# Patient Record
Sex: Female | Born: 1945 | Race: White | Hispanic: No | Smoking: Former smoker
Health system: Southern US, Community
[De-identification: ages and names within clinical notes are randomized; demographics above are authoritative.]

## PROBLEM LIST (undated history)

## (undated) DIAGNOSIS — Z9289 Personal history of other medical treatment: Secondary | ICD-10-CM

## (undated) DIAGNOSIS — I779 Disorder of arteries and arterioles, unspecified: Secondary | ICD-10-CM

## (undated) DIAGNOSIS — M1990 Unspecified osteoarthritis, unspecified site: Secondary | ICD-10-CM

## (undated) DIAGNOSIS — E039 Hypothyroidism, unspecified: Secondary | ICD-10-CM

## (undated) DIAGNOSIS — G4700 Insomnia, unspecified: Secondary | ICD-10-CM

## (undated) DIAGNOSIS — IMO0002 Reserved for concepts with insufficient information to code with codable children: Secondary | ICD-10-CM

## (undated) DIAGNOSIS — E782 Mixed hyperlipidemia: Secondary | ICD-10-CM

## (undated) DIAGNOSIS — I472 Ventricular tachycardia, unspecified: Secondary | ICD-10-CM

## (undated) DIAGNOSIS — F419 Anxiety disorder, unspecified: Secondary | ICD-10-CM

## (undated) DIAGNOSIS — G2581 Restless legs syndrome: Secondary | ICD-10-CM

## (undated) DIAGNOSIS — R55 Syncope and collapse: Secondary | ICD-10-CM

## (undated) DIAGNOSIS — I739 Peripheral vascular disease, unspecified: Secondary | ICD-10-CM

## (undated) DIAGNOSIS — N183 Chronic kidney disease, stage 3 unspecified: Secondary | ICD-10-CM

## (undated) DIAGNOSIS — I639 Cerebral infarction, unspecified: Secondary | ICD-10-CM

## (undated) DIAGNOSIS — I214 Non-ST elevation (NSTEMI) myocardial infarction: Secondary | ICD-10-CM

## (undated) DIAGNOSIS — F329 Major depressive disorder, single episode, unspecified: Secondary | ICD-10-CM

## (undated) DIAGNOSIS — K759 Inflammatory liver disease, unspecified: Secondary | ICD-10-CM

## (undated) DIAGNOSIS — R001 Bradycardia, unspecified: Secondary | ICD-10-CM

## (undated) DIAGNOSIS — F32A Depression, unspecified: Secondary | ICD-10-CM

## (undated) DIAGNOSIS — I4729 Other ventricular tachycardia: Secondary | ICD-10-CM

## (undated) DIAGNOSIS — H9190 Unspecified hearing loss, unspecified ear: Secondary | ICD-10-CM

## (undated) DIAGNOSIS — J449 Chronic obstructive pulmonary disease, unspecified: Secondary | ICD-10-CM

## (undated) DIAGNOSIS — M810 Age-related osteoporosis without current pathological fracture: Secondary | ICD-10-CM

## (undated) DIAGNOSIS — H269 Unspecified cataract: Secondary | ICD-10-CM

## (undated) DIAGNOSIS — I2510 Atherosclerotic heart disease of native coronary artery without angina pectoris: Secondary | ICD-10-CM

## (undated) DIAGNOSIS — Z950 Presence of cardiac pacemaker: Secondary | ICD-10-CM

## (undated) DIAGNOSIS — K219 Gastro-esophageal reflux disease without esophagitis: Secondary | ICD-10-CM

## (undated) DIAGNOSIS — J189 Pneumonia, unspecified organism: Secondary | ICD-10-CM

## (undated) DIAGNOSIS — H3530 Unspecified macular degeneration: Secondary | ICD-10-CM

## (undated) DIAGNOSIS — C539 Malignant neoplasm of cervix uteri, unspecified: Secondary | ICD-10-CM

## (undated) HISTORY — PX: ANTERIOR CERVICAL DECOMP/DISCECTOMY FUSION: SHX1161

## (undated) HISTORY — DX: Reserved for concepts with insufficient information to code with codable children: IMO0002

## (undated) HISTORY — DX: Hypothyroidism, unspecified: E03.9

## (undated) HISTORY — DX: Restless legs syndrome: G25.81

## (undated) HISTORY — DX: Insomnia, unspecified: G47.00

## (undated) HISTORY — DX: Chronic kidney disease, stage 3 unspecified: N18.30

## (undated) HISTORY — DX: Cerebral infarction, unspecified: I63.9

## (undated) HISTORY — DX: Major depressive disorder, single episode, unspecified: F32.9

## (undated) HISTORY — DX: Chronic obstructive pulmonary disease, unspecified: J44.9

## (undated) HISTORY — PX: COLONOSCOPY: SHX174

## (undated) HISTORY — PX: LAPAROSCOPIC CHOLECYSTECTOMY: SUR755

## (undated) HISTORY — DX: Age-related osteoporosis without current pathological fracture: M81.0

## (undated) HISTORY — DX: Chronic kidney disease, stage 3 (moderate): N18.3

## (undated) HISTORY — DX: Unspecified cataract: H26.9

## (undated) HISTORY — DX: Gastro-esophageal reflux disease without esophagitis: K21.9

## (undated) HISTORY — DX: Syncope and collapse: R55

## (undated) HISTORY — DX: Atherosclerotic heart disease of native coronary artery without angina pectoris: I25.10

## (undated) HISTORY — DX: Mixed hyperlipidemia: E78.2

## (undated) HISTORY — DX: Unspecified hearing loss, unspecified ear: H91.90

## (undated) HISTORY — PX: KNEE ARTHROSCOPY: SUR90

## (undated) HISTORY — PX: BACK SURGERY: SHX140

## (undated) HISTORY — DX: Other ventricular tachycardia: I47.29

## (undated) HISTORY — DX: Anxiety disorder, unspecified: F41.9

## (undated) HISTORY — PX: TUBAL LIGATION: SHX77

## (undated) HISTORY — DX: Ventricular tachycardia, unspecified: I47.20

## (undated) HISTORY — PX: ABDOMINAL HYSTERECTOMY: SHX81

## (undated) HISTORY — DX: Unspecified macular degeneration: H35.30

## (undated) HISTORY — DX: Ventricular tachycardia: I47.2

## (undated) HISTORY — DX: Depression, unspecified: F32.A

---

## 1966-09-27 DIAGNOSIS — K759 Inflammatory liver disease, unspecified: Secondary | ICD-10-CM

## 1966-09-27 HISTORY — DX: Inflammatory liver disease, unspecified: K75.9

## 1998-02-04 ENCOUNTER — Other Ambulatory Visit: Admission: RE | Admit: 1998-02-04 | Discharge: 1998-02-04 | Payer: Self-pay | Admitting: Obstetrics and Gynecology

## 1998-07-15 ENCOUNTER — Inpatient Hospital Stay (HOSPITAL_COMMUNITY): Admission: RE | Admit: 1998-07-15 | Discharge: 1998-07-16 | Payer: Self-pay | Admitting: Orthopaedic Surgery

## 1999-02-06 ENCOUNTER — Other Ambulatory Visit: Admission: RE | Admit: 1999-02-06 | Discharge: 1999-02-06 | Payer: Self-pay | Admitting: Obstetrics and Gynecology

## 2000-05-31 ENCOUNTER — Other Ambulatory Visit: Admission: RE | Admit: 2000-05-31 | Discharge: 2000-05-31 | Payer: Self-pay | Admitting: Obstetrics and Gynecology

## 2001-04-24 ENCOUNTER — Inpatient Hospital Stay (HOSPITAL_COMMUNITY): Admission: RE | Admit: 2001-04-24 | Discharge: 2001-04-25 | Payer: Self-pay | Admitting: Orthopaedic Surgery

## 2002-09-04 ENCOUNTER — Encounter: Admission: RE | Admit: 2002-09-04 | Discharge: 2002-09-04 | Payer: Self-pay | Admitting: Orthopaedic Surgery

## 2003-03-18 ENCOUNTER — Ambulatory Visit (HOSPITAL_BASED_OUTPATIENT_CLINIC_OR_DEPARTMENT_OTHER): Admission: RE | Admit: 2003-03-18 | Discharge: 2003-03-18 | Payer: Self-pay | Admitting: Orthopedic Surgery

## 2004-03-09 ENCOUNTER — Ambulatory Visit (HOSPITAL_COMMUNITY): Admission: RE | Admit: 2004-03-09 | Discharge: 2004-03-09 | Payer: Self-pay | Admitting: Orthopedic Surgery

## 2004-03-09 ENCOUNTER — Ambulatory Visit (HOSPITAL_BASED_OUTPATIENT_CLINIC_OR_DEPARTMENT_OTHER): Admission: RE | Admit: 2004-03-09 | Discharge: 2004-03-09 | Payer: Self-pay | Admitting: Orthopedic Surgery

## 2004-07-10 ENCOUNTER — Ambulatory Visit (HOSPITAL_COMMUNITY): Admission: RE | Admit: 2004-07-10 | Discharge: 2004-07-10 | Payer: Self-pay | Admitting: Family Medicine

## 2004-11-21 ENCOUNTER — Emergency Department (HOSPITAL_COMMUNITY): Admission: EM | Admit: 2004-11-21 | Discharge: 2004-11-21 | Payer: Self-pay | Admitting: Emergency Medicine

## 2004-11-23 ENCOUNTER — Ambulatory Visit: Payer: Self-pay | Admitting: Orthopedic Surgery

## 2005-05-10 ENCOUNTER — Ambulatory Visit (HOSPITAL_COMMUNITY): Admission: RE | Admit: 2005-05-10 | Discharge: 2005-05-11 | Payer: Self-pay | Admitting: Orthopaedic Surgery

## 2006-08-27 HISTORY — PX: CORONARY ANGIOPLASTY WITH STENT PLACEMENT: SHX49

## 2006-09-18 ENCOUNTER — Inpatient Hospital Stay (HOSPITAL_COMMUNITY): Admission: EM | Admit: 2006-09-18 | Discharge: 2006-09-21 | Payer: Self-pay | Admitting: Internal Medicine

## 2006-09-18 ENCOUNTER — Ambulatory Visit: Payer: Self-pay | Admitting: *Deleted

## 2006-09-23 ENCOUNTER — Ambulatory Visit: Payer: Self-pay | Admitting: Cardiology

## 2006-09-27 HISTORY — PX: CARDIAC CATHETERIZATION: SHX172

## 2006-10-04 ENCOUNTER — Encounter (INDEPENDENT_AMBULATORY_CARE_PROVIDER_SITE_OTHER): Payer: Self-pay | Admitting: Family Medicine

## 2006-10-10 ENCOUNTER — Ambulatory Visit: Payer: Self-pay | Admitting: Family Medicine

## 2006-10-11 ENCOUNTER — Encounter: Payer: Self-pay | Admitting: Family Medicine

## 2006-10-11 ENCOUNTER — Ambulatory Visit: Payer: Self-pay | Admitting: Cardiology

## 2006-10-11 DIAGNOSIS — F32A Depression, unspecified: Secondary | ICD-10-CM | POA: Insufficient documentation

## 2006-10-11 DIAGNOSIS — C539 Malignant neoplasm of cervix uteri, unspecified: Secondary | ICD-10-CM | POA: Insufficient documentation

## 2006-10-11 DIAGNOSIS — M1990 Unspecified osteoarthritis, unspecified site: Secondary | ICD-10-CM | POA: Insufficient documentation

## 2006-10-11 DIAGNOSIS — F329 Major depressive disorder, single episode, unspecified: Secondary | ICD-10-CM

## 2006-10-11 DIAGNOSIS — M545 Low back pain: Secondary | ICD-10-CM | POA: Insufficient documentation

## 2006-10-11 DIAGNOSIS — E785 Hyperlipidemia, unspecified: Secondary | ICD-10-CM | POA: Insufficient documentation

## 2006-10-11 DIAGNOSIS — F419 Anxiety disorder, unspecified: Secondary | ICD-10-CM

## 2006-10-11 DIAGNOSIS — I252 Old myocardial infarction: Secondary | ICD-10-CM

## 2006-10-11 DIAGNOSIS — I10 Essential (primary) hypertension: Secondary | ICD-10-CM

## 2006-10-11 DIAGNOSIS — K219 Gastro-esophageal reflux disease without esophagitis: Secondary | ICD-10-CM | POA: Insufficient documentation

## 2006-10-11 LAB — CONVERTED CEMR LAB
BUN: 8 mg/dL (ref 6–23)
CO2: 32 meq/L (ref 19–32)
Calcium: 9.4 mg/dL (ref 8.4–10.5)
Chloride: 99 meq/L (ref 96–112)
Creatinine, Ser: 0.9 mg/dL (ref 0.4–1.2)
Potassium: 4.5 meq/L (ref 3.5–5.1)

## 2006-10-13 ENCOUNTER — Encounter (INDEPENDENT_AMBULATORY_CARE_PROVIDER_SITE_OTHER): Payer: Self-pay | Admitting: Family Medicine

## 2006-10-28 HISTORY — PX: CORONARY ARTERY BYPASS GRAFT: SHX141

## 2006-10-29 DIAGNOSIS — Z9289 Personal history of other medical treatment: Secondary | ICD-10-CM

## 2006-10-29 HISTORY — DX: Personal history of other medical treatment: Z92.89

## 2006-11-04 ENCOUNTER — Ambulatory Visit: Payer: Self-pay | Admitting: Family Medicine

## 2006-11-04 ENCOUNTER — Telehealth (INDEPENDENT_AMBULATORY_CARE_PROVIDER_SITE_OTHER): Payer: Self-pay | Admitting: Family Medicine

## 2006-11-04 DIAGNOSIS — R5383 Other fatigue: Secondary | ICD-10-CM

## 2006-11-04 DIAGNOSIS — R5381 Other malaise: Secondary | ICD-10-CM | POA: Insufficient documentation

## 2006-11-04 DIAGNOSIS — H1130 Conjunctival hemorrhage, unspecified eye: Secondary | ICD-10-CM | POA: Insufficient documentation

## 2006-11-05 ENCOUNTER — Encounter (INDEPENDENT_AMBULATORY_CARE_PROVIDER_SITE_OTHER): Payer: Self-pay | Admitting: Family Medicine

## 2006-11-09 LAB — CONVERTED CEMR LAB
AST: 19 units/L (ref 0–37)
Albumin: 4.1 g/dL (ref 3.5–5.2)
Alkaline Phosphatase: 155 units/L — ABNORMAL HIGH (ref 39–117)
BUN: 16 mg/dL (ref 6–23)
Basophils Relative: 0 % (ref 0–1)
Creatinine, Ser: 0.96 mg/dL (ref 0.40–1.20)
Eosinophils Absolute: 0.1 10*3/uL (ref 0.0–0.7)
Eosinophils Relative: 2 % (ref 0–5)
Glucose, Bld: 115 mg/dL — ABNORMAL HIGH (ref 70–99)
HCT: 40.8 % (ref 36.0–46.0)
Hemoglobin: 14.1 g/dL (ref 12.0–15.0)
Lymphs Abs: 2.2 10*3/uL (ref 0.7–3.3)
MCHC: 34.6 g/dL (ref 30.0–36.0)
MCV: 87 fL (ref 78.0–100.0)
Monocytes Absolute: 0.6 10*3/uL (ref 0.2–0.7)
Monocytes Relative: 9 % (ref 3–11)
Potassium: 4.1 meq/L (ref 3.5–5.3)
RBC: 4.69 M/uL (ref 3.87–5.11)
WBC: 6.7 10*3/uL (ref 4.0–10.5)

## 2006-11-11 ENCOUNTER — Ambulatory Visit: Payer: Self-pay | Admitting: Family Medicine

## 2006-11-11 DIAGNOSIS — R748 Abnormal levels of other serum enzymes: Secondary | ICD-10-CM | POA: Insufficient documentation

## 2006-11-17 ENCOUNTER — Ambulatory Visit: Payer: Self-pay | Admitting: Cardiology

## 2006-11-17 ENCOUNTER — Inpatient Hospital Stay (HOSPITAL_COMMUNITY): Admission: AD | Admit: 2006-11-17 | Discharge: 2006-12-12 | Payer: Self-pay | Admitting: Cardiology

## 2006-11-17 ENCOUNTER — Ambulatory Visit: Payer: Self-pay | Admitting: Pulmonary Disease

## 2006-11-18 ENCOUNTER — Ambulatory Visit: Payer: Self-pay | Admitting: Cardiothoracic Surgery

## 2006-11-22 ENCOUNTER — Ambulatory Visit: Payer: Self-pay | Admitting: Oncology

## 2006-11-23 ENCOUNTER — Telehealth (INDEPENDENT_AMBULATORY_CARE_PROVIDER_SITE_OTHER): Payer: Self-pay | Admitting: Family Medicine

## 2006-11-30 ENCOUNTER — Encounter: Payer: Self-pay | Admitting: Cardiology

## 2006-12-23 ENCOUNTER — Ambulatory Visit: Payer: Self-pay | Admitting: Cardiology

## 2006-12-30 ENCOUNTER — Encounter (INDEPENDENT_AMBULATORY_CARE_PROVIDER_SITE_OTHER): Payer: Self-pay | Admitting: Family Medicine

## 2006-12-30 ENCOUNTER — Ambulatory Visit: Payer: Self-pay | Admitting: Cardiothoracic Surgery

## 2007-01-02 ENCOUNTER — Encounter (HOSPITAL_COMMUNITY): Admission: RE | Admit: 2007-01-02 | Discharge: 2007-02-01 | Payer: Self-pay | Admitting: Cardiology

## 2007-01-12 ENCOUNTER — Ambulatory Visit: Payer: Self-pay | Admitting: Cardiology

## 2007-01-13 ENCOUNTER — Ambulatory Visit: Payer: Self-pay | Admitting: Family Medicine

## 2007-01-13 DIAGNOSIS — M899 Disorder of bone, unspecified: Secondary | ICD-10-CM | POA: Insufficient documentation

## 2007-01-13 DIAGNOSIS — M949 Disorder of cartilage, unspecified: Secondary | ICD-10-CM

## 2007-01-13 LAB — CONVERTED CEMR LAB
Cholesterol, target level: 200 mg/dL
HDL goal, serum: 40 mg/dL
LDL Goal: 100 mg/dL

## 2007-01-16 ENCOUNTER — Encounter (INDEPENDENT_AMBULATORY_CARE_PROVIDER_SITE_OTHER): Payer: Self-pay | Admitting: Family Medicine

## 2007-01-18 LAB — CONVERTED CEMR LAB
Alkaline Phosphatase: 164 units/L — ABNORMAL HIGH (ref 39–117)
BUN: 13 mg/dL (ref 6–23)
Creatinine, Ser: 0.82 mg/dL (ref 0.40–1.20)
Eosinophils Absolute: 0.3 10*3/uL (ref 0.0–0.7)
Eosinophils Relative: 4 % (ref 0–5)
Glucose, Bld: 77 mg/dL (ref 70–99)
HDL: 65 mg/dL (ref >39)
Hemoglobin: 13.1 g/dL (ref 12.0–15.0)
LDL Cholesterol: 66 mg/dL (ref 0–99)
Lymphs Abs: 2.6 10*3/uL (ref 0.7–3.3)
MCHC: 31.4 g/dL (ref 30.0–36.0)
Monocytes Absolute: 0.8 10*3/uL — ABNORMAL HIGH (ref 0.2–0.7)
Monocytes Relative: 13 % — ABNORMAL HIGH (ref 3–11)
RBC: 4.65 M/uL (ref 3.87–5.11)
RDW: 16.3 % — ABNORMAL HIGH (ref 11.5–14.0)
Sodium: 144 meq/L (ref 135–145)
Total Bilirubin: 0.3 mg/dL (ref 0.3–1.2)
Total CHOL/HDL Ratio: 2.3
Triglycerides: 94 mg/dL (ref <150)
VLDL: 19 mg/dL (ref 0–40)

## 2007-01-20 ENCOUNTER — Encounter (INDEPENDENT_AMBULATORY_CARE_PROVIDER_SITE_OTHER): Payer: Self-pay | Admitting: Family Medicine

## 2007-01-20 ENCOUNTER — Ambulatory Visit (HOSPITAL_COMMUNITY): Admission: RE | Admit: 2007-01-20 | Discharge: 2007-01-20 | Payer: Self-pay | Admitting: Family Medicine

## 2007-01-26 ENCOUNTER — Ambulatory Visit: Payer: Self-pay | Admitting: Cardiothoracic Surgery

## 2007-01-27 ENCOUNTER — Ambulatory Visit: Payer: Self-pay | Admitting: Family Medicine

## 2007-01-27 DIAGNOSIS — G2581 Restless legs syndrome: Secondary | ICD-10-CM

## 2007-02-02 ENCOUNTER — Encounter (HOSPITAL_COMMUNITY): Admission: RE | Admit: 2007-02-02 | Discharge: 2007-03-04 | Payer: Self-pay | Admitting: Cardiology

## 2007-02-02 ENCOUNTER — Telehealth (INDEPENDENT_AMBULATORY_CARE_PROVIDER_SITE_OTHER): Payer: Self-pay | Admitting: Family Medicine

## 2007-02-08 ENCOUNTER — Inpatient Hospital Stay (HOSPITAL_COMMUNITY): Admission: EM | Admit: 2007-02-08 | Discharge: 2007-02-10 | Payer: Self-pay | Admitting: Emergency Medicine

## 2007-02-08 ENCOUNTER — Ambulatory Visit: Payer: Self-pay

## 2007-02-08 ENCOUNTER — Ambulatory Visit: Payer: Self-pay | Admitting: *Deleted

## 2007-02-09 ENCOUNTER — Encounter (INDEPENDENT_AMBULATORY_CARE_PROVIDER_SITE_OTHER): Payer: Self-pay | Admitting: Family Medicine

## 2007-02-09 ENCOUNTER — Encounter (INDEPENDENT_AMBULATORY_CARE_PROVIDER_SITE_OTHER): Payer: Self-pay | Admitting: Emergency Medicine

## 2007-02-09 ENCOUNTER — Encounter: Payer: Self-pay | Admitting: Cardiology

## 2007-02-13 ENCOUNTER — Ambulatory Visit: Payer: Self-pay | Admitting: Family Medicine

## 2007-02-13 DIAGNOSIS — G4700 Insomnia, unspecified: Secondary | ICD-10-CM

## 2007-02-13 DIAGNOSIS — I6529 Occlusion and stenosis of unspecified carotid artery: Secondary | ICD-10-CM

## 2007-02-13 DIAGNOSIS — G459 Transient cerebral ischemic attack, unspecified: Secondary | ICD-10-CM | POA: Insufficient documentation

## 2007-02-14 ENCOUNTER — Ambulatory Visit (HOSPITAL_COMMUNITY): Admission: RE | Admit: 2007-02-14 | Discharge: 2007-02-15 | Payer: Self-pay | Admitting: Interventional Radiology

## 2007-02-14 HISTORY — PX: CEREBRAL ANGIOGRAM: SHX1326

## 2007-02-28 ENCOUNTER — Encounter: Payer: Self-pay | Admitting: Interventional Radiology

## 2007-03-02 ENCOUNTER — Encounter (INDEPENDENT_AMBULATORY_CARE_PROVIDER_SITE_OTHER): Payer: Self-pay | Admitting: Family Medicine

## 2007-03-02 ENCOUNTER — Ambulatory Visit: Payer: Self-pay | Admitting: Cardiology

## 2007-03-03 ENCOUNTER — Ambulatory Visit (HOSPITAL_COMMUNITY): Admission: RE | Admit: 2007-03-03 | Discharge: 2007-03-03 | Payer: Self-pay | Admitting: Cardiology

## 2007-03-09 ENCOUNTER — Ambulatory Visit: Payer: Self-pay | Admitting: Family Medicine

## 2007-03-15 ENCOUNTER — Ambulatory Visit: Payer: Self-pay | Admitting: Family Medicine

## 2007-03-16 ENCOUNTER — Ambulatory Visit: Payer: Self-pay | Admitting: Cardiology

## 2007-03-17 ENCOUNTER — Telehealth (INDEPENDENT_AMBULATORY_CARE_PROVIDER_SITE_OTHER): Payer: Self-pay | Admitting: Family Medicine

## 2007-03-20 ENCOUNTER — Telehealth (INDEPENDENT_AMBULATORY_CARE_PROVIDER_SITE_OTHER): Payer: Self-pay | Admitting: Family Medicine

## 2007-03-20 ENCOUNTER — Ambulatory Visit: Payer: Self-pay | Admitting: Family Medicine

## 2007-03-30 ENCOUNTER — Encounter (INDEPENDENT_AMBULATORY_CARE_PROVIDER_SITE_OTHER): Payer: Self-pay | Admitting: Family Medicine

## 2007-04-03 ENCOUNTER — Encounter (INDEPENDENT_AMBULATORY_CARE_PROVIDER_SITE_OTHER): Payer: Self-pay | Admitting: Family Medicine

## 2007-04-11 ENCOUNTER — Encounter (INDEPENDENT_AMBULATORY_CARE_PROVIDER_SITE_OTHER): Payer: Self-pay | Admitting: Family Medicine

## 2007-04-27 ENCOUNTER — Telehealth (INDEPENDENT_AMBULATORY_CARE_PROVIDER_SITE_OTHER): Payer: Self-pay | Admitting: Family Medicine

## 2007-05-10 ENCOUNTER — Ambulatory Visit: Payer: Self-pay | Admitting: Cardiology

## 2007-05-22 ENCOUNTER — Ambulatory Visit: Payer: Self-pay | Admitting: Family Medicine

## 2007-06-02 ENCOUNTER — Ambulatory Visit (HOSPITAL_COMMUNITY): Admission: RE | Admit: 2007-06-02 | Discharge: 2007-06-02 | Payer: Self-pay | Admitting: Interventional Radiology

## 2007-06-23 ENCOUNTER — Ambulatory Visit: Payer: Self-pay | Admitting: Family Medicine

## 2007-06-26 ENCOUNTER — Ambulatory Visit (HOSPITAL_COMMUNITY): Admission: RE | Admit: 2007-06-26 | Discharge: 2007-06-26 | Payer: Self-pay | Admitting: Family Medicine

## 2007-06-26 ENCOUNTER — Telehealth (INDEPENDENT_AMBULATORY_CARE_PROVIDER_SITE_OTHER): Payer: Self-pay | Admitting: *Deleted

## 2007-06-26 ENCOUNTER — Encounter (INDEPENDENT_AMBULATORY_CARE_PROVIDER_SITE_OTHER): Payer: Self-pay | Admitting: Family Medicine

## 2007-06-27 ENCOUNTER — Ambulatory Visit: Payer: Self-pay | Admitting: Family Medicine

## 2007-06-28 ENCOUNTER — Telehealth (INDEPENDENT_AMBULATORY_CARE_PROVIDER_SITE_OTHER): Payer: Self-pay | Admitting: Family Medicine

## 2007-07-21 ENCOUNTER — Telehealth (INDEPENDENT_AMBULATORY_CARE_PROVIDER_SITE_OTHER): Payer: Self-pay | Admitting: Family Medicine

## 2007-07-27 ENCOUNTER — Telehealth (INDEPENDENT_AMBULATORY_CARE_PROVIDER_SITE_OTHER): Payer: Self-pay | Admitting: Family Medicine

## 2007-08-17 ENCOUNTER — Ambulatory Visit: Payer: Self-pay | Admitting: Family Medicine

## 2007-08-18 ENCOUNTER — Encounter (INDEPENDENT_AMBULATORY_CARE_PROVIDER_SITE_OTHER): Payer: Self-pay | Admitting: Family Medicine

## 2007-08-21 LAB — CONVERTED CEMR LAB
AST: 24 units/L (ref 0–37)
Albumin: 4.2 g/dL (ref 3.5–5.2)
Alkaline Phosphatase: 123 units/L — ABNORMAL HIGH (ref 39–117)
BUN: 14 mg/dL (ref 6–23)
Basophils Relative: 0 % (ref 0–1)
Eosinophils Absolute: 0.1 10*3/uL — ABNORMAL LOW (ref 0.2–0.7)
Eosinophils Relative: 2 % (ref 0–5)
HCT: 41.3 % (ref 36.0–46.0)
HDL: 68 mg/dL (ref >39)
LDL Cholesterol: 67 mg/dL (ref 0–99)
MCHC: 33.2 g/dL (ref 30.0–36.0)
MCV: 89.2 fL (ref 78.0–100.0)
Neutrophils Relative %: 61 % (ref 43–77)
Platelets: 265 10*3/uL (ref 150–400)
Potassium: 4.3 meq/L (ref 3.5–5.3)
Sodium: 141 meq/L (ref 135–145)
Total Bilirubin: 0.4 mg/dL (ref 0.3–1.2)
Total Protein: 6.9 g/dL (ref 6.0–8.3)
VLDL: 20 mg/dL (ref 0–40)

## 2007-08-22 ENCOUNTER — Encounter (INDEPENDENT_AMBULATORY_CARE_PROVIDER_SITE_OTHER): Payer: Self-pay | Admitting: Family Medicine

## 2007-08-23 ENCOUNTER — Ambulatory Visit: Payer: Self-pay | Admitting: Cardiology

## 2007-09-19 ENCOUNTER — Encounter (INDEPENDENT_AMBULATORY_CARE_PROVIDER_SITE_OTHER): Payer: Self-pay | Admitting: Family Medicine

## 2007-10-19 ENCOUNTER — Encounter (INDEPENDENT_AMBULATORY_CARE_PROVIDER_SITE_OTHER): Payer: Self-pay | Admitting: Family Medicine

## 2007-10-19 ENCOUNTER — Telehealth (INDEPENDENT_AMBULATORY_CARE_PROVIDER_SITE_OTHER): Payer: Self-pay | Admitting: *Deleted

## 2007-10-20 ENCOUNTER — Ambulatory Visit: Payer: Self-pay | Admitting: Family Medicine

## 2007-10-20 LAB — CONVERTED CEMR LAB
Basophils Absolute: 0 10*3/uL (ref 0.0–0.1)
CO2: 28 meq/L (ref 19–32)
Chloride: 105 meq/L (ref 96–112)
Eosinophils Relative: 1 % (ref 0–5)
Glucose, Bld: 83 mg/dL (ref 70–99)
HCT: 39.7 % (ref 36.0–46.0)
Lymphocytes Relative: 30 % (ref 12–46)
Lymphs Abs: 2.1 10*3/uL (ref 0.7–4.0)
Neutro Abs: 4 10*3/uL (ref 1.7–7.7)
Platelets: 306 10*3/uL (ref 150–400)
Potassium: 3.8 meq/L (ref 3.5–5.3)
RDW: 15.2 % (ref 11.5–15.5)
Sodium: 141 meq/L (ref 135–145)
WBC: 7 10*3/uL (ref 4.0–10.5)

## 2007-11-03 ENCOUNTER — Encounter (INDEPENDENT_AMBULATORY_CARE_PROVIDER_SITE_OTHER): Payer: Self-pay | Admitting: Family Medicine

## 2007-11-09 ENCOUNTER — Encounter (INDEPENDENT_AMBULATORY_CARE_PROVIDER_SITE_OTHER): Payer: Self-pay | Admitting: Family Medicine

## 2007-11-10 ENCOUNTER — Encounter (INDEPENDENT_AMBULATORY_CARE_PROVIDER_SITE_OTHER): Payer: Self-pay | Admitting: Family Medicine

## 2007-11-28 ENCOUNTER — Encounter (INDEPENDENT_AMBULATORY_CARE_PROVIDER_SITE_OTHER): Payer: Self-pay | Admitting: Family Medicine

## 2007-11-29 ENCOUNTER — Encounter (INDEPENDENT_AMBULATORY_CARE_PROVIDER_SITE_OTHER): Payer: Self-pay | Admitting: Family Medicine

## 2007-11-30 ENCOUNTER — Encounter (INDEPENDENT_AMBULATORY_CARE_PROVIDER_SITE_OTHER): Payer: Self-pay | Admitting: Family Medicine

## 2007-12-01 ENCOUNTER — Encounter (INDEPENDENT_AMBULATORY_CARE_PROVIDER_SITE_OTHER): Payer: Self-pay | Admitting: Family Medicine

## 2007-12-08 ENCOUNTER — Encounter (INDEPENDENT_AMBULATORY_CARE_PROVIDER_SITE_OTHER): Payer: Self-pay | Admitting: Family Medicine

## 2007-12-26 ENCOUNTER — Encounter (INDEPENDENT_AMBULATORY_CARE_PROVIDER_SITE_OTHER): Payer: Self-pay | Admitting: Family Medicine

## 2008-01-09 ENCOUNTER — Encounter (INDEPENDENT_AMBULATORY_CARE_PROVIDER_SITE_OTHER): Payer: Self-pay | Admitting: Family Medicine

## 2008-01-25 ENCOUNTER — Encounter (INDEPENDENT_AMBULATORY_CARE_PROVIDER_SITE_OTHER): Payer: Self-pay | Admitting: Family Medicine

## 2008-02-05 ENCOUNTER — Encounter (INDEPENDENT_AMBULATORY_CARE_PROVIDER_SITE_OTHER): Payer: Self-pay | Admitting: Family Medicine

## 2008-02-06 ENCOUNTER — Encounter (INDEPENDENT_AMBULATORY_CARE_PROVIDER_SITE_OTHER): Payer: Self-pay | Admitting: Family Medicine

## 2008-02-09 ENCOUNTER — Telehealth (INDEPENDENT_AMBULATORY_CARE_PROVIDER_SITE_OTHER): Payer: Self-pay | Admitting: *Deleted

## 2008-02-09 ENCOUNTER — Ambulatory Visit: Payer: Self-pay | Admitting: Family Medicine

## 2008-02-09 DIAGNOSIS — F17200 Nicotine dependence, unspecified, uncomplicated: Secondary | ICD-10-CM | POA: Insufficient documentation

## 2008-02-10 ENCOUNTER — Encounter (INDEPENDENT_AMBULATORY_CARE_PROVIDER_SITE_OTHER): Payer: Self-pay | Admitting: Family Medicine

## 2008-02-12 ENCOUNTER — Telehealth (INDEPENDENT_AMBULATORY_CARE_PROVIDER_SITE_OTHER): Payer: Self-pay | Admitting: *Deleted

## 2008-02-12 LAB — CONVERTED CEMR LAB
ALT: 29 units/L (ref 0–35)
AST: 23 units/L (ref 0–37)
BUN: 19 mg/dL (ref 6–23)
Basophils Absolute: 0 10*3/uL (ref 0.0–0.1)
Basophils Relative: 0 % (ref 0–1)
Calcium: 8.7 mg/dL (ref 8.4–10.5)
Chloride: 105 meq/L (ref 96–112)
Cholesterol: 170 mg/dL (ref 0–200)
Creatinine, Ser: 1.11 mg/dL (ref 0.40–1.20)
Eosinophils Relative: 1 % (ref 0–5)
HCT: 41.2 % (ref 36.0–46.0)
HDL: 86 mg/dL (ref >39)
Hemoglobin: 13 g/dL (ref 12.0–15.0)
MCHC: 31.6 g/dL (ref 30.0–36.0)
MCV: 89 fL (ref 78.0–100.0)
Monocytes Absolute: 0.5 10*3/uL (ref 0.1–1.0)
Monocytes Relative: 9 % (ref 3–12)
Neutro Abs: 2.9 10*3/uL (ref 1.7–7.7)
RBC: 4.63 M/uL (ref 3.87–5.11)
RDW: 15.1 % (ref 11.5–15.5)
Total Bilirubin: 0.4 mg/dL (ref 0.3–1.2)
Total CHOL/HDL Ratio: 2
VLDL: 17 mg/dL (ref 0–40)

## 2008-02-14 ENCOUNTER — Ambulatory Visit (HOSPITAL_COMMUNITY): Admission: RE | Admit: 2008-02-14 | Discharge: 2008-02-14 | Payer: Self-pay | Admitting: Interventional Radiology

## 2008-02-20 ENCOUNTER — Ambulatory Visit: Payer: Self-pay | Admitting: Family Medicine

## 2008-02-20 DIAGNOSIS — H3530 Unspecified macular degeneration: Secondary | ICD-10-CM | POA: Insufficient documentation

## 2008-02-21 ENCOUNTER — Telehealth (INDEPENDENT_AMBULATORY_CARE_PROVIDER_SITE_OTHER): Payer: Self-pay | Admitting: Family Medicine

## 2008-03-04 ENCOUNTER — Encounter (INDEPENDENT_AMBULATORY_CARE_PROVIDER_SITE_OTHER): Payer: Self-pay | Admitting: Family Medicine

## 2008-03-07 ENCOUNTER — Ambulatory Visit: Payer: Self-pay | Admitting: Cardiology

## 2008-03-07 ENCOUNTER — Telehealth (INDEPENDENT_AMBULATORY_CARE_PROVIDER_SITE_OTHER): Payer: Self-pay | Admitting: *Deleted

## 2008-03-19 ENCOUNTER — Ambulatory Visit: Payer: Self-pay | Admitting: Family Medicine

## 2008-03-20 ENCOUNTER — Ambulatory Visit (HOSPITAL_COMMUNITY): Admission: RE | Admit: 2008-03-20 | Discharge: 2008-03-20 | Payer: Self-pay | Admitting: Family Medicine

## 2008-03-21 ENCOUNTER — Encounter (INDEPENDENT_AMBULATORY_CARE_PROVIDER_SITE_OTHER): Payer: Self-pay | Admitting: Family Medicine

## 2008-04-18 ENCOUNTER — Ambulatory Visit: Payer: Self-pay | Admitting: Family Medicine

## 2008-05-07 ENCOUNTER — Ambulatory Visit: Payer: Self-pay | Admitting: Pulmonary Disease

## 2008-05-15 ENCOUNTER — Emergency Department (HOSPITAL_COMMUNITY): Admission: EM | Admit: 2008-05-15 | Discharge: 2008-05-15 | Payer: Self-pay | Admitting: Emergency Medicine

## 2008-05-16 ENCOUNTER — Telehealth (INDEPENDENT_AMBULATORY_CARE_PROVIDER_SITE_OTHER): Payer: Self-pay | Admitting: *Deleted

## 2008-05-20 ENCOUNTER — Ambulatory Visit: Payer: Self-pay | Admitting: Family Medicine

## 2008-05-24 ENCOUNTER — Encounter (INDEPENDENT_AMBULATORY_CARE_PROVIDER_SITE_OTHER): Payer: Self-pay | Admitting: Family Medicine

## 2008-06-18 ENCOUNTER — Ambulatory Visit: Payer: Self-pay | Admitting: Family Medicine

## 2008-08-19 ENCOUNTER — Ambulatory Visit: Payer: Self-pay | Admitting: Family Medicine

## 2008-08-19 DIAGNOSIS — R519 Headache, unspecified: Secondary | ICD-10-CM | POA: Insufficient documentation

## 2008-08-19 DIAGNOSIS — R51 Headache: Secondary | ICD-10-CM

## 2008-08-19 LAB — CONVERTED CEMR LAB

## 2008-08-20 ENCOUNTER — Encounter (INDEPENDENT_AMBULATORY_CARE_PROVIDER_SITE_OTHER): Payer: Self-pay | Admitting: Family Medicine

## 2008-08-20 LAB — CONVERTED CEMR LAB
ALT: 21 units/L (ref 0–35)
AST: 21 units/L (ref 0–37)
Alkaline Phosphatase: 123 units/L — ABNORMAL HIGH (ref 39–117)
Basophils Absolute: 0 10*3/uL (ref 0.0–0.1)
Eosinophils Absolute: 0.1 10*3/uL (ref 0.0–0.7)
Eosinophils Relative: 2 % (ref 0–5)
HCT: 40.4 % (ref 36.0–46.0)
MCV: 86 fL (ref 78.0–100.0)
Neutrophils Relative %: 62 % (ref 43–77)
Platelets: 209 10*3/uL (ref 150–400)
Potassium: 4.7 meq/L (ref 3.5–5.3)
RDW: 14.1 % (ref 11.5–15.5)
Sodium: 141 meq/L (ref 135–145)
Total Bilirubin: 0.3 mg/dL (ref 0.3–1.2)
Total Protein: 6.8 g/dL (ref 6.0–8.3)

## 2008-08-26 ENCOUNTER — Ambulatory Visit (HOSPITAL_COMMUNITY): Admission: RE | Admit: 2008-08-26 | Discharge: 2008-08-26 | Payer: Self-pay | Admitting: Family Medicine

## 2008-11-19 ENCOUNTER — Ambulatory Visit: Payer: Self-pay | Admitting: Family Medicine

## 2008-11-19 DIAGNOSIS — I209 Angina pectoris, unspecified: Secondary | ICD-10-CM

## 2008-11-19 DIAGNOSIS — J9611 Chronic respiratory failure with hypoxia: Secondary | ICD-10-CM | POA: Insufficient documentation

## 2008-11-19 DIAGNOSIS — R609 Edema, unspecified: Secondary | ICD-10-CM

## 2008-11-20 ENCOUNTER — Encounter (INDEPENDENT_AMBULATORY_CARE_PROVIDER_SITE_OTHER): Payer: Self-pay | Admitting: Family Medicine

## 2008-11-21 ENCOUNTER — Ambulatory Visit (HOSPITAL_COMMUNITY): Admission: RE | Admit: 2008-11-21 | Discharge: 2008-11-21 | Payer: Self-pay | Admitting: Family Medicine

## 2008-11-21 LAB — CONVERTED CEMR LAB
Alkaline Phosphatase: 124 units/L — ABNORMAL HIGH (ref 39–117)
Basophils Relative: 0 % (ref 0–1)
Eosinophils Absolute: 0.1 10*3/uL (ref 0.0–0.7)
Eosinophils Relative: 2 % (ref 0–5)
Glucose, Bld: 104 mg/dL — ABNORMAL HIGH (ref 70–99)
HCT: 36.9 % (ref 36.0–46.0)
HDL: 70 mg/dL (ref >39)
LDL Cholesterol: 61 mg/dL (ref 0–99)
Lymphs Abs: 1.4 10*3/uL (ref 0.7–4.0)
MCHC: 31.7 g/dL (ref 30.0–36.0)
MCV: 84.8 fL (ref 78.0–100.0)
Platelets: 185 10*3/uL (ref 150–400)
RDW: 14.3 % (ref 11.5–15.5)
Sodium: 143 meq/L (ref 135–145)
Total Bilirubin: 0.4 mg/dL (ref 0.3–1.2)
Total Protein: 6.7 g/dL (ref 6.0–8.3)
Triglycerides: 54 mg/dL (ref <150)
VLDL: 11 mg/dL (ref 0–40)
WBC: 5 10*3/uL (ref 4.0–10.5)

## 2008-11-28 ENCOUNTER — Ambulatory Visit: Payer: Self-pay | Admitting: Family Medicine

## 2008-11-28 DIAGNOSIS — D509 Iron deficiency anemia, unspecified: Secondary | ICD-10-CM

## 2008-11-29 LAB — CONVERTED CEMR LAB: UIBC: 438 ug/dL

## 2008-12-02 ENCOUNTER — Ambulatory Visit: Payer: Self-pay | Admitting: Family Medicine

## 2008-12-02 LAB — CONVERTED CEMR LAB: OCCULT 1: NEGATIVE

## 2008-12-03 ENCOUNTER — Encounter (INDEPENDENT_AMBULATORY_CARE_PROVIDER_SITE_OTHER): Payer: Self-pay | Admitting: *Deleted

## 2008-12-09 ENCOUNTER — Telehealth (INDEPENDENT_AMBULATORY_CARE_PROVIDER_SITE_OTHER): Payer: Self-pay | Admitting: *Deleted

## 2008-12-09 ENCOUNTER — Encounter (INDEPENDENT_AMBULATORY_CARE_PROVIDER_SITE_OTHER): Payer: Self-pay | Admitting: Family Medicine

## 2008-12-13 ENCOUNTER — Telehealth (INDEPENDENT_AMBULATORY_CARE_PROVIDER_SITE_OTHER): Payer: Self-pay | Admitting: Family Medicine

## 2008-12-16 ENCOUNTER — Telehealth (INDEPENDENT_AMBULATORY_CARE_PROVIDER_SITE_OTHER): Payer: Self-pay | Admitting: Family Medicine

## 2008-12-17 ENCOUNTER — Ambulatory Visit: Payer: Self-pay | Admitting: Family Medicine

## 2008-12-20 ENCOUNTER — Ambulatory Visit (HOSPITAL_COMMUNITY): Admission: RE | Admit: 2008-12-20 | Discharge: 2008-12-20 | Payer: Self-pay | Admitting: Family Medicine

## 2008-12-20 ENCOUNTER — Encounter (INDEPENDENT_AMBULATORY_CARE_PROVIDER_SITE_OTHER): Payer: Self-pay | Admitting: Family Medicine

## 2008-12-20 ENCOUNTER — Ambulatory Visit: Payer: Self-pay | Admitting: Cardiology

## 2008-12-31 ENCOUNTER — Ambulatory Visit: Payer: Self-pay | Admitting: Internal Medicine

## 2008-12-31 DIAGNOSIS — R197 Diarrhea, unspecified: Secondary | ICD-10-CM

## 2009-01-01 ENCOUNTER — Encounter: Payer: Self-pay | Admitting: Physician Assistant

## 2009-01-01 ENCOUNTER — Ambulatory Visit: Payer: Self-pay | Admitting: Cardiology

## 2009-01-01 ENCOUNTER — Encounter: Payer: Self-pay | Admitting: Gastroenterology

## 2009-01-03 ENCOUNTER — Ambulatory Visit (HOSPITAL_COMMUNITY): Admission: RE | Admit: 2009-01-03 | Discharge: 2009-01-03 | Payer: Self-pay | Admitting: Interventional Radiology

## 2009-01-09 ENCOUNTER — Ambulatory Visit (HOSPITAL_COMMUNITY): Admission: RE | Admit: 2009-01-09 | Discharge: 2009-01-09 | Payer: Self-pay | Admitting: Family Medicine

## 2009-01-09 ENCOUNTER — Encounter (INDEPENDENT_AMBULATORY_CARE_PROVIDER_SITE_OTHER): Payer: Self-pay | Admitting: Family Medicine

## 2009-01-16 ENCOUNTER — Ambulatory Visit (HOSPITAL_COMMUNITY): Admission: RE | Admit: 2009-01-16 | Discharge: 2009-01-16 | Payer: Self-pay | Admitting: Internal Medicine

## 2009-01-16 ENCOUNTER — Encounter: Payer: Self-pay | Admitting: Internal Medicine

## 2009-01-16 ENCOUNTER — Ambulatory Visit: Payer: Self-pay | Admitting: Internal Medicine

## 2009-01-21 ENCOUNTER — Ambulatory Visit: Payer: Self-pay | Admitting: Family Medicine

## 2009-01-21 DIAGNOSIS — K648 Other hemorrhoids: Secondary | ICD-10-CM | POA: Insufficient documentation

## 2009-01-21 DIAGNOSIS — K259 Gastric ulcer, unspecified as acute or chronic, without hemorrhage or perforation: Secondary | ICD-10-CM | POA: Insufficient documentation

## 2009-01-22 ENCOUNTER — Encounter: Payer: Self-pay | Admitting: Internal Medicine

## 2009-01-22 ENCOUNTER — Encounter (INDEPENDENT_AMBULATORY_CARE_PROVIDER_SITE_OTHER): Payer: Self-pay | Admitting: Family Medicine

## 2009-01-22 LAB — CONVERTED CEMR LAB
BUN: 13 mg/dL (ref 6–23)
CO2: 24 meq/L (ref 19–32)
Calcium: 8.7 mg/dL (ref 8.4–10.5)
Glucose, Bld: 97 mg/dL (ref 70–99)
Potassium: 4.4 meq/L (ref 3.5–5.3)
Sodium: 141 meq/L (ref 135–145)

## 2009-01-30 ENCOUNTER — Encounter (INDEPENDENT_AMBULATORY_CARE_PROVIDER_SITE_OTHER): Payer: Self-pay | Admitting: Family Medicine

## 2009-02-18 ENCOUNTER — Ambulatory Visit: Payer: Self-pay | Admitting: Family Medicine

## 2009-02-19 ENCOUNTER — Encounter (INDEPENDENT_AMBULATORY_CARE_PROVIDER_SITE_OTHER): Payer: Self-pay | Admitting: Family Medicine

## 2009-03-13 ENCOUNTER — Encounter (INDEPENDENT_AMBULATORY_CARE_PROVIDER_SITE_OTHER): Payer: Self-pay | Admitting: Family Medicine

## 2009-04-17 ENCOUNTER — Ambulatory Visit: Payer: Self-pay | Admitting: Family Medicine

## 2009-04-17 LAB — CONVERTED CEMR LAB

## 2009-04-18 ENCOUNTER — Encounter (INDEPENDENT_AMBULATORY_CARE_PROVIDER_SITE_OTHER): Payer: Self-pay | Admitting: Family Medicine

## 2009-04-18 LAB — CONVERTED CEMR LAB
ALT: 24 units/L (ref 0–35)
CO2: 20 meq/L (ref 19–32)
Calcium: 8.9 mg/dL (ref 8.4–10.5)
Chloride: 108 meq/L (ref 96–112)
HCT: 43.7 % (ref 36.0–46.0)
Hemoglobin: 14.5 g/dL (ref 12.0–15.0)
Lymphocytes Relative: 29 % (ref 12–46)
Lymphs Abs: 1.7 10*3/uL (ref 0.7–4.0)
MCHC: 33.2 g/dL (ref 30.0–36.0)
Monocytes Relative: 12 % (ref 3–12)
Neutro Abs: 3.4 10*3/uL (ref 1.7–7.7)
Neutrophils Relative %: 56 % (ref 43–77)
Platelets: 183 10*3/uL (ref 150–400)
Potassium: 4.5 meq/L (ref 3.5–5.3)
RDW: 15.6 % — ABNORMAL HIGH (ref 11.5–15.5)
Sodium: 143 meq/L (ref 135–145)
Total Protein: 6.8 g/dL (ref 6.0–8.3)

## 2009-05-22 ENCOUNTER — Encounter (INDEPENDENT_AMBULATORY_CARE_PROVIDER_SITE_OTHER): Payer: Self-pay | Admitting: Family Medicine

## 2009-05-29 ENCOUNTER — Telehealth (INDEPENDENT_AMBULATORY_CARE_PROVIDER_SITE_OTHER): Payer: Self-pay | Admitting: Family Medicine

## 2009-06-04 ENCOUNTER — Encounter (INDEPENDENT_AMBULATORY_CARE_PROVIDER_SITE_OTHER): Payer: Self-pay | Admitting: Family Medicine

## 2009-11-21 ENCOUNTER — Ambulatory Visit: Payer: Self-pay | Admitting: Cardiology

## 2009-11-21 DIAGNOSIS — I2510 Atherosclerotic heart disease of native coronary artery without angina pectoris: Secondary | ICD-10-CM

## 2010-01-13 ENCOUNTER — Encounter: Payer: Self-pay | Admitting: Cardiology

## 2010-02-05 ENCOUNTER — Encounter: Payer: Self-pay | Admitting: Internal Medicine

## 2010-10-27 NOTE — Assessment & Plan Note (Signed)
Summary: F1Y/ GD  Medications Added EMPTY CAPSULE SIZE 10 CLEAR  CAPS (GELATIN CAPSULES (EMPTY)) 4 tabs by mouth once daily ZOLOFT 100 MG TABS (SERTRALINE HCL) 2 tabs by mouth once daily        Primary Provider:  Franchot Heidelberg, MD   History of Present Illness: [name] is a  female who has a history of coronary artery disease.  She has had prior coronary artery bypass and graft in February 2008.  She also has had problems with chest pain since that time.  It is felt to be a musculoskeletal pain.  She has also had previous stenting of her intracranial left internal carotid artery by Dr. Corliss Skains.  She had an angiogram 4/10 in followup.  The stent was patent.  She also has a history of pulmonary nodules that have been followed by CT, and Dr. Erby Pian is now following this.  Last echocardiogram in March of 2010 and showed normal LV function, mild left atrial enlargement and mild tricuspid regurgitation. She was last seen in this office in April of 2010. Since then she has been diagnosed with COPD. She does have dyspnea on exertion relieved with rest. There is no associated chest pain. No orthopnea or PND but she does have mild pedal edema. She continues to have occasional chest pain typically under stressful situations. It is not exertional. It has been intermittent since her bypass surgery. It is unchanged in severity or frequency.  Current Medications (verified): 1)  Lipitor 20 Mg Tabs (Atorvastatin Calcium) .... Once Daily 2)  Metoprolol Tartrate 25 Mg Tabs (Metoprolol Tartrate) .... Two Times A Day 3)  Prilosec 20 Mg Cpdr (Omeprazole) .... Once Daily 4)  Aspir-Low 81 Mg Tbec (Aspirin) .... One Daily 5)  Mirapex 1 Mg Tabs (Pramipexole Dihydrochloride) .... One Nightly 6)  Plavix 75 Mg Tabs (Clopidogrel Bisulfate) .... One Daily 7)  Rozerem 8 Mg Tabs (Ramelteon) .... One At Bedtime 8)  Alprazolam 1 Mg  Tbdp (Alprazolam) .... One At Bedtime For Insomnia and One As Needed Daily For  Anxiety 9)  Zoloft 100 Mg  Tabs (Sertraline Hcl) .... Two At Bedtime 10)  Furosemide 20 Mg Tabs (Furosemide) .... Take 1 Tab By Mouth Every Day 11)  Ferrous Sulfate 325 (65 Fe) Mg Tbec (Ferrous Sulfate) .... One Daly 12)  Eql Folic Acid 400 Mcg Tabs (Folic Acid) .... Once Daily 13)  Cvs Stool Softener 100 Mg Caps (Docusate Sodium) .... Two Times A Day 14)  Spiriva Handihaler 18 Mcg Caps (Tiotropium Bromide Monohydrate) .... One Inh Daily 15)  Ventolin Hfa 108 (90 Base) Mcg/act Aers (Albuterol Sulfate) .... 2 Puffs Every 6 Hours. 16)  Symbicort 80-4.5 Mcg/act Aero (Budesonide-Formoterol Fumarate) .... Two Times A Day 17)  Nitrostat 0.4 Mg Subl (Nitroglycerin) .Marland Kitchen.. 1 Tablet Under Tongue At Onset of Chest Pain; You May Repeat Every 5 Minutes For Up To 3 Doses. 18)  Empty Capsule Size 10 Clear  Caps (Gelatin Capsules (Empty)) .... 4 Tabs By Mouth Once Daily 19)  Zoloft 100 Mg Tabs (Sertraline Hcl) .... 2 Tabs By Mouth Once Daily  Allergies: 1)  ! Codeine 2)  ! * Ivp Dye  Past History:  Past Medical History: MYOCARDIAL INFARCTION, HX OF (ICD-412) CAROTID ARTERY DISEASE (ICD-433.10) CHEST PAIN (ICD-786.50) HYPERLIPIDEMIA (ICD-272.4) HYPERTENSION (ICD-401.9) DIARRHEA, CHRONIC (ICD-787.91) IRON DEFICIENCY (ICD-280.9) MACULAR DEGENERATION (ICD-362.50) UNSPECIFIED HEARING LOSS - LEFT EAR (ICD-389.9) PULMONARY NODULE - REPEAT CT CHEST IN JUNE 2010 (ICD-518.89) INSOMNIA (ICD-780.52) TRANSIENT ISCHEMIC ATTACK (ICD-435.9) RESTLESS LEG SYNDROME (ICD-333.94) OSTEOPENIA (  ICD-733.90) ALKALINE PHOSPHATASE, ELEVATED (ICD-790.5) CERVICAL CANCER (ICD-180.9) OSTEOARTHRITIS (ICD-715.90) LOW BACK PAIN (ICD-724.2) GERD (ICD-530.81) DEPRESSION (ICD-311) ANXIETY (ICD-300.00) s/p MI 12/08 s/p stent.  MI 2/09 with occlusion of stent s/p CABG 4vessels.  Complicated by transfusion reaction with temporary renal and liver dysfunction. 5/09 - TIA s/p intracranial left ICA stent by Dr. Corliss Skains  Past  Surgical History: Hysterectomy 7 knee arthroscopies Stenting intracranial left internal carotid artery post TIA, 5/09 CABG 4 vessel - 2/08 LIMA to the LAD, saphenous vein graft sequentially to the obtuse marginal and circumflex and saphenous vein graft to the posterior lateral cholecystectomy cervical disc surgery X 3  Social History: Reviewed history from 12/31/2008 and no changes required. Marital Status: Married Children: 40 and 90 [employer] Occupation: Cardiology EKG tech - now retired. Patient states former smoker. Quit smoking 11/07.  smoked x 40 yrs less than 1ppd. Alcohol Use - no Illicit Drug Use - no  Review of Systems       She complains of chronic bilateral lower extremity pain left greater than right present continuously but no fevers or chills, productive cough, hemoptysis, dysphasia, odynophagia, melena, hematochezia, dysuria, hematuria, rash, seizure activity, orthopnea, PND, claudication. Remaining systems are negative.   Vital Signs:  Patient profile:   65 year old female Height:      64 inches Weight:      205 pounds BMI:     35.32 Pulse rate:   58 / minute Resp:     14 per minute BP sitting:   90 / 62  (left arm)  Vitals Entered By: Kem Parkinson (November 21, 2009 8:46 AM)  Physical Exam  General:  Well-developed obese in no acute distress.  Skin is warm and dry.  HEENT is normal.  Neck is supple. No thyromegaly.  Chest is clear to auscultation with normal expansion.  Cardiovascular exam is regular rate and rhythm.  Abdominal exam nontender or distended. No masses palpated. Extremities show trace edema. neuro grossly intact    EKG  Procedure date:  11/21/2009  Findings:      Normal sinus rhythm at a rate of 58. Axis normal. Lateral T-wave inversion.  Impression & Recommendations:  Problem # 1:  CAROTID ARTERY DISEASE (ICD-433.10) Continue aspirin, statin. This is followed by radiology. Her updated medication list for this problem  includes:    Aspir-low 81 Mg Tbec (Aspirin) ..... One daily    Plavix 75 Mg Tabs (Clopidogrel bisulfate) ..... One daily  Problem # 2:  COPD - MILD (ICD-496) management per primary care. Her updated medication list for this problem includes:    Spiriva Handihaler 18 Mcg Caps (Tiotropium bromide monohydrate) ..... One inh daily    Ventolin Hfa 108 (90 Base) Mcg/act Aers (Albuterol sulfate) .Marland Kitchen... 2 puffs every 6 hours.    Symbicort 80-4.5 Mcg/act Aero (Budesonide-formoterol fumarate) .Marland Kitchen..Marland Kitchen Two times a day  Problem # 3:  CHEST PAIN (ICD-786.50) Symptoms somewhat atypical. Will schedule Myoview for risk stratification. Her updated medication list for this problem includes:    Metoprolol Tartrate 25 Mg Tabs (Metoprolol tartrate) .Marland Kitchen..Marland Kitchen Two times a day    Aspir-low 81 Mg Tbec (Aspirin) ..... One daily    Plavix 75 Mg Tabs (Clopidogrel bisulfate) ..... One daily    Nitrostat 0.4 Mg Subl (Nitroglycerin) .Marland Kitchen... 1 tablet under tongue at onset of chest pain; you may repeat every 5 minutes for up to 3 doses.  Problem # 4:  HYPERLIPIDEMIA (ICD-272.4) Continue statin. Lipids and liver monitored by primary care. Her updated medication list  for this problem includes:    Lipitor 20 Mg Tabs (Atorvastatin calcium) ..... Once daily  Problem # 5:  HYPERTENSION (ICD-401.9) Blood pressure controlled on present medications. Will continue. Renal function monitored by primary care. Her updated medication list for this problem includes:    Metoprolol Tartrate 25 Mg Tabs (Metoprolol tartrate) .Marland Kitchen..Marland Kitchen Two times a day    Aspir-low 81 Mg Tbec (Aspirin) ..... One daily    Furosemide 20 Mg Tabs (Furosemide) .Marland Kitchen... Take 1 tab by mouth every day  Problem # 6:  LEG EDEMA (ICD-782.3) Continue diuretics. Suggested compression hose.  Problem # 7:  PULMONARY NODULE - REPEAT CT CHEST IN JUNE 2010 (ICD-518.89) This is followed by primary care. I asked her to review this with him at next visit.  Problem # 8:  RESTLESS LEG  SYNDROME (ICD-333.94)  Problem # 9:  GERD (ICD-530.81)  Her updated medication list for this problem includes:    Prilosec 20 Mg Cpdr (Omeprazole) ..... Once daily  Problem # 10:  CAD (ICD-414.00) Continue aspirin, beta blocker and statin. Her updated medication list for this problem includes:    Metoprolol Tartrate 25 Mg Tabs (Metoprolol tartrate) .Marland Kitchen..Marland Kitchen Two times a day    Aspir-low 81 Mg Tbec (Aspirin) ..... One daily    Plavix 75 Mg Tabs (Clopidogrel bisulfate) ..... One daily    Nitrostat 0.4 Mg Subl (Nitroglycerin) .Marland Kitchen... 1 tablet under tongue at onset of chest pain; you may repeat every 5 minutes for up to 3 doses.  Other Orders: Nuclear Stress Test (Nuc Stress Test)  Patient Instructions: 1)  Your physician recommends that you schedule a follow-up appointment in: ONE YEAR 2)  Your physician has requested that you have an exercise stress myoview.  For further information please visit [email or web address]  Please follow instruction sheet, as given.

## 2010-10-27 NOTE — Letter (Signed)
Summary: Internal Other  Internal Other   Imported By: Peggyann Shoals 02/05/2010 09:59:25  _____________________________________________________________________  External Attachment:    Type:   Image     Comment:   External Document

## 2010-10-27 NOTE — Progress Notes (Signed)
Summary: Patient's at Home Med List  Patient's at Home Med List   Imported By: Marylou Mccoy 01/13/2010 16:53:29  _____________________________________________________________________  External Attachment:    Type:   Image     Comment:   External Document

## 2011-01-06 LAB — BLOOD GAS, ARTERIAL
FIO2: 0.21 %
pCO2 arterial: 34 mmHg — ABNORMAL LOW (ref 35.0–45.0)
pH, Arterial: 7.458 — ABNORMAL HIGH (ref 7.350–7.400)
pO2, Arterial: 84.5 mmHg (ref 80.0–100.0)

## 2011-01-06 LAB — CBC
HCT: 41.3 % (ref 36.0–46.0)
Hemoglobin: 13.8 g/dL (ref 12.0–15.0)
MCV: 82.3 fL (ref 78.0–100.0)
Platelets: 202 10*3/uL (ref 150–400)
WBC: 4.6 10*3/uL (ref 4.0–10.5)

## 2011-01-06 LAB — BASIC METABOLIC PANEL
Chloride: 104 mEq/L (ref 96–112)
GFR calc Af Amer: >60 mL/min (ref >60)
Potassium: 4.2 mEq/L (ref 3.5–5.1)
Sodium: 138 mEq/L (ref 135–145)

## 2011-01-06 LAB — APTT: aPTT: 27 seconds (ref 24–37)

## 2011-01-06 LAB — TISSUE TRANSGLUTAMINASE, IGA: Tissue Transglutaminase Ab, IgA: 0 U/mL (ref <7)

## 2011-02-09 NOTE — H&P (Signed)
NAMESALAYA, HOLTROP              ACCOUNT NO.:  [id number]   MEDICAL RECORD NO.:  [id number]          PATIENT TYPE:  OUT   LOCATION:  XRAY                         FACILITY:  [facility]   PHYSICIAN:  Sanjeev K. Deveshwar, M.D.DATE OF BIRTH:  1946/01/15   DATE OF ADMISSION:  02/14/2007  DATE OF DISCHARGE:                              HISTORY & PHYSICAL   BRIEF ADDENDUM:  The cerebral angiogram as well as the PTA stenting  procedures were described to the patient and her husband in detail  during her most recent admission to [facility] by Dr.  Corliss Skains.  Please see his consult note in the patient's chart.  He will  also see the patient prior to proceeding today, and again the procedure  will be described in detail along with potential risks and benefits.      Delton See, P.A.    ______________________________  Grandville Silos. Corliss Skains, M.D.    DR/MEDQ  D:  02/14/2007  T:  02/14/2007  Job:  [id number]

## 2011-02-09 NOTE — Assessment & Plan Note (Signed)
[facility]                            CARDIOLOGY OFFICE NOTE   NAME:[name], [name] PETRAK                     MRN:          [id number]  DATE:03/07/2008                            DOB:          1946-09-13    [name] is a 65 year old female who has a history of coronary artery  disease.  She has had prior coronary artery bypass and graft in February  2008.  She also has had problems with chest pain since that time.  It is  felt to be a musculoskeletal pain.  She has also had previous stenting  of her intracranial left internal carotid artery by Dr. Corliss [name].  She  had an angiogram 2 weeks ago in followup.  The stent was patent.  She  also has a history of pulmonary nodules that have been followed by CT,  and Dr. Erby [name] is now following this.  She continues to have chest  pain.  The pain increases with lying flat and also with cough or  sneezing.  This has been present ever since her surgery.  She also has  some dyspnea on exertion.  There is no orthopnea, PND, or pedal edema.  She has not had syncope.   MEDICATIONS:  1. Lipitor 20 mg p.o. daily.  2. Lopressor 25 mg p.o. b.i.d.  3. Prilosec 20 mg p.o. daily.  4. Aspirin 81 mg p.o. daily.  5. Sertraline 200 mg p.o. daily.  6. Plavix 75 mg p.o. daily.  7. MiraLax.  8. __________  9. She takes Vicodin as needed as well as folate.  10.Stool softener.   Physical exam today shows a blood pressure of 125/66 and pulse is 57.  She weighs 186 pounds.  Her HEENT is normal.  Her neck is supple.  Her  chest is clear.  Cardiovascular exam reveals regular rate and rhythm.  Abdominal exam shows no tenderness.  Extremities show no edema.   Her electrocardiogram shows a sinus bradycardia at a rate of 52.  The  axis is normal.  A prior anterior infarct cannot be excluded.  Laboratories from Feb 10, 2008, showed hemoglobin and hematocrit of 13  and 41.  Her renal function was normal with BUN and creatinine of 19  and  1.11.  Her liver functions were normal.  Her total cholesterol was 170  with an HDL of 86 and an LDL 67.   DIAGNOSES:  1. Coronary artery disease, status post coronary artery bypass and      graft - she will continue on her aspirin, Plavix, Lipitor, and      Lopressor.  Note, the Plavix is being used for her previous      cerebrovascular stent.  2. Chronic chest pain - this is musculoskeletal in etiology (it hurts      worse when she coughs or sneezes and also lying flat).  We will not      pursue further ischemia evaluation.  3. History of stenting of her intracranial left internal carotid      artery - management per Dr. Corliss [name].  She will continue on her  aspirin and Plavix.  4. Hyperlipidemia - she will continue on her statin and her lipids and      liver were outstanding.  5. Hypertension - her blood pressure is adequately controlled on      present medications.  6. History of IVP dye allergy.  7. Abnormal chest CT - Dr. Erby [name] is now following this.  She is due      for a repeat CT.  8. History of anxiety.  9. Tobacco abuse, now resolved.   We will see her back in 12 months.     [name] [name] Som, MD, [name]  Electronically Signed    BSC/MedQ  DD: 03/07/2008  DT: 03/08/2008  Job #: [id number]   cc:   [name], M.D.

## 2011-02-09 NOTE — Assessment & Plan Note (Signed)
[facility]                            CARDIOLOGY OFFICE NOTE   NAME:[name], [name]                     MRN:          [id number]  DATE:03/02/2007                            DOB:          Mar 03, 1946    HISTORY OF PRESENT ILLNESS:  [name] is a 65 year old lady who  underwent stenting of the proximal and LAD in late 2007, and then  represented with progression of her disease proximal to the most  proximal LAD stent shortly thereafter.  She underwent 4-vessel CABG on  November 21, 2006.  Postprocedural course was complicated by acute liver  dysfunction, transfusion reaction, and renal dysfunction.  After a long  hospital course, everything recovered nicely.   Unfortunately, 2 weeks ago she suffered a TIA marked by dysphasia.  She  was found to have severe stenosis of the intracranial left ICA that was  stented by Dr. Corliss Skains.  She has had no recurrent TIA or symptoms  since then.   Over the past 12 days, [name] has had a very sharp left chest pain  lasting a few seconds, then going away.  She thinks it may be worse with  taking a deep breath.  It is not positional.  It comes and goes  independent of her activity.  It bothers her may times per hour.  She  knows of no precipitating factors other than taking a breath.  She has  not had any associated diaphoresis, nausea, or vomiting.  Pain is quite  distinct from her prior anginal pain.   CURRENT MEDICATIONS:  1. Lipitor 20 mg nightly.  2. Metoprolol 25 mg twice daily.  3. Prilosec 20 mg daily.  4. Aspirin 81 mg daily.  5. Plavix 75 mg daily.  6. Sertraline 200 mg daily.   PHYSICAL EXAMINATION:  She is generally well appearing, in no distress.  Heart rate is 61.  Blood pressure 108/64.  Weight of 157 pounds.  She has no jugular venous distention, thyromegaly, or lymphadenopathy.  LUNGS:  Clear to auscultation.  There is the distinct absence of a  pleural friction rub.  She has a  nondisplaced point of maximal cardiac impulse.  There is a  regular rate and rhythm without murmur, rub, or gallop.  ABDOMEN:  Soft, non-distended, non-tender.  There is no  hepatosplenomegaly.  Bowel sounds are normal.  EXTREMITIES:  Warm without clubbing, cyanosis, edema, or ulceration.   Electrocardiogram demonstrates normal sinus rhythm with a slight  rightward axis at 93 degrees, up only from 86 degrees.  It is without  significant change compared with Feb 08, 2007.   IMPRESSION/RECOMMENDATIONS:  Chest pain:  Symptoms extremely unlikely to  be cardiac.  Nonetheless, give her complex recent history, we will check  troponin.  Fortunately, the electrocardiogram is unchanged from  previous.  With the possible pleuritic nature to it, and recent  hospitalization, we might be concerned with pulmonary embolus.  She has  no peripheral edema, and no cords, or calf tenderness.  We will check a  D-dimer.  If negative, would not pursue further.  We will also check a  chest x-ray today.  We will contact her next week.  She has an  appointment already scheduled for March 16, 2007.     Salvadore Farber, MD  Electronically Signed    WED/MedQ  DD: 03/02/2007  DT: 03/02/2007  Job #: [id number]

## 2011-02-09 NOTE — Assessment & Plan Note (Signed)
[facility]                            CARDIOLOGY OFFICE NOTE   NAME:[name], [name]                     MRN:          [id number]  DATE:05/10/2007                            DOB:          19-May-1946    [name] is a 65 year old female who has previously been followed by  Dr. Samule Ohm.  She had a myocardial infarction in December and has a drug-  eluting stent to her LAD.  However, she had recurrent symptoms and a  catheterization in February showed restenosis.  She ultimately underwent  coronary artery bypass grafting with a LIMA to the LAD, sequential  saphenous vein graft to the obtuse marginal and distal circumflex, and a  saphenous vein graft to the posterior lateral.  Her course was  complicated by an acute hemolytic transfusion with coagulopathy and  liver dysfunction, as well as acute renal failure.  She has slowly  improved.  However, she suffered a TIA in late February and underwent  stenting of intracranial left internal carotid artery by Dr. Corliss Skains.  Since then, she has continued to have chest discomfort.  The pain has  been present ever since her coronary artery bypass grafting.  It  increases with coughing and sneezing.  She also states that it is worse  after she awakens in the morning and awakens from a nap.  She also has  some dyspnea on exertion, but has no orthopnea, PND, pedal edema, or  syncope.  Note, she discontinued her tobacco use in November of 2007.   MEDICATIONS:  1. Lipitor 20 mg p.o. daily.  2. Lopressor 25 mg p.o. b.i.d.  3. Prilosec 20 mg p.o. daily.  4. Aspirin 81 mg p.o. daily.  5. Sertraline.  6. Plavix 75 mg p.o. daily.  7. Mirapex.  8. Rozerem nightly.  9. She also takes coenzyme Q.   PHYSICAL EXAM:  Blood pressure 130/77 and her pulse is 55.  She weighs  161 pounds.  HEENT:  Normal.  NECK:  Supple.  There are no bruits noted.  CHEST:  Clear.  CARDIOVASCULAR:  Regular rate and rhythm.  Her sternotomy  is without  evidence of infection.  ABDOMEN:  Shows no pulsatile masses and no bruits.  EXTREMITIES:  Show no edema.   ELECTROCARDIOGRAM:  Shows a sinus rhythm at a rate of 51.  There are no  significant ST changes.   DIAGNOSES:  1. Coronary artery disease status post coronary artery bypass grafting      - she will continue on her aspirin, Statin, and Lopressor.  Note,      her left ventricular function was preserved preoperatively.  2. Chest pain - this appears to be musculoskeletal pain as it has been      present continuously since her bypass surgery.  I do not think we      need to pursue further ischemia evaluation.  Note, an      electrocardiogram shows no ST changes.  3. Recent transient ischemic attack - she did undergo stenting of her      intracranial left internal carotid artery by Dr. Corliss Skains, and  she      is scheduled to have a repeat angiogram.  4. Hyperlipidemia - we will continue with her Statin.  5. Hypertension - her blood pressure is well controlled on her present      medications.  6. History of IVP dye allergy.  7. Abnormal chest CT - her previous chest CT showed no pulmonary      embolus, but there were nodules noted.  She will have a followup      chest CT in December as scheduled.  8. History of anxiety.  9. Remote tobacco abuse - now resolved.   [name] will continue with risk factor modification.  I discussed  the importance of diet and exercise.  We will see her back in 4 months.     Madolyn Frieze Jens Som, MD, [facility]  Electronically Signed    BSC/MedQ  DD: 05/10/2007  DT: 05/11/2007  Job #: [id number]   cc:   Franchot Heidelberg, M.D.

## 2011-02-09 NOTE — Discharge Summary (Signed)
[name], [name]              ACCOUNT NO.:  [id number]   MEDICAL RECORD NO.:  [id number]          PATIENT TYPE:  INP   LOCATION:  6742                         FACILITY:  [facility]   PHYSICIAN:  Pramod P. Pearlean Brownie, MD    DATE OF BIRTH:  1946-06-16   DATE OF ADMISSION:  02/08/2007  DATE OF DISCHARGE:  02/10/2007                               DISCHARGE SUMMARY   DISCHARGE DIAGNOSES:  1. 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  2. No acute stroke.  3. Left macular degeneration.  4. Heart disease, status post myocardial infarction, stent and      coronary artery bypass graft.  5. Cervical disk disease.  6. Right carotid disease, 60-80%.   STUDIES PERFORMED:  1. MRI of the brain showed no acute abnormality.  2. MRA of the brain showed severe stenosis of the terminal left ICA.  3. MRA of the neck shows carotid bifurcation widely patent.  There is      severe stenosis in the terminal left ICA.  4. CT angiogram confirms terminal left ICA stenosis.  5. Carotid Doppler shows right 40-60% ICA stenosis.  No left ICA      stenosis.  6. 2-D echocardiogram showed EF of 55-60% with no sign of embolus.  7. TCD was completed, results pending.  8. EKG:  Normal sinus rhythm, left atrial enlargement, cannot rule out      anterior infarct.   LABORATORY STUDIES:  CBC normal.  Chemistry with glucose 122, otherwise  normal.  Coagulation studies normal.  Liver function tests normal.  Cardiac enzymes negative.  Cholesterol 167, triglycerides 68, HDL 74 and  LDL 179.  Urinalysis showed 21-50 white blood cells, 11-20 red blood  cells, large leukocyte esterase, and many bacteria.  Urine culture is  pending at time of discharge.  Homocystine is 11.5.  Alcohol level less  than 5.  Hemoglobin A1c 5.7.  Urine drug screen positive for  benzodiazepines.   HISTORY OF PRESENT ILLNESS:  [name] is a 65 year old right-  handed Caucasian female with multiple medical problems  including  coronary artery disease and anxiety, who presents after a 3-hour episode  the day prior to admission of difficulty expressing words and slurred  speech.  The patient states that her symptoms occurred acutely and  resolved within 3 hours.  She has had no recurrence of symptoms.  She  also had a slight bitemporal headache at time.  She states that she  occasionally gets lightheaded when she stands up, especially after her  recent heart surgery.  She was admitted to the hospital February to  March 2008 with a heart attack and CABG x4 with postop complications of  multi-organ failure.  [name] currently receiving cardiac rehab.  She denies any other focal neurologic abnormalities.  She was admitted  for further stroke workup.   MRI was negative for acute stroke.  It was felt the patient had left TIA  secondary to terminal left ICA stenosis.  This stenosis was confirmed by  MRA and CTA.  [name] interested in having a stent to open the  stenosis.  Dr. Kerby Nora was consulted and arrangements have been  made for that to occur on Tuesday, May 20.  Until that point, the  patient will be discharged home.  She is neurologically back to  baseline.  She was placed on Xanax by Dr. Corliss Skains for her anxiety.  She will take aspirin and Plavix for stroke prevention.  Interventional  radiology also put her on a prednisone dose prior to angiogram secondary  to possible IV DYE allergy.   CONDITION ON DISCHARGE:  The patient alert and oriented x3.  Speech  clear.  No focal neurologic deficit.   DISCHARGE PLAN:  1. Discharge home with family.  2. Aspirin and Plavix for stroke prevention.  3. Prednisone dosing for possible IV Dye allergy,  4. Cerebral angiogram and cerebral stent for the left ICA stenosis is      scheduled for Tuesday, May 20,  5. Follow up with primary care physician within 1 month.  6. Follow up with Dr. Pearlean Brownie in 3 months.  7. Hold on cardiac rehab until  after stent procedure and then      reevaluate.      Annie Main, N.P.    ______________________________  Sunny Schlein. Pearlean Brownie, MD    SB/MEDQ  D:  02/10/2007  T:  02/10/2007  Job:  [id number]   cc:   Sanjeev K. Corliss Skains, M.D.  Dr. Corinda Gubler  Dr. Hartley Barefoot

## 2011-02-09 NOTE — Assessment & Plan Note (Signed)
[name]                            CARDIOLOGY OFFICE NOTE   NAME:[name], [name] COGLE                     MRN:          [id number]  DATE:03/16/2007                            DOB:          [birth year]    HISTORY OF PRESENT ILLNESS:  [name] is a 65 year old lady who  underwent stenting of the proximal LAD in late 2007.  She then re-  presented with progression of the disease more proximal to that site in  February.  We treated this with a 4-vessel CABG on February 25.  Her  postoperative course was complicated by a transfusion reaction, which  resulted in acute liver dysfunction and renal dysfunction.  After a  lengthy hospital course, everything recovered nicely.   In late February, she suffered a TIA, marked by dysphasia.  She  underwent stenting of the intracranial left internal carotid artery by  Dr. Corliss Skains.  She has not had any recurrent symptoms of TIA or stroke  since.  Over the past month, [name] has been troubled by a very  sharp left-sided chest pain.  When I saw her a few weeks ago, she said  it generally lasted just a few seconds and then went away.  She felt  that it might be worse with taking a deep breath and was not positional.  This has persisted.  She did have 1 episode where the pain lasted  approximately 15 minutes at [employer].  The pain is isolated to a band  across her chest and wrapping around to her back.  Unlike her prior  angina, it does not radiate to her jaw.  She feels it is quite distinct  from her prior angina.  She saw Dr. Erby Pian regarding this yesterday.  She said that he gave her an injection of an antiinflammatory.   CURRENT MEDICATIONS:  1. Lipitor 20 mg nightly  2. Metoprolol 25 mg twice daily.  3. Prilosec 20 mg daily.  4. Aspirin 81 mg daily.  5. Sertraline 200 mg daily.  6. Plavix 75 mg daily.  7. Mirapex.  8. Xanax 0.25 mg nightly.   PHYSICAL EXAM:  She is generally well-appearing in no  distress.  Heart rate is 68, blood pressure 90/52, and weight of 157 pounds.  She has no jugular venous distension, thyromegaly, or lymphadenopathy.  Respiratory effort is normal.  Lungs are clear to auscultation.  She has a nondisplaced point of maximal impulse.  There is a regular  rate and rhythm without murmur, rub, or gallop.  ABDOMEN:  Soft, nondistended, nontender.  There is no  hepatosplenomegaly.  Bowel sounds are normal.  EXTREMITIES:  Warm without clubbing, cyanosis, edema, or ulceration.   IMPRESSION/RECOMMENDATIONS:  Chest pain:  Maybe some costochondritis,  which is what it sounds like Dr. Erby Pian is treating her for.  It  sounds very distinct from her prior myocardial ischemic pain.  Will  therefore not evaluate further at this time.  Leave management to Dr.  Erby Pian.  I will have her follow up in our office in 6 to 8 weeks.  If  not resolved, would  consider  ETT Cardiolite.  I am fairly confident, however, that this is  not cardiac due to the normal troponin after multiple episodes of pain  when I saw her just 3 weeks ago.     Salvadore Farber, MD  Electronically Signed    WED/MedQ  DD: 03/16/2007  DT: 03/16/2007  Job #: [id number]   cc:   Franchot Heidelberg, M.D.

## 2011-02-09 NOTE — H&P (Signed)
[name], [name]              ACCOUNT NO.:  [id number]   MEDICAL RECORD NO.:  [id number]          PATIENT TYPE:  OUT   LOCATION:  XRAY                         FACILITY:  [facility]   PHYSICIAN:  Sanjeev K. Deveshwar, M.D.DATE OF BIRTH:  26-Feb-1946   DATE OF ADMISSION:  02/14/2007  DATE OF DISCHARGE:                              HISTORY & PHYSICAL   CHIEF COMPLAINT:  Symptomatic left internal carotid artery stenosis.   HISTORY OF PRESENT ILLNESS:  This is a very pleasant 65 year old female  who was admitted to [facility] from Feb 08, 2007 to Feb 10, 2007 after she developed aphasia and dysarthria.  A CVA was ruled out.  The patient had a CT angiogram performed on Feb 10, 2007 that showed a  severe stenosis of the internal carotid artery.  She was seen in  consultation by Dr. Corliss Skains and arrangements were made to have the  patient return to [facility] today to undergo a cerebral  angiogram with possible PTA stenting of the left internal carotid  artery.  The patient has been on aspirin.  She was placed on Plavix  during her recent admission.  She was also given Xanax for severe  anxiety and she was placed on a 13-hour prednisone protocol due to a  history of contrast dye allergy.   PAST MEDICAL HISTORY:  The patient has a history of coronary artery  disease.  She has had stents placed in her LAD in December 2007.  She  had an MI I believe in early 2008.  She had coronary artery bypass graft  surgery in February 2008.  She had a 2-D echo during this most recent  admission revealing an ejection fraction of 55% to 60%.  She has a  history of hyperlipidemia, hypertension, gastroesophageal reflux  disease, anxiety as well as panic attacks.  She had palpitations and a  history of SVT following her coronary artery bypass graft surgery.  She  has arthritis, macular degeneration and following her bypass surgery,  she had an acute hemolytic transfusion reaction and  developed a  coagulopathy with liver dysfunction and acute renal failure, all of  which have since resolved.  She has a history of cervical disk disease.   SOCIAL HISTORY:  1. [name] status post cholecystectomy.  2. She had coronary artery bypass graft surgery in February 2008.  3. She had previous knee surgery as well as a cervical diskectomy.   ALLERGIES:  [name] allergic to CONTRAST DYE.  She did receive a  13-hour prednisone protocol.  She will also receive Benadryl and Pepcid  intravenously prior to the intervention.  She is also reportedly  allergic to MORPHINE, VICODIN and CODEINE.   CURRENT MEDICATIONS:  Aspirin, Plavix, Lipitor, Zoloft, Lopressor,  Mirapex and multivitamins.   SOCIAL HISTORY:  [name] married.  She has 2 sons.  She lives in  [place] with her husband.  She quit smoking in November 2007; she did  smoke 1 pack of cigarettes per day for 35 years.  She uses alcohol  rarely.  She is retired.  FAMILY HISTORY:  Her mother died at age 40 from gastric cancer; she had  diabetes.  Her father died at age 46 from lung cancer.   LABORATORY DATA:  An INR is 1.0; a PTT was 32.  A CBC revealed  hemoglobin of 13.8, hematocrit 41.2, WBC 6800, platelets 303,000.  BUN  14, creatinine 0.79, potassium 4.7, GFR of greater than 60.  Glucose was  150.   REVIEW OF SYSTEMS:  Completely negative, except for the following:  She  has had a nonproductive cough.  She has had occasional headaches.  She  has a headache this morning.  She has had recent diarrhea which she  attributes to anxiety.  She does have arthritis.  She reports bruising  easily.  On Saturday and Sunday following her hospital discharge, she  had multiple episodes of dysarthria and aphasia.   PHYSICAL EXAMINATION:  VITAL SIGNS:  Blood pressure 141/83, pulse 69,  respirations 20, temperature 97, oxygen saturation 97% on room air.  GENERAL:  This is a 65 year old white female who is very  anxious, but in  no acute distress.  HEENT:  Unremarkable.  NECK:  Reveals no bruits, no jugulovenous distention.  There is an old  surgical scar at the base of the left side of her neck.  HEART:  Reveals regular rate and rhythm with somewhat distant heart  sounds.  No murmur was appreciated.  LUNGS:  Clear.  ABDOMEN:  Soft and nontender.  EXTREMITIES:  Reveal pulses to be intact without edema.  NEUROLOGIC:  Mental status:  [name] alert and oriented and  follows commands.  Cranial nerves II-XII are grossly intact.  Sensation  is intact to light touch.  Motor strength is 5/5, except for the left  upper extremity, which was 4/5; the patient attributes this to her  previous cervical disk surgery.  Cerebellar testing was intact  bilaterally.   ACCESSORY CLINICAL DATA:  Her airway was rated at a 2.  Her ASA scale  was rated at a 4.   IMPRESSION:  1. Symptomatic left internal carotid artery stenosis with ongoing      symptoms despite aspirin and Plavix therapy.  2. History of right carotid stenosis.  3. History of coronary artery disease with coronary artery bypass      graft surgery in February 2008.  4. Previous myocardial infarction.  5. Previous stents to the left anterior descending in December 2007.  6. Ejection fraction of 55% to 60% by recent echocardiogram.  7. History of supraventricular tachycardia and palpitations following      her bypass surgery.  8. History of anxiety and panic attacks.  9. Arthritis.  10.Macular degeneration.  11.Gastroesophageal reflux disease.  12.Hypertension.  13.Hyperlipidemia.  14.History of an acute hemolytic transfusion reaction with subsequent      coagulopathy, liver dysfunction and acute renal failure following      her coronary artery bypass graft surgery.  15.History of cervical diskectomy.  16.History of knee surgery.  17.History of contrast dye allergy. 18.History of allergies to morphine, Vicodin and codeine.  19.Remote  tobacco history.   PLAN:  As noted, the patient has been maintained on aspirin and Plavix  therapy.  She has received a 13-hour prednisone protocol.  She will also  receive IV Benadryl and Pepcid prior to the intervention due to a  contrast dye  allergy.  The patient has continued to have symptoms despite maximum  medical therapy.  She will undergo a cerebral angiogram to be performed  by Dr. Corliss Skains under conscious sedation for further evaluation of her  vascular disease with possible PTA stenting if felt to be safe and  indicated.      Delton See, P.A.    ______________________________  Grandville Silos. Corliss Skains, M.D.    DR/MEDQ  D:  02/14/2007  T:  02/14/2007  Job:  [id number]   cc:   Pramod P. Pearlean Brownie, MD  Salvadore Farber, MD  Franchot Heidelberg, M.D.

## 2011-02-09 NOTE — H&P (Signed)
[name], [name]              ACCOUNT NO.:  [id number]   MEDICAL RECORD NO.:  [id number]          PATIENT TYPE:  INP   LOCATION:  6742                         FACILITY:  [facility]   PHYSICIAN:  Dr. Corinda Gubler            DATE OF BIRTH:  [birth year]   DATE OF ADMISSION:  02/08/2007  DATE OF DISCHARGE:                              HISTORY & PHYSICAL   REASON FOR ADMISSION:  TIA.   HISTORY OF PRESENT ILLNESS:  [name] is a 65 year old Caucasian  female with multiple medical problems who presents after a 3-year  episode yesterday of difficulty expressing words and slurred speech.  The patient states that her symptoms occurred acutely and resolved  within three hours.  She has had no recurrence of symptoms.  She also  had a slight bitemporal headache at times at times.  And she states that  she occasionally gets lightheaded when she stands up, especially after a  recent heart surgery.  She was admitted recently from February to March  with MI, status post CABG x4 and had complications of multiorgan  failure.  [name] not currently getting cardiac rehab.  She  denies any other symptoms of focal weakness, numbness, vision changes,  swallowing problems chewing problems, vertigo, falls or loss of  consciousness.   PAST MEDICAL HISTORY:  Positive for left macular degeneration, hear  disease status post MI, CABG and stent placement, cervical disk disease  and right carotid disease 60-80%.   CURRENT MEDICATIONS:  1. Lipitor.  2. Prilosec.  3. Zoloft.  4. Lopressor.  5. Multivitamin.  6. Aspirin.  7. Mirapex.   ALLERGIES:  CODEINE.   FAMILY HISTORY:  Positive for diabetes.   SOCIAL HISTORY:  The patient lives with husband.  Quit smoking  November2007.  Used to smoke less than one pack per day.  Denies any  alcohol, drug use.   REVIEW OF SYSTEMS:  Positive as per HPI, also for fatigue and occasional  shortness of breath.  Review of systems negative as per history of  present  illness and greater than seven other organ systems.   PHYSICAL EXAMINATION:  VITALS:  Temperature is 98.1, pulse 75,  respirations 16, blood pressure 141/73, O2 sat is 96%.  HEENT:  Normocephalic, atraumatic.  Extraocular muscles intact.  Pupils  equal and reactive to light.  NECK:  Supple.  No carotid bruits.  HEART:  Regular.  LUNGS:  Clear.  ABDOMEN:  Soft, nontender.  EXTREMITIES:  Show no edema.  Good pulses.  NEUROLOGICAL:  [name] awake, alert and oriented.  Language is  fluent.  Fund of knowledge within normal limits.  Cranial nerves II-XII  grossly intact, except for the patient has decreased sensation to light  touch on the left side of her face.  However, there is discrepancy with  positive tuning fork on the frontal place.  Motor examination:  The  patient has 4+-5/5 strength and no drift noted.  Normal tone in all four  extremities.  Sensation:  The patient has decreased sensation to light  touch on the left when  compared to the right.  Reflexes are 1-2+  throughout.  Toes are neutral bilaterally.  Cerebellar function is  within normal limits finger-to-nose, heel-to-shin.  Gait is within  normal limits.  NIH stroke scale is 1 for sensory discrepancy.   LABORATORY DATA:  Labs and imaging are currently pending.   IMPRESSION:  This is a 65 year old Caucasian female with transient  speech difficulty and left-sided numbness.  Will admit to the stroke MD  service to evaluate for TIA versus stroke.  Will get MRI/MRA of the  brain, 2-D echocardiogram, carotid Dopplers, lipids and homocystine  level.  Will continue home medications and will add Plavix to her  treatment regimen.  Will place her on DVT prophylaxis.  We will follow  the patient while she is hospital.      [name]. [name], M.D.  Electronically Signed     ______________________________  Dr. Corinda Gubler    DRC/MEDQ  D:  02/08/2007  T:  02/09/2007  Job:  [id number]

## 2011-02-09 NOTE — Assessment & Plan Note (Signed)
[facility]                            CARDIOLOGY OFFICE NOTE   NAME:[name], [name]                     MRN:          [id number]  DATE:02/08/2007                            DOB:          [birth year]    PHYSICIANS:  Salvadore Farber, MD   HISTORY OF PRESENT ILLNESS:  This is a very pleasant 64 year old white  female patient of Dr. Franchot Heidelberg with history of coronary artery  disease status post non-ST elevation MI in December 2007, treated with  two drug eluting stents to the proximal and mid LAD.  She then returned  with recurrent chest pain in February and had new stenosis in the  proximal LAD and progression in the circumflex and CABG was recommended.  She underwent CABG x4 on November 21, 2006, with a LIMA to the LAD,  reverse SVG to the OM and distal circumflex and reverse SVG to the PLA  of the RCA.  She, unfortunately, bled postop requiring a transfusion  then developed an acute hemolytic transfusion reaction with coagulopathy  and liver dysfunction as well as acute renal failure secondary to acute  tubular necrosis which all resolved.  She was doing well until a few  weeks ago where she describes a pounding of her heartbeat in her ear.  She says she can hear it and does not necessarily complain of  palpitations.  She says it is a pounding that is actually faster than  her baseline heartbeat.  She does not think it skips or races, but it is  a bit faster.  She said it started out slowly happening once or twice a  day, began happening as many as 20-30 times a day and is now tapering  off and not happening quite as frequently.  She mentioned it to cardiac  rehab nurses who suggested she come here for further evaluation.  She  drinks two cups of coffee in the morning.  Otherwise, no caffeine  intake, and she has quit smoking.  She says this occurs while watching  TV or at any time during the day.  She is not awakened from sleep with  this and generally does not have any dizziness or presyncope or chest  pain or shortness of breath with it.  She can bring it on if she bends  over to pick something up, and she can hear her heartbeat and then she  becomes dizzy with it, only when she bends over.  She recently had blood  work at  [facility] on May 2 in which her CBC, C-met, lipid  panel and TSH were all completely normal.   She also had an episode yesterday where she felt like her tongue was  enlarged and she had slurred speech for a few seconds.  She does have a  known 60-80% right internal carotid artery stenosis by OP note, but I do  not have the actual Dopplers here in front of me.  Her sister had a  stroke back in November, so she is worried about this as well.  The  symptoms lasted only a few seconds before  they resolved.   [name] actually exercising regularly, walking two miles a day,  and seems to be doing well overall.   CURRENT MEDICATIONS:  1. Lipitor 20 mg q.h.s.  2. Metoprolol 25 mg b.i.d.  3. Prilosec 20 mg daily.  4. Aspirin 81 mg daily.  5. Glucosamine 500 mg daily.  6. Sertraline 100 mg two daily.   PHYSICAL EXAMINATION:  GENERAL:  A very pleasant, 65 year old white  female in no acute distress.  VITAL SIGNS:  Blood pressure 123/70, pulse 63, weight 159.  NECK:  Without JVD, HJR.  I actually hear a left carotid bruit.  No  bruit on the right, although, OP report states that the prior blockage  is on the right .  LUNGS:  Decreased breath sounds but clear anterior, posterior and  lateral.  HEART:  Regular rate and rhythm at 60 beats per minute.  Normal S1, S2.  Positive S4.  No significant murmur, rub, bruit, thrill or heave noted.  ABDOMEN:  Soft without organomegaly, masses, lesions or abnormal  tenderness.  EXTREMITIES:  Without cyanosis, clubbing or edema.  She has good distal  pulses.   STUDIES:  EKG:  Normal sinus rhythm.  No acute change.  No ectopy.   IMPRESSION:  1.  Awareness of her heartbeat, question palpitations or arrhythmia.      Did have SVT in the hospital postop.  2. Coronary artery disease status post CABG x4 on November 21, 2006      with a LIMA to the LAD, SVG to the OM and distal circumflex, SVG to      the PLA of the RCA.  3. Prior non-ST elevation MI treated with two drug eluting stents to      the LAD in December 2007.  4. Acute hemolytic transfusion reaction with coagulopathy and liver      dysfunction as well as acute renal failure and secondary to acute      tubular necrosis postop, all resolved.  5. Postop respiratory insufficiency, resolved.  6. Hypertension.  7. Hyperlipidemia.  8. Arthritis.  9. GERD.  10.Macular degeneration.  11.A 60-80% right internal artery stenosis with recent symptoms of      brief slurred speech and feeling of enlarged tongue.   PLAN:  At this time, we have asked the patient to be seen by Neurology  as soon as possible.  I will try to obtain her carotid Doppler records.  We have arranged for her to have an event recorded to see if she is  having any arrhythmias or SVT to cause her symptoms.  I will not do  blood work given she just had normal blood work done in [place] [facility].  She  already has an  appointment to see Dr. Samule Ohm back in June, and she is to call for any  further problems in the interim.      [name] Reedy, PA-C       Cecil Cranker, MD, [facility]    ML/MedQ  DD: 02/08/2007  DT: 02/08/2007  Job #: [id number]   cc:   Franchot Heidelberg, M.D.

## 2011-02-09 NOTE — Discharge Summary (Signed)
[name], [name]              ACCOUNT NO.:  [id number]   MEDICAL RECORD NO.:  [id number]          PATIENT TYPE:  OIB   LOCATION:  3105                         FACILITY:  [facility]   PHYSICIAN:  Sanjeev K. Deveshwar, M.D.DATE OF BIRTH:  December 28, 1945   DATE OF ADMISSION:  02/14/2007  DATE OF DISCHARGE:  02/15/2007                               DISCHARGE SUMMARY   CHIEF COMPLAINT:  Status post left internal carotid artery PTA stent.   HISTORY OF PRESENT ILLNESS:  This is a very pleasant 65 year old female  who was admitted to [facility] Feb 08, 2007 to Feb 10, 2007  after she developed aphasia and dysarthria.  A CVA was ruled out.  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  The patient was seen in consultation  by Dr. Corliss Skains at the request of Dr. Pearlean Brownie and arrangements were made  to have the patient return to [facility] on Feb 14, 2007 for a  cerebral angiogram and possible PTA stenting of the left internal  carotid artery.  The patient had been on aspirin and Plavix since  discharge from the hospital.  She reported having multiple episodes on  Saturday and Sunday of aphasia as well as dysarthria prior to her Monday  morning admission.  The patient was also treated with Xanax for severe  anxiety.  She had received a 13-hour prednisone protocol prior to  admission due to a contrast dye allergy.   PAST MEDICAL HISTORY:  Significant for coronary artery disease.  She had  LAD stents placed in December 2007.  She had an MI I believe in early  2008.  She had coronary artery bypass graft surgery in February 2008.  She had a 2-D echo during her most recent admission.  This showed an  ejection fraction of 55-60%.  She has a history of hyperlipidemia,  hypertension, gastroesophageal reflux disease, anxiety and panic  attacks, palpitations and history of SVT following her coronary artery  bypass graft  surgery.  She has arthritis, macular degeneration and  history of cervical disk disease.  Following her coronary artery bypass  graft surgery, she had an acute hemolytic transfusion reaction and  developed a coagulopathy and liver dysfunction as well as acute renal  failure all of which have resolved.  She has a history of tobacco use.   SURGICAL HISTORY:  [name] status post cholecystectomy, status  post coronary artery bypass graft surgery February 2008.  She has had  previous knee surgery and previous cervical diskectomy.   ALLERGIES:  [name] allergic to CONTRAST DYE.  She received a 13-  hour prednisone protocol prior to admission as well as Benadryl and  Pepcid prior to her intervention.  She also was reportedly allergic to  MORPHINE, VICODIN AND CODEINE.   MEDICATIONS ON ADMISSION:  Aspirin, Plavix, Lipitor, Zoloft, Lopressor,  Mirapex and multivitamins.   SOCIAL HISTORY:  [name] married.  She has two sons.  She lives  in [place] with her husband.  She quit smoking in  November 2007.  She  did smoke one pack of cigarettes per day for 35 years.  She uses alcohol  rarely.  She is retired.   FAMILY HISTORY:  Her mother died at age 2 from gastric cancer.  She  also had diabetes.  Her father died at age 65 from lung CA.   HOSPITAL COURSE:  As noted, this patient was admitted to [facility] on Feb 14, 2007 to undergo a cerebral angiogram with possible  PTA stenting of the left internal carotid artery for ongoing TIAs  despite aspirin and Plavix therapy.  The patient had been seen in  consultation by Dr. Corliss Skains during her recent hospital stay Feb 08, 2007 through Feb 10, 2007.  At that time Dr. Corliss Skains met with the  patient and her husband to discuss the cerebral angiogram and possible  PTA stenting along with potential risks and benefits as well as other  treatment options such as continued conservative therapy with aspirin  and Plavix.  The  patient and her husband agreed to proceed.   On the day of admission, the patient had a cerebral angiogram performed  by Dr. Corliss Skains under conscious sedation.  She was found to have severe  left internal carotid artery stenosis.  She underwent PTA stenting  without immediate or known complications.  Please see his dictated  report for full details.   The patient was admitted to the neuro intensive care unit where she was  monitored closely overnight.  She remained stable from a neurological  standpoint.  The plan at this time is to remove her right femoral groin  sheath.  Afterwards she will remain on bedrest for at least 6 hours.  The patient will then be ambulated with assistance.  If she remains  stable, the plan will be for discharge early this evening.   LABORATORY DATA:  On the day of discharge a CBC revealed hemoglobin  11.1, hematocrit 33.3, WBCs were 14.2, platelets were 255,000.  It was  felt that her white blood cell count was elevated due to a recent  prednisone protocol.  Basic metabolic panel revealed a BUN of 14,  creatinine 0.79, GFR was greater than 60, potassium was 4.7, glucose was  150 again felt elevated secondary to prednisone.  On admission her  hemoglobin had been 13.8, hematocrit was 41.2.  A chest x-ray on Feb 14, 2007 showed a previous median sternotomy.  There was interval clearing  of the bilateral pleural effusions and bibasilar atelectasis.  There was  no evidence for active chest disease.   DISCHARGE MEDICATIONS:  The patient was told to continue on the  medication she was on prior to admission.  She would also continue  aspirin and Plavix until seen in follow-up by Dr. Corliss Skains.   The patient was given instructions regarding wound care.  She was told  to stay on a low salt, low cholesterol diet.  The patient was not to  drive or do anything strenuous for at least 2 weeks.  The patient was given a follow-up appointment with Dr. Corliss Skains in  approximately 2  weeks.   PROBLEM LIST AT TIME OF DISCHARGE:  1. Status post percutaneous transluminal angioplasty stent of the left      internal carotid artery performed Feb 14, 2007 for symptomatic      stenosis despite aspirin and Plavix therapy.  2. History of transient ischemic attacks.  3. Coronary artery disease with previous myocardial infarction, left  anterior descending coronary artery stents and coronary artery      bypass graft surgery.  4. Ejection fraction 55-60% by recent echocardiogram.  5. History of supraventricular tachycardia and palpitations following      coronary artery bypass graft surgery.  6. History of anxiety and panic attacks.  7. Arthritis.  8. Macular degeneration.  9. Gastroesophageal reflux disease.  10.Hypertension.  11.Hyperlipidemia.  12.History of an acute hemolytic transfusion reaction with subsequent      coagulopathy, liver dysfunction and acute renal failure following      her coronary artery bypass graft surgery.  13.History of cervical diskectomy.  14.History of knee surgery.  15.History of contrast dye allergy.  16.Possible allergies to morphine, Vicodin and codeine.  17.Remote tobacco history.      Delton See, P.A.    ______________________________  Grandville Silos. Corliss Skains, M.D.    DR/MEDQ  D:  02/15/2007  T:  02/15/2007  Job:  [id number]   cc:   Salvadore Farber, MD  Franchot Heidelberg, M.D.  Pramod P. Pearlean Brownie, MD

## 2011-02-09 NOTE — Op Note (Signed)
NAMEEDRIS, SCHNECK              ACCOUNT NO.:  [id number]   MEDICAL RECORD NO.:  [id number]          PATIENT TYPE:  AMB   LOCATION:  DAY                           FACILITY:  [facility]   PHYSICIAN:  R. Roetta Sessions, M.D. DATE OF BIRTH:  1946/09/06   DATE OF PROCEDURE:  01/16/2009  DATE OF DISCHARGE:                               OPERATIVE REPORT   Ileal colonoscopy with biopsy followed by esophagogastroduodenoscopy  with biopsy.   INDICATIONS FOR PROCEDURE:  A 65 year old lady with multiple medical  problems including iron-deficiency anemia.  She has intermittent  nonbloody diarrhea and has intermittent episodes of painless  hematochezia.  She has longstanding gastroesophageal reflux disease  symptoms well-controlled on acid suppression therapy.   She is now undergoing her first ever colonoscopy with possible EGD to  follow depending on findings of colonoscopy.  The risks, benefits,  alternatives and limitations have been reviewed previously and again at  the bedside.  Please see documentation in the medical record.   PROCEDURE NOTE:  O2 saturation, blood pressure, pulse and respirations  monitored throughout the entirety of both procedures.  Conscious  sedation:  Versed 6 mg IV, Demerol 100 mg IV in divided doses.  Instrument:  Pentax video chip system.  Cetacaine spray for topical  pharyngeal anesthesia.   COLONOSCOPY FINDINGS:  Digital rectal exam revealed no abnormalities.  The prep was adequate.  Colon:  Colonic mucosa was surveyed from the  rectosigmoid junction through the left transverse, right colon to the  area of the appendiceal orifice, ileocecal valve and cecum.  These  structures were well seen and photographed for the record.  Terminal  ileum was intubated to 10 cm.  From this level the scope was slowly and  cautiously withdrawn.  All previously mentioned mucosal surfaces were  again seen.  The patient was noted to have left-sided diverticula and a  single diminutive  polyp at the splenic flexure which was cold  biopsied/removed.  The remainder of the colonic mucosa appeared entirely  normal as did terminal ileal mucosa.  The scope was pulled out of the  rectum where thorough exam of the rectal mucosa including en face view  of the anal canal demonstrated only anal canal hemorrhoids.  The patient  tolerated the procedure well and was prepared for EGD.   EGD FINDINGS:  Examination of the tubular esophagus revealed normal-  appearing esophageal mucosa.  The EG junction was easily traversed.  Stomach:  Gastric cavity was emptied insufflated well with air.  Thorough examination of the gastric mucosa including retroflexion of the  proximal stomach, esophagogastric junction demonstrated a small hiatal  hernia and multiple 1-3 mm benign-appearing polyps in the fundus and  body.  Please see photos.  There were also superficial linear erosions  in the antrum.  There was no infiltrating process or ulcer.  Pylorus was  patent and easily traversed.  Examination of the bulb, second and third  portion revealed normal appearing mucosa.  Therapeutic/diagnostic  maneuvers were performed.  One of the gastric polyps was biopsied for  histologic study.  Subsequent biopsies of the antrum were taken  for  histologic study.  There was minimal bleeding with all biopsies today.  The patient tolerated the procedures well and was reactive after  endoscopy.  Cecal withdrawal time 8 minutes.   COLONOSCOPY IMPRESSION:  1. Anal canal hemorrhoids, otherwise normal rectum.  2. Left-sided diverticula.  3. Diminutive polyp at the splenic flexure status post cold biopsy      removal.  4. Remainder of the colonic mucosa and terminal ileum mucosa appeared      normal.   EGD FINDINGS:  1. Normal esophagus.  2. Hiatal hernia.  3. Multiple small gastric polyp status post biopsy.  4. Linear antral erosions status post biopsy.  5. Otherwise unremarkable stomach, patent pylorus, normal  duodenal 1-      3.   RECOMMENDATIONS:  1. I suspect the patient has bled from hemorrhoids and lower GI tract      symptoms, otherwise most consistent with irritable bowel syndrome.  2. Hemorrhoid and diverticulosis literature provided for Ms. [name].      Daily fiber supplement such as Benefiber or Citrucel.  3. A 10-day course of Anusol HC suppositories one per rectum at      bedtime.  4. Follow-up on path.  5. We will go ahead and draw a celiac antibody panel and serum IgA      level even though her duodenal mucosa appeared entirely normal      today.  6. Further recommendations to follow.      Jonathon Bellows, M.D.  Electronically Signed     RMR/MEDQ  D:  01/16/2009  T:  01/16/2009  Job:  [id number]   cc:   Franchot Heidelberg, M.D.

## 2011-02-09 NOTE — Procedures (Signed)
NAMEETRULIA, ZARR              ACCOUNT NO.:  [id number]   MEDICAL RECORD NO.:  [id number]          PATIENT TYPE:  OUT   LOCATION:  RESP                          FACILITY:  [facility]   PHYSICIAN:  Edward L. Juanetta Gosling, M.D.DATE OF BIRTH:  [birth year]   DATE OF PROCEDURE:  DATE OF DISCHARGE:                            PULMONARY FUNCTION TEST   1. Spirometry shows a mild ventilatory defect with evidence of airflow      obstruction.  2. Lung volumes are normal.  3. DLCO is mildly reduced.  4. Arterial blood gases are normal.  5. There is no significant bronchodilator improvement.  6. This is consistent with COPD.      Edward L. Juanetta Gosling, M.D.  Electronically Signed     ELH/MEDQ  D:  01/10/2009  T:  01/10/2009  Job:  [id number]   cc:   Franchot Heidelberg, M.D.

## 2011-02-09 NOTE — Consult Note (Signed)
[name], [name]              ACCOUNT NO.:  [id number]   MEDICAL RECORD NO.:  [id number]          PATIENT TYPE:  OUT   LOCATION:  XRAY                         FACILITY:  [facility]   PHYSICIAN:  Sanjeev K. Deveshwar, M.D.DATE OF BIRTH:  Nov 27, 1945   DATE OF CONSULTATION:  02/28/2007  DATE OF DISCHARGE:                                 CONSULTATION   DATE OF CONSULT:  February 28, 2007.   CHIEF COMPLAINT:  Status post PTA stent of the left internal carotid  artery performed Feb 14, 2007.   HISTORY OF PRESENT ILLNESS:  This is a very pleasant 65 year old female  who was admitted to [facility] Feb 08, 2007, to Feb 10, 2007,  with TIA-like symptoms.  She was admitted by the stroke team. She was  having symptoms of aphasia and dysarthria. A CVA was ruled out, although  a CT angiogram performed Feb 10, 2007, showed a severe stenosis of the  left internal carotid artery.  Dr. Corliss Skains was consulted at the  request of Dr. Pearlean Brownie, and arrangements were made to have the patient  return to [facility] on Feb 22, 2007, for cerebral angiogram  and possible PTA stenting of the left internal carotid artery.  The  patient left the hospital on aspirin and Plavix following her TIAs.  She  continued to have TIAs over the next several days until she returned to  [facility] for her PTA stenting.  The patient was given Xanax  at time of discharge for anxiety.  She was also given a 13-hour  prednisone protocol for CONTRAST DYE allergy.  She did undergo the left  internal carotid artery PTA stent on Feb 14, 2007, performed by Dr.  Corliss Skains under general anesthesia.  The result was excellent.  There  were no immediate or known complications.  She did have some bleeding  from her groin several hours after her right femoral artery sheath was  removed.  However, hemostasis was obtained, and she was discharged the  evening following the intervention.  She returns today accompanied by  her  husband to be seen in followup.   PAST MEDICAL HISTORY:  Significant for:  1. Coronary artery disease.  She had stents placed in her LAD in      December 2007. She had an MI I believe in early 2008.  She had      coronary artery bypass graft surgery in February 2008.  She had a 2-      D echocardiogram during her admission for TIAs that showed an      ejection fraction of 55-60%. There was no source of emboli noted.  2. History of hyperlipidemia.  3. Hypertension.  4. Gastroesophageal Reflux disease.  5. Anxiety, panic attacks.  6. Palpitations.  7. History of supraventricular tachycardia following her coronary      artery bypass graft surgery.  8 . arthritis.  1. Macular degeneration.  2. Cervical disk disease.  3. Following her coronary artery bypass graft surgery, she had an      acute hemolytic transfusion reaction and developed a coagulopathy  as well as liver dysfunction and acute renal failure, all of which      resolved.  4. She has a remote history of tobacco use, but she quit in November      2007.   SURGICAL HISTORY:  Significant for:  1. Cholecystectomy.  2. Coronary artery bypass graft surgery February 2008.  3. Previous knee surgery.  4. Cervical diskectomy   ALLERGIES:  [name] allergic to CONTRAST DYE, MORPHINE, VICODIN  and CODEINE.   CURRENT MEDICATIONS:  Include aspirin, Plavix, Lipitor, Zoloft,  Lopressor, Mirapex, and multivitamins.   SOCIAL HISTORY:  [name] married.  She has two son.  She lives in  [place] with her husband.  She quit smoking in November 2007.  She  did smoke one pack of cigarettes per day for at least 35 years.  She  uses alcohol rarely.  She is retired.   FAMILY HISTORY:  Mother died at age 11 from gastric cancer.  She also  had diabetes.  Her father died at age 8 from lung cancer.   IMPRESSION AND PLAN:  As noted, the patient returns today to be seen in  followup after the percutaneous transluminal  angioplasty and stenting of  the left internal carotid artery performed on Feb 02, 2007, due to  ongoing transient ischemic attacks despite aspirin and Plavix therapy.  The patient reports no further transient ischemic attack symptoms,  although she does note some left arm numbness, and she has had headaches  intermittently for the past 11 days.  The patient feels that this may be  related to her cervical disk disease.  She also reports some mild chest  tightness. She was encouraged to follow up with her cardiologist, Dr.  Randa Evens, to have this further evaluated.   Dr. Corliss Skains reviewed the before and after pictures from the angiogram  and stent placement. As noted, the results were felt to be excellent. It  was also noted that her other vessels look fine.   Dr. Corliss Skains recommended that she stay on aspirin and Plavix  indefinitely.  He recommended that she avoid secondhand smoke.  She was  told she could resume her normal activities, including cardiac rehab. A  repeat cerebral angiogram was recommended in 3 months to further  evaluate the patency of the stent.  The patient was given a prescription  for the 13-hour prednisone protocol due to her CONTRAST DYE allergy.  She was also given a prescription for Vicodin to be taken 1 q.4-6 h  p.r.n. headache pain, #30, no refills   As noted, the patient was told to follow up with her cardiologist, Dr.  Randa Evens, for further evaluation of chest tightness. She was told  to follow-up with her primary care physician, Dr. Erby Pian, and Dr.  Pearlean Brownie, her  neurologist, as needed or as scheduled.  The patient was also told to  contact us immediately or present to the emergency department if she has  any further TIAs or neurological type symptoms.   Greater than 30 minutes was spent on this consult.      Delton See, P.A.    ______________________________ Grandville Silos. Corliss Skains, M.D.    DR/MEDQ  D:  02/28/2007  T:  02/28/2007   Job:  [id number]   cc:   Pramod P. Pearlean Brownie, MD  Salvadore Farber, MD  Franchot Heidelberg, M.D.

## 2011-02-12 NOTE — Consult Note (Signed)
[name], [name]              ACCOUNT NO.:  [id number]   MEDICAL RECORD NO.:  [id number]          PATIENT TYPE:  INP   LOCATION:  2807                         FACILITY:  [facility]   PHYSICIAN:  Sheliah Plane, MD    DATE OF BIRTH:  01-17-1946   DATE OF CONSULTATION:  11/18/2006  DATE OF DISCHARGE:                                 CONSULTATION   FOLLOW-UP CARDIOLOGIST:  Salvadore Farber, M.D.   PRIMARY CARE PHYSICIAN:  Dr. Erby Pian, [facility].   REASON FOR CONSULTATION:  Recurrent coronary occlusive disease.   HISTORY OF PRESENT ILLNESS:  [name] a 65 year old female who has  a long history of smoking.  Originally presented on Christmas Eve 2007  with acute onset of chest pain with myocardial infarction and underwent  angioplasty and drug-coated stents x2 in the LAD.  She initially did  well at home, but over the past three days had rapidly increasing  episodes of chest pain starting in the evening, at least two episodes of  nocturnal dyspnea, with chest pain relieved with nitroglycerin.  Because  of the recurrent symptoms, the patient was admitted yesterday and  underwent cardiac catheterization today.   PAST MEDICAL HISTORY:  1. Non-ST elevation myocardial infarction, September 19, 2006, as noted      above.  2. History of hyperlipidemia.  3. History of hypertension.  4. History of arthritis.  5. History of GERD.  6. 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   PREVIOUS SURGERY:  1. Several operations on the neck.  2. Cholecystectomy.  3. Arthroscopy  left knee.   SOCIAL HISTORY:  The patient lives with her husband in [place].  She  has been a smoker for 35 years and quit approximately 3 months ago.   FAMILY HISTORY:  Significant for mother who died at age 6 of gastric  carcinoma.  Father died at age 77 of lung cancer.  One brother died at  age 7, motor vehicle accident.  Three sisters, one at age 72 had   stroke.   REVIEW OF SYSTEMS:  Denies any constitutional symptoms such as fever or  chills.  She did have an episode of flu symptoms 2-3 weeks ago.  Has  decreased vision, as noted above, decreased hearing or loss of hearing  in the left ear.  Has some nocturia.  Postmenopausal.  Has a history of  arthralgias in hands, feet, and knees.  GI:  Has some nausea and GERD.   ALLERGIES:  The patient notes nausea with VICODIN and CODEINE.   CURRENT MEDICATIONS:  1. Plavix 75 mg a day.  2. Lipitor 80 mg a day.  3. Lopressor 50 mg b.i.d.  4. Zoloft 100 mg at bedtime.  5. Lisinopril 10 mg a day.  6. Prilosec over the counter 20 mg a day.  7. Aspirin 325 mg.  8. Nitroglycerin p.r.n.   PHYSICAL EXAMINATION:  GENERAL:  [name] resting comfortably  after cardiac catheterization with Integrilin and nitroglycerin running.  NEUROLOGIC:  [name] awake and alert, neurologically intact,  and  able to relate her history without difficulty.  VITAL SIGNS:  Blood pressure is 130/60, respiratory rate is 20, sinus  rhythm at 66, afebrile.  The patient's weight is 177 pounds.  HEENT:  Pupils equal, round, and reactive to light.  NECK:  The patient has a slight left carotid bruit.  LUNGS:  Clear bilaterally.  CARDIAC:  Regular rate and rhythm, without murmur or gallop.  ABDOMEN:  Benign, without palpable masses.   LABORATORY FINDINGS:  CK-MB 1.6, total CK 71.  Hematocrit 40, hemoglobin  13.7, platelet count 353.  Creatinine is 0.8, glucose 89, potassium is  3.9.  Total cholesterol is elevated at 222, LDL is 162.  Cardiac  catheterization films are reviewed.  The patient has some decrease in  left ventricular function, with mild anterior apical hypokinesis.  She  has a proximal 99% LAD, with diffuse disease throughout the LAD, and  narrowed lumen including the stents.  The first obtuse marginal has a  70% lesion.  The patient has diffuse disease in the proximal right, 50%,  and approximately a  60-70% lesion in the posterolateral branch of the  right coronary artery.   IMPRESSION:  [name] now 2 months status post drug-coated stents  with significant restenosis of the LAD with 99% lesion and progression  of disease in the right coronary artery and circumflex. With the early  failure of stent placement and progression of new disease, both in the  LAD and three-vessel disease, coronary artery bypass grafting is  recommended.  The patient has been on Plavix but is now on Integrilin  and Lovenox, and Plavix is being held, ideally, holding the Plavix for 7  days.  However, I think with the patient's critical anatomy that the  risk may not warrant waiting that long.  [name] aware that there  is some increased risk of bleeding if we proceed early after stopping  the Plavix.  We will plan to stop her Plavix today, continue on heparin,  Lovenox, and Integrilin, and proceed with surgery on November 21, 2006,  Monday.  With the patient's presentation, she will need to stay in the  hospital until that time.  The risks of surgery, including death,  infection, stroke, myocardial infarction, bleeding, and blood  transfusion have all been discussed with the patient in detail, and she  is willing to proceed.      Sheliah Plane, MD  Electronically Signed     EG/MEDQ  D:  11/18/2006  T:  11/18/2006  Job:  [id number]

## 2011-02-12 NOTE — Discharge Summary (Signed)
[name], [name]              ACCOUNT NO.:  [id number]   MEDICAL RECORD NO.:  [id number]          PATIENT TYPE:  INP   LOCATION:  2022                         FACILITY:  [facility]   PHYSICIAN:  Sheliah Plane, MD    DATE OF BIRTH:  1946-04-26   DATE OF ADMISSION:  11/17/2006  DATE OF DISCHARGE:  12/12/2006                               DISCHARGE SUMMARY   PRIMARY ADMITTING DIAGNOSIS:  Chest pain.   ADDITIONAL/DISCHARGE DIAGNOSES:  1. Recurrent coronary artery disease with critical LAD disease and      stenosis of a drug coated stent.  2. Hyperlipidemia.  3. Hypertension.  4. Arthritis.  5. GERD.  6. Macular degeneration.  7. Acute hemolytic transfusion reaction with coagulopathy and liver      dysfunction, resolved.  8. Acute renal failure secondary to acute tubular necrosis, resolved.  9. Right pleural effusion.  10.Postoperative respiratory insufficiency.  11.Acute blood loss anemia.  12.Deconditioning.   PROCEDURES PERFORMED:  1. Cardiac catheterization.  2. Coronary artery bypass grafting x4 (left internal mammary artery to      the LAD, sequential saphenous vein graft to the obtuse marginal and      distal circumflex, saphenous vein graft to posterolateral).  3. Endoscopic vein harvest right leg.  4. Placement of right femoral HD catheter.  5. Ultrasound-guided right thoracentesis.   HISTORY OF PRESENT ILLNESS:  [name] a 65 year old female who  originally presented in December 2007 with acute onset of chest pain for  which she was subsequently ruled in for myocardial infarction.  At that  time, she underwent an angioplasty as well as drug coated stent x2 to  the LAD.  She initially did well at home.  However, 2-3 days prior to  this admission she developed recurrent chest pain at home which was  similar to the pain she had had at the time of her MI.  She contacted  the [facility] cardiology office and was asked to return to [facility] for  admission and  further cardiac workup.   HOSPITAL COURSE:  [name] was admitted on November 17, 2006, and was  taken to the cath lab where she underwent cardiac catheterization.  She  was found to have complex greater than 95% stenosis of the proximal and  mid LAD with progression of disease in the first obtuse marginal to 70-  80%, 70% circumflex, 70% posterolateral branch of right coronary artery  and stable left ventricular function.  Because of progression of her  disease, in spite of her drug coated stent and treatment of Plavix, it  was felt she should be seen by cardiothoracic surgery for consideration  of surgical revascularization.  Dr. Tyrone Sage saw the patient and  reviewed her films and agreed that she should proceed with CABG at this  time.  He explained the risks, benefits and alternatives of procedure to  the patient, and she agreed to proceed with surgery.  Because she had  been on Plavix up to this point, it was recommended that her surgery be  delayed for 4-5 days until the Plavix had an opportunity to wash  out.  She was started on nitroglycerin, Integrilin and Lovenox and maintained  on beta blocker and aspirin while in the hospital during preoperative  workup.  She remained stable and pain free.  A preoperative workup  included carotid Doppler studies which showed a right 60-80% ICA  stenosis with no stenosis on the left and normal ABI's bilaterally.  She  was taken to the operating room on November 21, 2006, and underwent CABG  x4 as described in detail above, performed by Dr. Tyrone Sage.  She  tolerated the procedure well and was transferred to the SICU in stable  condition.  She was initially very coagulopathic and required  transfusions of packed red blood cells and platelets.  However, during  administration of the second unit of platelets, she developed what was  felt to be transfusion reaction with sudden onset of hematuria,  hypotension and chills.  A transfusion was stopped  and a transfusion  reaction protocol was initiated.  It was noted that the second unit of  platelets was type O positive, dispensed on an emergency basis.  The  patient was type A+.  She continued to have increased chest tube output,  and her chest x-ray showed a right-sided increasing pleural effusion  which was felt to be secondary to bleeding, and she required placement  of a right sided chest tube at the bedside by Dr. Tyrone Sage.  Hematology  consult was obtained and the patient was seen by Dr. Cyndie Chime.  She  was felt to have sustained an acute hemolytic transfusion reaction of  DIC and advanced acute liver dysfunction which was multifactorial.  She  was also severely coagulopathic and required aggressive replacement of  FFP platelets and packed red blood cells.  She was also treated with  volume resuscitation and diuresis.  During this time, she remained  sedated on the vent requiring low dose dopamine and Neo-Synephrine drips  as well as atrial pacing.  Her coagulopathy slowly resolved.  She was  able to be weaned from the ventilator and extubated on postop day #3.  Her LFTs also began to show improvement and her Lipitor and Tylenol are  both discontinued to allow her liver function studies to return to  baseline.  During this time, she also began to show evidence of renal  failure.  Her creatinine peaked at 5.8 and a nephrology consult was  obtained.  It was felt that her acute renal failure secondary to acute  tubular necrosis as well as her recent hemolytic transfusion reaction.  She was treated with gentle hydration and volume removal with diuresis.  Despite conservative management, her creatinine continued to trend  upward to 6.3, and it was felt that she would require hemodialysis.  A  right femoral catheter was placed at the bedside and attempt was made to  dialyze the patient which was unsuccessful.  Her catheter was thus removed.  Her renal function actually did begin to  stabilize and slowly  trend back downward.  Her drips were all discontinued.  On postop day  #8, she developed SVT with rates in the 140's to 150's.  She was started  on Cardizem, but did not convert to normal sinus rhythm.  She was rapid  atrially paced at the bedside and did convert to normal sinus rhythm.  She also developed a recurrent right pleural effusion with some  respiratory insufficiency.  A Pulmonary and Critical Care consult was  obtained, and the patient was seen by Dr. Jayme Cloud.  She recommended  continued conservative treatment with volume removal aggressive  pulmonary toilet measures and nebulizer treatments.  She ultimately  required ultrasound-guided thoracentesis on December 05, 2006, in  Interventional Radiology at which time 480 mL of serosanguineous fluid  was removed.  She overall began to show generalized improvement with  good diuresis and urine output.  Her respiratory status stabilized.  Her  heart rate remained stable and her Cardizem was discontinued.  She was  ultimately able to be transferred to the floor on December 07, 2006.  Since  that time, she has continued to make slow but steady progress.  She is  now ambulating with cardiac rehab phase I and is making good progress.  Her pulmonary status has continued to improve, and although she still  has small right pleural effusion on chest x-ray, she is off oxygen at  the present with O2 sat's of greater than 90% on room air.  She has  remained afebrile, and all her vital signs have been stable.  She is  maintaining normal sinus rhythm with no recurrence of the SVT.  Her  renal function has stabilized with her most recent BUN and creatinine at  7 and 1.08, respectively.  She is diuresing very well.  The remainder of  her labs showed hemoglobin of 8.4, hematocrit 24.3, platelets 271, white  count 7.1, sodium 139, potassium 3.9, BUN 6, creatinine 1.2.  Her  incisions are all healing well.  She does not appear  clinically volume  overloaded on physical exam.  She is ambulating the halls without  difficulty.  She is tolerating regular diet and is having normal bowel  and bladder function.  She has been seen and evaluated on December 12, 2006  by Dr. Tyrone Sage and it is felt that she is progressing well, and she may  be discharged home today.   DISCHARGE MEDICATIONS:  1. Aspirin 81 mg daily.  2. Lopressor 25 mg b.i.d.  3. Zoloft 100 mg q. h.s.  4. Prilosec OTC 20 mg daily.  5  Oxycodone 5 mg 1-2 q.4-6 h. p.r.n. pain.  1. She has been restarted on a low dose of Lipitor 420 mg q.h.s. and      her LFTs will need to be followed closely post discharge.  Also of      note, the patient has not been started back on an ACE inhibitor      secondary to her renal insufficiency postoperatively.   DISCHARGE INSTRUCTIONS:  She is asked to refrain from driving, heavy  lifting or strenuous activity.  She may continue ambulating daily and using her incentive spirometer.  She may shower daily and clean her  incisions with soap and water.  She will continue a low-fat, low-sodium  diet.   DISCHARGE FOLLOWUP:  She will need to follow up Dr. Samule Ohm in 2 weeks  and have a chest x-ray at that visit.  She will then see Dr. Tyrone Sage in  3 weeks and will be contacted by our office with this appointment.  In  the interim, if she experiences problems or has questions.  She is asked  to contact our office immediately.      Coral Ceo, P.A.      Sheliah Plane, MD  Electronically Signed    GC/MEDQ  D:  12/12/2006  T:  12/12/2006  Job:  [id number]   cc:   Genene Churn. Cyndie Chime, M.D.  Maree Krabbe, M.D.  Salvadore Farber, MD  Franchot Heidelberg, M.D.  Sheliah Plane, MD

## 2011-02-12 NOTE — Assessment & Plan Note (Signed)
[name]                            CARDIOLOGY OFFICE NOTE   NAME:[name], [name] MYERS                     MRN:          [id number]  DATE:01/12/2007                            DOB:          1945/12/05    HISTORY OF PRESENT ILLNESS:  [name] is a 65 year old lady who had a  non-ST elevation myocardial infarction in December and then represented  with unstable angina and underwent coronary artery bypass grafting  surgery in late February.  That procedure was complicated by a  transfusion reaction with resulting liver dysfunction and renal  dysfunction.  After a long hospital course she has recovered nicely.  She is participating in cardiac rehabilitation appearing well.  She does  have some persistent incisional discomfort without fever, erythema or  purulence.   Chart lists of Vicodin and Codeine.  However, the patient says that she  was recently taking Vicodin with good relief of her pain and no allergic  reaction.   MEDICATIONS:  1. Lipitor 20 mg nightly.  2. Metoprolol 25 mg twice daily.  3. Prilosec 20 mg daily.  4. Aspirin 81 mg daily.  5. Glucosamine.  6. Sertraline 200 mg daily.   PHYSICAL EXAMINATION:  She is generally well appearing in no distress.  Heart rate 80, blood pressure 118/80, weight of 161 pounds.  She has no jugular venous distension, thyromegaly or lymphadenopathy.  LUNGS:  Clear to auscultation.  She a nondisplaced point of maximal cardiac impulse.  There is a regular  rate and rhythm without murmur, rub or gallop.  Sternal incision is  healing nicely.  No surrounding erythremia, sternum is stable.  ABDOMEN:  Soft, nondistended, nontender.  There is no  hepatosplenomegaly.  Bowel sounds normal.  EXTREMITIES:  Warm without clubbing, cyanosis, edema, ulceration.  Carotid pulses 2+ bilaterally without bruit.   IMPRESSION/RECOMMENDATIONS:  1. Coronary disease, recovery nicely after myocardial infarction and  coronary artery bypass graft surgery (CABG).  Continue aspirin and      beta blocker.  2. Transient renal dysfunction, creatinine 1.2 recently.  3. Transient liver dysfunction, transaminase is essentially back to      normal recently.  Continue Lipitor.  Recheck lipids and liver      function tests in 2 months time.   I will have her follow up with me in 2 months.     Salvadore Farber, MD  Electronically Signed    WED/MedQ  DD: 01/12/2007  DT: 01/13/2007  Job #: [id number]   cc:   Franchot Heidelberg, M.D.  Sheliah Plane, MD

## 2011-02-12 NOTE — Assessment & Plan Note (Signed)
[facility]                            CARDIOLOGY OFFICE NOTE   NAME:[name], [name] GAULIN                     MRN:          [id number]  DATE:10/11/2006                            DOB:          [name]    HISTORY OF PRESENT ILLNESS:  [name] is a 65 year old woman who  presented on September 19, 2006 with non-ST elevation myocardial  infarction.  I placed drug-eluting stents in the proximal and mid LAD in  2 separate lesions.  She has done well from a cardiac view point since  then.  She has had no recurrent angina and no heart failure.  Ejection  fraction was normal at that time.   Last night she did have an episode of severe abdominal discomfort,  nausea, emesis and diarrhea.  She has had approximately 4 such episodes  over the past month.  She says she feels completely better immediately  after the episodes.  Before last night, they had all been associated  with narcotic use, which she had previously thought was the sole  trigger.  She was started on Zoloft yesterday, and wondered if this  might be related.  She says she feels completely well today.  She has  not had any fever.  She has had a prior cholecystectomy.   CURRENT MEDICATIONS:  1. Aspirin 325 mg per day.  2. Lopressor 50 mg twice per day.  3. Lipitor 80 mg per day.  4. Plavix 75 mg per day.  5. Zoloft 50 mg per day.  6. Prevacid 30 mg per day.   PHYSICAL EXAMINATION:  She is generally well appearing, in no distress.  Heart rate 64.  Blood pressure 120/80.  Weight of 179 pounds.  She has no jugular venous distention, thyromegaly, or lymphadenopathy.  LUNGS:  Clear to auscultation.  She has a nondisplaced point of maximal cardiac impulse.  There is a  regular rate and rhythm without murmur, rub, or gallop.  ABDOMEN:  Soft, non-distended, non-tender.  There is no  hepatosplenomegaly.  Bowel sounds are normal.  EXTREMITIES:  Warm without clubbing, cyanosis, edema, or ulceration.  Carotid pulses 2+ bilaterally without bruit.  Femoral pulses 2+  bilaterally.  DP pulses 2+ bilaterally.  Electrocardiogram demonstrates sinus bradycardia with T-wave inversions  in V1 and V2 alone.   IMPRESSION/RECOMMENDATION:  1. Coronary artery disease:  Status post anterior non-ST elevation      myocardial infarction 3 weeks ago.  Ejection fraction normal.      Doing well.  Continue aspirin, Plavix and beta blocker.  Aspirin      and Plavix to be continued indefinitely due to drug-eluting stents      in left anterior descending.  We will try reinitiating the      lisinopril at 10 mg per day.  Check BMET today and again in 1 week      since she had some hyperkalemia while in the hospital.  2. Abdominal discomfort last evening:  Patient feels fine today and      has a benign abdominal exam.  Since she has had several other  episodes and been well in the interim, I will not further evaluate      today.  I have asked her to see Dr. Erby Pian should it recur.  3. Hypercholesterolemia:  Continue Lipitor.  Check lipids and liver      function tests in 3 months' time.  Abdominal symptoms predate the      Lipitor initiation.  4. I will see her back in 3 months.     Salvadore Farber, MD  Electronically Signed    WED/MedQ  DD: 10/11/2006  DT: 10/12/2006  Job #: [id number]   cc:   Dr. Erby Pian

## 2011-02-12 NOTE — Op Note (Signed)
[place]  Patient:    JAMEL, DUNTON                      MRN: [id number]. Date: 04/24/01 Attending:  Loraine Leriche C. Ophelia Charter, M.D.                           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  OPERATION PERFORMED:  C4-5 anterior cervical diskectomy and fusion, right iliac crest bone graft.  SURGEON:  Mark C. Ophelia Charter, M.D.  ASSISTANT:  Humberto Leep. Wyonia Hough, M.D.  ANESTHESIA:  GOT.  ESTIMATED BLOOD LOSS:  200 ml.  DRAINS:  One Hemovac in the neck.  DESCRIPTION OF PROCEDURE:  After induction of general anesthesia with tracheal intubation, preoperative Ancef prophylaxis, sandbag behind the neck, gelbag behind the right iliac crest, arms were tucked to the sides.  DuraPrep was used.  The area was squared with towels.  Betadine  Vi-drape applied after sterile skin marker was used on the old neck incision.  Sterile Mayo stand at the head.  Thyroid sheets applied.  Incision was made using the old incision elevating the platysma and developing the plane underneath the platysma after it was divided in line with the skin incision.  Dissection continued upward taking one of the thyroid veins and artery.  Continued dissection was performed between down between the longus colli muscle.  The old plug was visualized, 4-5 needle was localized with a cross-table lateral x-ray.  The Cloward drill was used after Cloward self-retaining retractors had been placed drilling a 12 mm hole down to the posterior cortex harvesting a 14 mm plug.  The operating microscope was prepped and brought in.  The posterior longitudinal ligament had partial calcification and there was bulging out of the dura due to the stenosis the patient had which was primarily acquired stenosis.  Ligament was hypertrophic with some calcification in the midline  and the ligament was completely taken down.  The gutters were stripped, uncovertebral joint, both right and left, particularly on the left all the way out laterally taking some disk material out of the gutter which was causing compression of the nerve root.  With good decompression after irrigation, a 14 mm plug was harvested, trimmed.  Initial plug separated between the inner and outer table with the plug inside the pelvis.  The second plug was harvested which was slightly oblique but had both tables in good cancellous bone, was trimmed down to appropriate size, impacted.  The superior right portion of the plug was 1 mm proud and it was countersunk inferior and on the left side.  The neck was rotated with good stability.  A Hemovac was placed in the neck in line with the skin incision. Platysma was closed with 3-0 Vicryl, 4-0 Vicryl subcuticular closure.  Fascia was closed with 0 Vicryl in the right iliac crest, 2-0 on the subcutaneous tissues, 4-0 Vicryl, tincture of benzoin and Steri-Strips.  Postoperative dressing and soft collar.  Instrument and needle counts were correct.  The patient was transferred to the recovery room in stable condition. DD:  04/24/01 TD:  04/24/01 Job: [id number]

## 2011-02-12 NOTE — Op Note (Signed)
[name], [name]             ACCOUNT NO.:  [id number]   MEDICAL RECORD NO.:  [id number]          PATIENT TYPE:  OIB   LOCATION:  5013                         FACILITY:  [facility]   PHYSICIAN:  Mark C. Ophelia Charter, M.D.    DATE OF BIRTH:  [birth year]   DATE OF PROCEDURE:  05/10/2005  DATE OF DISCHARGE:  05/11/2005                                 OPERATIVE REPORT   PREOPERATIVE DIAGNOSES:  C6-7 spondylosis, previous C4 to C6 anterior  fusion.   POSTOPERATIVE DIAGNOSES:  C6-7 spondylosis, previous C4 to C6 anterior  fusion.   PROCEDURE:  C6 to C7 anterior cervical diskectomy and fusion, allograft  plating.   SURGEON:  Mark C. Ophelia Charter, MD.   ANESTHESIA:  GOT plus Marcaine skin local.   ASSISTANT:  Lynford Citizen, CRNA.   DRAINS:  One Hemovac, neck.   PROCEDURE:  After induction of general anesthesia and orotracheal intubation  using a horseshoe head holder, the neck was prepped and preoperative Ancef  was given. The area was squared with towels, a ___________ at the head, a  Betadine Vi-Drape and thyroid sheet applied were applied.  An incision was  made starting at the midline and extending to the left using the old scar,  opening the old scar, dissecting through the platysma, and then going  slightly distal at the level of the longus coli muscle for exposure of the  C6-7 level.  A spinal needle was placed.  A cross-table lateral C-arm had to  be obliqued in order to confirm that this was below the old fusion.  Diskectomy was performed after the microscope was brought in and a TPS 4-mm  bur was used to drill progressively back to the posterior cortex.  The  posterior longitudinal ligament was taken down with microdissection  techniques.  There were large spurs present on both the right and left with  some disk fragments on the left and primarily spurring on the right side.  After complete decompression of the dura, the uncovertebral joints were  stripped.  A 6L allograft spacer was  checked and was placed with head halter  traction applied by the CRNA.  Allograft was inserted, the 6L graft, and  then a Synthes gold plate was selected, held with a spike plate holder, and  checked AP and lateral under fluoro.  Gold 14-mm screws were used in locking  screws.  A postop x-ray was taken, which confirmed that it appeared to be in  the appropriate level, but the C7 level was not visualized well enough to  see exactly where the C7-T1 disk was present even after 3 different plain  radiograph attempts and attempts at using the C-arm.  AP in the C-arm showed  satisfactory position.  After irrigation with saline solution, the platysma  was closed with 3-0 Vicryl, 4-0 Vicryl,  subcuticular skin closure, Tincture of Benzoin, Steri-Strips, 4x4s, tape,  and a soft cervical collar with a collar extender.  Instrument count and  needle count was correct.  The patient was extubated and transferred to the  recovery room in stable condition.      Mark C.  Ophelia Charter, M.D.  Electronically Signed     MCY/MEDQ  D:  05/10/2005  T:  05/10/2005  Job:  [id number]

## 2011-02-12 NOTE — H&P (Signed)
[name], [name]              ACCOUNT NO.:  [id number]   MEDICAL RECORD NO.:  [id number]          PATIENT TYPE:  INP   LOCATION:  4743                         FACILITY:  [facility]   PHYSICIAN:  Gerrit Friends. Dietrich Pates, MD, FACCDATE OF BIRTH:  August 01, 1946   DATE OF ADMISSION:  11/17/2006  DATE OF DISCHARGE:                              HISTORY & PHYSICAL   SUMMARY OF HISTORY:  [name] is a 65 year old white female who was  referred to the hospital from home after she had contact the office  because of chest discomfort.   Since discharge on September 01, 2006, she has been doing fairly well from  a cardiac standpoint until this past Tuesday. At approximately 3 a.m.,  she was awakened with anterior chest squeezing sensation radiating into  the neck, not associated shortness of breath, nausea, vomiting, or  diaphoresis.  She took a sublingual nitroglycerin.  All in all, her  discomfort lasted less than 10 minutes.  Similar occurrence occurred on  Wednesday at 5 a.m., awakening her from sleep, as well as Wednesday  afternoon while folding clothes.  Each episode she gave approximately 3  on a scale of 0-10, and all were relieved with one sublingual  nitroglycerin within 10-15 minutes.  However, today at approximately  2:00 while putting groceries away she developed the same symptoms.  However, these were worse.  She gave it a 5 on a scale of 0-10.  This  radiating into her left arm, and she did have associated shortness of  breath.  She denied nausea, vomiting, and diaphoresis.  She took two  sublingual nitroglycerin. Her discomfort lasted approximately 30  minutes.  She feels her discomforts that she has been experiencing are  similar to a myocardial infarction that she experienced in December  2007. She called the office, and she was told that she would need to be  admitted to the hospital for cardiac catheterization.  This upset her  somewhat, and at approximately 3:00 she had another  episode of chest  discomfort which was relieved with one sublingual nitroglycerin.   PAST MEDICAL HISTORY:   ALLERGIES:  1. MORPHINE.  2. VICODIN.  3. CODEINE.   MEDICATIONS:  1. Plavix 75 mg daily.  2. Lipitor 80 mg at bedtime.  3. Lopressor 50 mg b.i.d.  4. Zoloft 100 mg at bedtime.  5. Lisinopril 10 mg daily.  6. Prilosec over the counter 20 mg daily.  7. Aspirin 325 daily.  8. Nitroglycerin 0.4 p.r.n.   PAST MEDICAL HISTORY:  1. Hyperlipidemia.  2. Hypertension.  3. Arthritis.  4. GERD.  5. Left eye macular degeneration.  6. She had a non-ST segment elevated myocardial infarction on September 19, 2006. Cardiac catheterization at that time showed a 99 and 88%      proximal and mid-LAD, 40% AV circumflex, 50% OM, 30% proximal RCA.      EF was 65%, without wall motion abnormalities.  She underwent drug-      eluting stenting to the LAD x2.  This was complicated by a septal      perforator  that was jailed.   SURGICAL HISTORY:  1. Cholecystectomy.  2. Knee surgery.  3. Diskectomy.   SOCIAL HISTORY:  She resides in [place] with her second husband.  She  is retired from [employer] for at least 25 years. Prior to this, she was an  [employer] consultant.  She has two sons, six grandchildren, no great-  grandchildren. She has not smoked since November 2007. Prior to this,  she smoked at least a pack a day for 35 years.  She drinks a rare glass  of wine. Denies drugs, herbal medications.  Maintain low-salt and fat  diet. She has not exercised since she had the flu.   FAMILY HISTORY:  Mother is deceased at age 38 with gastric cancer,  diabetes, father at age 69 with lung cancer.  She has one brother  deceased at age 32 secondary to MVA.  She has three sisters living.  One  is 15, has recently had a stroke.  Her other sisters, ages 63 and 36,  are alive and well.   REVIEW OF SYSTEMS:  Notable for flu symptoms approximately 2-3 weeks  ago, which were accompanied by general  arthralgias, nausea, vomiting,  and diarrhea.  These symptoms have resolved. She has a right cataract,  hearing loss in the left ear, upper and lower partial dentures, bruises  easily, nocturia, postmenopausal, depression and anxiety; arthralgias in  the hips, hands, and knees; GERD, and abdominal discomfort.  All other  systems are unremarkable.   PHYSICAL EXAMINATION:  GENERAL:  Well-nourished, well-developed,  pleasant white female in no apparent distress.  She is somewhat anxious  about pending catheterization.  VITAL SIGNS:  Temperature is 98.2, blood pressure 147/80, pulse 63 and  regular, respirations 22 and regular, 99% saturation on room air.  NECK:  Supple, without thyromegaly, adenopathy, or JVD.  She does have a  left carotid bruit.  No subclavian bruits.  CHEST:  Symmetrical excursion.  Breath sounds were slightly diminished,  but clear to auscultation.  HEART:  PMI is not displaced.  Regular rate and rhythm.  I do not  appreciate any murmurs, rubs, clicks, or gallops,  The remainder of her  pulses are symmetrical and intact, without abdominal or femoral bruits.  SKIN/INTEGUMENT:  Intact, with some upper extremity bruising.  ABDOMEN:  Rounded.  Bowel sounds present, without organomegaly, masses  or tenderness.  EXTREMITIES:  Negative cyanosis, clubbing, or edema.  Peripheral pulses  are symmetrical, intact.  MUSCULOSKELETAL/NEUROLOGIC:  Unremarkable.   Chest x-ray, EKG, and labs are pending.   IMPRESSION:  1. Acute coronary syndrome.  2. Hypertension.  3. Anxiety history, per past medical history.   DISPOSITION:  We will admit the patient to 4700 and continue her home  medications, and order a chest x-ray, EKG, and our usual labs to rule  out myocardial infarction.  I will start her on IV heparin and a nitrate  preparation.  If she has further chest discomfort, IV nitroglycerin should be started, and if she has any EKG changes 2b3a should be  started.  I have placed  her on the catheterization schedule as an add-on  for November 18, 2006.      Joellyn Rued, PA-C      Gerrit Friends. Dietrich Pates, MD, [facility]  Electronically Signed    EW/MEDQ  D:  11/17/2006  T:  11/18/2006  Job:  [id number]   cc:   Franchot Heidelberg, M.D.  Salvadore Farber, MD

## 2011-02-12 NOTE — H&P (Signed)
NAMEGLORIANA, [name]      ACCOUNT NO.:  [id number]   MEDICAL RECORD NO.:  [id number]          PATIENT TYPE:  INP   LOCATION:  3707                         FACILITY:  [facility]   PHYSICIAN:  Rod [place], MD     DATE OF BIRTH:  [birth year]   DATE OF ADMISSION:  09/18/2006  DATE OF DISCHARGE:                              HISTORY & PHYSICAL   CHIEF COMPLAINT:  Chest pain.   HISTORY OF PRESENT ILLNESS:  [name] is a pleasant 65-year-  old female with a history of tobacco use who presents to the emergency  department at an outside hospital with complaints of chest pain. On  Friday, the patient had an episode of left upper extremity pain, chest  pressure that lasted for 1 hour. The patient had similar complaints on  Saturday night that occurred with exertion. The patient had another  episode of chest pressure that was worse tonight, left upper extremity  pain, neck pain, associated shortness of breath, nausea and diaphoresis.  These symptoms lasted for approximately 90 minutes, and was relieved by  sublingual nitroglycerin at the outside hospital. At the outside  hospital the patient received aspirin, Plavix, Lopressor, nitroglycerin,  Lovenox 1 mg/kg subcutaneously at 2232, and potassium chloride.  Currently, the patient is chest pain free.   PAST MEDICAL HISTORY:  1. Diskectomy.  2. Knee surgery.   MEDICATIONS:  None.   ALLERGIES:  CODEINE.   SOCIAL HISTORY:  The patient smoked cigarettes until 1 month ago and  quit, is married, lives in [place].   FAMILY HISTORY:  Father died with lung cancer, mother died with gastric  cancer.   REVIEW OF SYSTEMS:  All systems reviewed in detail and are negative  except as noted in the history of present illness.   PHYSICAL EXAMINATION:  VITAL SIGNS AT OUTSIDE HOSPITAL:  Initial blood  pressure 193/107, last blood pressure 132/73, heart rate in the 80's,  respiratory rate 18, oxygen saturation 100%. Temperature 98.4.  GENERAL: A well-developed, well-nourished female, alert and oriented x3,  in no apparent distress.  HEENT: Atraumatic, normocephalic. Pupils are equal, round and react to  light. Extraocular movements are intact. Oropharynx clear.  NECK: Supple, no adenopathy, no JVD, no carotid bruits.  CHEST: Lungs are clear to auscultation bilaterally with equal bilateral  breath sounds.  CORONARY: Regular rhythm, normal rate, normal S1 and S2, no murmurs,  rubs, or gallops, 2+ dorsalis pedis pulses bilaterally, 2+ femoral  pulse, no femoral bruits.  ABDOMEN:  Soft, nontender, nondistended, active bowel sounds, no  hepatosplenomegaly.  EXTREMITIES:  No clubbing, cyanosis or edema.  NEUROLOGIC: No focal deficits.  SKIN: No rashes.   EKG at the outside hospital showed normal sinus rhythm, nonspecific ST  changes.   LABS AT OUTSIDE HOSPITAL:  White blood cell count 11.2 thousand,  hematocrit 44, platelet count 426,000. Sodium 137, potassium 2.9,  chloride 101, bicarb of 26, BUN 11, creatinine 0.8, glucose 111.  Myoglobin 87, 108. Troponin 0.09, 0.11. CK-MB 2.5, 2.4.   IMPRESSION:  A 65 year old female who presents to the outside hospital  with symptoms concerning for unstable angina along with uncontrolled  hypertension.   PLAN:  1. Admit the patient to telemetry, serial cardiac enzymes, EKG daily,      aspirin, Lipitor, Lisinopril, Lopressor. The patient received      Lovenox at 2242 Hr. At the outside hospital, will hold. Lipid      panel.  2. Pulmonary follow-up, chest x-ray results from the outside hospital.  3. Fluids, electrolytes, nutrition, n.p.o., comprehensive metabolic      panel, magnesium level, normal saline at 75 cc/Hr.  4. Hematologic. Check CBC and coagulation panel.  5. Endocrine. Check thyroid function test.  6. Anticipate heart catheterization in the morning given her symptoms      that are concerning for unstable angina.      Rod [place], MD  Electronically  Signed     TRK/MEDQ  D:  09/19/2006  T:  09/19/2006  Job:  [id number]

## 2011-02-12 NOTE — Consult Note (Signed)
[name], [name]              ACCOUNT NO.:  [id number]   MEDICAL RECORD NO.:  [id number]          PATIENT TYPE:  INP   LOCATION:  2312                         FACILITY:  [facility]   PHYSICIAN:  [name], M.D.DATE OF BIRTH:  Jan 11, 1946   DATE OF CONSULTATION:  DATE OF DISCHARGE:                                 CONSULTATION   REASON FOR CONSULTATION:  Elevated creatinine.   HISTORY:  [name] a 65 year old white female with history of  hypertension, arthritis and coronary disease.  She was admitted on  February 21 to Dr. Marvel Plan service with acute coronary syndrome and  chest pain.  She had a heart catheterization on February 22 showing new  lesions proximal to the recently treated stenosis of the LAD.  She was  referred to surgeons who felt that she needed surgery quickly due to  critical anatomy with a 99% LAD obstruction.  On February 25, she had  normal renal function at baseline with a creatinine of 0.8.  On February  25, the patient underwent four-vessel bypass.  She had previously been  stented to the LAD within the past few months.  She required a platelet  transfusion, she had been on Plavix and was bleeding.  Postoperatively,  she had development of rigors with cherry red urine and it was suspected  that she had had a hemolytic transfusion reaction.  Hematocrit was down  to 14%, protime up to 20 and patient was bleeding heavily from her chest  tubes.  She was seen by the hematology service who suspected that she  had reaction to the platelet transfusion which may have been  contaminated with some red blood cells.  They recommended aggressive  FFP, platelet and blood transfusion as well as vitamin K.  The patient's  urine cleared up promptly, but she developed a rise in creatinine over  the next several days up to 1.9 on February 26 up to 2.2 on February 27  up to 4.9 on February 29 and up to 5.8 on today, March 1.  She had  markedly elevated liver enzymes  with SGOT of 12,000, SGPT greater than  10,000, bilirubin of 3.3 at peak and an INR that improved with FFP given  over 24 to 48 hours.  Urine was clear by the 27th.  Patient continued to  improve and was transferred to stepdown unit, however the creatinine has  not gotten any better.  Urine output for the last several days was 870  mL on the 27th, 1200 mL on the 28th and 2230 on the 29th which was  yesterday, she has had 1500 out so far today on March 1 and she is not  receiving any Lasix.   MEDICATIONS:  1. Lopressor 25 b.i.d.  2. Catapres patch two every weeks.  3. Zoloft.  4. Dulcolax.  5. Colace.  6. Protonix.  7. Mephyton which is vitamin K.  8. Several p.r.n. drips.   PAST MEDICAL HISTORY:  1. Hypertension.  2. GERD.  3. Hyperlipidemia.  4. Arthritis.  5. CAD with two LAD stents placed on September 19, 2006.  PAST SURGICAL HISTORY:  1. Cholecystectomy.  2. Knee surgery.  3. Discectomy.   SOCIAL HISTORY:  Patient lives in [place] with a second husband,  retired, two sons, quit smoking November of 2007.   FAMILY HISTORY:  Noncontributory.   REVIEW OF SYSTEMS:  GENERAL:  Denies fever, chills, sweats.  ENT:  Denies hearing loss, visual change.  CARDIORESPIRATORY:  Denies  shortness of breath or cough currently.  ABDOMINAL:  Denies abdominal  pain, nausea, vomiting.  EXTREMITIES:  Denies myalgia or arthralgia.  NEUROLOGIC:  Denies focal numbness or weakness.   PHYSICAL EXAMINATION:  VITAL SIGNS:  Blood pressure is normal, heart  rate is 72.  GENERAL:  Patient is awake, alert and follows commands.  There is  decreased output from the left chest tube drain.  CHEST:  Clear throughout.  CARDIAC:  Regular rate and rhythm without murmur, rub of gallop.  ABDOMEN:  Soft, nontender, no masses.  EXTREMITIES:  She has 1 to 2+ pitting edema, the right arm is 2+, left  arm 1+, legs are 1+ in the thighs.  SKIN:  Without rash.  NEUROLOGIC:  Grossly nonfocal motor exam.    LABS FROM TODAY:  Sodium 135, potassium 3.3, BUN 81, creatinine 5.8, it  was 4.9 yesterday morning and 3.6 the day before, albumin 2.6, AST down  to 406 from 5000 yesterday, ALT is down to 2200 from 4800 yesterday, LDH  is down to 507 from 4496 yesterday, total bilirubin down to 2.4, alk  phos 238.  The white blood count is slightly elevated at 14.0,  hemoglobin 10.5, INR is 17 today, maximum was 31.  Blood gas yesterday  was 7.45/36/60.   IMPRESSION:  1. Acute renal failure due to acute tubular necrosis and      hemoglobinuria after severe hemolytic reaction.  Hemolysis resolved      over 24 to 48 hours, but the patient most likely sustained a      significant tubular injury, in other words, i.e. acute tubular      necrosis, from the hemoglobinuria.  She is now oliguric and      treatment is supportive care.  There is no indication for dialysis      at this point, we will follow her closely, she should recover fully      over time from a tubal injury.  2. Volume excess with mild-to-moderate peripheral edema.  3. Status post hemolytic transfusion reaction.  4. Markedly elevated liver enzymes, improving.  5. Hypertension, chronic on Lopressor and Catapres patch in the      hospital, well controlled.  6. Acute coronary syndrome status post coronary bypass grafting on      February 25 and heart cath on February 22.   RECOMMENDATIONS:  1. Continue to avoid nephrotoxins.  2. Check daily creatinine.  3. Check urine sodium and urine creatinine.      [name], M.D.  Electronically Signed     RDS/MEDQ  D:  11/26/2006  T:  11/27/2006  Job:  [id number]

## 2011-02-12 NOTE — Cardiovascular Report (Signed)
NAMESHIRLY, [name]              ACCOUNT NO.:  [id number]   MEDICAL RECORD NO.:  [id number]          PATIENT TYPE:  INP   LOCATION:  2022                         FACILITY:  [facility]   PHYSICIAN:  Salvadore Farber, MD  DATE OF BIRTH:  01-31-1946   DATE OF PROCEDURE:  11/18/2006  DATE OF DISCHARGE:                            CARDIAC CATHETERIZATION   PROCEDURE:  Left heart catheterization, left ventriculography, coronary  angiography.   INDICATIONS:  [name] is a 65 year old lady who suffered a non-ST  elevation myocardial infarction on September 19, 2006.  I  placed drug-  eluting stents in both the proximal and mid-LAD.  She did well after the  procedure until earlier this week  when she began having chest  discomfort at rest.  She was admitted to hospital where she ruled out  for myocardial infarction.  She was referred for diagnostic angiography.   PROCEDURAL TECHNIQUE:  Informed consent was obtained.  Under 1%  lidocaine local anesthesia, a 5-French sheath was placed in the right  common femoral artery using the modified Seldinger technique.  Diagnostic angiography and ventriculography were performed using JL4,  JR4, and pigtail catheter.  This demonstrated a 90% stenosis in the  ostium of the LAD, proximal to the previously placed stents.  The stents  remain widely patent.  The patient was then transferred to the holding  room in stable condition having tolerated this procedure well.   COMPLICATIONS:  None.   FINDINGS:  1. The LV 150/15/19.  EF 65% without regional wall motion abnormality.  2. No aortic stenosis or mitral regurgitation.  3. Left main angiographically normal.  4. LAD:  A moderate-sized vessel giving rise to a small diagonal.      There is a 90% stenosis proximal to the previously placed stent,      approaching the ostium of the vessel.  The stents in the proximal      and mid vessel are widely patent.  There is TIMI-III flow.  5. Circumflex:  A  moderate-sized vessel giving rise to two marginals.      The AV groove circumflex has a 40% stenosis after the origin of the      first marginal.  The first marginal has a 70-80% stenosis which has      progressed since over the past 2 months.  6. RCA:  A large, dominant vessel.  There is a 30% stenosis      proximally.   IMPRESSION/PLAN:  The patient has developed new stenosis in the proximal  left anterior descending, proximal to the previously placed stents.  Due  to the ostial location of this as well as the progression of the disease  in the circumflex, I have recommended consideration of bypass surgery.      Salvadore Farber, MD  Electronically Signed     WED/MEDQ  D:  12/09/2006  T:  12/11/2006  Job:  [id number]

## 2011-02-12 NOTE — Assessment & Plan Note (Signed)
[facility]                            CARDIOLOGY OFFICE NOTE   NAME:[name], [name] CASTIGLIA                     MRN:          [id number]  DATE:12/23/2006                            DOB:          11/19/1945    PRIMARY CARE PHYSICIAN:  Dr. Franchot Heidelberg.   REASON FOR PRESENTATION:  Evaluate patient with chest pain, status post  CABG.   HISTORY OF PRESENT ILLNESS:  [name] a 65 year old white female  who was discharged on March 17 after bypass.  She had a complicated  course with acute liver dysfunction, transfusion reaction, renal  insufficiency.  She required a thoracentesis for right effusion.  She  said she was feeling relatively well when she was discharged.  However,  she ran out of oxycodone.  She has not been taking her hydrocodone,  trying to save it for nighttime.  It seems to be coincident with this  reduction in pain medications she has had an increase over the past 4-5  days in chest discomfort.  It is substernal and sharp.  It is not like  her previous angina.  She has some radiating around under her left  breast and axilla.  It is with taking a deep breath or certain positions  such as lying flat.  It is 6 out of 10 in intensity.  It was gradual  onset.  She has had a very mild cough.  Has not had any fevers or  chills.  She is not bringing anything with the cough.  She is not short  of breath.  She does not have any PND or orthopnea.  She has been  ambulating and active.  She has had no leg swelling or pain.   PAST MEDICAL HISTORY:  1. Coronary artery disease, status post CABG (95% complex proximal LAD      stenosis, obtuse marginal 70% - 80% stenosis, 70% circumflex      stenosis, 70% posterolateral branch of the right coronary artery      stenosis.  CABG with a LIMA to the LAD, sequential vein graft in      obtuse marginal and distal circumflex, saphenous vein graft to      posterolateral).  2. Transient renal failure.  3.  Right pleural effusion.  4. Acute liver dysfunction.  5. Hyperlipidemia.  6. Hypertension.  7. Arthritis.  8. Gastroesophageal reflux disease.  9. Macular generation.   ALLERGIES:  VICODIN, CODEINE.   MEDICATIONS:  1. Lipitor 20 mg nightly.  2. Metoprolol 25 b.i.d.  3. Prilosec.  4. Aspirin.   REVIEW OF SYSTEMS:  As stated in the HPI and otherwise negative for  other systems.   PHYSICAL EXAMINATION:  The patient in mild distress when in certain  positions and taking deep breaths.  Blood pressure 124/80, heart rate 56  and regular, weight 164 pounds, body mass index 28.  HEENT:  Eyelids unremarkable, pupils equally round and reactive to  light, fundi not visualized, oral mucosa unremarkable.  NECK:  No jugular venous distention at 45 degrees, carotid upstroke  brisk and symmetrical, no bruits, no thyromegaly.  LYMPHATICS:  No adenopathy.  LUNGS:  Mildly decreased breath sounds on the right base, no wheezing or  crackles.Marland Kitchen  BACK:  No costovertebral angle tenderness.  CHEST:  Tenderness to palpation under the left axilla and left breast,  well-healed sternotomy scar without mobility, drainage, or erythema.Marland Kitchen  HEART:  PMI not displaced or sustained, S1 and S2 within normal limits,  no S3, no S4, no clicks, no rubs, no murmurs.  ABDOMEN:  Flat, positive bowel sounds normal in frequency and pitch, no  bruits, no rebound, no guarding, no midline pulsatile mass, no  hepatomegaly, splenomegaly.  SKIN:  No rashes, no nodules.  EXTREMITIES:  With 2+ pulses, no edema, no cyanosis, no clubbing, no  calf tenderness or pain.  NEURO:  Oriented to person, place and time, cranial nerves II-XII  grossly intact, motor grossly intact.   Chest x-ray status post sternotomy with a mild right effusion but no air  space disease.   EKG:  Sinus bradycardia, rate 56, right axis deviation, anterior T wave  inversions unchanged, is nonspecific, she does have some lateral T wave  inversion.   1.  Chest discomfort.  The patient's chest discomfort is exacerbated by      deep breathing or movement.  It is coincidental with her taking      less pain medication and in particular running out of the      oxycodone.  She has not been taking any of the pain medicine now      during the day.  She also slipped out of a chair and fell about 4      days prior to the onset of this.  At this point, there is no      evidence of for pneumonia.  There is no high risk features for      pulmonary embolism.  This is not myocardial infarction.  The      patient will be managed with pain medication.  I did give her a      limited prescription for oxycodone to take 3 times a day p.r.n.  I      told her to come to the emergency room this weekend if her pain is      not improved with this.  She does have followup with [place] on Monday      should she choose to keep this.  She also is to see Dr. Tyrone Sage on      Friday.  2. Followup as above.     Rollene Rotunda, MD, [facility]  Electronically Signed    JH/MedQ  DD: 12/23/2006  DT: 12/23/2006  Job #: [id number]

## 2011-02-12 NOTE — Op Note (Signed)
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   PROCEDURE PERFORMED:  Coronary artery bypass grafting x4 with left  internal mammary artery to the left anterior descending coronary artery,  sequential reverse saphenous vein graft to the obtuse marginal and  distal circumflex, reverse saphenous vein graft to the posterolateral  branch of the right coronary artery with right thigh endovein  harvesting.   SURGEON:  Sheliah Plane, MD   FIRST ASSISTANT:  Salvatore Decent. Dorris Fetch, M.D.   SECOND ASSISTANT:  Coral Ceo, P.A.   BRIEF HISTORY:  Patient is a 65 year old female who in late 2007  underwent placement of two drug-coated stents in the proximal and mid  LAD.  She had been maintained on Plavix and presents now with  approximately 4-5 days of rest angina and underwent cardiac  catheterization, which demonstrated complex greater than 95% stenosis of  the proximal and mid LAD.  In addition, progression of disease in the  first obtuse marginal to 70-80%, 70% in the circumflex, and 70% in the  posterolateral branch of the right coronary artery.  The patient had  been on Plavix, however, and was now being maintained on Integrilin  because of her unstable angina symptoms.  Ideally, waiting 5-7 days for  washout of Plavix was discussed with the patient; however, because of  the critical nature of her anatomy and her symptoms, it was recommended  that we proceed  earlier.  Risks and options were discussed with the  patient in detail, including the risk of bleeding, blood transfusion,  myocardial infarction, stroke, and renal insufficiency.  The patient  agreed and signed informed consent.   DESCRIPTION OF PROCEDURE:  With Swan-Ganz and arterial line monitors in  place, patient underwent general endotracheal anesthesia without  incidence.  The skin of the chest and legs was prepped with Betadine and  draped in the usual sterile manner.  Using a Guidant endovein harvesting  system, a vein was harvested from the right thigh.  It was of good  quality and caliber.  A median sternotomy was performed.  The left  internal mammary artery was dissected down as a pedicle graft.  The  distal artery was divided and had good free flow.  The pericardium was  opened.  Overall ventricular function appeared preserved.  Patient was  systemically heparinized.  The ascending aorta and the right atrium were  cannulated.  The aortic root bent cardioplegia needle was introduced  into the ascending aorta.  The patient was placed on cardiopulmonary  bypass at 2.4 L/min/m2.  Sites of anastomosis were selected and  dissected  out of the epicardium.  The patient's body temperature was  cooled to 30 degrees.  Aortic cross clamp was applied, and 500 cc of  cold blood potassium cardioplegia  was administered with rapid diastolic  arrest of the heart.  Myocardial septal temperature was monitored  throughout the cross clamp.  Attention was turned first to the  posterolateral branch of the right coronary artery, which was opened.  It was a slightly small vessel but admitted a 1.5 mm probe.  Using a  running 7-0 Prolene, distal anastomosis was performed.  The heart was  then elevated, and the first OM and distal circ were identified.  Both  were larger vessels, 1.8 mm in size.  Using a diamond-type, side-to-side  anastomosis was carried out with the first obtuse marginal.  The  distal  extent of the same vein was then carried to the distal circumflex, which  was opened, and using a running 7-0 Prolene, distal anastomosis was  performed.  Attention was then turned to the LAD, which was opened, and  distal third of the vessel past the second stent.  Using a vessel,  admitted a 1.5 mm probe proximally.  Using a running 8-0 Prolene, the  left internal mammary artery was anastomosed to the left anterior  descending coronary artery.  With release of the bulldog on the mammary  artery, there was rise in myocardial septal temperature.  The bulldog  was placed back on the mammary artery.  With the cross clamp still in  place, two punch aortotomies were performed, and each of the two vein  grafts were anastomosed to the ascending aorta.  Air was evacuated from  the grafts, and the aortic cross clamp was removed.  Total cross clamp  time of 75 minutes.  The patient spontaneously converted to a sinus  rhythm.  Sites of anastomosis were inspected and were free of bleeding.  The patient was then ventilated and weaned from cardiopulmonary bypass  without difficulty.  She remained hemodynamically stable.  Protamine  sulfate was administered because of diffuse oozing, even after the  administration of protamine and the known Plavix exposure, platelets  were administered.  With the operative field to hemostatic, a left  pleural tube and a single Blake mediastinal drain were left in place.  The pericardium was reapproximated, and the sternum was closed with a #6  stainless steel wire.  The fascia was closed with interrupted 0 Vicryl,  running 3-0 Vicryl in the subcutaneous tissue and 4-0 subcuticular  stitch in the skin edges.  Dry dressings were applied.  Sponge and  needle count was reported as correct at the completion of the procedure.  The patient tolerated the procedure without obvious complication and was transferred to the surgical intensive care unit for further   postoperative care.      Sheliah Plane, MD  Electronically Signed     EG/MEDQ  D:  11/21/2006  T:  11/21/2006  Job:  [id number]   cc:   Salvadore Farber, MD

## 2011-02-12 NOTE — Consult Note (Signed)
[name], [name]              ACCOUNT NO.:  [id number]   MEDICAL RECORD NO.:  [id number]          PATIENT TYPE:  INP   LOCATION:  2312                         FACILITY:  [facility]   PHYSICIAN:  Genene Churn. Granfortuna, M.D.DATE OF BIRTH:  08-21-1946   DATE OF CONSULTATION:  11/22/2006  DATE OF DISCHARGE:                                 CONSULTATION   HEMATOLOGY CONSULTATION:  This is an urgent hematology consultation requested to evaluate this  lady for significant hemorrhage and a probable acute hemolytic  transfusion reaction occurring a few hours following four-vessel  coronary bypass surgery.   [name] is a 65 year old woman with hypertension, hyperlipidemia and  coronary artery disease.  She sustained a non-ST wave myocardial  infarction on September 19, 2006.  Cardiac catheterization showed  significant stenosis of the proximal LAD with 88% occlusion, 40%  occlusion of the circumflex, 50% of the obtuse marginal artery, 30% of  the right coronary. Two drug-eluting stents were placed in the LAD.   On November 17, 2006, she developed accelerated anginal chest pain and  was admitted for further evaluation.  She underwent repeat  catheterization on November 18, 2006.  Ejection fraction was 60%.  There  was now 90% stenosis of the LAD, 70% of the obtuse marginal.  She was  felt to need urgent coronary bypass surgery.   Medications on admission included  Plavix 75 mg daily and aspirin 325 mg  daily.  At time of current admission on February 21 and she was put on  full-dose heparin and Integrilin.  Plavix was discontinued on February  22.  Integrilin stopped on November 21, 2006.  Heparin changed to  Lovenox at that time.  Aspirin was continued.   She was taken to emergency bypass surgery on November 21, 2006.  The  surgery proceeded uneventfully until the termination of the procedure.  After protamine reversal of heparin, there was still diffuse bleeding.  Estimated blood loss  up to that point was 6100 mL.  In view of the known  antiplatelet effects of the recent aspirin, Plavix and Integrilin, a  platelet transfusion was given.  She received 1200 mL of Cell Saver of  blood but did not require an initial red blood cell transfusion.  A 5 g  IV dose of Amicar was given.   The the patient return to the recovery area.  A second unit of platelets  was ordered.  Platelets were given at approximately 1700 hours on  February 25.  Near the completion of the transfusion she was  noted to  have sudden onset of cherry red urine, rigors, and hypotension.  Transfusion was stopped and a transfusion reaction protocol was  initiated.  It was found that the second unit of platelets contained  type O, Rh positive plasma in a volume of almost 600 mL of plasma.  The  patient's blood type is A+.  This second unit was dispensed on an  emergency basis.  There was no A positive platelet product available at  that time per my discussion with the blood bank supervisor.   Additional laboratory data obtained  as part of the transfusion  evaluation included coagulation studies.  Of note, baseline coagulation  studies were normal with a pro time of 13 seconds and a PTT of 31  seconds recorded on February 21.  Immediate postop values done at 1352  hours on February 25 showed pro time up to 21 seconds and PTT of 39  seconds.  Fresh frozen plasma was administered.  However, by the morning  of November 22, 2006, the pro time was up to 50.7 seconds with a PTT of  63 seconds.  Liver chemistries showed a significant change with a rise  in the SGOT to 1864, SGPT 2005, bilirubin total 3.7, direct 1.1.  Over  the course of the day on November 22, 2006, there was further evidence  of liver dysfunction with a rise in the SGOT to 11,980, SGPT of 6672,  and serum LDH of greater than 10,000.   Fibrinogen level initially 174 (normal 204-475) done at 9:55 a.m.  Subsequent value done at 1752 hours was 222.   A haptoglobin was  significantly decreased at 19.  Of note, a reticulocyte count was only  1.4%.   At the time I was called to see the patient she had two chest tubes, one  draining the mediastinum and one draining the pleura.  There was copious  bleeding from both tubes, approximately 200 mL/hr.   PHYSICAL EXAMINATION:  A critically ill patient who is intubated and  heavily sedated.  Median sternotomy wound is dry.  Two chest tubes  exiting from the subxiphoid region.  Large area of ecchymosis about 25 x  25 cm on the anterior abdominal wall from recent Lovenox injections.  Small hematoma in the right groin area where recent cardiac  catheterization was done.  No active bleeding at that site.  Extremities  are warm and well-perfused.  Distal pulses are 2+ and symmetric.  The  pupils equal, reactive to light.  The conjunctivae are jaundiced and  edematous.  She is moving all extremities and has good hand grip  bilaterally.  There is a Foley catheter which is draining clear yellow  urine.  No oozing of blood from wound sites.   Additional pertinent lab data include a positive direct antiglobulin  test both for IgG and C3 complement.   IMPRESSION:  1. Acute hemolytic transfusion reaction precipitated by a second      platelet transfusion given postoperatively.  Reaction appears to be      due to contamination of the plasma in which the platelets are      suspended with O+ red cells in an A positive patient.  2. DIC initiated by the acute hemolytic transfusion reaction.  3. Advanced acute liver dysfunction, likely multifactorial, with      components from DIC and transient hypotension with blood pressures      as low as 50.  4. Severe coagulopathy secondary to #1, 2 and 3.  Of note, INR is an      irrelevant piece of data in somebody who is not on Coumadin      anticoagulation.  This is a mathematical adjustment factor to     equilibrate different reagents in different labs.  The  critical      value here is the absolute pro time, which is significantly      elevated above normal reflecting severe acute hepatic damage.  5. Additional contribution to active bleeding from qualitative      platelet dysfunction due to recent aspirin,  Plavix and Integrilin.   At this point the initiating factor, that is, the acute hemolytic  anemia, seems to be resolving with a normal reticulocyte count and  clearing of her urine.  We are now dealing with secondary effects of DIC  and qualitative platelet dysfunction.   RECOMMENDATIONS:  1. Aggressive replacement of fresh frozen plasma, platelets and blood      transfusions in attempt to reverse the coagulopathy.  I have also      ordered vitamin K administration.  Of note, given this lady's      weight of 80 kg with estimated plasma volume of 3200 mL, with      approximately 200 mL of plasma in 1 unit of fresh frozen plasma,      she would need 16 units of FFP for at least a 50% correction of her      coagulopathy.  Therefore, I am giving an additional 4 units in      addition to the 2 units she already got this morning and then will      give another 2 units every 6 hours until she is stable.  2. If she continues to have significant uncontrolled bleeding despite      platelets, plasma and red blood cell transfusions, I would give her      a dose of recombinant factor VIIa 90 mcg/kg, which at her weight      would be 7200 mcg IV over 20 minutes.  This dose may be repeated in      2 hours if the first dose does not cause hemostasis.  3. I would maintain good hydration in view of the hemolytic reaction      to protect her kidneys.  I would use forced saline/Lasix diuresis      if necessary to maintain urine output.   Thank you for this consultation.      Genene Churn. Cyndie Chime, M.D.  Electronically Signed     JMG/MEDQ  D:  11/23/2006  T:  11/23/2006  Job:  [id number]   cc:   Salvadore Farber, MD  Sheliah Plane, MD  Franchot Heidelberg, M.D.

## 2011-02-12 NOTE — Op Note (Signed)
NAME:  [name], [name]                       ACCOUNT NO.:  [id number]   MEDICAL RECORD NO.:  [id number]                   PATIENT TYPE:  AMB   LOCATION:  DSC                                  FACILITY:  [facility]   PHYSICIAN:  Feliberto Gottron. [name] Daniels, M.D.                DATE OF BIRTH:  May 15, 1946   DATE OF PROCEDURE:  03/09/2004  DATE OF DISCHARGE:                                 OPERATIVE REPORT   PREOPERATIVE DIAGNOSES:  Right knee lateral meniscal tear.   POSTOPERATIVE DIAGNOSES:  Right knee lateral meniscal tear.  Chondromalacia  of medial femoral condyle and a single loose body in the lateral gutter.   OPERATION PERFORMED:  Right knee arthroscopic partial lateral meniscectomy  and debridement of chondromalacia from medial femoral condyle with focal  grade 3 and removal of loose body.   SURGEON:  Feliberto Gottron. [name] Daniels, M.D.   ASSISTANTLaural Benes. Jannet Mantis.   ANESTHESIA:  General LMA.   ESTIMATED BLOOD LOSS:  Minimal.   FLUIDS REPLACED:  500 mL crystalloids.   DRAINS:  None.   TOURNIQUET TIME:  None.   INDICATIONS FOR PROCEDURE:  [name] a 65 year old woman who has had  prior arthroscopies of her left knee times three.  Each one was separated by  a couple of years and she had good interval pain relief.  In any event, she  now has pain in her right knee in a similar fashion.  Plain radiographs show  minimal arthritic changes.  She is felt to have a lateral meniscal tear  based on her clinical exam and had failed conservative treatment  with anti-  inflammatory medicine and exercise.  She is well aware of what she is  getting herself into.   DESCRIPTION OF PROCEDURE:  The patient was identified by arm band and taken  to the operating room at [facility] where appropriate  anesthetic monitors were attached and general LMA anesthesia was induced  with the patient in the supine position.  Lateral post applied to the table.  Right lower extremity prepped and  draped in the usual sterile fashion from  the ankle to the midthigh and then using a #11 blade, standard inferomedial  and inferolateral peripatellar portals were then made allowing introduction  of the arthroscope through the inferolateral portal and the outflow through  the inferomedial portal.  Diagnostic arthroscopy revealed little if any  chondromalacia of the patella of the trochlea.  She did have an active plica  that was resected incidentally.  Moving into the medial compartment, we  identified chondromalacia of the medial femoral condyle  focal grade 3 with  flap tears and this was debrided back to a stable margin with a 3.5 Gator  sucker shaver.  The ACL, PCL and the medial meniscus were intact .  On the  lateral side, the patient had a radial tear of the lateral meniscus,  posterolateral  and this was debrided back to a stable margin using a 3.5  Gator sucker shaver as well.  The articular cartilage on the lateral side  had minimal chondromalacia requiring little if any debridement.  The gutters  were cleared.  The scope was  taken medial and lateral to the PCL clearing the posterior compartments.  The knee was irrigated out with normal saline solution.  The arthroscopic  instruments were removed.  A dressing of Xeroform, 4 x 4 dressing sponges,  Webril and Ace wrap was applied.  The patient was awakened and taken to the  recovery room without difficulty.                                               Feliberto Gottron. [name] Daniels, M.D.    Frithjof.Angelica  D:  03/09/2004  T:  03/09/2004  Job:  [id number]

## 2011-02-12 NOTE — Op Note (Signed)
NAME:  [name], [name]                       ACCOUNT NO.:  [id number]   MEDICAL RECORD NO.:  [id number]                   PATIENT TYPE:  AMB   LOCATION:  DSC                                  FACILITY:  [facility]   PHYSICIAN:  Feliberto Gottron. [name] Daniels, M.D.                DATE OF BIRTH:  October 24, 1945   DATE OF PROCEDURE:  03/18/2003  DATE OF DISCHARGE:                                 OPERATIVE REPORT   PREOPERATIVE DIAGNOSES:  Left knee possible medial meniscal tear or  chondromalacia.   POSTOPERATIVE DIAGNOSES:  Left knee chondromalacia of patella, trochlea,  focal grade 3, and small lateral meniscal tear.   PROCEDURE:  Left knee arthroscopic debridement of chondromalacia and partial  arthroscopy lateral meniscectomy.   SURGEON:  Feliberto Gottron. [name] Daniels, M.D.   FIRST ASSISTANT:  Skip Mayer, P.A.-C.   ANESTHETIC:  General endotracheal.   ESTIMATED BLOOD LOSS:  Minimal.   FLUIDS REPLACEMENT:  800 cc of crystalloid.   DRAINS PLACED:  None.   TOURNIQUET TIME:  None.   INDICATIONS FOR PROCEDURE:  [name] a 65 year old woman with  catching, popping, and clicking in her left knee similar to pain she had a  few years ago when I last arthroscoped her knee.  She has failed  conservative treatment, anti-inflammatory medicines, exercise, and  observation, and now desires elective arthroscopic evaluation and treatment  of her left knee.   DESCRIPTION OF PROCEDURE:  The patient was identified by arm band and taken  to the operating room at [facility].  Appropriate anesthetic  monitors were attached, and general endotracheal anesthesia induced with the  patient in the supine position.  Lateral post applied to the table, and the  left lower extremity prepped and draped in the usual sterile fashion from  the ankle to the mid-thigh.  The inferomedial and inferolateral peripatellar  portals were then infiltrated with 2-3 cc of 0.5% Marcaine and epinephrine  solution, and using a #11  blade, standard inferomedial and inferolateral  peripatellar portals were then made.  The arthroscope was introduced through  the inferolateral portal, the outflow through the inferomedial portal.  Diagnostic arthroscopy was then undertaken with the following findings.  The  apex of the patella had grade 3 chondromalacia with some small flap tears,  and this was debrided back to stable margins using a 3.5 gator sucker  shaver.  The trochlea had a large area of grade 3 chondromalacia with  fissuring and cracking that was also debrided back to stable margins with  the 3.5 gator sucker shaver.  The medial compartment was in good condition  with the exception of some small loose bodies that were taken through the  outflow about 2-3 mm in size and primarily cartilage.  The ACL and the PCL  were intact in the notch.  On the lateral side, there was a degenerative  radial tearing of the lateral meniscus.  This  was debrided back to a stable  margin with a 3.5 gator sucker shaver.  The gutters were cleared.  The scope  was taken to the lateral side of the PCL clearing the posterior compartment.  The knee was washed out with normal saline solution.  The arthroscopic  instruments were removed, and a dressing of Xeroform, 4 x 4 dressing  sponges, Webril, and an Ace wrap applied.  The patient was then awakened and  taken to the recovery room without difficulty.                                               Feliberto Gottron. [name] Daniels, M.D.    Ovid Curd  D:  03/18/2003  T:  03/19/2003  Job:  [id number]

## 2011-02-12 NOTE — Discharge Summary (Signed)
[name], [name]      ACCOUNT NO.:  [id number]   MEDICAL RECORD NO.:  [id number]          PATIENT TYPE:  INP   LOCATION:  3742                         FACILITY:  [facility]   PHYSICIAN:  [name] Morton. Riley Kill, MD, FACCDATE OF BIRTH:  [birth year]   DATE OF ADMISSION:  09/18/2006  DATE OF DISCHARGE:  09/21/2006                               DISCHARGE SUMMARY   PRIMARY CARDIOLOGIST:  Dr. Randa Evens.   PRIMARY CARE PHYSICIAN:  Unfortunately, is not listed.   PROCEDURES PERFORMED DURING HOSPITALIZATION:  1. Cardiac catheterization on September 19, 2006.      a.     Successful PCI at 2 lesions in the LAD.  A moderate sized       diagonal with jailed and had TI, MI II flow with competition.  She       may have a small additional enzyme leak.   SECONDARY DIAGNOSES:  1. Non-ST elevation myocardial infarction.  2. Hypertension.  3. Hypercholesterolemia.  4. History of diskectomy.  5. History of knee surgery.  6. Continued tobacco abuse.   HISTORY OF PRESENT ILLNESS:  [name] is a 65 year old  Caucasian female with a history of tobacco abuse and arthritis who  presented to the emergency room at an outside hospital with complaints  of chest discomfort.  The patient had an episode of left upper extremity  pain, chest pressure lasted for 1 hour and reoccurred the following day.  The patient had worsening chest pain at that time.  The patient was seen  at that emergency room and brought to [facility] ER secondary to  continued chest discomfort.   HOSPITAL COURSE:  The patient was seen and examined by Dr. Joaquim Lai,  cardiology fellow and admitted to telemetry.  Serial cardiac enzymes,  daily EKGs were started.  She was started on Lovenox and scheduled for  cardiac catheterization as described above.   The patient did have cardiac catheterization on September 19, 2006.  The  patient had this completed by Dr. Randa Evens, please see above for  brief synopsis and Dr.  Melinda Crutch thorough note for more details.  The  patient did well post cardiac catheterization concerning any bleeding,  hematoma or infection at the cath site.  However, that evening after  receiving some morphine for some back pain, the patient had severe  abdominal pain, cramping and was given a GI cocktail, along with  nitroglycerin which helped relieved esophageal spasm which was believed  to be the cause, secondary to use of the morphine.  The patient states  any time she takes any medications that have codeine in it or morphine,  she has this reaction, but morphine was not listed on her allergy list.  After GI cocktail and nitroglycerin, the patient was able to rest  throughout the rest of the night with no further episodes of chest  discomfort or abdominal pain.   The patient was started on ACE inhibitor, lisinopril, during  hospitalization and was found to have an elevated potassium on second  day post catheterization with a potassium of 5.0.  Unfortunately, the  ACE inhibitor was continued and her potassium continued to rise with  a  potassium of 5.5 on day of discharge.  The patient had followup BMP  completed and results are pending prior to discharge.  Lisinopril was  discontinued.   The patient was also given instructions on smoking cessation with  encouragement to stop.   Vital sings on discharge, blood pressure 135/74, pulse 76, respirations  20, temperature 96.0.   LABS:  On discharge, hemoglobin 15.1, hematocrit 44.7, white blood cells  is 10.7, platelets 416.  PT 13.4, INR 1.0, PTT 42.  Sodium 133,  potassium 5.2 (patient is having a repeat of her BMET), chloride 101,  CO2 24, glucose 157, BUN 10, creatinine 0.9.  LFTs within normal limits.  Calcium 9.1.  Troponin, on day of admission, 0.86 and with followup at  0.53.  BNP 110.  Cholesterol 269, triglycerides 101, LDL 182, HDL 67.  T4 1.08.  TSH 5.507.   EKG, on discharge, normal sinus rhythm with a ventricular rate  of 70  beats per minute.   1. [name] scheduled for a followup appointment with Randa Evens on January 15 at 3:30 p.m.  2. The patient had been seen by cardiac rehab and is planned for      cardiac rehab post hospitalization, phase II.  3. Tobacco cessation has been recommended and patient has received      counseling concerning this.  4. The patient has been given post cardiac catheterization      instructions to notice groin site for hematoma, bleeding or signs      of infection.  5. The patient will have a followup BMET scheduled for Friday,      December 29 to evaluate potassium status post discharge.   ALLERGIES:  1. MORPHINE.  2. VICODIN.  3. CODEINE, CAUSING SEVERE ABDOMINAL CRAMPING.   DISCHARGE MEDICATIONS:  1. Enteric-coated aspirin 325 one p.o. daily (new prescription).  2. Lopressor 50 mg p.o. b.i.d. (new prescription).  3. Lipitor 80 mg at bedtime (new prescription).  4. Plavix 75 mg once a day (new prescription).  5. Nitroglycerin 0.4 mg sublingual q.5 minutes p.r.n. chest pain (new      prescription).   TIME:  Spent with patient, including physician time 35 minutes.      [name] Mare. Lyman Bishop, NP      [name] Morton. Riley Kill, MD, [facility]  Electronically Signed    KML/MEDQ  D:  09/21/2006  T:  09/22/2006  Job:  [id number]

## 2011-02-12 NOTE — Cardiovascular Report (Signed)
[name], [name]      ACCOUNT NO.:  [id number]   MEDICAL RECORD NO.:  [id number]          PATIENT TYPE:  INP   LOCATION:  3742                         FACILITY:  [facility]   PHYSICIAN:  Salvadore Farber, MD  DATE OF BIRTH:  1946/07/14   DATE OF PROCEDURE:  09/19/2006  DATE OF DISCHARGE:  09/21/2006                            CARDIAC CATHETERIZATION   PROCEDURE:  Left heart catheterization, left ventriculography, coronary  angiography, drug eluting stent placement of proximal left anterior  descending , drug eluting stent placement in the mid left anterior  descending, intravascular ultrasound of the left anterior descending.   INDICATIONS:  Ms. Rosch is a 65 year old woman who presents  with non-ST elevation myocardial infarction.  She is referred for  diagnostic angiography and possible percutaneous coronary intervention.   PROCEDURE TECHNIQUE:  Informed consent was obtained.  Under 1% lidocaine  local anesthesia, a 6-French sheath was placed in the right common  femoral artery using modified Seldinger technique.  Diagnostic  angiography and ventriculography were performed using JL-4, JR-4,  pigtail catheters.  This demonstrated a 99% stenosis of the proximal LAD  crossing both a septal and a diagonal. The diagonal was small.  There is  also an 80% stenosis of the mid LAD.  Left ventricular systolic function  was normal.  We decided proceed to percutaneous revascularization of  both LAD lesions.   Anticoagulation was initiated with bivalirudin.  ACT was confirmed to be  greater than 225 seconds.  A 6-French CLS 3.5 guide was advanced over  the wire and engaged in the ostium of the left main.  A Prowater wire  was advanced in the distal LAD without difficulty.  The more proximal of  the two lesions was then predilated using a 2.5 x 12 mm Maverick at 6  atmospheres.  I then advanced a 2.5 x 16 mm Taxus stent across the  distal lesion and deployed it at 16  atmospheres.  I then advanced a 2.5  x 20 mm Taxus stent across the more proximal lesion and deployed it at  14 atmospheres.  There was then TIMI I flow in the jailed diagonal.  I  gave intracoronary nitroglycerin.  I attempted to advance a BMW wire  into the septal perforator but was unsuccessful.  However, during these  attempts, flow improved to TIMI III in the septal.  The patient's pain  resolved.  I then proceeded to dilation of the distal lesion using a 2.5  x 15 mm Quantum at 18 atmospheres. I then post dilated the more proximal  lesion using a 2.75 x 20 mm Quantum at 16 atmospheres.  I proceeded to  intravascular ultrasound after the administration of intracoronary  nitroglycerin.  This demonstrated both stents were fully opposed but  were not fully expanded in segments of calcification.  I then proceeded  to further post dilation of the distal stent using a 2.25 x 12 mm  Quantum at 26 atmospheres.  Repeat intravascular ultrasound suggested  that the stent could be somewhat larger.  I then proceeded to further  post dilation using the 2.5 x 12 mm Quantum in the distal lesion at 26  atmospheres.  I then repeated the intravascular ultrasound.  This  demonstrated much improved expansion of the distal stent.  I then  proceeded to further post dilate the distal stent. As there had been  sluggish flow in the septal perforator, I gave 200 mcg of intracoronary  verapamil before further post dilation.  I then post dilated the  proximal stent using a 2.75 x 15 mm Quantum at 24 atmospheres.  There,  again, was sluggish flow in the septal accompanied by chest discomfort.  I gave 200 mcg of intracoronary coronary nitroglycerin.  I then advanced  a Whisper wire into the septal perforator.  With simply passing the  wire, TIMI III flow was achieved and the patient's pain resolved.  With  good flow in the small vessel and excellent result in the LAD, I elected  not to proceed with any further  angioplasty of the septal perforator.  The catheters were then removed over a wire.  Angiography demonstrated  no residual stenosis in either lesion, TIMI III flow in the diagonal and  TIMI II-III flow in the septal perforator.  The patient was then  transferred to the holding room in stable condition having tolerated the  procedure well.  She had no chest pain at the completion of procedure.   COMPLICATIONS:  None.   FINDINGS:  1. LV:  174/13/17.  EF 65% without regional wall motion abnormality.  2. No aortic stenosis or mitral regurgitation.  3. Left main:  Angiographically normal.  4. LAD:  Moderate sized vessel giving rise to a large septal      perforator and a small diagonal.  There was a 99% stenosis crossing      both the septal and the diagonal and then an 80% stenosis of the      mid vessel further downstream.  Both of these LAD lesion were      stented using drug eluting stents reducing the stenosis to      approximately 10%.  5. Circumflex:  Moderate sized vessel giving rise to two marginals.      The AV groove circumflex has a 40% stenosis after the origin of the      first marginal.  The first marginal has a 50% ostial stenosis.  6. RCA:  Quite large dominant vessel.  There is a 30% stenosis      proximally.   IMPRESSION/PLAN:  Successful percutaneous revascularization of two  lesions within the LAD using drug-eluting stents.  The moderate size  septal perforator was jailed and had TIMI II-III flow at completion.  She will be maintained on both aspirin and Plavix indefinitely.  The  dose of ACE inhibitor will be increased.      Salvadore Farber, MD  Electronically Signed     WED/MEDQ  D:  09/29/2006  T:  09/29/2006  Job:  [id number]

## 2011-02-12 NOTE — Discharge Summary (Signed)
[name], [name]      ACCOUNT NO.:  [id number]   MEDICAL RECORD NO.:  [id number]          PATIENT TYPE:  INP   LOCATION:  3742                         FACILITY:  [facility]   PHYSICIAN:  Bettey Mare. Lawrence, NPDATE OF BIRTH:  1945-12-12   DATE OF ADMISSION:  09/18/2006  DATE OF DISCHARGE:  09/21/2006                               DISCHARGE SUMMARY   ADDENDUM TO DISCHARGE SUMMARY:  Patient's lab work results were  returned.  Prior to discharge, the patient's potassium was found to be  4.4, patient's sodium 139, chloride 100, CO2 33 with a glucose of 71.  The patient's BUN was 16 with a creatinine of 0.9.  The patient was  advised that she could be discharged with followup labs on Friday,  December 28, for review of potassium level and consideration for  reinstitution of ACE inhibitor at Dr. Melinda Crutch discretion on his  followup appointment with her already scheduled for January 15.      Bettey Mare. Lyman Bishop, NP     KML/MEDQ  D:  09/21/2006  T:  09/22/2006  Job:  [id number]

## 2011-06-23 LAB — PROTIME-INR
INR: 0.9
Prothrombin Time: 12.3

## 2011-06-23 LAB — BASIC METABOLIC PANEL
Calcium: 9.7
Creatinine, Ser: 1.1
GFR calc Af Amer: >60
GFR calc non Af Amer: 50 — ABNORMAL LOW
Sodium: 138

## 2011-06-23 LAB — CBC
Hemoglobin: 14.2
MCHC: 34.5
RBC: 4.68

## 2011-07-09 LAB — BASIC METABOLIC PANEL
CO2: 22
Calcium: 9.5
Glucose, Bld: 166 — ABNORMAL HIGH
Sodium: 138

## 2011-07-09 LAB — CBC
Hemoglobin: 12.8
MCHC: 34
RDW: 18.5 — ABNORMAL HIGH

## 2011-07-09 LAB — PROTIME-INR: INR: 1

## 2012-03-27 HISTORY — PX: INSERT / REPLACE / REMOVE PACEMAKER: SUR710

## 2012-05-28 DIAGNOSIS — H269 Unspecified cataract: Secondary | ICD-10-CM

## 2012-05-28 HISTORY — DX: Unspecified cataract: H26.9

## 2013-05-28 HISTORY — PX: EYE SURGERY: SHX253

## 2013-05-28 HISTORY — PX: CATARACT EXTRACTION W/ INTRAOCULAR LENS IMPLANT: SHX1309

## 2013-08-07 ENCOUNTER — Encounter (INDEPENDENT_AMBULATORY_CARE_PROVIDER_SITE_OTHER): Payer: Self-pay

## 2013-08-07 ENCOUNTER — Encounter: Payer: Self-pay | Admitting: *Deleted

## 2013-08-07 ENCOUNTER — Ambulatory Visit (INDEPENDENT_AMBULATORY_CARE_PROVIDER_SITE_OTHER): Payer: Commercial Managed Care - HMO | Admitting: Cardiovascular Disease

## 2013-08-07 ENCOUNTER — Ambulatory Visit: Payer: Self-pay | Admitting: Cardiovascular Disease

## 2013-08-07 ENCOUNTER — Encounter: Payer: Self-pay | Admitting: Cardiovascular Disease

## 2013-08-07 VITALS — BP 133/83 | HR 71 | Ht 62.0 in | Wt 163.5 lb

## 2013-08-07 DIAGNOSIS — R0789 Other chest pain: Secondary | ICD-10-CM

## 2013-08-07 DIAGNOSIS — R001 Bradycardia, unspecified: Secondary | ICD-10-CM | POA: Insufficient documentation

## 2013-08-07 DIAGNOSIS — I498 Other specified cardiac arrhythmias: Secondary | ICD-10-CM

## 2013-08-07 DIAGNOSIS — I2510 Atherosclerotic heart disease of native coronary artery without angina pectoris: Secondary | ICD-10-CM

## 2013-08-07 MED ORDER — ISOSORBIDE DINITRATE 30 MG PO TABS: 60.0000 mg | ORAL_TABLET | Freq: Every day | ORAL | Status: DC

## 2013-08-07 NOTE — Patient Instructions (Addendum)
  YOU WILL NEED TO BE SCHEDULED FOR PACE MAKER CHECK WITH DR. Graciela Husbands  INCREASE IMDUR TO 60 MG DAILY  FOLLOW UP WITH DR. Elease Hashimoto IN 3 MONTHS    [facility] MYOVIEW  Your caregiver has ordered a Stress Test with nuclear imaging. The purpose of this test is to evaluate the blood supply to your heart muscle. This procedure is referred to as a "Non-Invasive Stress Test." This is because other than having an IV started in your vein, nothing is inserted or "invades" your body. Cardiac stress tests are done to find areas of poor blood flow to the heart by determining the extent of coronary artery disease (CAD). Some patients exercise on a treadmill, which naturally increases the blood flow to your heart, while others who are  unable to walk on a treadmill due to physical limitations have a pharmacologic/chemical stress agent called Lexiscan . This medicine will mimic walking on a treadmill by temporarily increasing your coronary blood flow.   Please note: these test may take anywhere between 2-4 hours to complete  PLEASE REPORT TO [facility] MEDICAL MALL ENTRANCE  THE VOLUNTEERS AT THE FIRST DESK WILL DIRECT YOU WHERE TO GO  Date of Procedure:___November 14, 2014______  Arrival Time for Procedure:___arrive at  7:15 am _____  Instructions regarding medication:     _x__:  Hold betablocker(s) night before procedure and morning of procedure  _x__:  Hold other medications as follows:_Please do not take Lasix the am of procedure. Do not take the metoprolol the night before and the am of procedure. ____________________________________________________________________________________________________________________________________________________________________________________________________________________________  PLEASE NOTIFY THE OFFICE AT LEAST 24 HOURS IN ADVANCE IF YOU ARE UNABLE TO KEEP YOUR APPOINTMENT.  [phone] AND  PLEASE NOTIFY NUCLEAR MEDICINE AT [facility] AT LEAST 24 HOURS IN ADVANCE IF YOU ARE UNABLE  TO KEEP YOUR APPOINTMENT. [phone]  How to prepare for your Myoview test:  1. Do not eat or drink after midnight 2. No caffeine for 24 hours prior to test 3. No smoking 24 hours prior to test. 4. Your medication may be taken with water.  If your doctor stopped a medication because of this test, do not take that medication. 5. Ladies, please do not wear dresses.  Skirts or pants are appropriate. Please wear a short sleeve shirt. 6. No perfume, cologne or lotion. 7. Wear comfortable walking shoes. No heels!

## 2013-08-07 NOTE — Assessment & Plan Note (Signed)
She had a Environmental manager pacer placed while she was in [place].

## 2013-08-07 NOTE — Assessment & Plan Note (Signed)
[name] presents with some exertional CP .  Worrisome for angina.  Will increase Imdur to 60 mg a day.    Will schedule her for a Scientist, physiological at [facility].    I will see her in 3 months.

## 2013-08-07 NOTE — Progress Notes (Signed)
[place] Blas Date of Birth  [birth year]       [facility]    [place] [address]  [address] [place], [place]  [address]   [place], [place]  [address] [phone]     [phone]   Fax  [phone]    Fax [phone]  Problem List: 1. CAD, CABG ( 2007) 2. Hypothyroidism 3. CVA 4. Pacer 5. COPD  History of Present Illness:  [name] is a 67 yo who used to see Dr. Geralynn Rile.  She was living in [place]  ( [place] / [place]) and has recently returned to [facility].    She quit smoking 7 years ago.    She has had some episodes of CP recently.   The pains are a chest pressure / weight / squeezing sensation.   + associated with dyspnea, no diaphoresis.    These episodes last about 1 seconds to 1-2 minutes.  These occur with exertion or with stress.  She has just started getting some exercise.    The pains are eased with SL NTG - usually after 1 NTG, occasionally takes 2 NTG She has occasional hear palpitations.   Current Outpatient Prescriptions  Medication Sig Dispense Refill  . albuterol (PROVENTIL HFA;VENTOLIN HFA) 108 (90 BASE) MCG/ACT inhaler Inhale 2 puffs into the lungs every 6 (six) hours as needed for wheezing or shortness of breath.      . ALPRAZolam (XANAX) 1 MG tablet Take 2 mg by mouth daily.      Marland Kitchen aspirin 81 MG tablet Take 81 mg by mouth daily.      Marland Kitchen atorvastatin (LIPITOR) 20 MG tablet Take 20 mg by mouth daily.      . clopidogrel (PLAVIX) 75 MG tablet Take 75 mg by mouth daily with breakfast.      . docusate sodium (COLACE) 100 MG capsule Take 100 mg by mouth 3 (three) times daily.      . furosemide (LASIX) 20 MG tablet Take 20 mg by mouth daily.      . isosorbide dinitrate (ISORDIL) 30 MG tablet Take 30 mg by mouth daily.      Marland Kitchen levothyroxine (SYNTHROID, LEVOTHROID) 25 MCG tablet Take 25 mcg by mouth daily before breakfast.      . metoprolol tartrate (LOPRESSOR) 25 MG tablet Take 25 mg by mouth 2 (two) times daily.       . nitroGLYCERIN (NITROSTAT) 0.4 MG SL tablet Place 0.4 mg under the tongue every 5 (five) minutes as needed for chest pain.      . pramipexole (MIRAPEX) 1 MG tablet Take 1.5 mg by mouth 3 (three) times daily.      . ranitidine (ZANTAC) 300 MG tablet Take 300 mg by mouth at bedtime.      . sertraline (ZOLOFT) 100 MG tablet Take 200 mg by mouth daily.      . theophylline (THEODUR) 200 MG 12 hr tablet Take 200 mg by mouth 2 (two) times daily.      . traZODone (DESYREL) 100 MG tablet Take 200 mg by mouth at bedtime.       No current facility-administered medications for this visit.     Allergies  Allergen Reactions  . Codeine     REACTION: Not sure of reaction  . Iohexol      Desc: PT HAD CONTRAST REACTION YRS AGO NEEDS 13 HR PREP FOR ALL CONTRAST STUDIES     Past Medical History  Diagnosis Date  . Hypothyroid   . Stroke   . Syncope and collapse   . MI (myocardial infarction)     x2  . COPD (chronic obstructive pulmonary disease)   . Depression   . Insomnia   . Restless leg syndrome   . Coronary artery disease   . Bulging disc   . Chronic kidney disease, stage III (moderate)   . Macular degeneration     Past Surgical History  Procedure Laterality Date  . Coronary artery bypass graft      x 4  . Cardiac catheterization  2007    x2;[place]  . Cardiac catheterization  2008  . Cerebral angiogram    . Insert / replace / [place] pacemaker  2013    [place]  . Lumbar disc surgery    . Gallbladder surgery    . Partial hysterectomy    . Knee arthroscopy    . Cataract extraction Right     History  Smoking status  . Never Smoker   Smokeless tobacco  . Not on file    History  Alcohol Use No    Family History  Problem Relation Age of Onset  . Stomach cancer Mother   . Hypertension Father   . Lung cancer Father     Reviw of Systems:  Reviewed in the HPI.  All other systems are negative.  Physical Exam: Blood pressure 133/83, pulse 71, height 5\' 2"  (1.575 m),  weight 163 lb 8 oz (74.163 kg). General: Well developed, well nourished, in no acute distress.  Head: Normocephalic, atraumatic, sclera non-icteric, mucus membranes are moist,   Neck: Supple. Carotids are 2 + without bruits. No JVD   Lungs: Clear   Heart: RR, normal S1, S2  Abdomen: Soft, non-tender, non-distended with normal bowel sounds.  Msk:  Strength and tone are normal   Extremities: No clubbing or cyanosis. No edema.  Distal pedal pulses are 2+ and equal    Neuro: CN II - XII intact.  Alert and oriented X 3.   Psych:  Normal   ECG: Nov. 11, 2014:  Atrial pacing at 71, low voltage.   Assessment / Plan:

## 2013-08-08 ENCOUNTER — Telehealth: Payer: Self-pay

## 2013-08-08 NOTE — Telephone Encounter (Signed)
Spoke w/ pt.  Answered pt's questions regarding lexiscan sched for 08/10/13 @ 7:30, such as where test is taking place, what is involved in it, what medications to hold, etc. Pt verbalizes understanding and will call back if she any further questions.

## 2013-08-10 ENCOUNTER — Ambulatory Visit: Payer: Self-pay

## 2013-08-10 ENCOUNTER — Other Ambulatory Visit: Payer: Self-pay

## 2013-08-10 DIAGNOSIS — R079 Chest pain, unspecified: Secondary | ICD-10-CM

## 2013-08-10 DIAGNOSIS — R0789 Other chest pain: Secondary | ICD-10-CM

## 2013-08-14 ENCOUNTER — Telehealth: Payer: Self-pay

## 2013-08-14 NOTE — Telephone Encounter (Signed)
Message copied by Marilynne Halsted on Tue Aug 14, 2013 10:05 AM ------      Message from: Antony Odea      Created: Mon Aug 13, 2013 10:54 AM                   ----- Message -----         From: Vesta Mixer, MD         Sent: 08/13/2013   8:33 AM           To: Antony Odea, RN            Normal myoview       ------

## 2013-08-14 NOTE — Telephone Encounter (Signed)
Spoke w/ pt.  She is aware of results.  

## 2013-08-15 ENCOUNTER — Telehealth: Payer: Self-pay

## 2013-08-15 NOTE — Telephone Encounter (Signed)
New Rx for Isosorbide Dinitrate 30 mg but patient has been taking Isosorbide mono ER 30 mg. Please clarify which is the correct therapy Isosorbide Dinitrate 30 mg or Isosorbide mon ER 30 mg. Also looks like the patient was to increase the dose to 60 mg at last office vist. Please advise as soon as possible pharmacy is questioning the doses.

## 2013-08-15 NOTE — Telephone Encounter (Signed)
New Rx for Isosorbide Dinitrate 30 mg but patient has been taking Isosorbide mono ER 30 mg. Please clarify which is the correct therapy Isosorbide Dinitrate 30 mg or Isosorbide mon ER 30 mg. Also looks like the patient was to increase the dose to 60 mg at last office visit. Please advise as soon as possible pharmacy is questioning the doses.

## 2013-08-17 ENCOUNTER — Other Ambulatory Visit: Payer: Self-pay

## 2013-08-17 MED ORDER — ISOSORBIDE MONONITRATE ER 60 MG PO TB24: 60.0000 mg | ORAL_TABLET | Freq: Every day | ORAL | Status: DC

## 2013-08-17 NOTE — Telephone Encounter (Signed)
Refill sent for isosorbide mono 60 mg take one tablet daily. 

## 2013-08-17 NOTE — Telephone Encounter (Signed)
I always give Isosorbide moninitrate.  Please change script

## 2013-08-17 NOTE — Telephone Encounter (Signed)
Rx sent for Isosorbide mono 60 mg take one tablet daily.

## 2013-08-21 ENCOUNTER — Ambulatory Visit (INDEPENDENT_AMBULATORY_CARE_PROVIDER_SITE_OTHER): Payer: Commercial Managed Care - HMO | Admitting: Internal Medicine

## 2013-08-21 ENCOUNTER — Encounter: Payer: Self-pay | Admitting: Internal Medicine

## 2013-08-21 VITALS — BP 102/69 | HR 72 | Ht 63.0 in | Wt 168.0 lb

## 2013-08-21 DIAGNOSIS — I471 Supraventricular tachycardia: Secondary | ICD-10-CM

## 2013-08-21 DIAGNOSIS — I498 Other specified cardiac arrhythmias: Secondary | ICD-10-CM

## 2013-08-21 DIAGNOSIS — Z45018 Encounter for adjustment and management of other part of cardiac pacemaker: Secondary | ICD-10-CM

## 2013-08-21 DIAGNOSIS — I10 Essential (primary) hypertension: Secondary | ICD-10-CM

## 2013-08-21 DIAGNOSIS — I2510 Atherosclerotic heart disease of native coronary artery without angina pectoris: Secondary | ICD-10-CM

## 2013-08-21 DIAGNOSIS — I495 Sick sinus syndrome: Secondary | ICD-10-CM

## 2013-08-21 LAB — MDC_IDC_ENUM_SESS_TYPE_INCLINIC
Battery Remaining Longevity: 89 mo
Battery Remaining Percentage: 100 %
Brady Statistic RA Percent Paced: 97 %
Brady Statistic RV Percent Paced: 1 %
Implantable Pulse Generator Serial Number: 131914
Lead Channel Impedance Value: 599 Ohm
Lead Channel Pacing Threshold Amplitude: 0.8 V
Lead Channel Pacing Threshold Pulse Width: 0.4 ms
Lead Channel Pacing Threshold Pulse Width: 0.4 ms
Lead Channel Sensing Intrinsic Amplitude: 2.1 mV
Lead Channel Setting Pacing Amplitude: 2 V
Lead Channel Setting Pacing Amplitude: 2 V
Lead Channel Setting Pacing Pulse Width: 0.4 ms

## 2013-08-21 NOTE — Patient Instructions (Signed)
Your physician wants you to follow-up in: 9 months with Dr. Graciela Husbands. You will receive a reminder letter in the mail two months in advance. If you don't receive a letter, please call our office to schedule the follow-up appointment.  Your physician recommends that you continue on your current medications as directed. Please refer to the Current Medication list given to you today.

## 2013-08-21 NOTE — Progress Notes (Signed)
Patient Care Team: Wonda Cheng as PCP - General (Family Medicine) Wonda Cheng (Family Medicine)   HPI  [name] is a 67 y.o. female Seen to establish pacemaker followup.  This was implanted for syncope and sinus node dysfunction about 18 months ago; she has had no recurrent syncope.  Exercise tolerance is reasonable.  She has a history of chest pains and was recently submitted for Myoview scanning; this was negative.  hAS history of ischemic heart disease with prior bypass surgery and prior stroke.  Past Medical History  Diagnosis Date  . Hypothyroid   . Stroke   . Syncope and collapse   . MI (myocardial infarction)     x2  . COPD (chronic obstructive pulmonary disease)   . Depression   . Insomnia   . Restless leg syndrome   . Coronary artery disease   . Bulging disc   . Chronic kidney disease, stage III (moderate)   . Macular degeneration   . Arteriosclerosis of coronary artery bypass graft   . Tachycardia   . Pacemaker     [employer]  . Esophageal reflux   . Unspecified hearing loss   . Mixed hyperlipidemia   . PVT (paroxysmal ventricular tachycardia)     Past Surgical History  Procedure Laterality Date  . Coronary artery bypass graft      x 4  . Cardiac catheterization  2007    x2;cone  . Cardiac catheterization  2008  . Cerebral angiogram    . Lumbar disc surgery    . Gallbladder surgery    . Partial hysterectomy    . Knee arthroscopy    . Cataract extraction Right   . Insert / replace / remove pacemaker  2013    [place], [place]; [employer]  . Colonoscopy      Current Outpatient Prescriptions  Medication Sig Dispense Refill  . albuterol (PROVENTIL HFA;VENTOLIN HFA) 108 (90 BASE) MCG/ACT inhaler Inhale 2 puffs into the lungs every 6 (six) hours as needed for wheezing or shortness of breath.      . ALPRAZolam (XANAX) 1 MG tablet Take 2 mg by mouth daily.      Marland Kitchen aspirin 81 MG tablet Take 81 mg by mouth daily.        Marland Kitchen atorvastatin (LIPITOR) 20 MG tablet Take 20 mg by mouth daily.      . clopidogrel (PLAVIX) 75 MG tablet Take 75 mg by mouth daily with breakfast.      . docusate sodium (COLACE) 100 MG capsule Take 100 mg by mouth 3 (three) times daily.      . furosemide (LASIX) 20 MG tablet Take 20 mg by mouth daily.      . isosorbide mononitrate (IMDUR) 60 MG 24 hr tablet Take 1 tablet (60 mg total) by mouth daily.  90 tablet  3  . levothyroxine (SYNTHROID, LEVOTHROID) 25 MCG tablet Take 25 mcg by mouth daily before breakfast.      . metoprolol tartrate (LOPRESSOR) 25 MG tablet Take 25 mg by mouth 2 (two) times daily.      . nitroGLYCERIN (NITROSTAT) 0.4 MG SL tablet Place 0.4 mg under the tongue every 5 (five) minutes as needed for chest pain.      . pramipexole (MIRAPEX) 1 MG tablet Take 1.5 mg by mouth 3 (three) times daily.      . ranitidine (ZANTAC) 300 MG tablet Take 300 mg by mouth at bedtime.      Marland Kitchen  sertraline (ZOLOFT) 100 MG tablet Take 200 mg by mouth daily.      . theophylline (THEODUR) 200 MG 12 hr tablet Take 200 mg by mouth 2 (two) times daily.      . traZODone (DESYREL) 100 MG tablet Take 200 mg by mouth at bedtime.       No current facility-administered medications for this visit.    Allergies  Allergen Reactions  . Codeine     REACTION: Not sure of reaction  . Iohexol      Desc: PT HAD CONTRAST REACTION YRS AGO NEEDS 13 HR PREP FOR ALL CONTRAST STUDIES     Review of Systems negative except from HPI and PMH  Physical Exam BP 102/69  Pulse 72  Ht 5\' 3"  (1.6 m)  Wt 168 lb (76.204 kg)  BMI 29.77 kg/m2 Well developed and well nourished in no acute distress HENT normal E scleral and icterus clear Neck Supple JVP flat; carotids brisk and full Clear to ausculation Device pocket well healed; without hematoma or erythema.  There is no tethering Regular rate and rhythm, no murmurs gallops or rub Soft with active bowel sounds No clubbing cyanosis none Edema Alert and oriented,  grossly normal motor and sensory function Skin Warm and Dry  ECG demonstrates atrial pacing at 72 Intervals 18/09/40 and axis XC Incomplete right bundle branch block and possible inferior wall MI  Assessment and  Plan

## 2013-08-21 NOTE — Assessment & Plan Note (Signed)
The patient's device was interrogated.  The information was reviewed. No changes were made in the programming.    

## 2013-08-21 NOTE — Assessment & Plan Note (Signed)
The patient has nonsustained atrial arrhythmias documented in her device. The durations are exceedingly brief lasting only seconds. Given her history of TIA we will need to keep a close eye on this so as to use anticoagulation if atrial fibrillation is identified.

## 2013-08-21 NOTE — Assessment & Plan Note (Signed)
The patient has 100% atrial pacing.

## 2013-08-27 ENCOUNTER — Telehealth: Payer: Self-pay | Admitting: *Deleted

## 2013-08-27 NOTE — Telephone Encounter (Signed)
Instructions given to patient for remote follow up.

## 2013-08-27 NOTE — Telephone Encounter (Signed)
Please contact the patient regarding her at home pacer monitor. She is trying to set up the monitor so she can have pacemaker checked at home.

## 2013-08-27 NOTE — Telephone Encounter (Signed)
Patient is having problems with  her heart monitor that Dr. Graciela Husbands ordered. Please advise

## 2013-09-06 ENCOUNTER — Other Ambulatory Visit: Payer: Self-pay | Admitting: *Deleted

## 2013-09-06 ENCOUNTER — Telehealth: Payer: Self-pay | Admitting: Cardiovascular Disease

## 2013-09-06 MED ORDER — CLOPIDOGREL BISULFATE 75 MG PO TABS: 75.0000 mg | ORAL_TABLET | Freq: Every day | ORAL | Status: DC

## 2013-09-06 NOTE — Telephone Encounter (Signed)
Requested Prescriptions   Signed Prescriptions Disp Refills  . clopidogrel (PLAVIX) 75 MG tablet 90 tablet 3    Sig: Take 1 tablet (75 mg total) by mouth daily with breakfast.    Authorizing Provider: NAHSER, PHILIP J    Ordering User: Ervin Rothbauer C    

## 2013-09-06 NOTE — Telephone Encounter (Signed)
New Message  Pt called states that the monitor is still not working// Pt states she has spoken with Gunnar Fusi in device who (per patient) said she would handle it. Please call back to discuss.  [employer]itude for pacer/ The Monitor on shows two yellow lines//SR

## 2013-09-06 NOTE — Telephone Encounter (Signed)
Requested Prescriptions   Signed Prescriptions Disp Refills  . clopidogrel (PLAVIX) 75 MG tablet 90 tablet 3    Sig: Take 1 tablet (75 mg total) by mouth daily with breakfast.    Authorizing Provider: Vesta Mixer    Ordering User: Kendrick Fries

## 2013-09-06 NOTE — Telephone Encounter (Signed)
Patient to call [employer] [phone] for help trouble shooting.

## 2013-09-07 ENCOUNTER — Telehealth: Payer: Self-pay | Admitting: Cardiovascular Disease

## 2013-09-07 NOTE — Telephone Encounter (Signed)
Called stating when she called company about her pacemaker she was told that she needed software update.  Spoke w/ Weston Anna (device) and he will call pt this afternoon.  Will route to [employer].

## 2013-09-07 NOTE — Telephone Encounter (Signed)
Pt updated me w/ device model & serial #s for home transmitter. I updated [employer] website. Pt has [employer] cable/phone but does not have a DSL filter. Pt's phone is plugged into router. I will mail her a DSL filter.

## 2013-09-07 NOTE — Telephone Encounter (Signed)
Follow Up   Pt called the the support number that was given/// found out that the monitor needs new software// please call to assist further

## 2013-10-19 ENCOUNTER — Encounter: Payer: Self-pay | Admitting: Internal Medicine

## 2013-10-23 ENCOUNTER — Telehealth: Payer: Self-pay | Admitting: Internal Medicine

## 2013-10-23 NOTE — Telephone Encounter (Signed)
Transmission received, left message for patient.

## 2013-10-23 NOTE — Telephone Encounter (Signed)
New Prob    Pt states she needs some clarification with transmitting. Please call.

## 2013-10-31 ENCOUNTER — Encounter: Payer: Self-pay | Admitting: Cardiovascular Disease

## 2013-10-31 ENCOUNTER — Ambulatory Visit (INDEPENDENT_AMBULATORY_CARE_PROVIDER_SITE_OTHER): Payer: Commercial Managed Care - HMO | Admitting: Cardiovascular Disease

## 2013-10-31 VITALS — BP 90/56 | HR 70 | Ht 62.0 in | Wt 181.0 lb

## 2013-10-31 DIAGNOSIS — I498 Other specified cardiac arrhythmias: Secondary | ICD-10-CM

## 2013-10-31 DIAGNOSIS — I471 Supraventricular tachycardia: Secondary | ICD-10-CM

## 2013-10-31 DIAGNOSIS — E785 Hyperlipidemia, unspecified: Secondary | ICD-10-CM

## 2013-10-31 MED ORDER — ISOSORBIDE MONONITRATE ER 30 MG PO TB24: 30.0000 mg | ORAL_TABLET | Freq: Every day | ORAL | Status: DC

## 2013-10-31 NOTE — Patient Instructions (Signed)
Your physician wants you to follow-up in: 6 months. You will receive a reminder letter in the mail two months in advance. If you don't receive a letter, please call our office to schedule the follow-up appointment.  Your physician recommends that you return for lab work in:  6 months   Your physician has recommended you make the following change in your medication:  Decrease Imdur 30 mg daily

## 2013-10-31 NOTE — Assessment & Plan Note (Signed)
[name] is doing well. Her Myoview revealed no evidence of ischemia. Her left ventricular systolic function is normal.  She's not having any further episodes of chest pain. Because her blood pressure is low, we will decrease her isosorbide to 30 mg a day.  I've advised her to exercise on a daily basis.  I'll see her in 6 months. We'll check fasting lipids, liver enzymes, and basic metabolic profile at that time.

## 2013-10-31 NOTE — Progress Notes (Signed)
[name] Date of Birth  Sep 21, 1946       [facility]    [employer] [address], [address], [place], [place]  [address]   [place], [place]  [address] [phone]     [phone]   Fax  [phone]    Fax [phone]  Problem List: 1. CAD, CABG ( 2007) 2. Hypothyroidism 3. CVA 4. Pacer 5. COPD  History of Present Illness:  08/07/13: [name] is a 68 yo who used to see Dr. Rushie Chestnut.  She was living in [place]  ( [place] / [place]) and has recently returned to [facility].    She quit smoking 7 years ago.    She has had some episodes of CP recently.   The pains are a chest pressure / weight / squeezing sensation.   + associated with dyspnea, no diaphoresis.    These episodes last about 1 seconds to 1-2 minutes.  These occur with exertion or with stress.  She has just started getting some exercise.    The pains are eased with SL NTG - usually after 1 NTG, occasionally takes 2 NTG She has occasional hear palpitations.  Feb. 4, 2015:  [name] had a Myoview study following her office visit in November. The Myoview revealed no evidence of ischemia. Her left ventricular systolic function is normal with an EF of 72%.  She has been doing well.   She is not exercising.    She has had some weakness.  She noticed that her BP was low yesterday.    Current Outpatient Prescriptions  Medication Sig Dispense Refill  . albuterol (PROVENTIL HFA;VENTOLIN HFA) 108 (90 BASE) MCG/ACT inhaler Inhale 2 puffs into the lungs every 6 (six) hours as needed for wheezing or shortness of breath.      . ALPRAZolam (XANAX) 1 MG tablet Take 1 mg by mouth daily.       Marland Kitchen aspirin 81 MG tablet Take 81 mg by mouth daily.      Marland Kitchen atorvastatin (LIPITOR) 20 MG tablet Take 20 mg by mouth daily.      . clopidogrel (PLAVIX) 75 MG tablet Take 1 tablet (75 mg total) by mouth daily with breakfast.  90 tablet  3  . diphenhydrAMINE (SOMINEX) 25 MG tablet Take 25 mg by  mouth at bedtime as needed for sleep.      Marland Kitchen docusate sodium (COLACE) 100 MG capsule Take 100 mg by mouth daily.       . famotidine (PEPCID) 20 MG tablet Take 20 mg by mouth as needed for heartburn or indigestion.      . furosemide (LASIX) 20 MG tablet Take 20 mg by mouth daily.      . isosorbide mononitrate (IMDUR) 60 MG 24 hr tablet Take 1 tablet (60 mg total) by mouth daily.  90 tablet  3  . levothyroxine (SYNTHROID, LEVOTHROID) 25 MCG tablet Take 25 mcg by mouth daily before breakfast.      . metoprolol tartrate (LOPRESSOR) 25 MG tablet Take 25 mg by mouth 2 (two) times daily.      . nitroGLYCERIN (NITROSTAT) 0.4 MG SL tablet Place 0.4 mg under the tongue every 5 (five) minutes as needed for chest pain.      . pramipexole (MIRAPEX) 1 MG tablet Take 1.5 mg by mouth daily.       . ranitidine (ZANTAC) 300 MG tablet Take 300 mg by mouth at bedtime.      Marland Kitchen  sertraline (ZOLOFT) 100 MG tablet Take 200 mg by mouth daily.      . theophylline (THEODUR) 200 MG 12 hr tablet Take 200 mg by mouth 2 (two) times daily.      . traZODone (DESYREL) 100 MG tablet Take 200 mg by mouth at bedtime.       No current facility-administered medications for this visit.     Allergies  Allergen Reactions  . Codeine     REACTION: Not sure of reaction  . Iohexol      Desc: PT HAD CONTRAST REACTION YRS AGO NEEDS 13 HR PREP FOR ALL CONTRAST STUDIES     Past Medical History  Diagnosis Date  . Hypothyroid   . Stroke   . Syncope and collapse   . MI (myocardial infarction)     x2  . COPD (chronic obstructive pulmonary disease)   . Depression   . Insomnia   . Restless leg syndrome   . Coronary artery disease   . Bulging disc   . Chronic kidney disease, stage III (moderate)   . Macular degeneration   . Arteriosclerosis of coronary artery bypass graft   . Tachycardia   . Pacemaker     [employer]  . Esophageal reflux   . Unspecified hearing loss   . Mixed hyperlipidemia   . PVT (paroxysmal  ventricular tachycardia)     Past Surgical History  Procedure Laterality Date  . Coronary artery bypass graft      x 4  . Cardiac catheterization  2007    x2;cone  . Cardiac catheterization  2008  . Cerebral angiogram    . Lumbar disc surgery    . Gallbladder surgery    . Partial hysterectomy    . Knee arthroscopy    . Cataract extraction Right   . Insert / replace / remove pacemaker  2013    [place], [place]; [employer]  . Colonoscopy      History  Smoking status  . Never Smoker   Smokeless tobacco  . Not on file    History  Alcohol Use No    Family History  Problem Relation Age of Onset  . Stomach cancer Mother   . Hypertension Father   . Lung cancer Father     Reviw of Systems:  Reviewed in the HPI.  All other systems are negative.  Physical Exam: Blood pressure 90/56, pulse 70, height 5\' 2"  (1.575 m), weight 181 lb (82.101 kg). General: Well developed, well nourished, in no acute distress. Head: Normocephalic, atraumatic, sclera non-icteric, mucus membranes are moist,  Neck: Supple. Carotids are 2 + without bruits. No JVD  Lungs: Clear  Heart: RR, normal S1, S2, pacer in left subclavian area, nontender Abdomen: Soft, non-tender, non-distended with normal bowel sounds. Msk:  Strength and tone are normal  Extremities: No clubbing or cyanosis. No edema.  Distal pedal pulses are 2+ and equal Neuro: CN II - XII intact.  Alert and oriented X 3.  Psych:  Normal   ECG: Feb. 4, 2015: Normal sinus rhythm at 70. She is possible right ventricular hypertrophy. There are no changes.  Assessment / Plan:

## 2013-11-22 ENCOUNTER — Ambulatory Visit (INDEPENDENT_AMBULATORY_CARE_PROVIDER_SITE_OTHER): Payer: Medicare HMO | Admitting: *Deleted

## 2013-11-22 DIAGNOSIS — I498 Other specified cardiac arrhythmias: Secondary | ICD-10-CM

## 2013-11-22 DIAGNOSIS — I471 Supraventricular tachycardia: Secondary | ICD-10-CM

## 2013-11-22 DIAGNOSIS — I495 Sick sinus syndrome: Secondary | ICD-10-CM

## 2013-11-22 LAB — MDC_IDC_ENUM_SESS_TYPE_REMOTE
Brady Statistic RA Percent Paced: 98 %
Brady Statistic RV Percent Paced: 4 %
Implantable Pulse Generator Serial Number: 131914
Lead Channel Impedance Value: 532 Ohm
Lead Channel Sensing Intrinsic Amplitude: 7.9 mV
Lead Channel Setting Pacing Amplitude: 2 V
Lead Channel Setting Pacing Amplitude: 2 V
Lead Channel Setting Pacing Pulse Width: 0.4 ms
Lead Channel Setting Sensing Sensitivity: 2.5 mV
MDC IDC MSMT LEADCHNL RA IMPEDANCE VALUE: 560 Ohm
MDC IDC SESS DTM: 20150226142400

## 2013-11-26 ENCOUNTER — Encounter: Payer: Self-pay | Admitting: Internal Medicine

## 2013-12-17 ENCOUNTER — Encounter: Payer: Self-pay | Admitting: *Deleted

## 2013-12-25 ENCOUNTER — Encounter: Payer: Self-pay | Admitting: Internal Medicine

## 2014-01-24 ENCOUNTER — Emergency Department: Payer: Self-pay | Admitting: Internal Medicine

## 2014-01-24 LAB — COMPREHENSIVE METABOLIC PANEL
ALK PHOS: 115 U/L
ALT: 25 U/L (ref 12–78)
ANION GAP: 7 (ref 7–16)
Albumin: 3.8 g/dL (ref 3.4–5.0)
BUN: 22 mg/dL — AB (ref 7–18)
Bilirubin,Total: 0.3 mg/dL (ref 0.2–1.0)
CALCIUM: 9.5 mg/dL (ref 8.5–10.1)
CO2: 29 mmol/L (ref 21–32)
Chloride: 105 mmol/L (ref 98–107)
Creatinine: 1.51 mg/dL — ABNORMAL HIGH (ref 0.60–1.30)
GFR CALC AF AMER: 41 — AB
GFR CALC NON AF AMER: 35 — AB
GLUCOSE: 86 mg/dL (ref 65–99)
OSMOLALITY: 284 (ref 275–301)
POTASSIUM: 4 mmol/L (ref 3.5–5.1)
SGOT(AST): 19 U/L (ref 15–37)
SODIUM: 141 mmol/L (ref 136–145)
Total Protein: 8 g/dL (ref 6.4–8.2)

## 2014-01-24 LAB — CBC WITH DIFFERENTIAL/PLATELET
BASOS ABS: 0.1 10*3/uL (ref 0.0–0.1)
Basophil %: 0.6 %
EOS ABS: 0.2 10*3/uL (ref 0.0–0.7)
EOS PCT: 2.2 %
HCT: 42 % (ref 35.0–47.0)
HGB: 13.9 g/dL (ref 12.0–16.0)
LYMPHS ABS: 2.4 10*3/uL (ref 1.0–3.6)
LYMPHS PCT: 27.9 %
MCH: 29.6 pg (ref 26.0–34.0)
MCHC: 33.1 g/dL (ref 32.0–36.0)
MCV: 90 fL (ref 80–100)
MONO ABS: 1.3 x10 3/mm — AB (ref 0.2–0.9)
Monocyte %: 14.9 %
NEUTROS ABS: 4.6 10*3/uL (ref 1.4–6.5)
NEUTROS PCT: 54.4 %
Platelet: 188 10*3/uL (ref 150–440)
RBC: 4.68 10*6/uL (ref 3.80–5.20)
RDW: 13.3 % (ref 11.5–14.5)
WBC: 8.5 10*3/uL (ref 3.6–11.0)

## 2014-01-24 LAB — URINALYSIS, COMPLETE
Bacteria: NONE SEEN
Bilirubin,UR: NEGATIVE
GLUCOSE, UR: NEGATIVE mg/dL (ref 0–75)
Ketone: NEGATIVE
Nitrite: NEGATIVE
Ph: 6 (ref 4.5–8.0)
Protein: NEGATIVE
Specific Gravity: 1.016 (ref 1.003–1.030)
Squamous Epithelial: 1
WBC UR: 26 /HPF (ref 0–5)

## 2014-01-24 LAB — LIPASE, BLOOD: Lipase: 339 U/L (ref 73–393)

## 2014-01-24 LAB — TROPONIN I: Troponin-I: <0.02 ng/mL

## 2014-02-20 ENCOUNTER — Telehealth: Payer: Self-pay | Admitting: *Deleted

## 2014-02-20 NOTE — Telephone Encounter (Signed)
She may hold her Plavix for 5 days prior to dental procedure. She does not need SBE prophylaxis.

## 2014-02-20 NOTE — Telephone Encounter (Signed)
[facility] needs to know if they can stop Plavix for 5 days prior to to egd and colonoscopy. Not sched yet.

## 2014-02-21 NOTE — Telephone Encounter (Signed)
She may hold her Plavix for 5 days for her GI procedures.

## 2014-02-21 NOTE — Telephone Encounter (Signed)
Dr. Acie Fredrickson  She is not having a dental procedure.  She is having a EGD and colonoscopy  Still hold for 5 days?

## 2014-02-22 NOTE — Telephone Encounter (Signed)
[employer]al architect at [place] Sx that patient is to hold Plavix for 5 days  Amber verbalized understanding   LVM to inform patient of Dr. Lanny Hurst instructions

## 2014-02-22 NOTE — Telephone Encounter (Signed)
Pt returned call  I informed her that per Dr. Acie Fredrickson hold Plavix 5 days before she has a colonoscopy  Patient verbalized understanding

## 2014-04-23 ENCOUNTER — Ambulatory Visit: Payer: Commercial Managed Care - HMO | Admitting: Cardiovascular Disease

## 2014-05-21 ENCOUNTER — Encounter: Payer: Commercial Managed Care - HMO | Admitting: Internal Medicine

## 2014-05-22 ENCOUNTER — Telehealth: Payer: Self-pay | Admitting: *Deleted

## 2014-05-22 NOTE — Telephone Encounter (Signed)
PT STATES SHE WAS TOLD BY [facility] NURSE TO COME IN AND GET HER BP CHECKED TODAY. THEY WERE TALKING ON THE PHONE AND HER ARMS WENT WEAK AND HER HEART WENT OUT OF CONTROL

## 2014-05-22 NOTE — Telephone Encounter (Signed)
  137/57 HR 72. Pt wanted to come in for bp check said she was feeling better. stold pt she could come in for bp check if elevated with SOB we would take her to the ED. Pt said she would wait until Monday for appt and go to the ED if she got any worse

## 2014-05-27 ENCOUNTER — Ambulatory Visit (INDEPENDENT_AMBULATORY_CARE_PROVIDER_SITE_OTHER): Payer: Medicare HMO | Admitting: Cardiovascular Disease

## 2014-05-27 ENCOUNTER — Encounter: Payer: Self-pay | Admitting: Cardiovascular Disease

## 2014-05-27 VITALS — BP 122/60 | HR 83 | Ht 62.0 in | Wt 185.0 lb

## 2014-05-27 DIAGNOSIS — E785 Hyperlipidemia, unspecified: Secondary | ICD-10-CM

## 2014-05-27 DIAGNOSIS — Z87891 Personal history of nicotine dependence: Secondary | ICD-10-CM

## 2014-05-27 DIAGNOSIS — J438 Other emphysema: Secondary | ICD-10-CM

## 2014-05-27 DIAGNOSIS — I2510 Atherosclerotic heart disease of native coronary artery without angina pectoris: Secondary | ICD-10-CM

## 2014-05-27 DIAGNOSIS — I472 Ventricular tachycardia: Secondary | ICD-10-CM

## 2014-05-27 DIAGNOSIS — R002 Palpitations: Secondary | ICD-10-CM

## 2014-05-27 DIAGNOSIS — I4729 Other ventricular tachycardia: Secondary | ICD-10-CM

## 2014-05-27 DIAGNOSIS — N189 Chronic kidney disease, unspecified: Secondary | ICD-10-CM

## 2014-05-27 DIAGNOSIS — I10 Essential (primary) hypertension: Secondary | ICD-10-CM

## 2014-05-27 DIAGNOSIS — R0789 Other chest pain: Secondary | ICD-10-CM

## 2014-05-27 MED ORDER — METOPROLOL TARTRATE 50 MG PO TABS: 50.0000 mg | ORAL_TABLET | Freq: Two times a day (BID) | ORAL | Status: DC

## 2014-05-27 MED ORDER — ISOSORBIDE MONONITRATE ER 30 MG PO TB24: 30.0000 mg | ORAL_TABLET | Freq: Two times a day (BID) | ORAL | Status: DC

## 2014-05-27 NOTE — Progress Notes (Signed)
Patient ID: [name], female   DOB: 1946/07/15, 68 y.o.   MRN: [id number]      SUBJECTIVE: [name] a 68 year old woman who I am meeting for the first time today. She saw Dr. Acie Fredrickson in February. She has a history of coronary artery bypass graft surgery, pacemaker, COPD, essential hypertension, hyperlipidemia, CKD and CVA. She had a normal nuclear myocardial perfusion study on 08/10/2013. Device interrogation on further 20 03/16/2014 demonstrated 2 episodes of nonsustained ventricular tachycardia lasting 7 beats. There were no mode switches.  She has been experiencing chest discomfort in the upper left region of her chest. During my exam she constantly pressed on this area. She denies leg swelling, orthopnea and PND. She quit smoking approximately 8 or 9 years ago. She has a pacemaker and is scheduled to see Dr. Lovena Le in the near future. She also has macular degeneration. This past Friday at approximately 3 PM, she experienced palpitations and chest discomfort which radiated into her neck. She checked her heart rate which was 89 beats per minute and blood pressure was 102/53. She also has difficulty sleeping. She said her chest wall skin is tender and has been that way ever since bypass surgery.  Soc: Husband passed away 14 yrs ago. 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 Quit smoking 9 yrs ago.  Review of Systems: As per "subjective", otherwise negative.  Allergies  Allergen Reactions  . Codeine     swelling  . Iohexol      Desc: PT HAD CONTRAST REACTION YRS AGO NEEDS 13 HR PREP FOR ALL CONTRAST STUDIES     Current Outpatient Prescriptions  Medication Sig Dispense Refill  . albuterol (PROVENTIL HFA;VENTOLIN HFA) 108 (90 BASE) MCG/ACT inhaler Inhale 2 puffs into the lungs every 6 (six) hours as needed for wheezing or shortness of breath.      . ALPRAZolam (XANAX) 1 MG tablet Take 1 mg by mouth daily.       Marland Kitchen aspirin 81 MG tablet Take  81 mg by mouth daily.      Marland Kitchen atorvastatin (LIPITOR) 20 MG tablet Take 20 mg by mouth daily.      . clopidogrel (PLAVIX) 75 MG tablet Take 1 tablet (75 mg total) by mouth daily with breakfast.  90 tablet  3  . diphenhydrAMINE (SOMINEX) 25 MG tablet Take 25 mg by mouth at bedtime as needed for sleep.      Marland Kitchen docusate sodium (COLACE) 100 MG capsule Take 100 mg by mouth daily.       . famotidine (PEPCID) 20 MG tablet Take 20 mg by mouth as needed for heartburn or indigestion.      . furosemide (LASIX) 20 MG tablet Take 20 mg by mouth daily.      . isosorbide mononitrate (IMDUR) 30 MG 24 hr tablet Take 1 tablet (30 mg total) by mouth daily.  90 tablet  3  . levothyroxine (SYNTHROID, LEVOTHROID) 25 MCG tablet Take 25 mcg by mouth daily before breakfast.      . metoprolol tartrate (LOPRESSOR) 25 MG tablet Take 25 mg by mouth 2 (two) times daily.      . nitroGLYCERIN (NITROSTAT) 0.4 MG SL tablet Place 0.4 mg under the tongue every 5 (five) minutes as needed for chest pain.      . pantoprazole (PROTONIX) 40 MG tablet Take 40 mg by mouth daily.      . pramipexole (MIRAPEX) 1 MG tablet Take 1.5 mg  by mouth daily.       . sertraline (ZOLOFT) 100 MG tablet Take 200 mg by mouth daily. 2 tabs at bedtime      . theophylline (THEODUR) 200 MG 12 hr tablet Take 200 mg by mouth 2 (two) times daily.      . traZODone (DESYREL) 100 MG tablet Take 200 mg by mouth at bedtime.       No current facility-administered medications for this visit.    Past Medical History  Diagnosis Date  . Hypothyroid   . Stroke   . Syncope and collapse   . MI (myocardial infarction)     x2  . COPD (chronic obstructive pulmonary disease)   . Depression   . Insomnia   . Restless leg syndrome   . Coronary artery disease   . Bulging disc   . Chronic kidney disease, stage III (moderate)   . Macular degeneration   . Arteriosclerosis of coronary artery bypass graft   . Tachycardia   . Pacemaker     [employer]  . Esophageal  reflux   . Unspecified hearing loss   . Mixed hyperlipidemia   . PVT (paroxysmal ventricular tachycardia)     Past Surgical History  Procedure Laterality Date  . Coronary artery bypass graft      x 4  . Cardiac catheterization  2007    x2;cone  . Cardiac catheterization  2008  . Cerebral angiogram    . Lumbar disc surgery    . Gallbladder surgery    . Partial hysterectomy    . Knee arthroscopy    . Cataract extraction Right   . Insert / replace / remove pacemaker  2013    [place], [place]; [employer]  . Colonoscopy      History   Social History  . Marital Status: Married    Spouse Name: N/A    Number of Children: N/A  . Years of Education: N/A   Occupational History  . Not on file.   Social History Main Topics  . Smoking status: Former Smoker    Types: Cigarettes    Quit date: 05/27/2006  . Smokeless tobacco: Never Used  . Alcohol Use: No  . Drug Use: No  . Sexual Activity: Not on file   Other Topics Concern  . Not on file   Social History Narrative  . No narrative on file     Filed Vitals:   05/27/14 1441  BP: 122/60  Pulse: 83  Height: 5\' 2"  (1.575 m)  Weight: 185 lb (83.915 kg)  SpO2: 97%    PHYSICAL EXAM General: NAD HEENT: Normal. Neck: No JVD, no thyromegaly. Lungs: Clear to auscultation bilaterally with normal respiratory effort. CV: Nondisplaced PMI.  Regular rate and rhythm, normal S1/S2, no S3/S4, no murmur. No pretibial or periankle edema.  No carotid bruit.  Normal pedal pulses.  Abdomen: Soft, nontender, no hepatosplenomegaly, no distention.  Neurologic: Alert and oriented x 3.  Psych: Normal affect. Skin: Normal. Musculoskeletal: Normal range of motion, no gross deformities. Extremities: No clubbing or cyanosis.   ECG: Most recent ECG reviewed.      ASSESSMENT AND PLAN: 1. CAD s/p CABG: She continues to experience chest discomfort. Nuclear myocardial perfusion study normal in November 2014. I will increase Imdur  to 60 mg daily. I am also increasing metoprolol to 50 g twice daily. Continue aspirin and statin therapy. 2. Essential HTN: Well controlled on current therapy. 3. Hyperlipidemia: I will check lipids and LFTs. Continue  low intensity statin therapy for the time being with Lipitor 20 mg daily. 4. CVA: She is on both aspirin and Plavix although Plavix can be discontinued. 5. COPD: Continue therapy with albuterol inhaler as needed. She quit smoking 8 or 9 years ago. 6. Palpitations/NSVT/pacemaker: Device interrogation in February 2015 as documented above with two 7-beat runs of nonsustained ventricular tachycardia. I will have her device interrogated on 9/11 given her recent episode of palpitations associated with chest discomfort. I will increase metoprolol to 50 mg twice daily. She is due to see Dr. Lovena Le in the near future.  Dispo: f/u 2 months.  Time spent: 40 minutes, of which >50% counseling on effects of medications for CV disease.  Kate Sable, M.D., F.A.C.C.

## 2014-05-27 NOTE — Patient Instructions (Signed)
Your physician recommends that you schedule a follow-up appointment in: 2 months   We will make you an apt to see Melton Alar in device clinic   Your physician has recommended you make the following change in your medication:     INCREASE Imdur to 30 mg twice a day  INCREASE Metoprolol to 50 mg twice a day   Please have fasting blood work (LFT'S,Lipid) done     Thank you for choosing [facility] !

## 2014-05-28 ENCOUNTER — Other Ambulatory Visit: Payer: Self-pay | Admitting: *Deleted

## 2014-05-28 DIAGNOSIS — R079 Chest pain, unspecified: Secondary | ICD-10-CM

## 2014-05-28 NOTE — Progress Notes (Signed)
FYI:  T4 free, TSH added. Pt notified and will have labs drawn tomorrow

## 2014-05-30 ENCOUNTER — Telehealth: Payer: Self-pay | Admitting: *Deleted

## 2014-05-30 LAB — T4, FREE: FREE T4: 0.92 ng/dL (ref 0.80–1.80)

## 2014-05-30 LAB — TSH: TSH: 3.855 u[IU]/mL (ref 0.350–4.500)

## 2014-05-30 LAB — HEPATIC FUNCTION PANEL
ALBUMIN: 4.5 g/dL (ref 3.5–5.2)
ALK PHOS: 103 U/L (ref 39–117)
ALT: 10 U/L (ref 0–35)
AST: 16 U/L (ref 0–37)
BILIRUBIN TOTAL: 0.5 mg/dL (ref 0.2–1.2)
Bilirubin, Direct: 0.1 mg/dL (ref 0.0–0.3)
Indirect Bilirubin: 0.4 mg/dL (ref 0.2–1.2)
Total Protein: 7 g/dL (ref 6.0–8.3)

## 2014-05-30 LAB — LIPID PANEL
Cholesterol: 165 mg/dL (ref 0–200)
HDL: 83 mg/dL (ref >39)
LDL CALC: 68 mg/dL (ref 0–99)
Total CHOL/HDL Ratio: 2 Ratio
Triglycerides: 68 mg/dL (ref <150)
VLDL: 14 mg/dL (ref 0–40)

## 2014-05-30 NOTE — Telephone Encounter (Signed)
Pt doesn't have a PCP and needs Rx called in VIT D, ALPRAZOLAM, LEVOTHYROXINE. Pt stats that Dr Everette Rank said she had to many problems and could not keep her as a patient.

## 2014-05-30 NOTE — Telephone Encounter (Signed)
I  mailed pt a list of pcp's in the area including Dr.[place] and [facility]

## 2014-05-31 NOTE — Telephone Encounter (Signed)
I spoke with pt again and answered her questions

## 2014-05-31 NOTE — Telephone Encounter (Signed)
Pt is calling to speak with cathy again/tmj

## 2014-06-07 ENCOUNTER — Ambulatory Visit (INDEPENDENT_AMBULATORY_CARE_PROVIDER_SITE_OTHER): Payer: Medicare HMO | Admitting: *Deleted

## 2014-06-07 DIAGNOSIS — I495 Sick sinus syndrome: Secondary | ICD-10-CM

## 2014-06-07 LAB — MDC_IDC_ENUM_SESS_TYPE_INCLINIC
Implantable Pulse Generator Serial Number: 131914
Lead Channel Impedance Value: 517 Ohm
Lead Channel Pacing Threshold Amplitude: 0.4 V
Lead Channel Sensing Intrinsic Amplitude: 1.9 mV
Lead Channel Sensing Intrinsic Amplitude: 8.3 mV
Lead Channel Setting Pacing Amplitude: 2 V
Lead Channel Setting Pacing Amplitude: 2 V
Lead Channel Setting Sensing Sensitivity: 2.5 mV
MDC IDC MSMT BATTERY REMAINING LONGEVITY: 84 mo
MDC IDC MSMT LEADCHNL RA IMPEDANCE VALUE: 571 Ohm
MDC IDC MSMT LEADCHNL RA PACING THRESHOLD PULSEWIDTH: 0.4 ms
MDC IDC MSMT LEADCHNL RV PACING THRESHOLD AMPLITUDE: 0.7 V
MDC IDC MSMT LEADCHNL RV PACING THRESHOLD PULSEWIDTH: 0.4 ms
MDC IDC SESS DTM: 20150911040000
MDC IDC SET LEADCHNL RV PACING PULSEWIDTH: 0.4 ms
MDC IDC SET ZONE DETECTION INTERVAL: 400 ms
MDC IDC STAT BRADY RA PERCENT PACED: 9415 %

## 2014-06-07 NOTE — Progress Notes (Signed)
PPM check in office. 

## 2014-06-18 ENCOUNTER — Encounter: Payer: Self-pay | Admitting: Internal Medicine

## 2014-06-19 ENCOUNTER — Encounter: Payer: Self-pay | Admitting: *Deleted

## 2014-06-28 ENCOUNTER — Encounter: Payer: Commercial Managed Care - HMO | Admitting: Internal Medicine

## 2014-07-05 ENCOUNTER — Encounter: Payer: Self-pay | Admitting: Family Medicine

## 2014-07-05 ENCOUNTER — Ambulatory Visit (INDEPENDENT_AMBULATORY_CARE_PROVIDER_SITE_OTHER): Payer: Commercial Managed Care - HMO | Admitting: Family Medicine

## 2014-07-05 VITALS — BP 100/62 | HR 68 | Temp 97.6°F | Resp 18 | Ht 62.0 in | Wt 194.0 lb

## 2014-07-05 DIAGNOSIS — N184 Chronic kidney disease, stage 4 (severe): Secondary | ICD-10-CM

## 2014-07-05 DIAGNOSIS — J438 Other emphysema: Secondary | ICD-10-CM

## 2014-07-05 DIAGNOSIS — I2510 Atherosclerotic heart disease of native coronary artery without angina pectoris: Secondary | ICD-10-CM

## 2014-07-05 DIAGNOSIS — Z23 Encounter for immunization: Secondary | ICD-10-CM

## 2014-07-05 DIAGNOSIS — I10 Essential (primary) hypertension: Secondary | ICD-10-CM

## 2014-07-05 LAB — BASIC METABOLIC PANEL WITH GFR
BUN: 22 mg/dL (ref 6–23)
CO2: 30 meq/L (ref 19–32)
Calcium: 9.3 mg/dL (ref 8.4–10.5)
Chloride: 102 mEq/L (ref 96–112)
Creat: 1.42 mg/dL — ABNORMAL HIGH (ref 0.50–1.10)
GFR, Est African American: 44 mL/min — ABNORMAL LOW
GFR, Est Non African American: 38 mL/min — ABNORMAL LOW
Glucose, Bld: 91 mg/dL (ref 70–99)
POTASSIUM: 4.9 meq/L (ref 3.5–5.3)
SODIUM: 142 meq/L (ref 135–145)

## 2014-07-05 NOTE — Progress Notes (Signed)
Subjective:    Patient ID: [name], female    DOB: 08-10-1946, 68 y.o.   MRN: [id number]  HPI  Patient is a very pleasant 68 year old white female who is here today to establish care. She has a complicated past medical history including coronary artery disease. In 2008, patient underwent cardiac catheterization and had 2 stents placed in her heart. 2 months later the patient underwent a quadruple bypass. She also had episode of syncope. This was due to the SA node dysfunction. Therefore the patient has a pacemaker. Chest history of severe COPD. She is currently on theophylline. I reviewed her last cardiology office visit. At that time, her cardiologist reports 2 episodes of nonsustained ventricular tachycardia. The patient also complains of occasional chest pain and palpitations. I am concerned theophylline may be contributing to this. The patient has tried and failed Symbicort in the past due to the fact she felt like it did not help. Chest has a history of hypertension. Her blood pressures well controlled at 100/62. She has a history of hyperlipidemia. I reviewed her most recent lipid panel from September showed an LDL cholesterol 68 which is below the 70. She also has a history of insomnia. She takes trazodone 200 mg by mouth daily and Xanax 1 mg by mouth each bedtime for insomnia. I believe theophylline may be contributing to this as well. I am fine continuing with Xanax lifestyle get this patient off theophylline due to the risk of cardiac complications.  Patient also has a history of chronic kidney disease. 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 I do not have a baseline creatinine to compare. TSH, lipid panel, CBC were all checked in September and were normal.  Patient is due for a booster of Pneumovax 23. She is also due for Prevnar 13. She is due for a flu shot. She refuses a mammogram or colonoscopy. She requested be made a DO NOT  RESUSCITATE. Past Medical History  Diagnosis Date  . Hypothyroid   . Stroke   . Syncope and collapse   . MI (myocardial infarction)     x2  . COPD (chronic obstructive pulmonary disease)   . Depression   . Insomnia   . Restless leg syndrome   . Coronary artery disease   . Bulging disc   . Chronic kidney disease, stage III (moderate)   . Macular degeneration   . Arteriosclerosis of coronary artery bypass graft   . Tachycardia   . Pacemaker     [employer]  . Esophageal reflux   . Unspecified hearing loss   . Mixed hyperlipidemia   . PVT (paroxysmal ventricular tachycardia)   . Anxiety   . Blood transfusion without reported diagnosis     10/29/2006  . Cataract 05/2012  . Osteoporosis    Past Surgical History  Procedure Laterality Date  . Coronary artery bypass graft      x 4  . Cardiac catheterization  2007    x2;cone  . Cardiac catheterization  2008  . Cerebral angiogram    . Lumbar disc surgery    . Gallbladder surgery    . Partial hysterectomy    . Knee arthroscopy    . Cataract extraction Right   . Insert / replace / remove pacemaker  2013    [place], [place]; [employer]  . Colonoscopy    . Eye surgery  05/2013    cataract removed  . Spine  surgery  1999/2001/2006    3 disc replaced   Current Outpatient Prescriptions on File Prior to Visit  Medication Sig Dispense Refill  . albuterol (PROVENTIL HFA;VENTOLIN HFA) 108 (90 BASE) MCG/ACT inhaler Inhale 2 puffs into the lungs every 6 (six) hours as needed for wheezing or shortness of breath.      Marland Kitchen aspirin 81 MG tablet Take 81 mg by mouth daily.      Marland Kitchen atorvastatin (LIPITOR) 20 MG tablet Take 20 mg by mouth daily.      . clopidogrel (PLAVIX) 75 MG tablet Take 1 tablet (75 mg total) by mouth daily with breakfast.  90 tablet  3  . furosemide (LASIX) 20 MG tablet Take 20 mg by mouth daily.      . isosorbide mononitrate (IMDUR) 30 MG 24 hr tablet Take 1 tablet (30 mg total) by mouth 2 (two) times  daily.  180 tablet  3  . levothyroxine (SYNTHROID, LEVOTHROID) 25 MCG tablet Take 25 mcg by mouth daily before breakfast.      . metoprolol (LOPRESSOR) 50 MG tablet Take 1 tablet (50 mg total) by mouth 2 (two) times daily.  180 tablet  3  . nitroGLYCERIN (NITROSTAT) 0.4 MG SL tablet Place 0.4 mg under the tongue every 5 (five) minutes as needed for chest pain.      . pantoprazole (PROTONIX) 40 MG tablet Take 40 mg by mouth daily.      . pramipexole (MIRAPEX) 1 MG tablet Take 1.5 mg by mouth daily.       . sertraline (ZOLOFT) 100 MG tablet Take 200 mg by mouth daily. 2 tabs at bedtime      . theophylline (THEODUR) 200 MG 12 hr tablet Take 200 mg by mouth 2 (two) times daily.      . traZODone (DESYREL) 100 MG tablet Take 200 mg by mouth at bedtime.       No current facility-administered medications on file prior to visit.   Allergies  Allergen Reactions  . Codeine     swelling  . Iohexol      Desc: PT HAD CONTRAST REACTION YRS AGO NEEDS 13 HR PREP FOR ALL CONTRAST STUDIES    History   Social History  . Marital Status: Married    Spouse Name: N/A    Number of Children: N/A  . Years of Education: N/A   Occupational History  . Not on file.   Social History Main Topics  . Smoking status: Former Smoker    Types: Cigarettes    Quit date: 05/27/2006  . Smokeless tobacco: Never Used  . Alcohol Use: No  . Drug Use: No  . Sexual Activity: Not Currently   Other Topics Concern  . Not on file   Social History Narrative  . No narrative on file   Family History  Problem Relation Age of Onset  . Stomach cancer Mother   . Diabetes Mother   . Hyperlipidemia Mother   . Hypertension Father   . Lung cancer Father   . Alcohol abuse Father   . Early death Brother      Review of Systems  All other systems reviewed and are negative.      Objective:   Physical Exam  Vitals reviewed. Constitutional: She is oriented to person, place, and time. She appears well-developed and  well-nourished. No distress.  HENT:  Head: Normocephalic and atraumatic.  Right Ear: External ear normal.  Left Ear: External ear normal.  Nose: Nose normal.  Mouth/Throat: Oropharynx  is clear and moist. No oropharyngeal exudate.  Eyes: Conjunctivae and EOM are normal. Pupils are equal, round, and reactive to light. No scleral icterus.  Neck: Neck supple. No JVD present. No thyromegaly present.  Cardiovascular: Normal rate, regular rhythm and normal heart sounds.  Exam reveals no gallop and no friction rub.   No murmur heard. Pulmonary/Chest: Effort normal and breath sounds normal. No respiratory distress. She has no wheezes. She has no rales. She exhibits no tenderness.  Abdominal: Soft. Bowel sounds are normal. She exhibits no distension and no mass. There is no tenderness. There is no rebound and no guarding.  Musculoskeletal: Normal range of motion. She exhibits no edema and no tenderness.  Lymphadenopathy:    She has no cervical adenopathy.  Neurological: She is alert and oriented to person, place, and time. She has normal reflexes. She displays normal reflexes. No cranial nerve deficit. She exhibits normal muscle tone. Coordination normal.  Skin: No rash noted. She is not diaphoretic.          Assessment & Plan:  Chronic kidney disease (CKD), stage IV (severe) - Plan: BASIC METABOLIC PANEL WITH GFR  Need for prophylactic vaccination and inoculation against influenza - Plan: Flu Vaccine QUAD 36+ mos IM  Need for prophylactic vaccination against Streptococcus pneumoniae (pneumococcus) - Plan: Pneumococcal polysaccharide vaccine 23-valent greater than or equal to 2yo subcutaneous/IM  Other emphysema  Essential hypertension  Atherosclerosis of native coronary artery of native heart without angina pectoris   Due to the patient's history of coronary artery disease and her history of tachyarrhythmias, I like the patient discontinue theophylline. I replaced theophylline with Anoro  1 inh Qday.  I gave the patient samples for 2 we'll ask her to call me when the samples run out so that I can refill the medication if it is beneficial. I also give the patient Pneumovax 23 today. She also received a flu shot today. I recommended Prevnar 13 in one year. I recommended a mammogram as well as a colonoscopy but the patient refused. She states that she would not do anything if an abnormality was found and therefore does not want to undergo screening. She also requests be made a DO NOT RESUSCITATE. She wants no extraordinary measures taken if her heart was stop or she would stop breathing. Therefore I gave the patient DO NOT RESUSCITATE form to have in her possession and her home.  I will check a baseline creatinine.

## 2014-07-08 ENCOUNTER — Encounter: Payer: Self-pay | Admitting: Family Medicine

## 2014-07-11 ENCOUNTER — Other Ambulatory Visit: Payer: Self-pay | Admitting: Family Medicine

## 2014-07-11 MED ORDER — ALBUTEROL SULFATE (2.5 MG/3ML) 0.083% IN NEBU: 2.5000 mg | INHALATION_SOLUTION | RESPIRATORY_TRACT | Status: DC | PRN

## 2014-07-11 MED ORDER — ALPRAZOLAM 1 MG PO TABS: 1.0000 mg | ORAL_TABLET | Freq: Every day | ORAL | Status: DC

## 2014-07-11 NOTE — Telephone Encounter (Signed)
[employer] nurse called requesting a nebulizer machine for pt and a refill on her xanax. Per [facility] ok to refill Xanax and ok on ned machine and albuterol 2.5mg  via neb q 4 hours prn wheezing and cough. Meds sent to pharm and order for neb machine sent to [facility] (per nurse this is who her insurance uses).

## 2014-07-12 ENCOUNTER — Other Ambulatory Visit: Payer: Self-pay

## 2014-07-29 ENCOUNTER — Encounter: Payer: Self-pay | Admitting: Cardiovascular Disease

## 2014-07-29 ENCOUNTER — Ambulatory Visit (INDEPENDENT_AMBULATORY_CARE_PROVIDER_SITE_OTHER): Payer: Commercial Managed Care - HMO | Admitting: Cardiovascular Disease

## 2014-07-29 VITALS — BP 90/62 | HR 81 | Ht 62.0 in | Wt 197.0 lb

## 2014-07-29 DIAGNOSIS — J438 Other emphysema: Secondary | ICD-10-CM

## 2014-07-29 DIAGNOSIS — I4729 Other ventricular tachycardia: Secondary | ICD-10-CM

## 2014-07-29 DIAGNOSIS — E785 Hyperlipidemia, unspecified: Secondary | ICD-10-CM

## 2014-07-29 DIAGNOSIS — I10 Essential (primary) hypertension: Secondary | ICD-10-CM

## 2014-07-29 DIAGNOSIS — I472 Ventricular tachycardia: Secondary | ICD-10-CM

## 2014-07-29 DIAGNOSIS — Z8673 Personal history of transient ischemic attack (TIA), and cerebral infarction without residual deficits: Secondary | ICD-10-CM

## 2014-07-29 DIAGNOSIS — N189 Chronic kidney disease, unspecified: Secondary | ICD-10-CM

## 2014-07-29 DIAGNOSIS — I25768 Atherosclerosis of bypass graft of coronary artery of transplanted heart with other forms of angina pectoris: Secondary | ICD-10-CM

## 2014-07-29 DIAGNOSIS — Z45018 Encounter for adjustment and management of other part of cardiac pacemaker: Secondary | ICD-10-CM

## 2014-07-29 MED ORDER — METOPROLOL TARTRATE 25 MG PO TABS: 25.0000 mg | ORAL_TABLET | Freq: Two times a day (BID) | ORAL | Status: DC

## 2014-07-29 MED ORDER — ISOSORBIDE MONONITRATE ER 30 MG PO TB24: 30.0000 mg | ORAL_TABLET | Freq: Every day | ORAL | Status: DC

## 2014-07-29 NOTE — Patient Instructions (Signed)
Your physician recommends that you schedule a follow-up appointment in: 3 months with Dr. Bronson Ing  Your physician has recommended you make the following change in your medication:   DECREASE IMDUR 30 MG DAILY  DECREASE METOPROLOL 25 MG TWICE DAILY  Thank you for choosing [facility]!!

## 2014-07-29 NOTE — Progress Notes (Signed)
Patient ID: [name], female   DOB: 19-Jul-1946, 68 y.o.   MRN: [id number]      SUBJECTIVE: The patient returns for routine follow up. Device interrogation on 9/11/5 demonstrated normal device function with no high ventricular rates noted. She has since established with a PCP and refused mammogram and colonoscopy, and requested her code status be DNR. She has a history of coronary artery bypass graft surgery, pacemaker, COPD, essential hypertension, hyperlipidemia, CKD and CVA. She had a normal nuclear myocardial perfusion study on 08/10/2013. She has chronic lightheadedness but denies frank syncope and falls. 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 The increased doses of metoprolol and Imdur did not provide any relief.    Review of Systems: As per "subjective", otherwise negative.  Allergies  Allergen Reactions  . Codeine     swelling  . Iohexol      Desc: PT HAD CONTRAST REACTION YRS AGO NEEDS 13 HR PREP FOR ALL CONTRAST STUDIES     Current Outpatient Prescriptions  Medication Sig Dispense Refill  . albuterol (PROVENTIL HFA;VENTOLIN HFA) 108 (90 BASE) MCG/ACT inhaler Inhale 2 puffs into the lungs every 6 (six) hours as needed for wheezing or shortness of breath.    Marland Kitchen albuterol (PROVENTIL) (2.5 MG/3ML) 0.083% nebulizer solution Take 3 mLs (2.5 mg total) by nebulization every 4 (four) hours as needed for wheezing or shortness of breath. 150 mL 1  . ALPRAZolam (XANAX) 1 MG tablet Take 1 tablet (1 mg total) by mouth at bedtime. 30 tablet 2  . aspirin 81 MG tablet Take 81 mg by mouth daily.    Marland Kitchen atorvastatin (LIPITOR) 20 MG tablet Take 20 mg by mouth daily.    . clopidogrel (PLAVIX) 75 MG tablet Take 1 tablet (75 mg total) by mouth daily with breakfast. 90 tablet 3  . furosemide (LASIX) 20 MG tablet Take 20 mg by mouth daily.    . isosorbide mononitrate (IMDUR) 30 MG 24 hr tablet Take 1 tablet (30 mg total) by mouth 2 (two) times daily. 180 tablet 3  . levothyroxine  (SYNTHROID, LEVOTHROID) 25 MCG tablet Take 25 mcg by mouth daily before breakfast.    . metoprolol (LOPRESSOR) 50 MG tablet Take 1 tablet (50 mg total) by mouth 2 (two) times daily. 180 tablet 3  . nitroGLYCERIN (NITROSTAT) 0.4 MG SL tablet Place 0.4 mg under the tongue every 5 (five) minutes as needed for chest pain.    . pantoprazole (PROTONIX) 40 MG tablet Take 40 mg by mouth daily.    . pramipexole (MIRAPEX) 1 MG tablet Take 1.5 mg by mouth daily.     . sertraline (ZOLOFT) 100 MG tablet Take 200 mg by mouth daily. 2 tabs at bedtime    . theophylline (THEODUR) 200 MG 12 hr tablet Take 200 mg by mouth 2 (two) times daily.    . traZODone (DESYREL) 100 MG tablet Take 200 mg by mouth at bedtime.     No current facility-administered medications for this visit.    Past Medical History  Diagnosis Date  . Hypothyroid   . Stroke   . Syncope and collapse   . MI (myocardial infarction)     x2  . COPD (chronic obstructive pulmonary disease)   . Depression   . Insomnia   . Restless leg syndrome   . Coronary artery disease   . Bulging disc   . Chronic kidney disease, stage III (moderate)   . Macular degeneration   . Arteriosclerosis of coronary artery  bypass graft   . Tachycardia   . Pacemaker     [employer]  . Esophageal reflux   . Unspecified hearing loss   . Mixed hyperlipidemia   . PVT (paroxysmal ventricular tachycardia)   . Anxiety   . Blood transfusion without reported diagnosis     10/29/2006  . Cataract 05/2012  . Osteoporosis     Past Surgical History  Procedure Laterality Date  . Coronary artery bypass graft      x 4  . Cardiac catheterization  2007    x2;cone  . Cardiac catheterization  2008  . Cerebral angiogram    . Lumbar disc surgery    . Gallbladder surgery    . Partial hysterectomy    . Knee arthroscopy    . Cataract extraction Right   . Insert / replace / remove pacemaker  2013    [place], [place]; [employer]  . Colonoscopy    . Eye  surgery  05/2013    cataract removed  . Spine surgery  1999/2001/2006    3 disc replaced    History   Social History  . Marital Status: Married    Spouse Name: N/A    Number of Children: N/A  . Years of Education: N/A   Occupational History  . Not on file.   Social History Main Topics  . Smoking status: Former Smoker    Types: Cigarettes    Quit date: 05/27/2006  . Smokeless tobacco: Never Used  . Alcohol Use: No  . Drug Use: No  . Sexual Activity: Not Currently   Other Topics Concern  . Not on file   Social History Narrative  . No narrative on file    BP 90/62  Pulse 81  Weight 197 lb (89.359 kg) Height 5\' 2"  (1.575 m)    PHYSICAL EXAM General: NAD HEENT: Normal. Neck: No JVD, no thyromegaly. Lungs: Clear to auscultation bilaterally with normal respiratory effort. CV: Nondisplaced PMI.  Regular rate and rhythm, normal S1/S2, no S3/S4, no murmur. No pretibial or periankle edema.  No carotid bruit.  Normal pedal pulses.  Abdomen: Soft, nontender, no hepatosplenomegaly, no distention.  Neurologic: Alert and oriented x 3.  Psych: Normal affect. Skin: Normal. Musculoskeletal: Normal range of motion, no gross deformities. Extremities: No clubbing or cyanosis.   ECG: Most recent ECG reviewed.      ASSESSMENT AND PLAN: 1. CAD s/p CABG: Stable ischemic heart disease. Nuclear myocardial perfusion study normal in November 2014. As the increase of Imdur to 60 mg daily did not provide any relief (and b/c of low normal BP), I will reduce to 30 mg daily. I will also reduce metoprolol to 25 mg twice daily. Continue aspirin and statin therapy. 2. Essential HTN: Low normal. Reduce Imdur and beta blocker dose as noted above. 3. Hyperlipidemia: HDL 83, LDL 68 on 05/30/14 . Continue low intensity statin therapy with Lipitor 20 mg daily. 4. CVA: She is on both aspirin and Plavix. Stable, no changes. 5. COPD: Continue therapy with albuterol inhaler as needed. She quit smoking 8  or 9 years ago. 6. Palpitations/NSVT/pacemaker: Device interrogation as noted above with no sustained ventricular arrhythmias. I will continue metoprolol 50 mg twice daily. She is due to see Dr. Lovena Le in the near future. 7. CKD stage 4: BUN 22, creat 1.42 on 07/05/14.   Dispo: f/u 3-4 months.   Kate Sable, M.D., F.A.C.C.

## 2014-08-09 ENCOUNTER — Telehealth: Payer: Self-pay | Admitting: *Deleted

## 2014-08-09 MED ORDER — BUDESONIDE-FORMOTEROL FUMARATE 160-4.5 MCG/ACT IN AERO: 2.0000 | INHALATION_SPRAY | Freq: Two times a day (BID) | RESPIRATORY_TRACT | Status: DC

## 2014-08-09 NOTE — Telephone Encounter (Signed)
Pt aware to stop using ANORO and to begin symbicort, sent to pt pharmacy [facility]

## 2014-08-09 NOTE — Telephone Encounter (Signed)
Discontinue anoro and switch to symbicort 160/4.5 2 puff inh bid.

## 2014-08-09 NOTE — Telephone Encounter (Signed)
Received a call today from pt stating that she was here a couple weeks ago with COPD and stated that provider had given her samples of ANORA inhalation to try and told to call back if was beneficial. Pt states that she has developed a cough and cause her chest to hurt. Pt wants to know if there is anything else you can prescribe to her? MD please advise?  Call back number is [phone]  [place]

## 2014-08-20 ENCOUNTER — Telehealth: Payer: Self-pay | Admitting: Family Medicine

## 2014-08-20 MED ORDER — TRAZODONE HCL 100 MG PO TABS: 200.0000 mg | ORAL_TABLET | Freq: Every day | ORAL | Status: DC

## 2014-08-20 NOTE — Telephone Encounter (Signed)
Med sent to pharmacy.

## 2014-08-20 NOTE — Telephone Encounter (Signed)
ok 

## 2014-08-20 NOTE — Telephone Encounter (Signed)
Patient is calling to get refill on trazadone if possible  [phone]

## 2014-08-20 NOTE — Telephone Encounter (Signed)
?   OK to Refill  

## 2014-08-29 ENCOUNTER — Encounter: Payer: Self-pay | Admitting: Internal Medicine

## 2014-08-29 ENCOUNTER — Ambulatory Visit (INDEPENDENT_AMBULATORY_CARE_PROVIDER_SITE_OTHER): Payer: Commercial Managed Care - HMO | Admitting: Internal Medicine

## 2014-08-29 VITALS — BP 112/78 | HR 78 | Ht 62.0 in | Wt 198.0 lb

## 2014-08-29 DIAGNOSIS — I495 Sick sinus syndrome: Secondary | ICD-10-CM

## 2014-08-29 DIAGNOSIS — I252 Old myocardial infarction: Secondary | ICD-10-CM

## 2014-08-29 DIAGNOSIS — I10 Essential (primary) hypertension: Secondary | ICD-10-CM

## 2014-08-29 DIAGNOSIS — Z45018 Encounter for adjustment and management of other part of cardiac pacemaker: Secondary | ICD-10-CM

## 2014-08-29 DIAGNOSIS — I471 Supraventricular tachycardia: Secondary | ICD-10-CM

## 2014-08-29 LAB — MDC_IDC_ENUM_SESS_TYPE_INCLINIC
Brady Statistic RV Percent Paced: 12 %
Lead Channel Sensing Intrinsic Amplitude: 7.5 mV
Lead Channel Setting Pacing Amplitude: 2 V
Lead Channel Setting Pacing Pulse Width: 0.4 ms
Lead Channel Setting Sensing Sensitivity: 2.5 mV
MDC IDC MSMT LEADCHNL RA IMPEDANCE VALUE: 569 Ohm
MDC IDC MSMT LEADCHNL RA PACING THRESHOLD AMPLITUDE: 0.5 V
MDC IDC MSMT LEADCHNL RA PACING THRESHOLD PULSEWIDTH: 0.4 ms
MDC IDC MSMT LEADCHNL RA SENSING INTR AMPL: 1.7 mV
MDC IDC MSMT LEADCHNL RV IMPEDANCE VALUE: 545 Ohm
MDC IDC MSMT LEADCHNL RV PACING THRESHOLD AMPLITUDE: 0.8 V
MDC IDC MSMT LEADCHNL RV PACING THRESHOLD PULSEWIDTH: 0.4 ms
MDC IDC PG SERIAL: 131914
MDC IDC SESS DTM: 20151203050000
MDC IDC SET LEADCHNL RV PACING AMPLITUDE: 2.4 V
MDC IDC STAT BRADY RA PERCENT PACED: 98 %
Zone Setting Detection Interval: 400 ms

## 2014-08-29 NOTE — Assessment & Plan Note (Signed)
She denies anginal symptoms. She will continue her current meds.  

## 2014-08-29 NOTE — Progress Notes (Signed)
HPI Mrs. Lacorte returns today for followup. She had seen Dr. Caryl Comes in the past in our [place] office but has moved from [place] to [place]. She has an extensive medical history including CAD, s/p CABG 2008, HTN, symptomatic bradycardia s/p PPM, and dyslipidemia. She recently injured her right foot and it remains bruised but she has refused medical evaluation. No chest pain or sob. No edema. No syncope.  Allergies  Allergen Reactions  . Codeine     swelling  . Iohexol      Desc: PT HAD CONTRAST REACTION YRS AGO NEEDS 13 HR PREP FOR ALL CONTRAST STUDIES      Current Outpatient Prescriptions  Medication Sig Dispense Refill  . albuterol (PROVENTIL HFA;VENTOLIN HFA) 108 (90 BASE) MCG/ACT inhaler Inhale 2 puffs into the lungs every 6 (six) hours as needed for wheezing or shortness of breath.    Marland Kitchen albuterol (PROVENTIL) (2.5 MG/3ML) 0.083% nebulizer solution Take 3 mLs (2.5 mg total) by nebulization every 4 (four) hours as needed for wheezing or shortness of breath. 150 mL 1  . ALPRAZolam (XANAX) 1 MG tablet Take 1 tablet (1 mg total) by mouth at bedtime. 30 tablet 2  . aspirin 81 MG tablet Take 81 mg by mouth daily.    Marland Kitchen atorvastatin (LIPITOR) 20 MG tablet Take 20 mg by mouth daily.    . clopidogrel (PLAVIX) 75 MG tablet Take 1 tablet (75 mg total) by mouth daily with breakfast. 90 tablet 3  . furosemide (LASIX) 20 MG tablet Take 20 mg by mouth daily.    . isosorbide mononitrate (IMDUR) 30 MG 24 hr tablet Take 1 tablet (30 mg total) by mouth daily. 180 tablet 3  . levothyroxine (SYNTHROID, LEVOTHROID) 25 MCG tablet Take 25 mcg by mouth daily before breakfast.    . metoprolol (LOPRESSOR) 25 MG tablet Take 1 tablet (25 mg total) by mouth 2 (two) times daily. 60 tablet 3  . nitroGLYCERIN (NITROSTAT) 0.4 MG SL tablet Place 0.4 mg under the tongue every 5 (five) minutes as needed for chest pain.    . pantoprazole (PROTONIX) 40 MG tablet Take 40 mg by mouth daily.    . pramipexole  (MIRAPEX) 1 MG tablet Take 1.5 mg by mouth daily.     . sertraline (ZOLOFT) 100 MG tablet Take 200 mg by mouth daily. 2 tabs at bedtime    . traZODone (DESYREL) 100 MG tablet Take 2 tablets (200 mg total) by mouth at bedtime. 60 tablet 2   No current facility-administered medications for this visit.     Past Medical History  Diagnosis Date  . Hypothyroid   . Stroke   . Syncope and collapse   . MI (myocardial infarction)     x2  . COPD (chronic obstructive pulmonary disease)   . Depression   . Insomnia   . Restless leg syndrome   . Coronary artery disease   . Bulging disc   . Chronic kidney disease, stage III (moderate)   . Macular degeneration   . Arteriosclerosis of coronary artery bypass graft   . Tachycardia   . Pacemaker     [employer]  . Esophageal reflux   . Unspecified hearing loss   . Mixed hyperlipidemia   . PVT (paroxysmal ventricular tachycardia)   . Anxiety   . Blood transfusion without reported diagnosis     10/29/2006  . Cataract 05/2012  . Osteoporosis     ROS:   All systems reviewed and negative except as  noted in the HPI.   Past Surgical History  Procedure Laterality Date  . Coronary artery bypass graft      x 4  . Cardiac catheterization  2007    x2;cone  . Cardiac catheterization  2008  . Cerebral angiogram    . Lumbar disc surgery    . Gallbladder surgery    . Partial hysterectomy    . Knee arthroscopy    . Cataract extraction Right   . Insert / replace / remove pacemaker  2013    [place], [place]; [employer]  . Colonoscopy    . Eye surgery  05/2013    cataract removed  . Spine surgery  1999/2001/2006    3 disc replaced     Family History  Problem Relation Age of Onset  . Stomach cancer Mother   . Diabetes Mother   . Hyperlipidemia Mother   . Hypertension Father   . Lung cancer Father   . Alcohol abuse Father   . Early death Brother      History   Social History  . Marital Status: Married    Spouse  Name: N/A    Number of Children: N/A  . Years of Education: N/A   Occupational History  . Not on file.   Social History Main Topics  . Smoking status: Former Smoker -- 0.75 packs/day    Types: Cigarettes    Start date: 09/28/1965    Quit date: 05/27/2006  . Smokeless tobacco: Never Used  . Alcohol Use: No  . Drug Use: No  . Sexual Activity: Not Currently   Other Topics Concern  . Not on file   Social History Narrative     BP 112/78 mmHg  Pulse 78  Ht 5\' 2"  (1.575 m)  Wt 198 lb (89.812 kg)  BMI 36.21 kg/m2  Physical Exam:  Well appearing middle aged woman, NAD HEENT: Unremarkable Neck:  No JVD, no thyromegally Lymphatics:  No adenopathy Back:  No CVA tenderness Lungs:  Clear with no wheezes HEART:  Regular rate rhythm, no murmurs, no rubs, no clicks Abd:  soft, positive bowel sounds, no organomegally, no rebound, no guarding Ext:  2 plus pulses, trace edema in right lowere leg, no cyanosis, no clubbing Skin:  No rashes no nodules Neuro:  CN II through XII intact, motor grossly intact   DEVICE  Normal device function.  See PaceArt for details.   Assess/Plan:

## 2014-08-29 NOTE — Assessment & Plan Note (Signed)
Her [employer] DDD PM is working normally. Will recheck in several months.

## 2014-08-29 NOTE — Patient Instructions (Addendum)
Your physician wants you to follow-up in: [place] will receive a reminder letter in the mail two months in advance. If you don't receive a letter, please call our office to schedule the follow-up appointment.  Remote monitoring is used to monitor your Pacemaker of ICD from home. This monitoring reduces the number of office visits required to check your device to one time per year. It allows [place] to keep an eye on the functioning of your device to ensure it is working properly. You are scheduled for a device check from home on [facility] 3RD. You may send your transmission at any time that day. If you have a wireless device, the transmission will be sent automatically. After your physician reviews your transmission, you will receive a postcard with your next transmission date.   Your physician recommends that you continue on your current medications as directed. Please refer to the Current Medication list given to you today.  Thank you for choosing [facility]!!

## 2014-08-29 NOTE — Assessment & Plan Note (Signed)
Her blood pressure is well controlled despite the pain from her right leg. She will continue her current meds. I asked her to try and lose some weight.

## 2014-08-30 ENCOUNTER — Encounter: Payer: Self-pay | Admitting: Internal Medicine

## 2014-08-30 LAB — PACEMAKER DEVICE OBSERVATION

## 2014-09-25 ENCOUNTER — Telehealth: Payer: Self-pay | Admitting: Family Medicine

## 2014-09-25 MED ORDER — PRAMIPEXOLE DIHYDROCHLORIDE 1 MG PO TABS: 1.5000 mg | ORAL_TABLET | Freq: Every day | ORAL | Status: DC

## 2014-09-25 NOTE — Telephone Encounter (Signed)
Med sent to pharm 

## 2014-09-25 NOTE — Telephone Encounter (Signed)
Patient needs refill on pramipexole if possible [place] if questions

## 2014-11-14 ENCOUNTER — Encounter: Payer: Self-pay | Admitting: Cardiovascular Disease

## 2014-11-14 ENCOUNTER — Ambulatory Visit (INDEPENDENT_AMBULATORY_CARE_PROVIDER_SITE_OTHER): Payer: Commercial Managed Care - HMO | Admitting: Cardiovascular Disease

## 2014-11-14 VITALS — BP 132/84 | HR 70 | Ht 62.0 in | Wt 208.0 lb

## 2014-11-14 DIAGNOSIS — N183 Chronic kidney disease, stage 3 unspecified: Secondary | ICD-10-CM

## 2014-11-14 DIAGNOSIS — Z8673 Personal history of transient ischemic attack (TIA), and cerebral infarction without residual deficits: Secondary | ICD-10-CM

## 2014-11-14 DIAGNOSIS — E785 Hyperlipidemia, unspecified: Secondary | ICD-10-CM

## 2014-11-14 DIAGNOSIS — I10 Essential (primary) hypertension: Secondary | ICD-10-CM

## 2014-11-14 DIAGNOSIS — I4729 Other ventricular tachycardia: Secondary | ICD-10-CM

## 2014-11-14 DIAGNOSIS — R0789 Other chest pain: Secondary | ICD-10-CM

## 2014-11-14 DIAGNOSIS — G4700 Insomnia, unspecified: Secondary | ICD-10-CM

## 2014-11-14 DIAGNOSIS — I472 Ventricular tachycardia: Secondary | ICD-10-CM

## 2014-11-14 DIAGNOSIS — I25768 Atherosclerosis of bypass graft of coronary artery of transplanted heart with other forms of angina pectoris: Secondary | ICD-10-CM

## 2014-11-14 DIAGNOSIS — J438 Other emphysema: Secondary | ICD-10-CM

## 2014-11-14 DIAGNOSIS — Z87891 Personal history of nicotine dependence: Secondary | ICD-10-CM

## 2014-11-14 DIAGNOSIS — Z45018 Encounter for adjustment and management of other part of cardiac pacemaker: Secondary | ICD-10-CM

## 2014-11-14 MED ORDER — ISOSORBIDE MONONITRATE ER 60 MG PO TB24: 60.0000 mg | ORAL_TABLET | Freq: Every day | ORAL | Status: DC

## 2014-11-14 NOTE — Addendum Note (Signed)
Addended by: Barbarann Ehlers A on: 11/14/2014 02:15 PM   Modules accepted: Level of Service

## 2014-11-14 NOTE — Progress Notes (Signed)
Patient ID: [name], female   DOB: [birth year], 69 y.o.   MRN: [id number]      SUBJECTIVE: The patient returns for routine follow up. Device interrogation on 08/29/14 demonstrated normal device function with no high ventricular rates or mode switches. She has a history of coronary artery bypass graft surgery, pacemaker, COPD, essential hypertension, hyperlipidemia, CKD and CVA. She had a normal nuclear myocardial perfusion study on 08/10/2013. She has chest pain about once a week but it has not been severe enough to use SL nitroglycerin that frequently.  She ran out of Xanax 1-2 weeks ago and has not been able to fall asleep. If she takes deep breaths, she tires out. She does not see her 2 adult sons often as they have families of their own. She lives alone in [place]. She is friends with [name]. ECG performed in the office today demonstrates atrial pacing and RSR prime pattern in V1.  Review of Systems: As per "subjective", otherwise negative.  Allergies  Allergen Reactions  . Codeine     swelling  . Iohexol      Desc: PT HAD CONTRAST REACTION YRS AGO NEEDS 13 HR PREP FOR ALL CONTRAST STUDIES     Current Outpatient Prescriptions  Medication Sig Dispense Refill  . albuterol (PROVENTIL HFA;VENTOLIN HFA) 108 (90 BASE) MCG/ACT inhaler Inhale 2 puffs into the lungs every 6 (six) hours as needed for wheezing or shortness of breath.    Marland Kitchen albuterol (PROVENTIL) (2.5 MG/3ML) 0.083% nebulizer solution Take 3 mLs (2.5 mg total) by nebulization every 4 (four) hours as needed for wheezing or shortness of breath. 150 mL 1  . aspirin 81 MG tablet Take 81 mg by mouth daily.    Marland Kitchen atorvastatin (LIPITOR) 20 MG tablet Take 20 mg by mouth daily.    . clopidogrel (PLAVIX) 75 MG tablet Take 1 tablet (75 mg total) by mouth daily with breakfast. 90 tablet 3  . furosemide (LASIX) 20 MG tablet Take 20 mg by mouth daily.    . isosorbide mononitrate (IMDUR) 30 MG 24 hr tablet Take 1 tablet (30  mg total) by mouth daily. 180 tablet 3  . levothyroxine (SYNTHROID, LEVOTHROID) 50 MCG tablet Take 50 mcg by mouth daily before breakfast.    . metoprolol (LOPRESSOR) 25 MG tablet Take 1 tablet (25 mg total) by mouth 2 (two) times daily. 60 tablet 3  . nitroGLYCERIN (NITROSTAT) 0.4 MG SL tablet Place 0.4 mg under the tongue every 5 (five) minutes as needed for chest pain.    . pantoprazole (PROTONIX) 40 MG tablet Take 40 mg by mouth daily.    . pramipexole (MIRAPEX) 1 MG tablet Take 1.5 tablets (1.5 mg total) by mouth daily. 135 tablet 3  . sertraline (ZOLOFT) 100 MG tablet Take 200 mg by mouth daily. 2 tabs at bedtime    . traZODone (DESYREL) 100 MG tablet Take 2 tablets (200 mg total) by mouth at bedtime. 60 tablet 2   No current facility-administered medications for this visit.    Past Medical History  Diagnosis Date  . Hypothyroid   . Stroke   . Syncope and collapse   . MI (myocardial infarction)     x2  . COPD (chronic obstructive pulmonary disease)   . Depression   . Insomnia   . Restless leg syndrome   . Coronary artery disease   . Bulging disc   . Chronic kidney disease, stage III (moderate)   . Macular degeneration   . Arteriosclerosis of  coronary artery bypass graft   . Tachycardia   . Pacemaker     [employer]  . Esophageal reflux   . Unspecified hearing loss   . Mixed hyperlipidemia   . PVT (paroxysmal ventricular tachycardia)   . Anxiety   . Blood transfusion without reported diagnosis     10/29/2006  . Cataract 05/2012  . Osteoporosis     Past Surgical History  Procedure Laterality Date  . Coronary artery bypass graft      x 4  . Cardiac catheterization  2007    x2;cone  . Cardiac catheterization  2008  . Cerebral angiogram    . Lumbar disc surgery    . Gallbladder surgery    . Partial hysterectomy    . Knee arthroscopy    . Cataract extraction Right   . Insert / replace / remove pacemaker  2013    [place], [place]; [employer]  .  Colonoscopy    . Eye surgery  05/2013    cataract removed  . Spine surgery  1999/2001/2006    3 disc replaced    History   Social History  . Marital Status: Married    Spouse Name: N/A  . Number of Children: N/A  . Years of Education: N/A   Occupational History  . Not on file.   Social History Main Topics  . Smoking status: Former Smoker -- 0.75 packs/day    Types: Cigarettes    Start date: 09/28/1965    Quit date: 05/27/2006  . Smokeless tobacco: Never Used  . Alcohol Use: No  . Drug Use: No  . Sexual Activity: Not Currently   Other Topics Concern  . Not on file   Social History Narrative     Filed Vitals:   11/14/14 1316  BP: 132/84  Pulse: 70  Height: 5\' 2"  (1.575 m)  Weight: 208 lb (94.348 kg)  SpO2: 96%    PHYSICAL EXAM General: NAD HEENT: Normal. Neck: No JVD, no thyromegaly. Lungs: Clear to auscultation bilaterally with normal respiratory effort. CV: Nondisplaced PMI.  Regular rate and rhythm, normal S1/S2, no S3/S4, no murmur. Trivial periankle edema.    Abdomen: Soft, nontender, obese.  Neurologic: Alert and oriented x 3.  Psych: Mildly flat affect. Skin: Normal. Musculoskeletal: No gross deformities. Extremities: No clubbing or cyanosis.   ECG: Most recent ECG reviewed.      ASSESSMENT AND PLAN: 1. CAD s/p CABG: Stable ischemic heart disease. Nuclear myocardial perfusion study normal in November 2014. I previously increased Imdur to 60 mg daily but it did not provide any relief. However, she would like to try it again. I have asked her to call back in a few weeks and if she is still having chest pain, I will increase to 90 mg. I will continue aspirin, metoprolol, and statin therapy. 2. Essential HTN: Well controlled on current therapy. No changes. 3. Hyperlipidemia: HDL 83, LDL 68 on 05/30/14 . Continue low intensity statin therapy with Lipitor 20 mg daily. 4. CVA: She is on both aspirin and Plavix. Stable, no changes. 5. COPD: Continue  therapy with albuterol inhaler as needed. She quit smoking 9 or 10 years ago. 6. Pacemaker: Device interrogation as noted above with no mode switches or high ventricular rates. I will continue metoprolol 25 mg twice daily.  7. CKD stage 3: BUN 22, creat 1.42, GFR 38 ml/min on 07/05/14.  8. Insomnia: Will refill Xanax 1 mg qhs prn with a 3 month supply and 3 refills.  Dispo: f/u 6 months.  Kate Sable, M.D., F.A.C.C.

## 2014-11-14 NOTE — Patient Instructions (Addendum)
Your physician wants you to follow-up in: 4 months with Dr Virgina Jock will receive a reminder letter in the mail two months in advance. If you don't receive a letter, please call our office to schedule the follow-up appointment.  INCREASE Imdur to 60 mg dailt   Take Xanax as directed    Thank you for choosing [facility] !

## 2014-11-15 ENCOUNTER — Telehealth: Payer: Self-pay | Admitting: Family Medicine

## 2014-11-15 NOTE — Telephone Encounter (Signed)
Daughter in law states pt had short episode of nausea/vomiting/diarrhea this morning.  It has not continued.  Pt states feeling better and is tolerating oral intake.  Told family member to continue to watch.  If does not reoccur pt most likely will be fine.  If does come back would go to UC or ED to monitor her for dehydration and heart.

## 2014-11-15 NOTE — Telephone Encounter (Signed)
Patients daughter in law calling with some concerns about some symptoms she is having after going to the cardiologists yesterday  Her name is shay  [phone]

## 2014-11-15 NOTE — Telephone Encounter (Signed)
Agreed.  Likely viral.  She could take phenergan 12.5 mg poq 6 hrs prn nausea and vomiting.

## 2014-11-27 ENCOUNTER — Telehealth: Payer: Self-pay | Admitting: *Deleted

## 2014-11-27 NOTE — Telephone Encounter (Signed)
Submitted [employer] referral thru acuity connect for authorization to Dr. Cristopher Peru, MD Cardiologist with authorization number [id number]   Requesting provider: Cammie [name]. [name] Schaumann, MD  Treating provider: Cristopher Peru, MD  PCP: [name]. [name] Schaumann, MD  Number of visits:6  Start Date: 11/28/14-05/27/15  Dx: I47.1-Supraventicular tachycardia       I49.5-Sick sinus syndrome       I10- Essential primary hypertension       I25.2- old myocardial infarction       Z45.018-Encounter for adjust and mgmt oth prt cardiac pacemaker  Copy has been faxed to [facility] for records/review

## 2014-11-28 ENCOUNTER — Telehealth: Payer: Self-pay | Admitting: Cardiology

## 2014-11-28 ENCOUNTER — Ambulatory Visit (INDEPENDENT_AMBULATORY_CARE_PROVIDER_SITE_OTHER): Payer: Commercial Managed Care - HMO | Admitting: *Deleted

## 2014-11-28 DIAGNOSIS — I495 Sick sinus syndrome: Secondary | ICD-10-CM

## 2014-11-28 LAB — MDC_IDC_ENUM_SESS_TYPE_REMOTE
Battery Remaining Longevity: 78 mo
Battery Remaining Percentage: 100 %
Brady Statistic RV Percent Paced: 15 %
Date Time Interrogation Session: 20160303174500
Implantable Pulse Generator Serial Number: 131914
Lead Channel Pacing Threshold Amplitude: 0.5 V
Lead Channel Pacing Threshold Pulse Width: 0.4 ms
Lead Channel Setting Pacing Amplitude: 2.4 V
Lead Channel Setting Sensing Sensitivity: 2.5 mV
MDC IDC MSMT LEADCHNL RA IMPEDANCE VALUE: 565 Ohm
MDC IDC MSMT LEADCHNL RV IMPEDANCE VALUE: 464 Ohm
MDC IDC SET LEADCHNL RA PACING AMPLITUDE: 2 V
MDC IDC SET LEADCHNL RV PACING PULSEWIDTH: 0.4 ms
MDC IDC SET ZONE DETECTION INTERVAL: 400 ms
MDC IDC STAT BRADY RA PERCENT PACED: 99 %

## 2014-11-28 NOTE — Telephone Encounter (Signed)
Spoke with pt and reminded pt of remote transmission that is due today. Pt verbalized understanding.   

## 2014-11-28 NOTE — Progress Notes (Signed)
Remote pacemaker transmission.   

## 2014-12-03 ENCOUNTER — Other Ambulatory Visit: Payer: Self-pay | Admitting: Family Medicine

## 2014-12-03 NOTE — Telephone Encounter (Signed)
?   OK to Refill  

## 2014-12-09 ENCOUNTER — Telehealth: Payer: Self-pay | Admitting: Cardiovascular Disease

## 2014-12-09 NOTE — Telephone Encounter (Signed)
Please see refill bin / tg  °

## 2014-12-10 NOTE — Telephone Encounter (Signed)
Needed clarification of Lopressor dose dose is 25 mg bid

## 2014-12-11 ENCOUNTER — Other Ambulatory Visit: Payer: Self-pay | Admitting: Cardiovascular Disease

## 2014-12-11 NOTE — Telephone Encounter (Signed)
Clarification order on Metoprolol tart 25mg   Signed and faxed to [facility] on 12/10/14

## 2014-12-11 NOTE — Telephone Encounter (Signed)
Please see refill bin / tg  °

## 2014-12-13 ENCOUNTER — Encounter: Payer: Self-pay | Admitting: Family Medicine

## 2014-12-13 ENCOUNTER — Ambulatory Visit (INDEPENDENT_AMBULATORY_CARE_PROVIDER_SITE_OTHER): Payer: Commercial Managed Care - HMO | Admitting: Family Medicine

## 2014-12-13 VITALS — BP 128/74 | HR 80 | Temp 97.5°F | Resp 22 | Ht 62.0 in | Wt 204.0 lb

## 2014-12-13 DIAGNOSIS — K529 Noninfective gastroenteritis and colitis, unspecified: Secondary | ICD-10-CM | POA: Diagnosis not present

## 2014-12-13 LAB — CBC WITH DIFFERENTIAL/PLATELET
BASOS PCT: 0 % (ref 0–1)
Basophils Absolute: 0 10*3/uL (ref 0.0–0.1)
Eosinophils Absolute: 0.1 10*3/uL (ref 0.0–0.7)
Eosinophils Relative: 2 % (ref 0–5)
HEMATOCRIT: 40.7 % (ref 36.0–46.0)
Hemoglobin: 13.1 g/dL (ref 12.0–15.0)
LYMPHS PCT: 28 % (ref 12–46)
Lymphs Abs: 2 10*3/uL (ref 0.7–4.0)
MCH: 26.7 pg (ref 26.0–34.0)
MCHC: 32.2 g/dL (ref 30.0–36.0)
MCV: 82.9 fL (ref 78.0–100.0)
MONO ABS: 0.8 10*3/uL (ref 0.1–1.0)
MONOS PCT: 11 % (ref 3–12)
MPV: 8.3 fL — ABNORMAL LOW (ref 8.6–12.4)
NEUTROS ABS: 4.1 10*3/uL (ref 1.7–7.7)
NEUTROS PCT: 59 % (ref 43–77)
Platelets: 152 10*3/uL (ref 150–400)
RBC: 4.91 MIL/uL (ref 3.87–5.11)
RDW: 13.9 % (ref 11.5–15.5)
WBC: 7 10*3/uL (ref 4.0–10.5)

## 2014-12-13 LAB — COMPLETE METABOLIC PANEL WITH GFR
ALBUMIN: 4 g/dL (ref 3.5–5.2)
ALT: 14 U/L (ref 0–35)
AST: 15 U/L (ref 0–37)
Alkaline Phosphatase: 119 U/L — ABNORMAL HIGH (ref 39–117)
BUN: 20 mg/dL (ref 6–23)
CALCIUM: 8.9 mg/dL (ref 8.4–10.5)
CO2: 22 meq/L (ref 19–32)
Chloride: 105 mEq/L (ref 96–112)
Creat: 1.24 mg/dL — ABNORMAL HIGH (ref 0.50–1.10)
GFR, EST AFRICAN AMERICAN: 51 mL/min — AB
GFR, EST NON AFRICAN AMERICAN: 44 mL/min — AB
GLUCOSE: 126 mg/dL — AB (ref 70–99)
POTASSIUM: 4.8 meq/L (ref 3.5–5.3)
Sodium: 139 mEq/L (ref 135–145)
Total Bilirubin: 0.3 mg/dL (ref 0.2–1.2)
Total Protein: 7.1 g/dL (ref 6.0–8.3)

## 2014-12-13 LAB — TSH: TSH: 2.997 u[IU]/mL (ref 0.350–4.500)

## 2014-12-13 MED ORDER — PANTOPRAZOLE SODIUM 40 MG PO TBEC: 40.0000 mg | DELAYED_RELEASE_TABLET | Freq: Every day | ORAL | Status: DC

## 2014-12-13 NOTE — Progress Notes (Signed)
Subjective:    Patient ID: [name], female    DOB: 01-07-1946, 69 y.o.   MRN: [id number]  HPI Patient presents today with chronic diarrhea. She states that her symptoms have been going on now for the last 6-8 months. Several times a week she will have days where she will have frequent watery diarrhea. She denies any fevers. She denies any weight loss. She denies any melena. She denies any hematochezia. She denies any severe abdominal pain. The diarrhea is so unpredictable that she is afraid to leave her home. She has a history of irritable bowel syndrome and she attributed to irritable bowel syndrome. She denies any other concerning constitutional symptoms. However her family history is significant for a sister with Crohn's disease and another sister with colitis. She is also complaining that her restless leg syndrome is worsening. The frequency and severity of the symptoms has increased. It is so severe that at times it is causing panic attacks. Past Medical History  Diagnosis Date  . Hypothyroid   . Stroke   . Syncope and collapse   . MI (myocardial infarction)     x2  . COPD (chronic obstructive pulmonary disease)   . Depression   . Insomnia   . Restless leg syndrome   . Coronary artery disease   . Bulging disc   . Chronic kidney disease, stage III (moderate)   . Macular degeneration   . Arteriosclerosis of coronary artery bypass graft   . Tachycardia   . Pacemaker     [employer]  . Esophageal reflux   . Unspecified hearing loss   . Mixed hyperlipidemia   . PVT (paroxysmal ventricular tachycardia)   . Anxiety   . Blood transfusion without reported diagnosis     10/29/2006  . Cataract 05/2012  . Osteoporosis    Past Surgical History  Procedure Laterality Date  . Coronary artery bypass graft      x 4  . Cardiac catheterization  2007    x2;cone  . Cardiac catheterization  2008  . Cerebral angiogram    . Lumbar disc surgery    . Gallbladder surgery    .  Partial hysterectomy    . Knee arthroscopy    . Cataract extraction Right   . Insert / replace / remove pacemaker  2013    [place], [place]; [employer]  . Colonoscopy    . Eye surgery  05/2013    cataract removed  . Spine surgery  1999/2001/2006    3 disc replaced   Current Outpatient Prescriptions on File Prior to Visit  Medication Sig Dispense Refill  . albuterol (PROVENTIL HFA;VENTOLIN HFA) 108 (90 BASE) MCG/ACT inhaler Inhale 2 puffs into the lungs every 6 (six) hours as needed for wheezing or shortness of breath.    Marland Kitchen albuterol (PROVENTIL) (2.5 MG/3ML) 0.083% nebulizer solution Take 3 mLs (2.5 mg total) by nebulization every 4 (four) hours as needed for wheezing or shortness of breath. 150 mL 1  . aspirin 81 MG tablet Take 81 mg by mouth daily.    Marland Kitchen atorvastatin (LIPITOR) 20 MG tablet Take 20 mg by mouth daily.    . clopidogrel (PLAVIX) 75 MG tablet Take 1 tablet (75 mg total) by mouth daily with breakfast. 90 tablet 3  . isosorbide mononitrate (IMDUR) 60 MG 24 hr tablet Take 1 tablet (60 mg total) by mouth daily. 90 tablet 3  . levothyroxine (SYNTHROID, LEVOTHROID) 50 MCG tablet Take 50 mcg by mouth daily before  breakfast.    . metoprolol (LOPRESSOR) 25 MG tablet Take 1 tablet (25 mg total) by mouth 2 (two) times daily. 60 tablet 3  . nitroGLYCERIN (NITROSTAT) 0.4 MG SL tablet Place 0.4 mg under the tongue every 5 (five) minutes as needed for chest pain.    . pramipexole (MIRAPEX) 1 MG tablet Take 1.5 tablets (1.5 mg total) by mouth daily. 135 tablet 3  . sertraline (ZOLOFT) 100 MG tablet Take 200 mg by mouth daily. 2 tabs at bedtime    . traZODone (DESYREL) 100 MG tablet TAKE 2 TABLETS AT BEDTIME. 60 tablet 2  . furosemide (LASIX) 20 MG tablet Take 20 mg by mouth daily.     No current facility-administered medications on file prior to visit.   Allergies  Allergen Reactions  . Codeine     swelling  . Iohexol      Desc: PT HAD CONTRAST REACTION YRS AGO NEEDS 13 HR PREP  FOR ALL CONTRAST STUDIES    History   Social History  . Marital Status: Married    Spouse Name: N/A  . Number of Children: N/A  . Years of Education: N/A   Occupational History  . Not on file.   Social History Main Topics  . Smoking status: Former Smoker -- 0.75 packs/day    Types: Cigarettes    Start date: 09/28/1965    Quit date: 05/27/2006  . Smokeless tobacco: Never Used  . Alcohol Use: No  . Drug Use: No  . Sexual Activity: Not Currently   Other Topics Concern  . Not on file   Social History Narrative      Review of Systems  All other systems reviewed and are negative.      Objective:   Physical Exam  Cardiovascular: Normal rate and regular rhythm.   Pulmonary/Chest: Effort normal and breath sounds normal.  Abdominal: Soft. Bowel sounds are normal. She exhibits no distension. There is no tenderness. There is no rebound and no guarding.  Musculoskeletal: She exhibits edema.  Vitals reviewed.         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 I will start the patient on Viberzi 100 mg pobid and I gave the patient samples for the next 2 weeks. I will also check a sedimentation rate as well as a CBC and CMP to evaluate for signs of possible more severe underlying illness. If the labs are normal and her symptoms improve I feel that no further workup will be necessary. If her symptoms persist I would want her to see a gastroenterologist for colonoscopy

## 2014-12-14 LAB — SEDIMENTATION RATE: Sed Rate: 5 mm/hr (ref 0–30)

## 2014-12-18 ENCOUNTER — Telehealth: Payer: Self-pay | Admitting: Family Medicine

## 2014-12-18 NOTE — Telephone Encounter (Signed)
Dr Dennard Schaumann gave her samples of viberzi last week and she would like to discuss this med with a nurse if possible  She said it is working too good? [phone]

## 2014-12-20 NOTE — Telephone Encounter (Signed)
Pt wanted to let you know after she got home last Friday.  That night had two hour bout of nausea and vomiting.  Has had none since.  Did take the [place] and it has worked well.

## 2014-12-23 NOTE — Telephone Encounter (Signed)
Ok to continue viberzi for ibs.

## 2015-01-01 ENCOUNTER — Encounter: Payer: Self-pay | Admitting: Cardiology

## 2015-01-07 ENCOUNTER — Encounter: Payer: Self-pay | Admitting: Internal Medicine

## 2015-01-07 ENCOUNTER — Telehealth: Payer: Self-pay | Admitting: Family Medicine

## 2015-01-07 NOTE — Telephone Encounter (Signed)
Can she get a lis of what her insurance will cover for IBS diarrhea prdominant?

## 2015-01-07 NOTE — Telephone Encounter (Signed)
[employer] called her and said $5800.00 for 90 days.  What do you want her to do?  They were worked really well.  Took bid for 4 days then didn't pass bowels for several days and was very uncomfortable.  So took one at bedtime and had BM next day.  So took the rest at the once at bedtime dose and worked great.  Can not afford!!!

## 2015-01-07 NOTE — Telephone Encounter (Signed)
Patient is calling to speak with you regarding her verderzi she is out of samples and and 3 month supply is going to cost her 5800.00, please call her back at [phone]

## 2015-01-08 NOTE — Telephone Encounter (Signed)
Spoke with provider.  Per Provider there are really no other alternatives that are covered by her insurance.  Can use OTC Imodium as directed.  Offer GI referral.  Spoke to pt and made aware of provider recommendations.  She declines GI referral at this time.

## 2015-01-14 ENCOUNTER — Telehealth: Payer: Self-pay | Admitting: *Deleted

## 2015-01-14 NOTE — Telephone Encounter (Signed)
Received fax from [facility] care management with authorization to Dr. Adair Laundry with authorization number [id number]  Requesting provider: Adair Laundry  Treating provider: Adair Laundry  Place of service: Physicians office  PCP: Flonnie Hailstone  Number of visits: 6  Start date: 01/16/15  End date: 07/15/15  Dx:I12.9-Hypertensive chronic kidney disease w stg 1-4 unsp chr kdny  Copy has been sent to above provider

## 2015-02-26 ENCOUNTER — Telehealth: Payer: Self-pay | Admitting: Family Medicine

## 2015-02-26 NOTE — Telephone Encounter (Signed)
Patient calling for refill of trazodone [phone]

## 2015-02-26 NOTE — Telephone Encounter (Signed)
?   OK to Refill  

## 2015-02-27 MED ORDER — TRAZODONE HCL 100 MG PO TABS: ORAL_TABLET | ORAL | Status: DC

## 2015-02-27 NOTE — Telephone Encounter (Signed)
ok 

## 2015-02-27 NOTE — Telephone Encounter (Signed)
Medication called/sent to requested pharmacy  

## 2015-03-03 ENCOUNTER — Ambulatory Visit (INDEPENDENT_AMBULATORY_CARE_PROVIDER_SITE_OTHER): Payer: Commercial Managed Care - HMO | Admitting: *Deleted

## 2015-03-03 ENCOUNTER — Telehealth: Payer: Self-pay | Admitting: Cardiology

## 2015-03-03 ENCOUNTER — Encounter: Payer: Commercial Managed Care - HMO | Admitting: *Deleted

## 2015-03-03 ENCOUNTER — Telehealth: Payer: Self-pay | Admitting: *Deleted

## 2015-03-03 DIAGNOSIS — I495 Sick sinus syndrome: Secondary | ICD-10-CM | POA: Diagnosis not present

## 2015-03-03 NOTE — Telephone Encounter (Signed)
LMOVM reminding pt to send remote transmission.   

## 2015-03-03 NOTE — Telephone Encounter (Signed)
Submitted [employer] referral thru acuity connect for authorization on 02/27/15 to Dr. London Pepper with authorization number [id number]  Requesting provider: Flonnie Hailstone  Treating provider: London Pepper  Number of visits:1  Start Date: 03/19/15  End Date: 03/19/15  Dx:I47.1-supraventrial tachycardia      I49.5-sick sinus syndrome      I10- Essential Hypertension\     S91.980-ICHTVGVSY for adjust adn mgmt oth prt cardiac pacemaker  Copy has been faxed to Dr. Garner Nash office for review

## 2015-03-05 ENCOUNTER — Encounter: Payer: Self-pay | Admitting: Cardiology

## 2015-03-10 ENCOUNTER — Telehealth: Payer: Self-pay | Admitting: Internal Medicine

## 2015-03-10 NOTE — Telephone Encounter (Signed)
New Message       Pt calling stating that she received a letter stating that we didn't receive her remote transmission. Please call back and advise.

## 2015-03-10 NOTE — Telephone Encounter (Signed)
Informed pt that we did receive transmission and she can disregard transmission.

## 2015-03-11 ENCOUNTER — Telehealth: Payer: Self-pay | Admitting: Family Medicine

## 2015-03-11 NOTE — Telephone Encounter (Signed)
Patient calling to say that [facility] will not refill her sertraline, levothyroxine, and her atorvastatin without a new rx from dr pickard would like a call back about this  [phone]

## 2015-03-11 NOTE — Progress Notes (Signed)
Remote pacemaker transmission.   

## 2015-03-12 MED ORDER — LEVOTHYROXINE SODIUM 50 MCG PO TABS
50.0000 ug | ORAL_TABLET | Freq: Every day | ORAL | Status: DC
Start: 2015-03-12 — End: 2015-12-03

## 2015-03-12 MED ORDER — SERTRALINE HCL 100 MG PO TABS: 200.0000 mg | ORAL_TABLET | Freq: Every day | ORAL | Status: DC

## 2015-03-12 MED ORDER — ATORVASTATIN CALCIUM 20 MG PO TABS: 20.0000 mg | ORAL_TABLET | Freq: Every day | ORAL | Status: DC

## 2015-03-12 NOTE — Telephone Encounter (Signed)
Medication called/sent to requested pharmacy  

## 2015-03-15 LAB — CUP PACEART REMOTE DEVICE CHECK
Battery Remaining Longevity: 72 mo
Battery Remaining Percentage: 100 %
Brady Statistic RA Percent Paced: 99 %
Brady Statistic RV Percent Paced: 17 %
Date Time Interrogation Session: 20160606155500
Lead Channel Impedance Value: 437 Ohm
Lead Channel Impedance Value: 552 Ohm
Lead Channel Pacing Threshold Amplitude: 0.5 V
Lead Channel Pacing Threshold Pulse Width: 0.4 ms
Lead Channel Sensing Intrinsic Amplitude: 5.4 mV
Lead Channel Setting Pacing Amplitude: 2 V
Lead Channel Setting Pacing Amplitude: 2.4 V
Lead Channel Setting Pacing Pulse Width: 0.4 ms
Lead Channel Setting Sensing Sensitivity: 2.5 mV
Pulse Gen Serial Number: 131914
Zone Setting Detection Interval: 400 ms

## 2015-03-19 ENCOUNTER — Encounter: Payer: Self-pay | Admitting: Cardiology

## 2015-03-19 ENCOUNTER — Ambulatory Visit (INDEPENDENT_AMBULATORY_CARE_PROVIDER_SITE_OTHER): Payer: Commercial Managed Care - HMO | Admitting: Cardiovascular Disease

## 2015-03-19 ENCOUNTER — Encounter: Payer: Self-pay | Admitting: Cardiovascular Disease

## 2015-03-19 ENCOUNTER — Telehealth: Payer: Self-pay | Admitting: Family Medicine

## 2015-03-19 VITALS — BP 126/80 | HR 78 | Ht 62.0 in | Wt 209.0 lb

## 2015-03-19 DIAGNOSIS — I25812 Atherosclerosis of bypass graft of coronary artery of transplanted heart without angina pectoris: Secondary | ICD-10-CM

## 2015-03-19 DIAGNOSIS — R600 Localized edema: Secondary | ICD-10-CM | POA: Diagnosis not present

## 2015-03-19 DIAGNOSIS — I10 Essential (primary) hypertension: Secondary | ICD-10-CM | POA: Diagnosis not present

## 2015-03-19 DIAGNOSIS — E785 Hyperlipidemia, unspecified: Secondary | ICD-10-CM | POA: Diagnosis not present

## 2015-03-19 DIAGNOSIS — I495 Sick sinus syndrome: Secondary | ICD-10-CM | POA: Diagnosis not present

## 2015-03-19 DIAGNOSIS — Z8673 Personal history of transient ischemic attack (TIA), and cerebral infarction without residual deficits: Secondary | ICD-10-CM

## 2015-03-19 DIAGNOSIS — J438 Other emphysema: Secondary | ICD-10-CM

## 2015-03-19 DIAGNOSIS — N189 Chronic kidney disease, unspecified: Secondary | ICD-10-CM

## 2015-03-19 DIAGNOSIS — Z45018 Encounter for adjustment and management of other part of cardiac pacemaker: Secondary | ICD-10-CM

## 2015-03-19 MED ORDER — FUROSEMIDE 20 MG PO TABS: ORAL_TABLET | ORAL | Status: DC

## 2015-03-19 NOTE — Addendum Note (Signed)
Addended by: Debbora Lacrosse R on: 03/19/2015 02:07 PM   Modules accepted: Orders, Level of Service

## 2015-03-19 NOTE — Progress Notes (Signed)
Patient ID: [name], female   DOB: 1945/11/11, 69 y.o.   MRN: [id number]      SUBJECTIVE: The patient returns for routine follow up. Device interrogation on 03/03/15 demonstrated normal device function with no high ventricular rates or mode switches, with one 4-beat run of NSVT. She has a history of coronary artery bypass graft surgery, pacemaker, COPD, essential hypertension, hyperlipidemia, CKD and CVA. She had a normal nuclear myocardial perfusion study on 08/10/2013.  She denies chest pain. She has been bothered by leg swelling. She is frustrated by her health insurance company.  Soc: She does not see her 2 adult sons often as they have families of their own. She lives alone in [place]. She is friends with [name].   Review of Systems: As per "subjective", otherwise negative.  Allergies  Allergen Reactions  . Codeine     swelling  . Iohexol      Desc: PT HAD CONTRAST REACTION YRS AGO NEEDS 13 HR PREP FOR ALL CONTRAST STUDIES     Current Outpatient Prescriptions  Medication Sig Dispense Refill  . albuterol (PROVENTIL HFA;VENTOLIN HFA) 108 (90 BASE) MCG/ACT inhaler Inhale 2 puffs into the lungs every 6 (six) hours as needed for wheezing or shortness of breath.    Marland Kitchen albuterol (PROVENTIL) (2.5 MG/3ML) 0.083% nebulizer solution Take 3 mLs (2.5 mg total) by nebulization every 4 (four) hours as needed for wheezing or shortness of breath. 150 mL 1  . aspirin 81 MG tablet Take 81 mg by mouth daily.    Marland Kitchen atorvastatin (LIPITOR) 20 MG tablet Take 1 tablet (20 mg total) by mouth daily. 90 tablet 3  . clopidogrel (PLAVIX) 75 MG tablet Take 1 tablet (75 mg total) by mouth daily with breakfast. 90 tablet 3  . furosemide (LASIX) 20 MG tablet Take 20 mg by mouth daily.    . isosorbide mononitrate (IMDUR) 60 MG 24 hr tablet Take 1 tablet (60 mg total) by mouth daily. 90 tablet 3  . levothyroxine (SYNTHROID, LEVOTHROID) 50 MCG tablet Take 1 tablet (50 mcg total) by mouth daily  before breakfast. 90 tablet 3  . metoprolol (LOPRESSOR) 50 MG tablet Take 50 mg by mouth 2 (two) times daily.    . nitroGLYCERIN (NITROSTAT) 0.4 MG SL tablet Place 0.4 mg under the tongue every 5 (five) minutes as needed for chest pain.    . pantoprazole (PROTONIX) 40 MG tablet Take 1 tablet (40 mg total) by mouth daily. 90 tablet 3  . pramipexole (MIRAPEX) 1 MG tablet Take 1.5 tablets (1.5 mg total) by mouth daily. 135 tablet 3  . sertraline (ZOLOFT) 100 MG tablet Take 2 tablets (200 mg total) by mouth daily. 2 tabs at bedtime 180 tablet 3  . traZODone (DESYREL) 100 MG tablet TAKE 2 TABLETS AT BEDTIME. 60 tablet 2  . Vitamin D, Ergocalciferol, (DRISDOL) 50000 UNITS CAPS capsule take 1 capsule by mouth every week FOR FOUR WEEKS, THEN ONE CAPSULE ONCE A MONTH FOR 6 MONTHS  0   No current facility-administered medications for this visit.    Past Medical History  Diagnosis Date  . Hypothyroid   . Stroke   . Syncope and collapse   . MI (myocardial infarction)     x2  . COPD (chronic obstructive pulmonary disease)   . Depression   . Insomnia   . Restless leg syndrome   . Coronary artery disease   . Bulging disc   . Chronic kidney disease, stage III (moderate)   . Macular  degeneration   . Arteriosclerosis of coronary artery bypass graft   . Tachycardia   . Pacemaker     [employer]  . Esophageal reflux   . Unspecified hearing loss   . Mixed hyperlipidemia   . PVT (paroxysmal ventricular tachycardia)   . Anxiety   . Blood transfusion without reported diagnosis     10/29/2006  . Cataract 05/2012  . Osteoporosis     Past Surgical History  Procedure Laterality Date  . Coronary artery bypass graft      x 4  . Cardiac catheterization  2007    x2;cone  . Cardiac catheterization  2008  . Cerebral angiogram    . Lumbar disc surgery    . Gallbladder surgery    . Partial hysterectomy    . Knee arthroscopy    . Cataract extraction Right   . Insert / replace / remove  pacemaker  2013    [place], [place]; [employer]  . Colonoscopy    . Eye surgery  05/2013    cataract removed  . Spine surgery  1999/2001/2006    3 disc replaced    History   Social History  . Marital Status: Married    Spouse Name: N/A  . Number of Children: N/A  . Years of Education: N/A   Occupational History  . Not on file.   Social History Main Topics  . Smoking status: Former Smoker -- 0.75 packs/day    Types: Cigarettes    Start date: 09/28/1965    Quit date: 05/27/2006  . Smokeless tobacco: Never Used  . Alcohol Use: No  . Drug Use: No  . Sexual Activity: Not Currently   Other Topics Concern  . Not on file   Social History Narrative     Filed Vitals:   03/19/15 1326  BP: 126/80  Pulse: 78  Height: 5\' 2"  (1.575 m)  Weight: 209 lb (94.802 kg)  SpO2: 97%    PHYSICAL EXAM General: NAD HEENT: Normal. Neck: No JVD, no thyromegaly. Lungs: Clear to auscultation bilaterally with normal respiratory effort. CV: Nondisplaced PMI. Regular rate and rhythm, normal S1/S2, no S3/S4, no murmur. 1+ pitting pretibial and periankle edema.  Abdomen: Soft, nontender, obese.  Neurologic: Alert and oriented x 3.  Psych: Mildly flat affect. Skin: Normal. Musculoskeletal: No gross deformities. Extremities: No clubbing or cyanosis.   ECG: Most recent ECG reviewed.      ASSESSMENT AND PLAN: 1. CAD s/p CABG: Stable ischemic heart disease. Nuclear myocardial perfusion study normal in November 2014. Continue aspirin, metoprolol, Imdur, and statin therapy.  2. Essential HTN: Well controlled on current therapy. No changes.  3. Hyperlipidemia: HDL 83, LDL 68 on 05/30/14 . Continue low intensity statin therapy with Lipitor 20 mg daily.  4. CVA: She is on both aspirin and Plavix. Stable, no changes.  5. COPD: Continue therapy with albuterol inhaler as needed. She quit smoking 9 or 10 years ago.  6. Pacemaker: Device interrogation as noted above with no mode  switches or high ventricular rates. I will continue metoprolol 25 mg twice daily.   7. CKD stage 3: BUN 20, creat 1.24 on 12/13/14.   8. Bilateral lower extremity edema: Will increase Lasix to 40 mg and 20 mg on alternate days.   Dispo: f/u 6 months.    Kate Sable, M.D., F.A.C.C.

## 2015-03-19 NOTE — Patient Instructions (Signed)
Your physician wants you to follow-up in: 6 months with Dr. Bronson Ing. You will receive a reminder letter in the mail two months in advance. If you don't receive a letter, please call our office to schedule the follow-up appointment.  Start taking your lasix on an alternating basis 20 MG / 40 MG (EXAMPLE- IF YOU TAKE 20 MG TODAY THEN TOMORROW YOU TAKE 40MG  )  Thanks for choosing [facility]!!!

## 2015-03-19 NOTE — Telephone Encounter (Signed)
Patient would like to speak to you regarding an authorization to her heart doc and her referral  [phone]

## 2015-03-20 ENCOUNTER — Telehealth: Payer: Self-pay | Admitting: *Deleted

## 2015-03-20 NOTE — Telephone Encounter (Signed)
Submitted humana referral thru acuity connect for authorization on 03/18/15 to Dr. Nilda Riggs with authorization [id number]  Requesting provider:Warren Pickard,MD  Treating provider:Suresh Koneswaren,MD  Number of visits:1  Start Date:03/19/15  End Date:03/19/15  Dx:I47.1- supraventricular tachycardia      I49.5-Sick sinus syndrome      I10-essential (primary)hypertension      I25.2-old myocardial infraction     Z45.018-Encounter for adjust adn mgmt oth prt cardiac pacemaker  Copy has been faxed to Cardiology for review/record

## 2015-03-21 NOTE — Telephone Encounter (Signed)
Called pt this am and stated she is needing a referral for when she last saw cardiologist DR. Harriet Butte on 03/19/15, I submitted [employer] referral with is pending

## 2015-03-24 ENCOUNTER — Encounter: Payer: Self-pay | Admitting: Internal Medicine

## 2015-03-25 ENCOUNTER — Telehealth: Payer: Self-pay | Admitting: Family Medicine

## 2015-03-25 NOTE — Telephone Encounter (Signed)
[phone] Pt has called wanting to speak to you because this Dr she was seeing in [place] wrote a letter to her apartment complex saying she needed to be in assisted living. She can not afford assisted living with out the help from the [place]. She states that she has a bunch of papers from the [place] she has got most of them filled out and one of the sheets needs to be filled out by someone here and she wants to speak to you in regards to that. Pt also states that simple things are getting harder for her, she lives alone and states that she goes days without seeing or talking to people.

## 2015-03-26 NOTE — Telephone Encounter (Signed)
Spoke to pt and she is needing some paperwork filled out for assisted living and the va is helping her - I scheduled her an appt as she has not been here since oct 2015 for her 1st initial visit here and has not been back for f/u. Informed her that [facility] could exam her and paperwork would be filled out at a later time. I scheduled this for a 30 minute slot to allow for any other testing she may need for this paperwork.

## 2015-03-27 NOTE — Patient Outreach (Signed)
[facility]) Care Management  03/27/2015  [name] 20-May-1946 [id number]   Referral received from [facility] and assigned to Sherrin Daisy, RN for patient outreach.   [name] L. [facility] Care Management Assistant [phone]

## 2015-03-28 ENCOUNTER — Encounter: Payer: Self-pay | Admitting: *Deleted

## 2015-04-08 ENCOUNTER — Telehealth: Payer: Self-pay | Admitting: *Deleted

## 2015-04-08 ENCOUNTER — Encounter: Payer: Self-pay | Admitting: Family Medicine

## 2015-04-08 ENCOUNTER — Ambulatory Visit (INDEPENDENT_AMBULATORY_CARE_PROVIDER_SITE_OTHER): Payer: Commercial Managed Care - HMO | Admitting: Family Medicine

## 2015-04-08 VITALS — BP 128/80 | HR 80 | Temp 97.6°F | Resp 18 | Ht 62.0 in | Wt 214.0 lb

## 2015-04-08 DIAGNOSIS — I2510 Atherosclerotic heart disease of native coronary artery without angina pectoris: Secondary | ICD-10-CM

## 2015-04-08 DIAGNOSIS — J438 Other emphysema: Secondary | ICD-10-CM | POA: Diagnosis not present

## 2015-04-08 DIAGNOSIS — R531 Weakness: Secondary | ICD-10-CM | POA: Diagnosis not present
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
## 2015-04-08 NOTE — Telephone Encounter (Signed)
Received call from patient.   Reports that she requires authorization through [facility] for pacemaker check.   Reports that she has appointment on 06/09/2015 at [facility] in [place].   Please contact patient when authorization completed.

## 2015-04-10 ENCOUNTER — Other Ambulatory Visit: Payer: Self-pay | Admitting: *Deleted

## 2015-04-10 DIAGNOSIS — J449 Chronic obstructive pulmonary disease, unspecified: Secondary | ICD-10-CM

## 2015-04-10 DIAGNOSIS — I519 Heart disease, unspecified: Secondary | ICD-10-CM

## 2015-04-10 DIAGNOSIS — K589 Irritable bowel syndrome without diarrhea: Secondary | ICD-10-CM

## 2015-04-10 NOTE — Patient Outreach (Signed)
[facility]) Care Management  04/10/2015  TATUM CORL [birth year] [id number]  [facility]/MD referral:   Telephone call to patient; HIPPA verification received from patient.  Patient voices that she currently is concerned about trying to complete Veteran';s  Administration forms to see if she is a qualifying spouse for  funding for assisted living facility. States she has taken forms that are needed for MD to  fill out to her primary care office.   Patient states she is having health issues that decease her ability to totally care for herself. Currently health issues are irritable bowel, Kidney disease,  Heart disease(has pacemaker), hx stroke, falls risk,  macular degeneration.  Currently has neighbor that takes her to grocery store weekly; uses [employer] benefit 6 times yearly round trip  to [employer] appointment in [place] and uses transportation on aging for transportation in [place].  States   Recommendations for community care coordinator due to falls risk & health history; , health coach for disease management  and clinical social worker social issues. Patient is declining services of community care coordinator and health coach   She consents to referral to Clinical social worker only.   Plan; will refer to clinical social worker assist patient with questions about assisted living. Telephonic care coordinator signing off. Sherrin Daisy, RN BSN [employer] Management Coordinator [facility] Care Management  [phone]

## 2015-04-11 ENCOUNTER — Other Ambulatory Visit: Payer: Self-pay | Admitting: Licensed Clinical Social Worker

## 2015-04-11 NOTE — Patient Outreach (Signed)
Assessment:  CSW received referral on client on 04/11/15. CSW called home phone number of client on 04/11/15. CSW verified identity of client.  CSW informed client that she had been referred to [facility] program support through Dr. Dennard Schaumann, primary care doctor for client.  CSW received verbal permission from client on 04/11/15 to speak with her about medical needs of client.  Client spoke of needs related to [facility] Aid and Attendance support.  She said that Dr. Dennard Schaumann had already completed his portion of the Aid and Attendance form.Client said she had worked on [employer].  CSW infomed client that [place] provided eligibility specialists in various geographic areas who could help client with Aid and Attendance application completion.  Client said she thought she had most of the application completed and had needed documents.  CSW spoke with client about various support services through [facility] program: nursing, social work and pharmacy support services.   Client spoke to CSW about Dr. Samella Parr recommendation is for Assisted Living Level of Care.  She said she has toured some Assisted Living Facilities previously.  She has toured [facility] in [place], [place] and [facility] in [place], [place].  She said that her son lives in [place], [place].  She has a younger son who lives in [place], [place].   She said she had toured these two facilities above in [place], [place].  [place] talked with client about [facility] program. [place] and client completed needed [facility] assessments. Client spoke of using a pacemaker.  She said she had a pacemaker placed about 3 years ago.  She said she sees a cardiologist and also sees a kidney specialist.  She said she is not currently on dialysis.  She said she had a heart attack in 2007; she has macular degeneration.  She no longer has her driver's licence.  She said her daughter in law sometimes takes her to scheduled medical appointments.  She said she takes [facility] ([place]) for transport support to medical appointments.  She said she uses oxygen at night to help her sleep.  She said she is frequently tired, fatigued and has low energy.  She said she uses oxygen at 2 liters at night to help her sleep. CSW provided client with [facility] CSW phone number [phone]) and encouraged her to call CSW to discuss social work needs of client.  CSW and client agreed for CSW to call client next week to further discuss needs of client.  Plan: CSW to call client next week to discuss current needs/status of client. Client to take medications as prescribed and attend scheduled medical appointments. Client to communicate with Dr. Dennard Schaumann regarding medical needs of client.  Norva Riffle.Shamarra Warda MSW, LCSW Licensed Clinical Social Worker [facility] Care Management [phone]

## 2015-04-11 NOTE — Patient Outreach (Signed)
[facility]) Care Management  04/11/2015  [name] Sep 04, 1946 [id number]   Request from Sherrin Daisy, RN to assign SW, assigned [facility], [place].  [name]. [place], [place] Management [place] Assistant Phone: [phone] Fax: [phone]

## 2015-04-14 ENCOUNTER — Encounter: Payer: Self-pay | Admitting: Licensed Clinical Social Worker

## 2015-04-14 ENCOUNTER — Other Ambulatory Visit: Payer: Self-pay | Admitting: Licensed Clinical Social Worker

## 2015-04-14 NOTE — Patient Outreach (Signed)
Assessment: CSW called home phone number of client on 04/14/15. CSW verified identity of client.  CSW spoke with [name] on 04/14/15 about current needs of client.  CSW spoke with [name] about completing [facility] consent form for client.  CSW informed [name] that [place] had obtained information on items needed as part of a VA Aid and Attendance application and had address of [name] eligibility specialist at [place], [place] office.  Client said she was struggling to collect documents needed for VA Aid and Attendance application.  CSW and client agreed for CSW to conduct routine home visit with client on 04/15/15 at 1:00 PM to review [facility] consent form with client and provide client with information regarding VA Aid and Attendance application process.  Client was appreciative of phone call from [place] on 04/14/15.  Client continues to have reduced energy, fatigue at this time.   Plan: CSW to conduct routine home visit with client on 04/15/15 at 1:00 PM to review current needs of client. Client to take medications as prescribed and to attend scheduled medical appointments.  [name].[name] MSW, LCSW Licensed Clinical Social Worker [facility] Care Management [phone]

## 2015-04-14 NOTE — Patient Outreach (Signed)
[facility]) Care Management  [facility] Social Work  04/14/2015  [name] [birth year] [id number]  Subjective:    Objective:   Current Medications:  Current Outpatient Prescriptions  Medication Sig Dispense Refill  . albuterol (PROVENTIL HFA;VENTOLIN HFA) 108 (90 BASE) MCG/ACT inhaler Inhale 2 puffs into the lungs every 6 (six) hours as needed for wheezing or shortness of breath.    Marland Kitchen albuterol (PROVENTIL) (2.5 MG/3ML) 0.083% nebulizer solution Take 3 mLs (2.5 mg total) by nebulization every 4 (four) hours as needed for wheezing or shortness of breath. 150 mL 1  . ALPRAZolam (XANAX) 1 MG tablet TK 1 T PO QHS PRN  3  . aspirin 81 MG tablet Take 81 mg by mouth daily.    Marland Kitchen atorvastatin (LIPITOR) 20 MG tablet Take 1 tablet (20 mg total) by mouth daily. 90 tablet 3  . clopidogrel (PLAVIX) 75 MG tablet Take 1 tablet (75 mg total) by mouth daily with breakfast. 90 tablet 3  . furosemide (LASIX) 20 MG tablet Alternate 20 mg / 40 mg daily . 45 tablet 6  . isosorbide mononitrate (IMDUR) 60 MG 24 hr tablet Take 1 tablet (60 mg total) by mouth daily. 90 tablet 3  . levothyroxine (SYNTHROID, LEVOTHROID) 50 MCG tablet Take 1 tablet (50 mcg total) by mouth daily before breakfast. 90 tablet 3  . metoprolol (LOPRESSOR) 50 MG tablet Take 50 mg by mouth 2 (two) times daily.    . nitroGLYCERIN (NITROSTAT) 0.4 MG SL tablet Place 0.4 mg under the tongue every 5 (five) minutes as needed for chest pain.    . pantoprazole (PROTONIX) 40 MG tablet Take 1 tablet (40 mg total) by mouth daily. 90 tablet 3  . pramipexole (MIRAPEX) 1 MG tablet Take 1.5 tablets (1.5 mg total) by mouth daily. 135 tablet 3  . sertraline (ZOLOFT) 100 MG tablet Take 2 tablets (200 mg total) by mouth daily. 2 tabs at bedtime 180 tablet 3  . traZODone (DESYREL) 100 MG tablet TAKE 2 TABLETS AT BEDTIME. 60 tablet 2  . Vitamin D, Ergocalciferol, (DRISDOL) 50000 UNITS CAPS capsule take 1 capsule by mouth every week FOR FOUR  WEEKS, THEN ONE CAPSULE ONCE A MONTH FOR 6 MONTHS  0   No current facility-administered medications for this visit.    Functional Status:  In your present state of health, do you have any difficulty performing the following activities: 04/11/2015 04/11/2015  Hearing? (No Data) Y  Vision? - Y  Difficulty concentrating or making decisions? - Y  Walking or climbing stairs? - Y  Dressing or bathing? - Y  Doing errands, shopping? - Y  Preparing Food and eating ? N -  Using the Toilet? N -  In the past six months, have you accidently leaked urine? N -  Do you have problems with loss of bowel control? N -  Managing your Medications? N -  Managing your Finances? N -  Housekeeping or managing your Housekeeping? [name]    Fall/Depression Screening:  PHQ 2/9 Scores 04/11/2015  PHQ - 2 Score 2  PHQ- 9 Score 7    Assessment:  CSW completed medication review for client, completed allergy review for client, and completed problem list review for client.  CSW reviewed psychosocial assessment for client.  CSW is scheduled to conduct home visit with client on 04/15/15 at home of client at 1:00 PM.  Plan; Client to take medications as prescribed and attend scheduled medical appointments. Client to communicate with her primary doctor as  needed to discuss medical needs of client.  Norva Riffle.[name] MSW, LCSW Licensed Clinical Social Worker [facility] Care Management [phone]  Plan:

## 2015-04-15 ENCOUNTER — Other Ambulatory Visit: Payer: Self-pay | Admitting: Licensed Clinical Social Worker

## 2015-04-15 ENCOUNTER — Encounter: Payer: Self-pay | Admitting: Licensed Clinical Social Worker

## 2015-04-15 NOTE — Patient Outreach (Signed)
[place] [facility]) Care Management  [facility] Social Work  04/15/2015  [name] 09-19-1946 [id number]  Subjective:    Objective:   Current Medications:  Current Outpatient Prescriptions  Medication Sig Dispense Refill  . albuterol (PROVENTIL HFA;VENTOLIN HFA) 108 (90 BASE) MCG/ACT inhaler Inhale 2 puffs into the lungs every 6 (six) hours as needed for wheezing or shortness of breath.    Marland Kitchen albuterol (PROVENTIL) (2.5 MG/3ML) 0.083% nebulizer solution Take 3 mLs (2.5 mg total) by nebulization every 4 (four) hours as needed for wheezing or shortness of breath. 150 mL 1  . ALPRAZolam (XANAX) 1 MG tablet TK 1 T PO QHS PRN  3  . aspirin 81 MG tablet Take 81 mg by mouth daily.    Marland Kitchen atorvastatin (LIPITOR) 20 MG tablet Take 1 tablet (20 mg total) by mouth daily. 90 tablet 3  . clopidogrel (PLAVIX) 75 MG tablet Take 1 tablet (75 mg total) by mouth daily with breakfast. 90 tablet 3  . furosemide (LASIX) 20 MG tablet Alternate 20 mg / 40 mg daily . 45 tablet 6  . isosorbide mononitrate (IMDUR) 60 MG 24 hr tablet Take 1 tablet (60 mg total) by mouth daily. 90 tablet 3  . levothyroxine (SYNTHROID, LEVOTHROID) 50 MCG tablet Take 1 tablet (50 mcg total) by mouth daily before breakfast. 90 tablet 3  . metoprolol (LOPRESSOR) 50 MG tablet Take 50 mg by mouth 2 (two) times daily.    . nitroGLYCERIN (NITROSTAT) 0.4 MG SL tablet Place 0.4 mg under the tongue every 5 (five) minutes as needed for chest pain.    . pantoprazole (PROTONIX) 40 MG tablet Take 1 tablet (40 mg total) by mouth daily. 90 tablet 3  . pramipexole (MIRAPEX) 1 MG tablet Take 1.5 tablets (1.5 mg total) by mouth daily. 135 tablet 3  . sertraline (ZOLOFT) 100 MG tablet Take 2 tablets (200 mg total) by mouth daily. 2 tabs at bedtime 180 tablet 3  . traZODone (DESYREL) 100 MG tablet TAKE 2 TABLETS AT BEDTIME. 60 tablet 2  . Vitamin D, Ergocalciferol, (DRISDOL) 50000 UNITS CAPS capsule take 1 capsule by mouth every week FOR FOUR  WEEKS, THEN ONE CAPSULE ONCE A MONTH FOR 6 MONTHS  0   No current facility-administered medications for this visit.    Functional Status:  In your present state of health, do you have any difficulty performing the following activities: 04/11/2015 04/11/2015  Hearing? (No Data) Y  Vision? - Y  Difficulty concentrating or making decisions? - Y  Walking or climbing stairs? - Y  Dressing or bathing? - Y  Doing errands, shopping? - Y  Preparing Food and eating ? N -  Using the Toilet? N -  In the past six months, have you accidently leaked urine? N -  Do you have problems with loss of bowel control? N -  Managing your Medications? N -  Managing your Finances? N -  Housekeeping or managing your Housekeeping? [name]    Fall/Depression Screening:  PHQ 2/9 Scores 04/11/2015  PHQ - 2 Score 2  PHQ- 9 Score 7    Assessment:   CSW met with client on 04/15/15 at home of client for a routine home visit. CSW and client spoke of current needs of client.  CSW and client reviewed [facility] consent form. CSW and client completed [facility] consent form for client on 04/15/15. CSW gave client the yellow copy of [facility] completed consent form. CSW and client also completed a Financial Assessment Form for  client on 04/15/15.  CSW and client spoke of financial challenges for client. Client said that most of her monthly income is used to pay monthly bills and she has no extra money each month.  She has no money in checking or savings to utilize.  CSW and client spoke of transport needs of client.  Client said she sometimes used [facility] [facility] to help her go to medical appointments.  CSW gave client [employer] bus tickets for use in [place], [place].  [place] gave client [employer] tickets for 4 round trip [employer] transports in [place], [place].  [place] gave client tickets for 4 round trip [employer] transports in [place], [place].  Client said she had also been using her [employer] transport benefits for the year 2016.   CSW and client spoke of assisted living facilities she had toured in [place], [place]. She discussed that one facility, [facility] in [place], [place] was more affordable to her monthly income.  However, she said she would likely have to qualify for VA Aid and Attendance benefit prior to going to an assisted living facility.  CSW and client spoke of [place] Aid and Attendance application.  CSW gave client the name of the [facility] in [place], [place].  [place] also gave [place] the phone number of the [facility] in [place], [place] encouraged [place] to call [facility] in [place], [place] to speak with a [place] eligibility specialist regarding her application process for [place] Aid and Attendance.  Client has well kept home; home is clean and well managed. Client uses oxygen at 2 liters at night to her her sleep. Client has a cane and a rollator walker to use to help her walk.  She said she sometimes bumps into furniture or the wall and becomes more fatigued later in the day. She said she plans her meetings for early in the day when she feels better.  CSW and client spoke of [facility] resources through nursing, social work and [employer].  CSW thanked client for home visit with client on 04/15/15. CSW gave [facility] CSW card and invited [place] to call CSW as needed at [phone] to discuss social work needs of client.  Plan:  Client to take medications as prescribed and to attend scheduled medical appointments. CSW to speak with Damita Rhodie regarding transport needs of client. CSW to call client in two weeks to assess needs of client.  Norva Riffle.Romell Cavanah MSW, LCSW Licensed Clinical Social Worker [facility] Care Management [phone]

## 2015-04-15 NOTE — Patient Outreach (Signed)
[facility] [facility]) Care Management  [facility] Social Work  04/15/2015  TAHRA HITZEMAN Feb 22, 1946 [id number]  Subjective:    Objective:   Current Medications:  Current Outpatient Prescriptions  Medication Sig Dispense Refill  . albuterol (PROVENTIL HFA;VENTOLIN HFA) 108 (90 BASE) MCG/ACT inhaler Inhale 2 puffs into the lungs every 6 (six) hours as needed for wheezing or shortness of breath.    Marland Kitchen albuterol (PROVENTIL) (2.5 MG/3ML) 0.083% nebulizer solution Take 3 mLs (2.5 mg total) by nebulization every 4 (four) hours as needed for wheezing or shortness of breath. 150 mL 1  . ALPRAZolam (XANAX) 1 MG tablet TK 1 T PO QHS PRN  3  . aspirin 81 MG tablet Take 81 mg by mouth daily.    Marland Kitchen atorvastatin (LIPITOR) 20 MG tablet Take 1 tablet (20 mg total) by mouth daily. 90 tablet 3  . clopidogrel (PLAVIX) 75 MG tablet Take 1 tablet (75 mg total) by mouth daily with breakfast. 90 tablet 3  . furosemide (LASIX) 20 MG tablet Alternate 20 mg / 40 mg daily . 45 tablet 6  . isosorbide mononitrate (IMDUR) 60 MG 24 hr tablet Take 1 tablet (60 mg total) by mouth daily. 90 tablet 3  . levothyroxine (SYNTHROID, LEVOTHROID) 50 MCG tablet Take 1 tablet (50 mcg total) by mouth daily before breakfast. 90 tablet 3  . metoprolol (LOPRESSOR) 50 MG tablet Take 50 mg by mouth 2 (two) times daily.    . nitroGLYCERIN (NITROSTAT) 0.4 MG SL tablet Place 0.4 mg under the tongue every 5 (five) minutes as needed for chest pain.    . pantoprazole (PROTONIX) 40 MG tablet Take 1 tablet (40 mg total) by mouth daily. 90 tablet 3  . pramipexole (MIRAPEX) 1 MG tablet Take 1.5 tablets (1.5 mg total) by mouth daily. 135 tablet 3  . sertraline (ZOLOFT) 100 MG tablet Take 2 tablets (200 mg total) by mouth daily. 2 tabs at bedtime 180 tablet 3  . traZODone (DESYREL) 100 MG tablet TAKE 2 TABLETS AT BEDTIME. 60 tablet 2  . Vitamin D, Ergocalciferol, (DRISDOL) 50000 UNITS CAPS capsule take 1 capsule by mouth every week FOR FOUR  WEEKS, THEN ONE CAPSULE ONCE A MONTH FOR 6 MONTHS  0   No current facility-administered medications for this visit.    Functional Status:  In your present state of health, do you have any difficulty performing the following activities: 04/11/2015 04/11/2015  Hearing? (No Data) Y  Vision? - Y  Difficulty concentrating or making decisions? - Y  Walking or climbing stairs? - Y  Dressing or bathing? - Y  Doing errands, shopping? - Y  Preparing Food and eating ? N -  Using the Toilet? N -  In the past six months, have you accidently leaked urine? N -  Do you have problems with loss of bowel control? N -  Managing your Medications? N -  Managing your Finances? N -  Housekeeping or managing your Housekeeping? [name]    Fall/Depression Screening:  PHQ 2/9 Scores 04/11/2015  PHQ - 2 Score 2  PHQ- 9 Score 7    Assessment:   CSW completed chart review on client on 04/15/15.  CSW did conduct home visit with client on 04/15/15.  CSW also documented on 04/15/15 [facility] Outcomes for client visit with CSW on 04/15/15.  Plan: Client to communicate with primary doctor related to medical needs of client. CSW to call client in two weeks to assess needs of client.  Norva Riffle.Garey Alleva MSW, LCSW  Licensed Clinical Social Worker [facility] Care Management [phone]  Plan:

## 2015-04-17 ENCOUNTER — Other Ambulatory Visit: Payer: Self-pay | Admitting: Family Medicine

## 2015-04-17 ENCOUNTER — Encounter: Payer: Self-pay | Admitting: Nephrology

## 2015-04-17 MED ORDER — ALPRAZOLAM 1 MG PO TABS: 1.0000 mg | ORAL_TABLET | Freq: Every evening | ORAL | Status: DC | PRN

## 2015-04-17 NOTE — Telephone Encounter (Signed)
ok 

## 2015-04-17 NOTE — Telephone Encounter (Signed)
Medication refilled per protocol. 

## 2015-04-17 NOTE — Telephone Encounter (Signed)
LRF 09/11/14 per pharmacy.   OK refill?

## 2015-05-20 ENCOUNTER — Other Ambulatory Visit: Payer: Self-pay | Admitting: Licensed Clinical Social Worker

## 2015-05-20 NOTE — Patient Outreach (Signed)
Assessment:  CSW called home number of client on 05/20/15.  CSW verified identity of client.  CSW and client spoke of current needs of client.  Client said she toured 2 facilities in [place], [place] related to assisted living level of care..  The facility she is now considering is called [facility].  She said she has applied through the [employer] of the [place] for assistance /support.  She is seeking Aid and Attendance support through [place] benefits.  She said she had collected needed documents to apply for VA Aid and Attendance support and had mailed in application for this support on  05/08/15. She said if she did not qualify for VA Aid and Attendance she would need to remain at current residence.  She said she saw Dr. Dennard Schaumann recently when he completed [place] and Attendance form needed from client's medical doctor.  She said she will go back to see Dr. Dennard Schaumann for annual physical in October of 2016.  She said she has her prescribed medications. She receives her prescribed medication via [facility] mail order delivery.  She said her utility bill per month is going up in cost. She said she is scheduled for an appointment with cardiologist in December of 2016.  She said she has her pacemaker check via machine in her home on June 09, 2015.  She said she is hoping application for VA Aid and Attendance will be approved. She said she heard it could take a number of months for such an application to be revewed. CSW informed client that client could also call [employer] in [place], [place] at [phone] to talk with that representative about benefits for veterans. Client wrote down number of [employer] and said she would call that representative to speak more about veteran's benefits available possibly for client.  CSW gave client the [place] number of [phone] and encouraged Mathilda to call CSW as needed to address social work needs of client.  CSW thanked [place]  for phone conversation on 05/20/15.   Plan:  Client to take medications as prescribed and attend scheduled medical appointments. Client to communicate as needed with Dr. Dennard Schaumann to discuss medical needs of client. CSW to call client in 4 weeks to assess needs of client at that time.  Norva Riffle.Volney Reierson MSW, LCSW Licensed Clinical Social Worker [facility] Care Management [phone]

## 2015-05-28 ENCOUNTER — Telehealth: Payer: Self-pay | Admitting: Family Medicine

## 2015-05-28 MED ORDER — CLOPIDOGREL BISULFATE 75 MG PO TABS: 75.0000 mg | ORAL_TABLET | Freq: Every day | ORAL | Status: DC

## 2015-05-28 NOTE — Telephone Encounter (Signed)
Pt called and stated that she needed to get her plavix refilled through her mail order pharm and a small supply to a local pharm. Plavix sent to both pharmacies.

## 2015-06-04 ENCOUNTER — Telehealth: Payer: Self-pay | Admitting: *Deleted

## 2015-06-04 NOTE — Telephone Encounter (Signed)
I looked into Acuity connect to place the referral this has already been done by Suanne Marker at [facility] with authorization number [id number] placed on 05/30/15

## 2015-06-04 NOTE — Telephone Encounter (Signed)
Submitted humana referral thru acuity connect for authorization to Dr. 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

## 2015-06-09 ENCOUNTER — Ambulatory Visit (INDEPENDENT_AMBULATORY_CARE_PROVIDER_SITE_OTHER): Payer: Commercial Managed Care - HMO | Admitting: *Deleted

## 2015-06-09 DIAGNOSIS — I495 Sick sinus syndrome: Secondary | ICD-10-CM

## 2015-06-09 NOTE — Progress Notes (Signed)
Remote pacemaker transmission.   

## 2015-06-16 LAB — CUP PACEART REMOTE DEVICE CHECK
Battery Remaining Percentage: 100 %
Brady Statistic RA Percent Paced: 99 %
Brady Statistic RV Percent Paced: 21 %
Lead Channel Impedance Value: 567 Ohm
Lead Channel Sensing Intrinsic Amplitude: 4.8 mV
Lead Channel Setting Pacing Amplitude: 2.4 V
Lead Channel Setting Pacing Pulse Width: 0.4 ms
MDC IDC MSMT BATTERY REMAINING LONGEVITY: 72 mo
MDC IDC MSMT LEADCHNL RA SENSING INTR AMPL: 0.9 mV
MDC IDC MSMT LEADCHNL RV IMPEDANCE VALUE: 452 Ohm
MDC IDC PG SERIAL: 131914
MDC IDC SESS DTM: 20160912060100
MDC IDC SET LEADCHNL RA PACING AMPLITUDE: 2 V
MDC IDC SET LEADCHNL RV SENSING SENSITIVITY: 2.5 mV
Zone Setting Detection Interval: 400 ms

## 2015-06-27 ENCOUNTER — Encounter: Payer: Self-pay | Admitting: Cardiology

## 2015-07-11 ENCOUNTER — Encounter: Payer: Self-pay | Admitting: Family Medicine

## 2015-07-11 ENCOUNTER — Ambulatory Visit (INDEPENDENT_AMBULATORY_CARE_PROVIDER_SITE_OTHER): Payer: Commercial Managed Care - HMO | Admitting: Family Medicine

## 2015-07-11 VITALS — BP 116/66 | HR 84 | Temp 97.5°F | Resp 18 | Wt 205.0 lb

## 2015-07-11 DIAGNOSIS — Z13820 Encounter for screening for osteoporosis: Secondary | ICD-10-CM

## 2015-07-11 DIAGNOSIS — Z0000 Encounter for general adult medical examination without abnormal findings: Secondary | ICD-10-CM | POA: Diagnosis not present

## 2015-07-11 DIAGNOSIS — I2510 Atherosclerotic heart disease of native coronary artery without angina pectoris: Secondary | ICD-10-CM

## 2015-07-11 DIAGNOSIS — N184 Chronic kidney disease, stage 4 (severe): Secondary | ICD-10-CM | POA: Diagnosis not present

## 2015-07-11 DIAGNOSIS — Z23 Encounter for immunization: Secondary | ICD-10-CM | POA: Diagnosis not present

## 2015-07-11 DIAGNOSIS — J438 Other emphysema: Secondary | ICD-10-CM | POA: Diagnosis not present

## 2015-07-11 DIAGNOSIS — I10 Essential (primary) hypertension: Secondary | ICD-10-CM | POA: Diagnosis not present

## 2015-07-11 LAB — COMPLETE METABOLIC PANEL WITH GFR
ALBUMIN: 4.2 g/dL (ref 3.6–5.1)
ALK PHOS: 157 U/L — AB (ref 33–130)
ALT: 19 U/L (ref 6–29)
AST: 20 U/L (ref 10–35)
BILIRUBIN TOTAL: 0.5 mg/dL (ref 0.2–1.2)
BUN: 18 mg/dL (ref 7–25)
CALCIUM: 9.1 mg/dL (ref 8.6–10.4)
CO2: 24 mmol/L (ref 20–31)
CREATININE: 1.33 mg/dL — AB (ref 0.50–0.99)
Chloride: 101 mmol/L (ref 98–110)
GFR, EST AFRICAN AMERICAN: 47 mL/min — AB (ref >=60)
GFR, EST NON AFRICAN AMERICAN: 41 mL/min — AB (ref >=60)
Glucose, Bld: 99 mg/dL (ref 70–99)
Potassium: 4.3 mmol/L (ref 3.5–5.3)
Sodium: 140 mmol/L (ref 135–146)
TOTAL PROTEIN: 7.3 g/dL (ref 6.1–8.1)

## 2015-07-11 LAB — CBC WITH DIFFERENTIAL/PLATELET
Basophils Absolute: 0 10*3/uL (ref 0.0–0.1)
Basophils Relative: 0 % (ref 0–1)
EOS ABS: 0.2 10*3/uL (ref 0.0–0.7)
Eosinophils Relative: 2 % (ref 0–5)
HEMATOCRIT: 43.4 % (ref 36.0–46.0)
HEMOGLOBIN: 14.3 g/dL (ref 12.0–15.0)
LYMPHS ABS: 2 10*3/uL (ref 0.7–4.0)
LYMPHS PCT: 27 % (ref 12–46)
MCH: 27.5 pg (ref 26.0–34.0)
MCHC: 32.9 g/dL (ref 30.0–36.0)
MCV: 83.5 fL (ref 78.0–100.0)
MONOS PCT: 11 % (ref 3–12)
MPV: 9.1 fL (ref 8.6–12.4)
Monocytes Absolute: 0.8 10*3/uL (ref 0.1–1.0)
NEUTROS PCT: 60 % (ref 43–77)
Neutro Abs: 4.5 10*3/uL (ref 1.7–7.7)
PLATELETS: 243 10*3/uL (ref 150–400)
RBC: 5.2 MIL/uL — ABNORMAL HIGH (ref 3.87–5.11)
RDW: 15.8 % — ABNORMAL HIGH (ref 11.5–15.5)
WBC: 7.5 10*3/uL (ref 4.0–10.5)

## 2015-07-11 LAB — LIPID PANEL
CHOLESTEROL: 131 mg/dL (ref 125–200)
HDL: 69 mg/dL (ref >=46)
LDL Cholesterol: 50 mg/dL (ref <130)
TRIGLYCERIDES: 62 mg/dL (ref <150)
Total CHOL/HDL Ratio: 1.9 Ratio (ref <=5.0)
VLDL: 12 mg/dL (ref <30)

## 2015-07-11 LAB — TSH: TSH: 4.73 u[IU]/mL — ABNORMAL HIGH (ref 0.350–4.500)

## 2015-07-11 MED ORDER — TRAZODONE HCL 100 MG PO TABS: ORAL_TABLET | ORAL | Status: DC

## 2015-07-11 NOTE — Progress Notes (Signed)
Subjective:    Patient ID: [name], female    DOB: 13-Feb-1946, 69 y.o.   MRN: [id number]  HPI Patient is here today for an annual exam. She is due for a flu shot. She's had Pneumovax 23. She is due for Prevnar 13. She is also due for the shingles vaccine. Patient has a history of partial hysterectomy and therefore does not require Pap smears. Her last colonoscopy was in 2010 and is due again in 2015 due to the presence of a tubular adenoma. Therefore she is overdue for this. She is also overdue for a bone density test. Otherwise she is doing relatively well with no concerns. Past Medical History  Diagnosis Date  . Hypothyroid   . Stroke ([place])   . Syncope and collapse   . MI (myocardial infarction) ([place])     x2  . COPD (chronic obstructive pulmonary disease) ([place])   . Depression   . Insomnia   . Restless leg syndrome   . Coronary artery disease   . Bulging disc   . Chronic kidney disease, stage III (moderate)   . Macular degeneration   . Arteriosclerosis of coronary artery bypass graft   . Tachycardia   . Pacemaker     [employer]  . Esophageal reflux   . Unspecified hearing loss   . Mixed hyperlipidemia   . PVT (paroxysmal ventricular tachycardia) ([place])   . Anxiety   . Blood transfusion without reported diagnosis     10/29/2006  . Cataract 05/2012  . Osteoporosis   . Chronic kidney disease, stage 3    Past Surgical History  Procedure Laterality Date  . Coronary artery bypass graft      x 4  . Cardiac catheterization  2007    x2;[place]  . Cardiac catheterization  2008  . Cerebral angiogram    . Lumbar disc surgery    . Gallbladder surgery    . Partial hysterectomy    . Knee arthroscopy    . Cataract extraction Right   . Insert / replace / remove pacemaker  2013    [place]; [employer]  . Colonoscopy    . Eye surgery  05/2013    cataract removed  . Spine surgery  1999/2001/2006    3 disc replaced   Current Outpatient Prescriptions  on File Prior to Visit  Medication Sig Dispense Refill  . albuterol (PROVENTIL HFA;VENTOLIN HFA) 108 (90 BASE) MCG/ACT inhaler Inhale 2 puffs into the lungs every 6 (six) hours as needed for wheezing or shortness of breath.    Marland Kitchen albuterol (PROVENTIL) (2.5 MG/3ML) 0.083% nebulizer solution Take 3 mLs (2.5 mg total) by nebulization every 4 (four) hours as needed for wheezing or shortness of breath. 150 mL 1  . ALPRAZolam (XANAX) 1 MG tablet Take 1 tablet (1 mg total) by mouth at bedtime as needed for anxiety. 30 tablet 3  . aspirin 81 MG tablet Take 81 mg by mouth daily.    Marland Kitchen atorvastatin (LIPITOR) 20 MG tablet Take 1 tablet (20 mg total) by mouth daily. 90 tablet 3  . clopidogrel (PLAVIX) 75 MG tablet Take 1 tablet (75 mg total) by mouth daily with breakfast. 30 tablet 0  . furosemide (LASIX) 20 MG tablet Alternate 20 mg / 40 mg daily . 45 tablet 6  . isosorbide mononitrate (IMDUR) 60 MG 24 hr tablet Take 1 tablet (60 mg total) by mouth daily. 90 tablet 3  . levothyroxine (SYNTHROID, LEVOTHROID) 50 MCG tablet Take  1 tablet (50 mcg total) by mouth daily before breakfast. 90 tablet 3  . metoprolol (LOPRESSOR) 50 MG tablet Take 50 mg by mouth 2 (two) times daily.    . nitroGLYCERIN (NITROSTAT) 0.4 MG SL tablet Place 0.4 mg under the tongue every 5 (five) minutes as needed for chest pain.    . pantoprazole (PROTONIX) 40 MG tablet Take 1 tablet (40 mg total) by mouth daily. 90 tablet 3  . pramipexole (MIRAPEX) 1 MG tablet Take 1.5 tablets (1.5 mg total) by mouth daily. 135 tablet 3  . sertraline (ZOLOFT) 100 MG tablet Take 2 tablets (200 mg total) by mouth daily. 2 tabs at bedtime 180 tablet 3  . Vitamin D, Ergocalciferol, (DRISDOL) 50000 UNITS CAPS capsule take 1 capsule by mouth every week FOR FOUR WEEKS, THEN ONE CAPSULE ONCE A MONTH FOR 6 MONTHS  0   No current facility-administered medications on file prior to visit.   Allergies  Allergen Reactions  . Codeine     swelling  . Iohexol       Desc: PT HAD CONTRAST REACTION YRS AGO NEEDS 13 HR PREP FOR ALL CONTRAST STUDIES    Social History   Social History  . Marital Status: Married    Spouse Name: N/A  . Number of Children: N/A  . Years of Education: N/A   Occupational History  . Not on file.   Social History Main Topics  . Smoking status: Former Smoker -- 0.75 packs/day    Types: Cigarettes    Start date: 09/28/1965    Quit date: 05/27/2006  . Smokeless tobacco: Never Used  . Alcohol Use: No  . Drug Use: No  . Sexual Activity: Not Currently   Other Topics Concern  . Not on file   Social History Narrative   Family History  Problem Relation Age of Onset  . Stomach cancer Mother   . Diabetes Mother   . Hyperlipidemia Mother   . Hypertension Father   . Lung cancer Father   . Alcohol abuse Father   . Early death Brother       Review of Systems  All other systems reviewed and are negative.      Objective:   Physical Exam  Constitutional: She is oriented to person, place, and time. She appears well-developed and well-nourished. No distress.  HENT:  Head: Normocephalic and atraumatic.  Right Ear: External ear normal.  Left Ear: External ear normal.  Nose: Nose normal.  Mouth/Throat: Oropharynx is clear and moist. No oropharyngeal exudate.  Eyes: Conjunctivae and EOM are normal. Pupils are equal, round, and reactive to light. Right eye exhibits no discharge. Left eye exhibits no discharge. No scleral icterus.  Neck: Normal range of motion. Neck supple. No JVD present. No tracheal deviation present. No thyromegaly present.  Cardiovascular: Normal rate, regular rhythm, normal heart sounds and intact distal pulses.  Exam reveals no gallop and no friction rub.   No murmur heard. Pulmonary/Chest: Effort normal and breath sounds normal. No stridor. No respiratory distress. She has no wheezes. She has no rales. She exhibits no tenderness.  Abdominal: Soft. Bowel sounds are normal. She exhibits no distension  and no mass. There is no tenderness. There is no rebound and no guarding.  Musculoskeletal: Normal range of motion. She exhibits no edema or tenderness.  Lymphadenopathy:    She has no cervical adenopathy.  Neurological: She is alert and oriented to person, place, and time. She has normal reflexes. She displays normal reflexes. No cranial nerve  deficit. She exhibits normal muscle tone. Coordination normal.  Skin: Skin is warm. No rash noted. She is not diaphoretic. No erythema. No pallor.  Psychiatric: She has a normal mood and affect. Her behavior is normal. Judgment and thought content normal.  Vitals reviewed.         Assessment & Plan:  Need for prophylactic vaccination and inoculation against influenza - Plan: Flu Vaccine QUAD 36+ mos IM  Atherosclerosis of native coronary artery of native heart without angina pectoris - Plan: CBC with Differential, COMPLETE METABOLIC PANEL WITH GFR, Lipid panel  Chronic kidney disease (CKD), stage IV (severe) (HCC)  Essential hypertension - Plan: CBC with Differential, COMPLETE METABOLIC PANEL WITH GFR, Lipid panel  Other emphysema ([place])  Routine general medical examination at a health care facility - Plan: CBC with Differential, COMPLETE METABOLIC PANEL WITH GFR, Lipid panel, TSH  Patient's physical exam today is normal. Her blood pressure was acceptable. She received Prevnar 13 as well as the flu shot today. I recommended a mammogram as well as a colonoscopy. Patient refused both of these and does not want to proceed with any further cancer screening. She states emphatically that she would not want to treat even if she had it. I will check a bone density test to screen for osteopenia or osteoporosis. I will also check a CBC to monitor for anemia on her blood thinner. I'll check a fasting lipid panel to make sure her LDL cholesterol is less than 70. I will monitor her kidney function due to her chronic kidney disease. I will also check a TSH to  monitor her treatment of hypothyroidism

## 2015-07-14 ENCOUNTER — Encounter: Payer: Self-pay | Admitting: Internal Medicine

## 2015-07-14 ENCOUNTER — Encounter: Payer: Self-pay | Admitting: Family Medicine

## 2015-07-24 ENCOUNTER — Other Ambulatory Visit: Payer: Self-pay | Admitting: Licensed Clinical Social Worker

## 2015-07-24 NOTE — Patient Outreach (Signed)
Assessment: CSW called home phone number of client on 07/24/15.  CSW spoke with client via phone on 07/24/15. CSW verified identity of client. Client said she had mailed in the [place] Aid and Attendance application materials to the [place] on 05/08/2015.  She said she is waiting to hear back from the [place] regarding her application for [place] and Attendance.  Client lives alone at her home in [place], [place]. She continues to see Dr. Dennard Schaumann as her primary care physician. She said she had appointment with Dr. Dennard Schaumann two weeks ago. She said she had her prescribed medications and is taking medications as prescribed.  She said she had financial challenges.  She said she will need to hear back from [place] regarding her application for Aid and Attendance. She said she would not be able to afford to go to an Assisted Living facility without the [place] Aid and Attendance support. She said she does have financial struggles and it takes all of her monthly income to pay her monthly bills. She said she does have adequate food supply.  She said she had appointment with Dr. Dennard Schaumann on 07/11/15 for her annual physical.  She said she received her annual flu shot and pneumonia shot on 07/11/15 at Dr. Samella Parr office.  She said she receives transport to and from her medical appointments through [place]. Also she receives some transport help each year through [employer]. She said she receives six transport trips per year through her [employer] benefit.  She said she has appointment with her cardiologist on 09/08/15. CSW encouraged client to call CSW at [phone] as needed to discuss social work needs of client. CSW thanked client for phone call with CSW on 07/24/15.   Plan: Client to take medications as prescribed and attend scheduled medical appointments. Client to communicate with Dr. Dennard Schaumann, as needed, to discuss medical needs of client. CSW to call client in 4 weeks to assess needs of client  at that time.  Norva Riffle.Kacey Vicuna MSW, LCSW Licensed Clinical Social Worker [facility] Care Management [phone]

## 2015-08-12 ENCOUNTER — Encounter: Payer: Self-pay | Admitting: *Deleted

## 2015-08-13 ENCOUNTER — Other Ambulatory Visit: Payer: Self-pay | Admitting: Family Medicine

## 2015-08-13 NOTE — Telephone Encounter (Signed)
Pt is calling for a refill of pantoprazole called in to the [facility] mail order pharmacy. Pt ph [phone]

## 2015-08-14 MED ORDER — PANTOPRAZOLE SODIUM 40 MG PO TBEC: 40.0000 mg | DELAYED_RELEASE_TABLET | Freq: Every day | ORAL | Status: DC

## 2015-08-14 NOTE — Telephone Encounter (Signed)
Prescription sent to pharmacy.

## 2015-09-01 ENCOUNTER — Telehealth: Payer: Self-pay | Admitting: Family Medicine

## 2015-09-01 MED ORDER — PRAMIPEXOLE DIHYDROCHLORIDE 1 MG PO TABS: 1.5000 mg | ORAL_TABLET | Freq: Every day | ORAL | Status: DC

## 2015-09-01 NOTE — Telephone Encounter (Signed)
Patient needs her pramipexole sent to [place] if possible

## 2015-09-01 NOTE — Telephone Encounter (Signed)
Medication called/sent to requested pharmacy  

## 2015-09-08 ENCOUNTER — Ambulatory Visit (INDEPENDENT_AMBULATORY_CARE_PROVIDER_SITE_OTHER): Payer: Commercial Managed Care - HMO | Admitting: Cardiovascular Disease

## 2015-09-08 ENCOUNTER — Ambulatory Visit (INDEPENDENT_AMBULATORY_CARE_PROVIDER_SITE_OTHER): Payer: Commercial Managed Care - HMO | Admitting: Internal Medicine

## 2015-09-08 ENCOUNTER — Encounter: Payer: Self-pay | Admitting: Cardiovascular Disease

## 2015-09-08 ENCOUNTER — Encounter: Payer: Self-pay | Admitting: Internal Medicine

## 2015-09-08 VITALS — BP 118/74 | HR 80 | Ht 62.0 in | Wt 210.0 lb

## 2015-09-08 VITALS — BP 118/74 | HR 80 | Ht 62.0 in | Wt 209.0 lb

## 2015-09-08 DIAGNOSIS — Z87891 Personal history of nicotine dependence: Secondary | ICD-10-CM | POA: Diagnosis not present

## 2015-09-08 DIAGNOSIS — R600 Localized edema: Secondary | ICD-10-CM

## 2015-09-08 DIAGNOSIS — I10 Essential (primary) hypertension: Secondary | ICD-10-CM | POA: Diagnosis not present

## 2015-09-08 DIAGNOSIS — N189 Chronic kidney disease, unspecified: Secondary | ICD-10-CM

## 2015-09-08 DIAGNOSIS — F17200 Nicotine dependence, unspecified, uncomplicated: Secondary | ICD-10-CM

## 2015-09-08 DIAGNOSIS — J438 Other emphysema: Secondary | ICD-10-CM

## 2015-09-08 DIAGNOSIS — I25768 Atherosclerosis of bypass graft of coronary artery of transplanted heart with other forms of angina pectoris: Secondary | ICD-10-CM | POA: Diagnosis not present

## 2015-09-08 DIAGNOSIS — I2510 Atherosclerotic heart disease of native coronary artery without angina pectoris: Secondary | ICD-10-CM | POA: Diagnosis not present

## 2015-09-08 DIAGNOSIS — E785 Hyperlipidemia, unspecified: Secondary | ICD-10-CM | POA: Diagnosis not present

## 2015-09-08 DIAGNOSIS — Z8673 Personal history of transient ischemic attack (TIA), and cerebral infarction without residual deficits: Secondary | ICD-10-CM

## 2015-09-08 DIAGNOSIS — I495 Sick sinus syndrome: Secondary | ICD-10-CM

## 2015-09-08 DIAGNOSIS — Z45018 Encounter for adjustment and management of other part of cardiac pacemaker: Secondary | ICD-10-CM

## 2015-09-08 DIAGNOSIS — I471 Supraventricular tachycardia: Secondary | ICD-10-CM

## 2015-09-08 LAB — CUP PACEART INCLINIC DEVICE CHECK
Battery Remaining Longevity: 66 mo
Brady Statistic RA Percent Paced: 99 %
Implantable Lead Implant Date: 20130611
Implantable Lead Location: 753859
Implantable Lead Model: 4469
Lead Channel Impedance Value: 610 Ohm
Lead Channel Pacing Threshold Amplitude: 0.4 V
Lead Channel Pacing Threshold Amplitude: 0.8 V
Lead Channel Pacing Threshold Pulse Width: 0.4 ms
Lead Channel Setting Pacing Amplitude: 2 V
Lead Channel Setting Pacing Amplitude: 2.4 V
Lead Channel Setting Pacing Pulse Width: 0.4 ms
MDC IDC LEAD IMPLANT DT: 20130611
MDC IDC LEAD LOCATION: 753860
MDC IDC LEAD SERIAL: 566580
MDC IDC MSMT LEADCHNL RA SENSING INTR AMPL: 2 mV
MDC IDC MSMT LEADCHNL RV IMPEDANCE VALUE: 473 Ohm
MDC IDC MSMT LEADCHNL RV PACING THRESHOLD PULSEWIDTH: 0.4 ms
MDC IDC MSMT LEADCHNL RV SENSING INTR AMPL: 6.6 mV
MDC IDC SESS DTM: 20161212050000
MDC IDC SET LEADCHNL RV SENSING SENSITIVITY: 2.5 mV
MDC IDC STAT BRADY RV PERCENT PACED: 18 %
Pulse Gen Serial Number: 131914

## 2015-09-08 NOTE — Assessment & Plan Note (Signed)
She denies anginal symptoms. She is sedentary. She will continue her current meds.

## 2015-09-08 NOTE — Assessment & Plan Note (Signed)
She remains tobacco free. She is encouraged to abstain from smoking.

## 2015-09-08 NOTE — Progress Notes (Signed)
Patient ID: [name], female   DOB: Sep 01, 1946, 69 y.o.   MRN: [id number]      SUBJECTIVE: The patient returns for routine follow up. She saw Dr. Lovena Le today and it was noted her device demonstrates normal function.  She has a history of coronary artery bypass graft surgery, pacemaker, COPD, essential hypertension, hyperlipidemia, CKD and CVA. She had a normal nuclear myocardial perfusion study on 08/10/2013.  She denies chest pain. She he is looking to getting into assisted living. She has positional dizziness. She has been having memory problems. She remains depressed.  Soc: She does not see her 2 adult sons often as they have families of their own. She lives alone in [place]. She is friends with [name].   Review of Systems: As per "subjective", otherwise negative.  Allergies  Allergen Reactions  . Codeine     swelling  . Iohexol      Desc: PT HAD CONTRAST REACTION YRS AGO NEEDS 13 HR PREP FOR ALL CONTRAST STUDIES     Current Outpatient Prescriptions  Medication Sig Dispense Refill  . albuterol (PROVENTIL HFA;VENTOLIN HFA) 108 (90 BASE) MCG/ACT inhaler Inhale 2 puffs into the lungs every 6 (six) hours as needed for wheezing or shortness of breath.    Marland Kitchen albuterol (PROVENTIL) (2.5 MG/3ML) 0.083% nebulizer solution Take 3 mLs (2.5 mg total) by nebulization every 4 (four) hours as needed for wheezing or shortness of breath. 150 mL 1  . ALPRAZolam (XANAX) 1 MG tablet Take 1 tablet (1 mg total) by mouth at bedtime as needed for anxiety. 30 tablet 3  . aspirin 81 MG tablet Take 81 mg by mouth daily.    Marland Kitchen atorvastatin (LIPITOR) 20 MG tablet Take 1 tablet (20 mg total) by mouth daily. 90 tablet 3  . clopidogrel (PLAVIX) 75 MG tablet Take 1 tablet (75 mg total) by mouth daily with breakfast. 30 tablet 0  . furosemide (LASIX) 20 MG tablet Alternate 20 mg / 40 mg daily . 45 tablet 6  . isosorbide mononitrate (IMDUR) 60 MG 24 hr tablet Take 1 tablet (60 mg total) by mouth  daily. 90 tablet 3  . levothyroxine (SYNTHROID, LEVOTHROID) 50 MCG tablet Take 1 tablet (50 mcg total) by mouth daily before breakfast. 90 tablet 3  . metoprolol (LOPRESSOR) 50 MG tablet Take 50 mg by mouth 2 (two) times daily.    . nitroGLYCERIN (NITROSTAT) 0.4 MG SL tablet Place 0.4 mg under the tongue every 5 (five) minutes as needed for chest pain.    . pantoprazole (PROTONIX) 40 MG tablet Take 1 tablet (40 mg total) by mouth daily. 90 tablet 3  . potassium chloride (K-DUR) 10 MEQ tablet Take 1 tablet by mouth daily.  0  . pramipexole (MIRAPEX) 1 MG tablet Take 1.5 tablets (1.5 mg total) by mouth daily. 135 tablet 3  . sertraline (ZOLOFT) 100 MG tablet Take 2 tablets (200 mg total) by mouth daily. 2 tabs at bedtime 180 tablet 3  . traZODone (DESYREL) 100 MG tablet TAKE 2 TABLETS AT BEDTIME. 180 tablet 3  . Vitamin D, Ergocalciferol, (DRISDOL) 50000 UNITS CAPS capsule take 1 capsule by mouth every week FOR FOUR WEEKS, THEN ONE CAPSULE ONCE A MONTH FOR 6 MONTHS  0   No current facility-administered medications for this visit.    Past Medical History  Diagnosis Date  . Hypothyroid   . Stroke ([place])   . Syncope and collapse   . MI (myocardial infarction) ([place])     x2  .  COPD (chronic obstructive pulmonary disease) ([place])   . Depression   . Insomnia   . Restless leg syndrome   . Coronary artery disease   . Bulging disc   . Chronic kidney disease, stage III (moderate)   . Macular degeneration   . Arteriosclerosis of coronary artery bypass graft   . Tachycardia   . Pacemaker     [employer]  . Esophageal reflux   . Unspecified hearing loss   . Mixed hyperlipidemia   . PVT (paroxysmal ventricular tachycardia) ([place])   . Anxiety   . Blood transfusion without reported diagnosis     10/29/2006  . Cataract 05/2012  . Osteoporosis   . Chronic kidney disease, stage 3     Past Surgical History  Procedure Laterality Date  . Coronary artery bypass graft      x 4  . Cardiac  catheterization  2007    x2;cone  . Cardiac catheterization  2008  . Cerebral angiogram    . Lumbar disc surgery    . Gallbladder surgery    . Partial hysterectomy    . Knee arthroscopy    . Cataract extraction Right   . Insert / replace / remove pacemaker  2013    [place], [place]; [employer]  . Colonoscopy    . Eye surgery  05/2013    cataract removed  . Spine surgery  1999/2001/2006    3 disc replaced    Social History   Social History  . Marital Status: Married    Spouse Name: N/A  . Number of Children: N/A  . Years of Education: N/A   Occupational History  . Not on file.   Social History Main Topics  . Smoking status: Former Smoker -- 0.75 packs/day    Types: Cigarettes    Start date: 09/28/1965    Quit date: 05/27/2006  . Smokeless tobacco: Never Used  . Alcohol Use: No  . Drug Use: No  . Sexual Activity: Not Currently   Other Topics Concern  . Not on file   Social History Narrative     Filed Vitals:   09/08/15 1046  BP: 118/74  Pulse: 80  Height: 5\' 2"  (1.575 m)  Weight: 209 lb (94.802 kg)  SpO2: 96%    PHYSICAL EXAM General: NAD HEENT: Normal. Neck: No JVD, no thyromegaly. Lungs: Clear to auscultation bilaterally with normal respiratory effort. CV: Nondisplaced PMI.  Regular rate and rhythm, normal S1/S2, no S3/S4, no murmur. No pretibial or periankle edema.  No carotid bruit.    Abdomen: Soft, obese.  Neurologic: Alert and oriented x 3.  Psych: Normal affect. Skin: Normal. Musculoskeletal: No gross deformities. Extremities: No clubbing or cyanosis.   ECG: Most recent ECG reviewed.      ASSESSMENT AND PLAN: 1. CAD s/p CABG: Stable ischemic heart disease. Nuclear myocardial perfusion study normal in November 2014. Continue aspirin, metoprolol, Imdur, and statin therapy.  2. Essential HTN: Well controlled on current therapy. No changes.  3. Hyperlipidemia: HDL 69, LDL 50 on 07/11/15 . Continue low intensity statin therapy  with Lipitor 20 mg daily.  4. CVA: She is on both aspirin and Plavix. Stable, no changes.  5. COPD: Continue therapy with albuterol inhaler as needed. She quit smoking several years ago.  6. Pacemaker: Device interrogation as noted above. Continue metoprolol 50 mg twice daily.   7. CKD stage 3: Follows with renal.  8. Bilateral lower extremity edema: Continue Lasix 40 mg and 20 mg on alternate days.  Dispo: f/u 6 months.   Kate Sable, M.D., F.A.C.C.

## 2015-09-08 NOTE — Progress Notes (Signed)
HPI [name] returns today for followup. She has an extensive medical history including CAD, s/p CABG 2008, HTN, symptomatic bradycardia s/p PPM, and dyslipidemia. No chest pain or sob. No edema. No syncope. She has become increasingly sedentary over the years.  Allergies  Allergen Reactions  . Codeine     swelling  . Iohexol      Desc: PT HAD CONTRAST REACTION YRS AGO NEEDS 13 HR PREP FOR ALL CONTRAST STUDIES      Current Outpatient Prescriptions  Medication Sig Dispense Refill  . albuterol (PROVENTIL HFA;VENTOLIN HFA) 108 (90 BASE) MCG/ACT inhaler Inhale 2 puffs into the lungs every 6 (six) hours as needed for wheezing or shortness of breath.    Marland Kitchen albuterol (PROVENTIL) (2.5 MG/3ML) 0.083% nebulizer solution Take 3 mLs (2.5 mg total) by nebulization every 4 (four) hours as needed for wheezing or shortness of breath. 150 mL 1  . ALPRAZolam (XANAX) 1 MG tablet Take 1 tablet (1 mg total) by mouth at bedtime as needed for anxiety. 30 tablet 3  . aspirin 81 MG tablet Take 81 mg by mouth daily.    Marland Kitchen atorvastatin (LIPITOR) 20 MG tablet Take 1 tablet (20 mg total) by mouth daily. 90 tablet 3  . clopidogrel (PLAVIX) 75 MG tablet Take 1 tablet (75 mg total) by mouth daily with breakfast. 30 tablet 0  . furosemide (LASIX) 20 MG tablet Alternate 20 mg / 40 mg daily . 45 tablet 6  . isosorbide mononitrate (IMDUR) 60 MG 24 hr tablet Take 1 tablet (60 mg total) by mouth daily. 90 tablet 3  . levothyroxine (SYNTHROID, LEVOTHROID) 50 MCG tablet Take 1 tablet (50 mcg total) by mouth daily before breakfast. 90 tablet 3  . metoprolol (LOPRESSOR) 50 MG tablet Take 50 mg by mouth 2 (two) times daily.    . nitroGLYCERIN (NITROSTAT) 0.4 MG SL tablet Place 0.4 mg under the tongue every 5 (five) minutes as needed for chest pain.    . pantoprazole (PROTONIX) 40 MG tablet Take 1 tablet (40 mg total) by mouth daily. 90 tablet 3  . potassium chloride (K-DUR) 10 MEQ tablet Take 1 tablet by mouth daily.  0    . pramipexole (MIRAPEX) 1 MG tablet Take 1.5 tablets (1.5 mg total) by mouth daily. 135 tablet 3  . sertraline (ZOLOFT) 100 MG tablet Take 2 tablets (200 mg total) by mouth daily. 2 tabs at bedtime 180 tablet 3  . traZODone (DESYREL) 100 MG tablet TAKE 2 TABLETS AT BEDTIME. 180 tablet 3  . Vitamin D, Ergocalciferol, (DRISDOL) 50000 UNITS CAPS capsule take 1 capsule by mouth every week FOR FOUR WEEKS, THEN ONE CAPSULE ONCE A MONTH FOR 6 MONTHS  0   No current facility-administered medications for this visit.     Past Medical History  Diagnosis Date  . Hypothyroid   . Stroke ([place])   . Syncope and collapse   . MI (myocardial infarction) ([place])     x2  . COPD (chronic obstructive pulmonary disease) ([place])   . Depression   . Insomnia   . Restless leg syndrome   . Coronary artery disease   . Bulging disc   . Chronic kidney disease, stage III (moderate)   . Macular degeneration   . Arteriosclerosis of coronary artery bypass graft   . Tachycardia   . Pacemaker     [employer]  . Esophageal reflux   . Unspecified hearing loss   . Mixed hyperlipidemia   . PVT (  paroxysmal ventricular tachycardia) ([place])   . Anxiety   . Blood transfusion without reported diagnosis     10/29/2006  . Cataract 05/2012  . Osteoporosis   . Chronic kidney disease, stage 3     ROS:   All systems reviewed and negative except as noted in the HPI.   Past Surgical History  Procedure Laterality Date  . Coronary artery bypass graft      x 4  . Cardiac catheterization  2007    x2;cone  . Cardiac catheterization  2008  . Cerebral angiogram    . Lumbar disc surgery    . Gallbladder surgery    . Partial hysterectomy    . Knee arthroscopy    . Cataract extraction Right   . Insert / replace / remove pacemaker  2013    [place], [place]; [employer]  . Colonoscopy    . Eye surgery  05/2013    cataract removed  . Spine surgery  1999/2001/2006    3 disc replaced     Family History   Problem Relation Age of Onset  . Stomach cancer Mother   . Diabetes Mother   . Hyperlipidemia Mother   . Hypertension Father   . Lung cancer Father   . Alcohol abuse Father   . Early death Brother      Social History   Social History  . Marital Status: Married    Spouse Name: N/A  . Number of Children: N/A  . Years of Education: N/A   Occupational History  . Not on file.   Social History Main Topics  . Smoking status: Former Smoker -- 0.75 packs/day    Types: Cigarettes    Start date: 09/28/1965    Quit date: 05/27/2006  . Smokeless tobacco: Never Used  . Alcohol Use: No  . Drug Use: No  . Sexual Activity: Not Currently   Other Topics Concern  . Not on file   Social History Narrative     BP 118/74 mmHg  Pulse 80  Ht 5\' 2"  (1.575 m)  Wt 210 lb (95.255 kg)  BMI 38.40 kg/m2  SpO2 98%  Physical Exam:  Well appearing middle aged woman, NAD HEENT: Unremarkable Neck:  6 cm JVD, no thyromegally Lymphatics:  No adenopathy Back:  No CVA tenderness Lungs:  Clear with no wheezes HEART:  Regular rate rhythm, no murmurs, no rubs, no clicks Abd:  soft, positive bowel sounds, no organomegally, no rebound, no guarding Ext:  2 plus pulses, trace edema in right lowere leg, no cyanosis, no clubbing Skin:  No rashes no nodules Neuro:  CN II through XII intact, motor grossly intact   DEVICE  Normal device function.  See PaceArt for details.   Assess/Plan:

## 2015-09-08 NOTE — Assessment & Plan Note (Signed)
Her [place] [facility] DDD PM is working normally. Will recheck in several months. 

## 2015-09-08 NOTE — Patient Instructions (Addendum)
Your physician wants you to follow-up in: 1 year with Dr. Lovena Le.  You will receive a reminder letter in the mail two months in advance. If you don't receive a letter, please call our office to schedule the follow-up appointment.  Your physician recommends that you continue on your current medications as directed. Please refer to the Current Medication list given to you today.  Remote monitoring is used to monitor your Pacemaker of ICD from home. This monitoring reduces the number of office visits required to check your device to one time per year. It allows [place] to keep an eye on the functioning of your device to ensure it is working properly. You are scheduled for a device check from home on 12/08/15. You may send your transmission at any time that day. If you have a wireless device, the transmission will be sent automatically. After your physician reviews your transmission, you will receive a postcard with your next transmission date.   If you need a refill on your cardiac medications before your next appointment, please call your pharmacy.  Thank you for choosing [facility]!

## 2015-09-08 NOTE — Patient Instructions (Signed)
Your physician wants you to follow-up in: 6 Months [place].  You will receive a reminder letter in the mail two months in advance. If you don't receive a letter, please call our office to schedule the follow-up appointment.  Your physician recommends that you continue on your current medications as directed. Please refer to the Current Medication list given to you today.  If you need a refill on your cardiac medications before your next appointment, please call your pharmacy.  Thank you for choosing [facility]!

## 2015-09-09 ENCOUNTER — Telehealth: Payer: Self-pay | Admitting: *Deleted

## 2015-09-09 NOTE — Telephone Encounter (Signed)
Received fax from [facility] Management with authorization to Dr. 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

## 2015-09-15 ENCOUNTER — Encounter: Payer: Self-pay | Admitting: Licensed Clinical Social Worker

## 2015-09-15 ENCOUNTER — Other Ambulatory Visit: Payer: Self-pay | Admitting: Licensed Clinical Social Worker

## 2015-09-15 NOTE — Patient Outreach (Signed)
Assessment:  CSW called client home phone number on 09/15/15.  CSW spoke via phone with client on 09/15/15. CSW verified identity of client. CSW and client spoke of client needs.  Client spoke with CSW about her [place] application for Aid and Attendance support for client.  She said someone was scheduled to be in touch with her via phone from the [place] in next few days.   She said that she would need to receive help form [facility] and [facility] to be able to afford to go to a care facility.  She said she has toured numerous care facilities.  She said she continues to see medical specialists as scheduled. She said she saw her cardiologist recently.  She said she could afford to go to [facility] in [place], [place] if [place] and Attendance support is approved for her. She said if [facility] Aid and Attendance is not approved she would need to remain at her current home.  She said she has financial challenges. She said she has adequate food supply. She has her prescribed medications.  She said she gets some transport help to medical appointments through [employer].  She said she uses [facility] also to help transport her to and from  medical appointments in [place], [place] .  She has appointment with kidney specialist on October 16, 2015.  She said she had her annual physical with Dr. Dennard Schaumann on July 11, 2015.  She said she is attending her medical appointments as scheduled.  CSW and client spoke of her care plan goals. CSW informed client that she had met her care plan goals. Client agreed that she had met her care plan goals. CSW informed client that since she had met her care plan goals, CSW would discharge client from [facility] services on 09/15/15. Client agreed to this plan. She was very appreciative of [facility] support in recent months.  CSW thanked client for phone conversation with CSW on 09/15/15.  CSW encouraged client to continue to communicate with Dr. Dennard Schaumann, as needed, to  discuss current medical needs of client.  Client said she planned on attending all scheduled medical appointments.    Plan:  CSW is discharging client on 09/15/15 from [facility] services  since client has met her care plan goals set. CSW to inform Lurline Del that [facility] discharged client on 09/15/15. CSW to fax physician case closure letter to Dr. Dennard Schaumann informing Dr. Dennard Schaumann that [facility] discharged client on 09/15/15 from [facility] CSW services.  Norva Riffle.Shavawn Stobaugh MSW, LCSW Licensed Clinical Social Worker [facility] Care Management [phone]

## 2015-09-17 ENCOUNTER — Other Ambulatory Visit: Payer: Self-pay | Admitting: Cardiovascular Disease

## 2015-10-14 ENCOUNTER — Telehealth: Payer: Self-pay | Admitting: *Deleted

## 2015-10-14 NOTE — Telephone Encounter (Signed)
Submitted humana referral thru acuity connect for authorization to Dr. Adair Laundry with authorization [id number]  Requesting provider: Flonnie Hailstone  Treating provider: Adair Laundry  Number of visits:6  Start Date: 10/16/15  End Date:04/13/16  Dx: N18.9- Chronic Kidney disease,unspecified   Authorization number was given verbally via phone at [facility].

## 2015-11-10 ENCOUNTER — Telehealth: Payer: Self-pay | Admitting: Cardiovascular Disease

## 2015-11-10 NOTE — Telephone Encounter (Signed)
Please needs letter as to why she shouldn't be living alone. Please call patient with any questions. / tg

## 2015-11-10 NOTE — Telephone Encounter (Signed)
I can type a letter if there are reasons pt should go to assisted living.Apparently she has to break a lease in her apartment and wants a letter giving reasons why she should not live alone.

## 2015-11-10 NOTE — Telephone Encounter (Signed)
Has multiple medical problems and is on numerous medications, has difficulty taking care of herself and memory has been waning. Can write supportive letter.

## 2015-11-12 NOTE — Telephone Encounter (Signed)
See letter written for pt,pt made aware

## 2015-11-21 ENCOUNTER — Other Ambulatory Visit: Payer: Self-pay | Admitting: Family Medicine

## 2015-11-24 ENCOUNTER — Encounter: Payer: Self-pay | Admitting: Family Medicine

## 2015-11-24 NOTE — Telephone Encounter (Signed)
This encounter was created in error - please disregard.

## 2015-11-24 NOTE — Telephone Encounter (Signed)
error 

## 2015-12-03 ENCOUNTER — Other Ambulatory Visit: Payer: Self-pay | Admitting: Family Medicine

## 2015-12-03 ENCOUNTER — Encounter: Payer: Self-pay | Admitting: Family Medicine

## 2015-12-03 NOTE — Telephone Encounter (Signed)
Medication refill for one time only.  Patient needs to be seen.  Letter sent for patient to call and schedule 

## 2015-12-08 ENCOUNTER — Telehealth: Payer: Self-pay | Admitting: Cardiology

## 2015-12-08 ENCOUNTER — Telehealth: Payer: Self-pay | Admitting: *Deleted

## 2015-12-08 ENCOUNTER — Encounter: Payer: Commercial Managed Care - HMO | Admitting: *Deleted

## 2015-12-08 NOTE — Telephone Encounter (Signed)
Spoke with pt and reminded pt of remote transmission that is due today. Pt verbalized understanding.   

## 2015-12-08 NOTE — Telephone Encounter (Signed)
Submitted humana referral thru acuity connect for authorization on 12/01/15 to Dr. 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

## 2015-12-12 ENCOUNTER — Encounter: Payer: Self-pay | Admitting: Cardiology

## 2015-12-12 ENCOUNTER — Encounter: Payer: Self-pay | Admitting: Family Medicine

## 2015-12-12 ENCOUNTER — Other Ambulatory Visit: Payer: Self-pay | Admitting: Family Medicine

## 2015-12-12 ENCOUNTER — Ambulatory Visit (INDEPENDENT_AMBULATORY_CARE_PROVIDER_SITE_OTHER): Payer: Commercial Managed Care - HMO | Admitting: Family Medicine

## 2015-12-12 VITALS — BP 120/74 | HR 82 | Temp 97.6°F | Resp 18 | Wt 200.0 lb

## 2015-12-12 DIAGNOSIS — I2510 Atherosclerotic heart disease of native coronary artery without angina pectoris: Secondary | ICD-10-CM | POA: Diagnosis not present

## 2015-12-12 DIAGNOSIS — N184 Chronic kidney disease, stage 4 (severe): Secondary | ICD-10-CM | POA: Diagnosis not present

## 2015-12-12 DIAGNOSIS — I10 Essential (primary) hypertension: Secondary | ICD-10-CM | POA: Diagnosis not present

## 2015-12-12 DIAGNOSIS — J438 Other emphysema: Secondary | ICD-10-CM | POA: Diagnosis not present

## 2015-12-12 DIAGNOSIS — E038 Other specified hypothyroidism: Secondary | ICD-10-CM

## 2015-12-12 NOTE — Progress Notes (Signed)
Subjective:    Patient ID: [name], female    DOB: 03/11/1946, 70 y.o.   MRN: [id number]  HPI 07/11/15 Patient is here today for an annual exam. She is due for a flu shot. She's had Pneumovax 23. She is due for Prevnar 13. She is also due for the shingles vaccine. Patient has a history of partial hysterectomy and therefore does not require Pap smears. Her last colonoscopy was in 2010 and is due again in 2015 due to the presence of a tubular adenoma. Therefore she is overdue for this. She is also overdue for a bone density test. Otherwise she is doing relatively well with no concerns.  At that time, my plan was: Patient's physical exam today is normal. Her blood pressure was acceptable. She received Prevnar 13 as well as the flu shot today. I recommended a mammogram as well as a colonoscopy. Patient refused both of these and does not want to proceed with any further cancer screening. She states emphatically that she would not want to treat even if she had it. I will check a bone density test to screen for osteopenia or osteoporosis. I will also check a CBC to monitor for anemia on her blood thinner. I'll check a fasting lipid panel to make sure her LDL cholesterol is less than 70. I will monitor her kidney function due to her chronic kidney disease. I will also check a TSH to monitor her treatment of hypothyroidism  12/12/15 Patient is here today for her regular follow-up. As stated below she has a significant history of coronary artery disease status post myocardial infarction 2 as well as stroke. She also has a history of hypothyroidism. She is here today for regular screening lab work to monitor her cholesterol, her TSH, as well as to check her kidney function given her history of chronic kidney disease. Otherwise the patient is doing relatively well. She complains of diffuse pain. It hurts to even touch her skin. Pain is out of proportion to exam. She also has a history of IBS and states that  she is having terrible diarrhea almost on a daily basis. In fact at times she has fecal incontinence. In the past I gave the patient Viberzi which worked wonders but her insurance would not cover it and the patient cannot afford it. She is not taking anything to try to stop the diarrhea. She denies any chest pain or shortness of breath. She is moving into an assisted living facility at the present time she is living alone and she is unable to drive. Therefore she feels rather trapped in her home. She is also isolated and lonely. Identically believe she will benefit from being in an assisted living facility  Past Medical History  Diagnosis Date  . Hypothyroid   . Stroke ([place])   . Syncope and collapse   . MI (myocardial infarction) ([place])     x2  . COPD (chronic obstructive pulmonary disease) ([place])   . Depression   . Insomnia   . Restless leg syndrome   . Coronary artery disease   . Bulging disc   . Chronic kidney disease, stage III (moderate)   . Macular degeneration   . Arteriosclerosis of coronary artery bypass graft   . Tachycardia   . Pacemaker     [employer]  . Esophageal reflux   . Unspecified hearing loss   . Mixed hyperlipidemia   . PVT (paroxysmal ventricular tachycardia) ([place])   . Anxiety   .  Blood transfusion without reported diagnosis     10/29/2006  . Cataract 05/2012  . Osteoporosis   . Chronic kidney disease, stage 3    Past Surgical History  Procedure Laterality Date  . Coronary artery bypass graft      x 4  . Cardiac catheterization  2007    x2;cone  . Cardiac catheterization  2008  . Cerebral angiogram    . Lumbar disc surgery    . Gallbladder surgery    . Partial hysterectomy    . Knee arthroscopy    . Cataract extraction Right   . Insert / replace / remove pacemaker  2013    [place], [place]; [employer]  . Colonoscopy    . Eye surgery  05/2013    cataract removed  . Spine surgery  1999/2001/2006    3 disc replaced   Current  Outpatient Prescriptions on File Prior to Visit  Medication Sig Dispense Refill  . albuterol (PROVENTIL HFA;VENTOLIN HFA) 108 (90 BASE) MCG/ACT inhaler Inhale 2 puffs into the lungs every 6 (six) hours as needed for wheezing or shortness of breath.    Marland Kitchen albuterol (PROVENTIL) (2.5 MG/3ML) 0.083% nebulizer solution Take 3 mLs (2.5 mg total) by nebulization every 4 (four) hours as needed for wheezing or shortness of breath. 150 mL 1  . ALPRAZolam (XANAX) 1 MG tablet Take 1 tablet (1 mg total) by mouth at bedtime as needed for anxiety. 30 tablet 3  . aspirin 81 MG tablet Take 81 mg by mouth daily.    Marland Kitchen atorvastatin (LIPITOR) 20 MG tablet TAKE 1 TABLET EVERY DAY 90 tablet 0  . clopidogrel (PLAVIX) 75 MG tablet Take 1 tablet (75 mg total) by mouth daily with breakfast. 30 tablet 0  . furosemide (LASIX) 20 MG tablet Alternate 20 mg / 40 mg daily . 45 tablet 6  . isosorbide mononitrate (IMDUR) 60 MG 24 hr tablet TAKE 1 TABLET  DAILY. 90 tablet 3  . levothyroxine (SYNTHROID, LEVOTHROID) 50 MCG tablet TAKE 1 TABLET DAILY BEFORE BREAKFAST. 90 tablet 0  . metoprolol (LOPRESSOR) 50 MG tablet Take 50 mg by mouth 2 (two) times daily.    . nitroGLYCERIN (NITROSTAT) 0.4 MG SL tablet Place 0.4 mg under the tongue every 5 (five) minutes as needed for chest pain.    . pantoprazole (PROTONIX) 40 MG tablet Take 1 tablet (40 mg total) by mouth daily. 90 tablet 3  . potassium chloride (K-DUR) 10 MEQ tablet Take 1 tablet by mouth daily.  0  . pramipexole (MIRAPEX) 1 MG tablet Take 1.5 tablets (1.5 mg total) by mouth daily. 135 tablet 3  . sertraline (ZOLOFT) 100 MG tablet TAKE 2 TABLETS AT BEDTIME 180 tablet 3  . traZODone (DESYREL) 100 MG tablet TAKE 2 TABLETS AT BEDTIME. 180 tablet 3  . Vitamin D, Ergocalciferol, (DRISDOL) 50000 UNITS CAPS capsule take 1 capsule by mouth every week FOR FOUR WEEKS, THEN ONE CAPSULE ONCE A MONTH FOR 6 MONTHS  0   No current facility-administered medications on file prior to visit.    Allergies  Allergen Reactions  . Codeine     swelling  . Iohexol      Desc: PT HAD CONTRAST REACTION YRS AGO NEEDS 13 HR PREP FOR ALL CONTRAST STUDIES    Social History   Social History  . Marital Status: Married    Spouse Name: N/A  . Number of Children: N/A  . Years of Education: N/A   Occupational History  . Not on  file.   Social History Main Topics  . Smoking status: Former Smoker -- 0.75 packs/day    Types: Cigarettes    Start date: 09/28/1965    Quit date: 05/27/2006  . Smokeless tobacco: Never Used  . Alcohol Use: No  . Drug Use: No  . Sexual Activity: Not Currently   Other Topics Concern  . Not on file   Social History Narrative   Family History  Problem Relation Age of Onset  . Stomach cancer Mother   . Diabetes Mother   . Hyperlipidemia Mother   . Hypertension Father   . Lung cancer Father   . Alcohol abuse Father   . Early death Brother       Review of Systems  All other systems reviewed and are negative.      Objective:   Physical Exam  Constitutional: She is oriented to person, place, and time. She appears well-developed and well-nourished. No distress.  HENT:  Head: Normocephalic and atraumatic.  Right Ear: External ear normal.  Left Ear: External ear normal.  Nose: Nose normal.  Mouth/Throat: Oropharynx is clear and moist. No oropharyngeal exudate.  Eyes: Conjunctivae and EOM are normal. Pupils are equal, round, and reactive to light. Right eye exhibits no discharge. Left eye exhibits no discharge. No scleral icterus.  Neck: Normal range of motion. Neck supple. No JVD present. No tracheal deviation present. No thyromegaly present.  Cardiovascular: Normal rate, regular rhythm, normal heart sounds and intact distal pulses.  Exam reveals no gallop and no friction rub.   No murmur heard. Pulmonary/Chest: Effort normal and breath sounds normal. No stridor. No respiratory distress. She has no wheezes. She has no rales. She exhibits no  tenderness.  Abdominal: Soft. Bowel sounds are normal. She exhibits no distension and no mass. There is no tenderness. There is no rebound and no guarding.  Musculoskeletal: Normal range of motion. She exhibits no edema or tenderness.  Lymphadenopathy:    She has no cervical adenopathy.  Neurological: She is alert and oriented to person, place, and time. She has normal reflexes. No cranial nerve deficit. She exhibits normal muscle tone. Coordination normal.  Skin: Skin is warm. No rash noted. She is not diaphoretic. No erythema. No pallor.  Psychiatric: She has a normal mood and affect. Her behavior is normal. Judgment and thought content normal.  Vitals reviewed.         Assessment & Plan:  Chronic kidney disease (CKD), stage IV (severe) (HCC)  Essential hypertension  Other emphysema (HCC)  Atherosclerosis of native coronary artery of native heart without angina pectoris - Plan: CBC with Differential/Platelet, COMPLETE METABOLIC PANEL WITH GFR, Lipid panel  Other specified hypothyroidism - Plan: TSH  Check a BMP today to monitor her renal function. I will also check LFTs to monitor her hepatic function on statin medication. I will check a fasting lipid panel to monitor her LDL cholesterol with a goal LDL cholesterol less than 70. Her emphysema stable and she denies any shortness of breath or wheezing. She denies any chest pain or angina. Her blood pressures well controlled. I will monitor her TSH to ensure that she is appropriately being treated for hypothyroidism. I recommended that she try 1-2 tablets of Imodium on a daily basis to help manage her IBS.

## 2015-12-13 LAB — CBC WITH DIFFERENTIAL/PLATELET
BASOS ABS: 0 10*3/uL (ref 0.0–0.1)
BASOS PCT: 0 % (ref 0–1)
EOS ABS: 0.2 10*3/uL (ref 0.0–0.7)
Eosinophils Relative: 2 % (ref 0–5)
HCT: 40.2 % (ref 36.0–46.0)
HEMOGLOBIN: 13.1 g/dL (ref 12.0–15.0)
Lymphocytes Relative: 22 % (ref 12–46)
Lymphs Abs: 1.7 10*3/uL (ref 0.7–4.0)
MCH: 27.5 pg (ref 26.0–34.0)
MCHC: 32.6 g/dL (ref 30.0–36.0)
MCV: 84.5 fL (ref 78.0–100.0)
MPV: 9.5 fL (ref 8.6–12.4)
Monocytes Absolute: 1 10*3/uL (ref 0.1–1.0)
Monocytes Relative: 13 % — ABNORMAL HIGH (ref 3–12)
NEUTROS ABS: 4.7 10*3/uL (ref 1.7–7.7)
NEUTROS PCT: 63 % (ref 43–77)
PLATELETS: 190 10*3/uL (ref 150–400)
RBC: 4.76 MIL/uL (ref 3.87–5.11)
RDW: 15.2 % (ref 11.5–15.5)
WBC: 7.5 10*3/uL (ref 4.0–10.5)

## 2015-12-13 LAB — COMPLETE METABOLIC PANEL WITH GFR
ALBUMIN: 4.5 g/dL (ref 3.6–5.1)
ALK PHOS: 141 U/L — AB (ref 33–130)
ALT: 16 U/L (ref 6–29)
AST: 18 U/L (ref 10–35)
BILIRUBIN TOTAL: 0.5 mg/dL (ref 0.2–1.2)
BUN: 17 mg/dL (ref 7–25)
CO2: 27 mmol/L (ref 20–31)
Calcium: 9.2 mg/dL (ref 8.6–10.4)
Chloride: 102 mmol/L (ref 98–110)
Creat: 1.31 mg/dL — ABNORMAL HIGH (ref 0.60–0.93)
GFR, Est African American: 48 mL/min — ABNORMAL LOW (ref >=60)
GFR, Est Non African American: 41 mL/min — ABNORMAL LOW (ref >=60)
GLUCOSE: 78 mg/dL (ref 70–99)
Potassium: 4 mmol/L (ref 3.5–5.3)
SODIUM: 140 mmol/L (ref 135–146)
TOTAL PROTEIN: 7.3 g/dL (ref 6.1–8.1)

## 2015-12-13 LAB — LIPID PANEL
CHOL/HDL RATIO: 1.6 ratio (ref <=5.0)
Cholesterol: 149 mg/dL (ref 125–200)
HDL: 94 mg/dL (ref >=46)
LDL Cholesterol: 44 mg/dL (ref <130)
Triglycerides: 54 mg/dL (ref <150)
VLDL: 11 mg/dL (ref <30)

## 2015-12-13 LAB — TSH: TSH: 5.21 m[IU]/L — AB

## 2015-12-16 LAB — GAMMA GT: GGT: 36 U/L (ref 7–51)

## 2015-12-23 ENCOUNTER — Other Ambulatory Visit: Payer: Self-pay | Admitting: Family Medicine

## 2015-12-23 DIAGNOSIS — M25561 Pain in right knee: Secondary | ICD-10-CM

## 2015-12-23 DIAGNOSIS — M25562 Pain in left knee: Secondary | ICD-10-CM

## 2015-12-23 DIAGNOSIS — M545 Low back pain, unspecified: Secondary | ICD-10-CM

## 2015-12-23 DIAGNOSIS — E038 Other specified hypothyroidism: Secondary | ICD-10-CM

## 2015-12-23 MED ORDER — LEVOTHYROXINE SODIUM 75 MCG PO TABS: 75.0000 ug | ORAL_TABLET | Freq: Every day | ORAL | Status: DC

## 2015-12-26 ENCOUNTER — Other Ambulatory Visit: Payer: Self-pay | Admitting: Family Medicine

## 2015-12-26 MED ORDER — LEVOTHYROXINE SODIUM 75 MCG PO TABS: 75.0000 ug | ORAL_TABLET | Freq: Every day | ORAL | Status: DC

## 2015-12-30 ENCOUNTER — Other Ambulatory Visit: Payer: Self-pay | Admitting: Family Medicine

## 2015-12-30 DIAGNOSIS — M25562 Pain in left knee: Principal | ICD-10-CM

## 2015-12-30 DIAGNOSIS — M545 Low back pain, unspecified: Secondary | ICD-10-CM

## 2015-12-30 DIAGNOSIS — M25561 Pain in right knee: Secondary | ICD-10-CM

## 2016-01-05 ENCOUNTER — Other Ambulatory Visit: Payer: Self-pay | Admitting: Cardiovascular Disease

## 2016-01-09 ENCOUNTER — Other Ambulatory Visit: Payer: Self-pay

## 2016-01-09 MED ORDER — METOPROLOL TARTRATE 25 MG PO TABS: 25.0000 mg | ORAL_TABLET | Freq: Two times a day (BID) | ORAL | Status: DC

## 2016-01-15 ENCOUNTER — Encounter: Payer: Self-pay | Admitting: Cardiovascular Disease

## 2016-01-27 ENCOUNTER — Other Ambulatory Visit: Payer: Self-pay | Admitting: Family Medicine

## 2016-01-27 NOTE — Telephone Encounter (Signed)
Refill appropriate and filled per protocol. 

## 2016-02-10 ENCOUNTER — Telehealth: Payer: Self-pay | Admitting: Family Medicine

## 2016-02-10 NOTE — Telephone Encounter (Signed)
[facility] [phone] Patient is calling to speak to you regarding his oxygen tank that she has, she has moved to assisted living and the one she has now is too big  Please call her at [phone] (H)

## 2016-02-11 NOTE — Telephone Encounter (Signed)
I called [employer].  They said they do not have a smaller concentrator.  As far as Back up cylinder goes.  They could replace with smaller refillable tanks, but that would only take up more room in her already small area.  Pt was informed.

## 2016-03-11 ENCOUNTER — Ambulatory Visit: Payer: Commercial Managed Care - HMO | Admitting: Cardiovascular Disease

## 2016-03-22 ENCOUNTER — Ambulatory Visit: Payer: Commercial Managed Care - HMO | Admitting: Cardiovascular Disease

## 2016-03-31 ENCOUNTER — Other Ambulatory Visit: Payer: Self-pay | Admitting: Family Medicine

## 2016-04-01 ENCOUNTER — Encounter: Payer: Self-pay | Admitting: Family Medicine

## 2016-04-12 ENCOUNTER — Encounter: Payer: Self-pay | Admitting: Cardiovascular Disease

## 2016-04-12 ENCOUNTER — Ambulatory Visit (INDEPENDENT_AMBULATORY_CARE_PROVIDER_SITE_OTHER): Payer: Medicare HMO | Admitting: Cardiovascular Disease

## 2016-04-12 VITALS — BP 128/72 | HR 71 | Ht 62.0 in | Wt 202.0 lb

## 2016-04-12 DIAGNOSIS — I10 Essential (primary) hypertension: Secondary | ICD-10-CM

## 2016-04-12 DIAGNOSIS — J438 Other emphysema: Secondary | ICD-10-CM

## 2016-04-12 DIAGNOSIS — I25768 Atherosclerosis of bypass graft of coronary artery of transplanted heart with other forms of angina pectoris: Secondary | ICD-10-CM

## 2016-04-12 DIAGNOSIS — Z45018 Encounter for adjustment and management of other part of cardiac pacemaker: Secondary | ICD-10-CM

## 2016-04-12 DIAGNOSIS — Z8673 Personal history of transient ischemic attack (TIA), and cerebral infarction without residual deficits: Secondary | ICD-10-CM

## 2016-04-12 DIAGNOSIS — E785 Hyperlipidemia, unspecified: Secondary | ICD-10-CM | POA: Diagnosis not present

## 2016-04-12 DIAGNOSIS — R079 Chest pain, unspecified: Secondary | ICD-10-CM

## 2016-04-12 MED ORDER — ISOSORBIDE MONONITRATE ER 60 MG PO TB24: 90.0000 mg | ORAL_TABLET | Freq: Every day | ORAL | Status: DC

## 2016-04-12 NOTE — Patient Instructions (Signed)
Medication Instructions:   Increase Imdur to 90mg  daily (1 1/2 tabs of the 60mg  tablet).   Patient will use current medication that she already has at home.  Will request refill as she needs it.   Continue all other medications.    Labwork: NONE  Testing/Procedures: NONE  Follow-Up: Your physician wants you to follow up in:  4 months.  You will receive a reminder letter in the mail one-two months in advance.  If you don't receive a letter, please call our office to schedule the follow up appointment   Any Other Special Instructions Will Be Listed Below (If Applicable).  If you need a refill on your cardiac medications before your next appointment, please call your pharmacy.

## 2016-04-12 NOTE — Progress Notes (Signed)
Patient ID: [name], female   DOB: 05-10-1946, 70 y.o.   MRN: [id number]      SUBJECTIVE: The patient presents for routine follow-up for coronary artery disease. She has a history of coronary artery bypass graft surgery, pacemaker, COPD, essential hypertension, hyperlipidemia, CKD and CVA. She had a normal nuclear myocardial perfusion study on 08/10/2013.  She has been having more frequent episodes of chest pain. One episode was alleviated with one nitroglycerin tablet last week and an additional episode was alleviated with 2 tablets. She also complains of frequent nosebleeds. She has balance issues and has frequent falls. She has a walker but has not been using it. She also has some degree of cognitive impairment. She is now in assisted living.  ECG performed in the office today which I personally interpreted demonstrated sinus rhythm with an incomplete right bundle-branch block.  Review of Systems: As per "subjective", otherwise negative.  Allergies  Allergen Reactions  . Codeine     swelling  . Iohexol      Desc: PT HAD CONTRAST REACTION YRS AGO NEEDS 13 HR PREP FOR ALL CONTRAST STUDIES     Current Outpatient Prescriptions  Medication Sig Dispense Refill  . albuterol (PROVENTIL HFA;VENTOLIN HFA) 108 (90 BASE) MCG/ACT inhaler Inhale 2 puffs into the lungs every 6 (six) hours as needed for wheezing or shortness of breath.    Marland Kitchen albuterol (PROVENTIL) (2.5 MG/3ML) 0.083% nebulizer solution Take 3 mLs (2.5 mg total) by nebulization every 4 (four) hours as needed for wheezing or shortness of breath. 150 mL 1  . aspirin 81 MG tablet Take 81 mg by mouth daily.    Marland Kitchen atorvastatin (LIPITOR) 20 MG tablet TAKE 1 TABLET EVERY DAY 90 tablet 0  . clopidogrel (PLAVIX) 75 MG tablet TAKE 1 TABLET EVERY DAY WITH BREAKFAST 90 tablet 3  . furosemide (LASIX) 20 MG tablet Alternate 20 mg / 40 mg daily . 45 tablet 6  . isosorbide mononitrate (IMDUR) 60 MG 24 hr tablet TAKE 1 TABLET  DAILY. 90 tablet  3  . levothyroxine (SYNTHROID, LEVOTHROID) 75 MCG tablet TAKE 1 TABLET EVERY DAY  (NEW  DOSE) 90 tablet 0  . metoprolol tartrate (LOPRESSOR) 25 MG tablet Take 1 tablet (25 mg total) by mouth 2 (two) times daily. 180 tablet 3  . nitroGLYCERIN (NITROSTAT) 0.4 MG SL tablet Place 0.4 mg under the tongue every 5 (five) minutes as needed for chest pain.    . pantoprazole (PROTONIX) 40 MG tablet Take 1 tablet (40 mg total) by mouth daily. 90 tablet 3  . pramipexole (MIRAPEX) 1 MG tablet Take 1.5 tablets (1.5 mg total) by mouth daily. 135 tablet 3  . sertraline (ZOLOFT) 100 MG tablet TAKE 2 TABLETS AT BEDTIME 180 tablet 3  . traZODone (DESYREL) 100 MG tablet TAKE 2 TABLETS AT BEDTIME. 180 tablet 3  . Vitamin D, Ergocalciferol, (DRISDOL) 50000 UNITS CAPS capsule take 1 capsule by mouth every week FOR FOUR WEEKS, THEN ONE CAPSULE ONCE A MONTH FOR 6 MONTHS  0   No current facility-administered medications for this visit.    Past Medical History  Diagnosis Date  . Hypothyroid   . Stroke ([place])   . Syncope and collapse   . MI (myocardial infarction) ([place])     x2  . COPD (chronic obstructive pulmonary disease) ([place])   . Depression   . Insomnia   . Restless leg syndrome   . Coronary artery disease   . Bulging disc   . Chronic kidney  disease, stage III (moderate)   . Macular degeneration   . Arteriosclerosis of coronary artery bypass graft   . Tachycardia   . Pacemaker     [employer]  . Esophageal reflux   . Unspecified hearing loss   . Mixed hyperlipidemia   . PVT (paroxysmal ventricular tachycardia) ([place])   . Anxiety   . Blood transfusion without reported diagnosis     10/29/2006  . Cataract 05/2012  . Osteoporosis   . Chronic kidney disease, stage 3     Past Surgical History  Procedure Laterality Date  . Coronary artery bypass graft      x 4  . Cardiac catheterization  2007    x2;cone  . Cardiac catheterization  2008  . Cerebral angiogram    . Lumbar disc surgery    .  Gallbladder surgery    . Partial hysterectomy    . Knee arthroscopy    . Cataract extraction Right   . Insert / replace / remove pacemaker  2013    [place], [place]; [employer]  . Colonoscopy    . Eye surgery  05/2013    cataract removed  . Spine surgery  1999/2001/2006    3 disc replaced    Social History   Social History  . Marital Status: Married    Spouse Name: N/A  . Number of Children: N/A  . Years of Education: N/A   Occupational History  . Not on file.   Social History Main Topics  . Smoking status: Former Smoker -- 0.75 packs/day    Types: Cigarettes    Start date: 09/28/1965    Quit date: 05/27/2006  . Smokeless tobacco: Never Used  . Alcohol Use: No  . Drug Use: No  . Sexual Activity: Not Currently   Other Topics Concern  . Not on file   Social History Narrative     Filed Vitals:   04/12/16 1527  BP: 128/72  Pulse: 71  Height: 5\' 2"  (1.575 m)  Weight: 202 lb (91.627 kg)  SpO2: 98%    PHYSICAL EXAM General: NAD HEENT: Normal. Neck: No JVD, no thyromegaly. Lungs: Clear to auscultation bilaterally with normal respiratory effort. CV: Nondisplaced PMI.  Regular rate and rhythm, normal S1/S2, no S3/S4, no murmur. No pretibial or periankle edema.  No carotid bruit.   Abdomen: Soft, obese.  Neurologic: Alert and oriented.  Psych: Normal affect. Skin: Normal. Musculoskeletal: No gross deformities.    ECG: Most recent ECG reviewed.       ASSESSMENT AND PLAN: 1. Chest pain in context of CAD s/p CABG: Given more frequent anginal episodes, will increase Imdur to 90 mg. Nuclear myocardial perfusion study normal in November 2014. Continue aspirin, metoprolol, and statin therapy.  2. Essential HTN: Well controlled on current therapy. No changes.  3. Hyperlipidemia: HDL 94, LDL 44 on 12/12/15 . Continue low intensity statin therapy with Lipitor 20 mg daily.  4. CVA: She is on both aspirin and Plavix. Stable, no changes.  5. COPD:  Continue therapy with albuterol inhaler as needed. She quit smoking several years ago.  6. Pacemaker: Follows with EP. Stable. Continue metoprolol 25 mg twice daily.   7. CKD stage 3: Follows with renal.  8. Bilateral lower extremity edema: Continue Lasix 40 mg and 20 mg on alternate days.   Dispo: f/u 4 months.   Kate Sable, M.D., F.A.C.C.

## 2016-05-10 ENCOUNTER — Telehealth: Payer: Self-pay | Admitting: Internal Medicine

## 2016-05-10 NOTE — Telephone Encounter (Signed)
Patient needs to speak with someone in device clinic. / tg

## 2016-05-10 NOTE — Telephone Encounter (Signed)
[name] has moved to [facility] in [place]. She does not have a landline, she received a cellular adapter from [employer] for her home PPM monitor. She will attempt a transmission now. Follow up with Dr. Lovena Le in [facility] in December 2017.

## 2016-05-13 ENCOUNTER — Telehealth: Payer: Self-pay | Admitting: Cardiovascular Disease

## 2016-05-13 NOTE — Telephone Encounter (Signed)
Mrs. Gent is leaving at the [facility].  The director Mrs. Jones called today stating that the isosorbide mononitrate (IMDUR) 60 MG 24  Is making patient sick. States that she continues to have some palpitations. Please call the [facility] [phone]

## 2016-05-13 NOTE — Telephone Encounter (Signed)
Last seen by Dr. Bronson Ing on 04/12/2016.  Per [name] & patient, she received letter stating she needed to schedule appointment.  Explained to them that was her recall letter - due for 4 month follow up this November.  She has continued on her 90mg  daily of the Imdur as suggested at last OV.  Nothing new, chest pain is the same as always.  Informed director & patient to call office if anything new comes up.  She verbalized understanding.

## 2016-05-14 ENCOUNTER — Ambulatory Visit (INDEPENDENT_AMBULATORY_CARE_PROVIDER_SITE_OTHER): Payer: Medicare HMO | Admitting: *Deleted

## 2016-05-14 DIAGNOSIS — Z45018 Encounter for adjustment and management of other part of cardiac pacemaker: Secondary | ICD-10-CM | POA: Diagnosis not present

## 2016-05-14 DIAGNOSIS — I495 Sick sinus syndrome: Secondary | ICD-10-CM

## 2016-05-18 ENCOUNTER — Encounter: Payer: Self-pay | Admitting: Cardiology

## 2016-05-18 NOTE — Progress Notes (Signed)
Remote pacemaker transmission.   

## 2016-05-31 ENCOUNTER — Other Ambulatory Visit: Payer: Self-pay | Admitting: Family Medicine

## 2016-06-01 LAB — CUP PACEART REMOTE DEVICE CHECK
Brady Statistic RA Percent Paced: 99 %
Brady Statistic RV Percent Paced: 13 %
Date Time Interrogation Session: 20170818174100
Implantable Lead Implant Date: 20130611
Implantable Lead Model: 5076
Lead Channel Setting Pacing Amplitude: 2 V
Lead Channel Setting Pacing Pulse Width: 0.4 ms
Lead Channel Setting Sensing Sensitivity: 2.5 mV
MDC IDC LEAD IMPLANT DT: 20130611
MDC IDC LEAD LOCATION: 753859
MDC IDC LEAD LOCATION: 753860
MDC IDC LEAD SERIAL: 566580
MDC IDC MSMT BATTERY REMAINING LONGEVITY: 66 mo
MDC IDC MSMT BATTERY REMAINING PERCENTAGE: 98 %
MDC IDC MSMT LEADCHNL RA IMPEDANCE VALUE: 588 Ohm
MDC IDC MSMT LEADCHNL RV IMPEDANCE VALUE: 431 Ohm
MDC IDC SET LEADCHNL RV PACING AMPLITUDE: 2.4 V
Pulse Gen Serial Number: 131914

## 2016-07-19 ENCOUNTER — Other Ambulatory Visit: Payer: Self-pay | Admitting: Family Medicine

## 2016-08-06 ENCOUNTER — Other Ambulatory Visit: Payer: Self-pay | Admitting: Family Medicine

## 2016-08-06 ENCOUNTER — Other Ambulatory Visit: Payer: Self-pay | Admitting: Cardiovascular Disease

## 2016-08-18 ENCOUNTER — Encounter: Payer: Self-pay | Admitting: *Deleted

## 2016-08-23 ENCOUNTER — Encounter: Payer: Self-pay | Admitting: Cardiovascular Disease

## 2016-08-23 ENCOUNTER — Ambulatory Visit (INDEPENDENT_AMBULATORY_CARE_PROVIDER_SITE_OTHER): Payer: Medicare HMO | Admitting: Cardiovascular Disease

## 2016-08-23 VITALS — BP 120/64 | HR 70 | Ht 62.0 in | Wt 203.0 lb

## 2016-08-23 DIAGNOSIS — I10 Essential (primary) hypertension: Secondary | ICD-10-CM | POA: Diagnosis not present

## 2016-08-23 DIAGNOSIS — Z8673 Personal history of transient ischemic attack (TIA), and cerebral infarction without residual deficits: Secondary | ICD-10-CM | POA: Diagnosis not present

## 2016-08-23 DIAGNOSIS — R600 Localized edema: Secondary | ICD-10-CM

## 2016-08-23 DIAGNOSIS — E7800 Pure hypercholesterolemia, unspecified: Secondary | ICD-10-CM

## 2016-08-23 DIAGNOSIS — I495 Sick sinus syndrome: Secondary | ICD-10-CM

## 2016-08-23 DIAGNOSIS — Z45018 Encounter for adjustment and management of other part of cardiac pacemaker: Secondary | ICD-10-CM

## 2016-08-23 DIAGNOSIS — I25768 Atherosclerosis of bypass graft of coronary artery of transplanted heart with other forms of angina pectoris: Secondary | ICD-10-CM | POA: Diagnosis not present

## 2016-08-23 NOTE — Patient Instructions (Signed)

## 2016-08-23 NOTE — Progress Notes (Signed)
SUBJECTIVE: The patient presents for routine follow-up for coronary artery disease. She has a history of coronary artery bypass graft surgery, pacemaker, COPD, essential hypertension, hyperlipidemia, CKD and CVA. She had a normal nuclear myocardial perfusion study on 08/10/2013.  Chest pain has been under control and has only had to use one nitroglycerin tablet since her last visit. She gets very little physical activity and walks with a cane due to balance problems. She has initiated arts and crafts activities at her retirement home.   Review of Systems: As per "subjective", otherwise negative.  Allergies  Allergen Reactions  . Codeine     swelling  . Iohexol      Desc: PT HAD CONTRAST REACTION YRS AGO NEEDS 13 HR PREP FOR ALL CONTRAST STUDIES     Current Outpatient Prescriptions  Medication Sig Dispense Refill  . aspirin 81 MG tablet Take 81 mg by mouth daily.    Marland Kitchen atorvastatin (LIPITOR) 20 MG tablet TAKE 1 TABLET EVERY DAY 90 tablet 0  . clopidogrel (PLAVIX) 75 MG tablet TAKE 1 TABLET EVERY DAY WITH BREAKFAST 90 tablet 3  . diphenhydrAMINE (BENADRYL) 25 MG tablet Take 50 mg by mouth at bedtime as needed.    . Ferrous Sulfate (IRON) 325 (65 Fe) MG TABS Take 1 tablet by mouth daily.    . folic acid (FOLVITE) Q000111Q MCG tablet Take 800 mcg by mouth daily.    . furosemide (LASIX) 20 MG tablet Alternate 20 mg / 40 mg daily . (Patient taking differently: Take 20 mg by mouth daily as needed. ) 45 tablet 6  . isosorbide mononitrate (IMDUR) 60 MG 24 hr tablet TAKE 1 TABLET EVERY DAY 90 tablet 3  . levothyroxine (SYNTHROID, LEVOTHROID) 75 MCG tablet TAKE 1 TABLET EVERY DAY  (NEW  DOSE) 90 tablet 2  . Magnesium 250 MG TABS Take 1 tablet by mouth daily.    . Melatonin 10 MG TABS Take 20 mg by mouth at bedtime.    . metoprolol tartrate (LOPRESSOR) 25 MG tablet Take 1 tablet (25 mg total) by mouth 2 (two) times daily. 180 tablet 3  . nitroGLYCERIN (NITROSTAT) 0.4 MG SL tablet Place 0.4 mg  under the tongue every 5 (five) minutes as needed for chest pain.    . pantoprazole (PROTONIX) 40 MG tablet TAKE 1 TABLET EVERY DAY 90 tablet 3  . pramipexole (MIRAPEX) 1 MG tablet TAKE 1 AND 1/2 TABLETS EVERY DAY 135 tablet 3  . sertraline (ZOLOFT) 100 MG tablet TAKE 2 TABLETS AT BEDTIME 180 tablet 3  . traZODone (DESYREL) 100 MG tablet TAKE 2 TABLETS AT BEDTIME. 180 tablet 3  . albuterol (PROVENTIL HFA;VENTOLIN HFA) 108 (90 BASE) MCG/ACT inhaler Inhale 2 puffs into the lungs every 6 (six) hours as needed for wheezing or shortness of breath.    Marland Kitchen albuterol (PROVENTIL) (2.5 MG/3ML) 0.083% nebulizer solution Take 3 mLs (2.5 mg total) by nebulization every 4 (four) hours as needed for wheezing or shortness of breath. 150 mL 1  . Vitamin D, Ergocalciferol, (DRISDOL) 50000 UNITS CAPS capsule take 1 capsule by mouth every week FOR FOUR WEEKS, THEN ONE CAPSULE ONCE A MONTH FOR 6 MONTHS  0   No current facility-administered medications for this visit.     Past Medical History:  Diagnosis Date  . Anxiety   . Arteriosclerosis of coronary artery bypass graft   . Blood transfusion without reported diagnosis    10/29/2006  . Bulging disc   . Cataract 05/2012  .  Chronic kidney disease, stage 3   . Chronic kidney disease, stage III (moderate)   . COPD (chronic obstructive pulmonary disease) ([place])   . Coronary artery disease   . Depression   . Esophageal reflux   . Hypothyroid   . Insomnia   . Macular degeneration   . MI (myocardial infarction)    x2  . Mixed hyperlipidemia   . Osteoporosis   . Pacemaker    [employer]  . PVT (paroxysmal ventricular tachycardia) ([place])   . Restless leg syndrome   . Stroke ([place])   . Syncope and collapse   . Tachycardia   . Unspecified hearing loss     Past Surgical History:  Procedure Laterality Date  . CARDIAC CATHETERIZATION  2007   x2;cone  . CARDIAC CATHETERIZATION  2008  . CATARACT EXTRACTION Right   . CEREBRAL ANGIOGRAM    . colonoscopy     . CORONARY ARTERY BYPASS GRAFT     x 4  . EYE SURGERY  05/2013   cataract removed  . GALLBLADDER SURGERY    . INSERT / REPLACE / REMOVE PACEMAKER  2013   [place], [place]; [employer]  . KNEE ARTHROSCOPY    . LUMBAR DISC SURGERY    . PARTIAL HYSTERECTOMY    . SPINE SURGERY  1999/2001/2006   3 disc replaced    Social History   Social History  . Marital status: Married    Spouse name: N/A  . Number of children: N/A  . Years of education: N/A   Occupational History  . Not on file.   Social History Main Topics  . Smoking status: Former Smoker    Packs/day: 0.75    Years: 40.00    Types: Cigarettes    Start date: 09/28/1965    Quit date: 05/27/2006  . Smokeless tobacco: Never Used  . Alcohol use No  . Drug use: No  . Sexual activity: Not Currently   Other Topics Concern  . Not on file   Social History Narrative  . No narrative on file     Vitals:   08/23/16 1345  Weight: 203 lb (92.1 kg)  Height: 5\' 2"  (1.575 m)   BP: 120/64 HR: 70 O2: 97%  PHYSICAL EXAM General: NAD HEENT: Normal. Neck: No JVD, no thyromegaly. Lungs: Clear to auscultation bilaterally with normal respiratory effort. CV: Nondisplaced PMI.  Regular rate and rhythm, normal S1/S2, no S3/S4, no murmur. No pretibial or periankle edema.  No carotid bruit.   Abdomen: Soft, obese.  Neurologic: Alert and oriented.  Psych: Normal affect. Skin: Normal. Musculoskeletal: No gross deformities.    ECG: Most recent ECG reviewed.      ASSESSMENT AND PLAN: 1. CAD s/p CABG: Symptomatically stable. Nuclear myocardial perfusion study normal in November 2014. Continue aspirin, metoprolol, Imdur, and statin.  2. Essential HTN: Well controlled on current therapy. No changes.  3. Hyperlipidemia: HDL 94, LDL 44 on 12/12/15 . Continue low intensity statin therapy with Lipitor 20 mg daily.  4. CVA: She is on both aspirin and Plavix. Stable, no changes.  5. Pacemaker: Follows with EP. Stable.  Continue metoprolol 25 mg twice daily.  6. Bilateral lower extremity edema: Continue Lasix 40 mg and 20 mg on alternate days.   Dispo: f/u 6 months.   Kate Sable, M.D., F.A.C.C.

## 2016-09-10 ENCOUNTER — Ambulatory Visit (INDEPENDENT_AMBULATORY_CARE_PROVIDER_SITE_OTHER): Payer: Medicare HMO | Admitting: Internal Medicine

## 2016-09-10 ENCOUNTER — Encounter: Payer: Self-pay | Admitting: Internal Medicine

## 2016-09-10 VITALS — BP 130/80 | HR 80 | Ht 62.0 in | Wt 207.0 lb

## 2016-09-10 DIAGNOSIS — I25768 Atherosclerosis of bypass graft of coronary artery of transplanted heart with other forms of angina pectoris: Secondary | ICD-10-CM | POA: Diagnosis not present

## 2016-09-10 DIAGNOSIS — I495 Sick sinus syndrome: Secondary | ICD-10-CM | POA: Diagnosis not present

## 2016-09-10 LAB — CUP PACEART INCLINIC DEVICE CHECK
Implantable Lead Implant Date: 20130611
Implantable Lead Location: 753859
Implantable Lead Location: 753860
Implantable Lead Model: 4469
Implantable Lead Model: 5076
Lead Channel Impedance Value: 419 Ohm
Lead Channel Impedance Value: 615 Ohm
Lead Channel Pacing Threshold Amplitude: 0.9 V
Lead Channel Pacing Threshold Pulse Width: 0.4 ms
Lead Channel Sensing Intrinsic Amplitude: 1.8 mV
Lead Channel Setting Pacing Amplitude: 2 V
Lead Channel Setting Pacing Pulse Width: 0.4 ms
MDC IDC LEAD IMPLANT DT: 20130611
MDC IDC LEAD SERIAL: 566580
MDC IDC MSMT LEADCHNL RA PACING THRESHOLD AMPLITUDE: 0.5 V
MDC IDC MSMT LEADCHNL RA PACING THRESHOLD PULSEWIDTH: 0.4 ms
MDC IDC MSMT LEADCHNL RV SENSING INTR AMPL: 3.8 mV
MDC IDC PG IMPLANT DT: 20130611
MDC IDC PG SERIAL: 131914
MDC IDC SESS DTM: 20171215050000
MDC IDC SET LEADCHNL RV PACING AMPLITUDE: 2.4 V
MDC IDC SET LEADCHNL RV SENSING SENSITIVITY: 2.5 mV

## 2016-09-10 NOTE — Patient Instructions (Signed)
Your physician wants you to follow-up in: 1 Year with Dr. Lovena Le. You will receive a reminder letter in the mail two months in advance. If you don't receive a letter, please call our office to schedule the follow-up appointment.  Remote monitoring is used to monitor your Pacemaker of ICD from home. This monitoring reduces the number of office visits required to check your device to one time per year. It allows [place] to keep an eye on the functioning of your device to ensure it is working properly. You are scheduled for a device check from home on 12/14/15. You may send your transmission at any time that day. If you have a wireless device, the transmission will be sent automatically. After your physician reviews your transmission, you will receive a postcard with your next transmission date.  Your physician recommends that you continue on your current medications as directed. Please refer to the Current Medication list given to you today.  If you need a refill on your cardiac medications before your next appointment, please call your pharmacy.  Thank you for choosing [facility]!

## 2016-09-10 NOTE — Progress Notes (Signed)
HPI [name] returns today for followup. She has an extensive medical history including CAD, s/p CABG 2008, HTN, symptomatic bradycardia s/p PPM, and dyslipidemia. No chest pain or sob. No edema. No syncope. She has become increasingly sedentary over the years. She has moved to an assisted living center in [place] as she has developed macular degeneration and lost much of her vision.  Allergies  Allergen Reactions  . Codeine     swelling  . Iohexol      Desc: PT HAD CONTRAST REACTION YRS AGO NEEDS 13 HR PREP FOR ALL CONTRAST STUDIES      Current Outpatient Prescriptions  Medication Sig Dispense Refill  . aspirin 81 MG tablet Take 81 mg by mouth daily.    Marland Kitchen atorvastatin (LIPITOR) 20 MG tablet TAKE 1 TABLET EVERY DAY 90 tablet 0  . clopidogrel (PLAVIX) 75 MG tablet TAKE 1 TABLET EVERY DAY WITH BREAKFAST 90 tablet 3  . diphenhydrAMINE (BENADRYL) 25 MG tablet Take 50 mg by mouth at bedtime as needed.    . Ferrous Sulfate (IRON) 325 (65 Fe) MG TABS Take 1 tablet by mouth daily.    . folic acid (FOLVITE) Q000111Q MCG tablet Take 800 mcg by mouth daily.    . furosemide (LASIX) 20 MG tablet Alternate 20 mg / 40 mg daily . (Patient taking differently: Take 20 mg by mouth daily as needed. ) 45 tablet 6  . isosorbide mononitrate (IMDUR) 60 MG 24 hr tablet TAKE 1 TABLET EVERY DAY 90 tablet 3  . levothyroxine (SYNTHROID, LEVOTHROID) 75 MCG tablet TAKE 1 TABLET EVERY DAY  (NEW  DOSE) 90 tablet 2  . Magnesium 250 MG TABS Take 1 tablet by mouth daily.    . Melatonin 10 MG TABS Take 20 mg by mouth at bedtime.    . metoprolol tartrate (LOPRESSOR) 25 MG tablet Take 1 tablet (25 mg total) by mouth 2 (two) times daily. 180 tablet 3  . nitroGLYCERIN (NITROSTAT) 0.4 MG SL tablet Place 0.4 mg under the tongue every 5 (five) minutes as needed for chest pain.    . pantoprazole (PROTONIX) 40 MG tablet TAKE 1 TABLET EVERY DAY 90 tablet 3  . pramipexole (MIRAPEX) 1 MG tablet TAKE 1 AND 1/2 TABLETS EVERY DAY  135 tablet 3  . sertraline (ZOLOFT) 100 MG tablet TAKE 2 TABLETS AT BEDTIME 180 tablet 3  . traZODone (DESYREL) 100 MG tablet TAKE 2 TABLETS AT BEDTIME. 180 tablet 3   No current facility-administered medications for this visit.      Past Medical History:  Diagnosis Date  . Anxiety   . Arteriosclerosis of coronary artery bypass graft   . Blood transfusion without reported diagnosis    10/29/2006  . Bulging disc   . Cataract 05/2012  . Chronic kidney disease, stage 3   . Chronic kidney disease, stage III (moderate)   . COPD (chronic obstructive pulmonary disease) ([place])   . Coronary artery disease   . Depression   . Esophageal reflux   . Hypothyroid   . Insomnia   . Macular degeneration   . MI (myocardial infarction)    x2  . Mixed hyperlipidemia   . Osteoporosis   . Pacemaker    [employer]  . PVT (paroxysmal ventricular tachycardia) ([place])   . Restless leg syndrome   . Stroke ([place])   . Syncope and collapse   . Tachycardia   . Unspecified hearing loss     ROS:   All systems reviewed  and negative except as noted in the HPI.   Past Surgical History:  Procedure Laterality Date  . CARDIAC CATHETERIZATION  2007   x2;cone  . CARDIAC CATHETERIZATION  2008  . CATARACT EXTRACTION Right   . CEREBRAL ANGIOGRAM    . colonoscopy    . CORONARY ARTERY BYPASS GRAFT     x 4  . EYE SURGERY  05/2013   cataract removed  . GALLBLADDER SURGERY    . INSERT / REPLACE / REMOVE PACEMAKER  2013   [place], [place]; [employer]  . KNEE ARTHROSCOPY    . LUMBAR DISC SURGERY    . PARTIAL HYSTERECTOMY    . SPINE SURGERY  1999/2001/2006   3 disc replaced     Family History  Problem Relation Age of Onset  . Stomach cancer Mother   . Diabetes Mother   . Hyperlipidemia Mother   . Hypertension Father   . Lung cancer Father   . Alcohol abuse Father   . Early death Brother      Social History   Social History  . Marital status: Married    Spouse name: N/A  .  Number of children: N/A  . Years of education: N/A   Occupational History  . Not on file.   Social History Main Topics  . Smoking status: Former Smoker    Packs/day: 0.75    Years: 40.00    Types: Cigarettes    Start date: 09/28/1965    Quit date: 05/27/2006  . Smokeless tobacco: Never Used  . Alcohol use No  . Drug use: No  . Sexual activity: Not Currently   Other Topics Concern  . Not on file   Social History Narrative  . No narrative on file     BP 130/80   Pulse 80   Ht 5\' 2"  (1.575 m)   Wt 207 lb (93.9 kg)   BMI 37.86 kg/m   Physical Exam:  obese appearing 70 yo woman, NAD HEENT: Unremarkable Neck:  6 cm JVD, no thyromegally Lymphatics:  No adenopathy Back:  No CVA tenderness Lungs:  Clear with no wheezes HEART:  Regular rate rhythm, no murmurs, no rubs, no clicks Abd:  soft, positive bowel sounds, no organomegally, no rebound, no guarding Ext:  2 plus pulses, trace edema in right lowere leg, no cyanosis, no clubbing Skin:  No rashes no nodules Neuro:  CN II through XII intact, motor grossly intact   DEVICE  Normal device function.  See PaceArt for details.   Assess/Plan: 1. CAD, s/p CABG - she denies anginal symptoms. She will continue her current meds. 2. HTN - her blood pressure is reasonably well controlled. Will follow. 3. PPM - her [employer] DDD PM is working normally. Will recheck in several months.  Mikle Bosworth.D.

## 2016-12-13 ENCOUNTER — Encounter: Payer: Medicare HMO | Admitting: *Deleted

## 2016-12-13 ENCOUNTER — Telehealth: Payer: Self-pay | Admitting: Cardiology

## 2016-12-13 NOTE — Telephone Encounter (Signed)
Spoke with pt and reminded pt of remote transmission that is due today. Pt verbalized that she recently moved to [facility] and she will be transferring her care to a [place] MD. She already has an appt w/ a primary cardiologist.

## 2016-12-15 ENCOUNTER — Other Ambulatory Visit: Payer: Self-pay | Admitting: Family Medicine

## 2016-12-15 ENCOUNTER — Other Ambulatory Visit: Payer: Self-pay | Admitting: Cardiovascular Disease

## 2016-12-17 ENCOUNTER — Encounter: Payer: Self-pay | Admitting: Cardiology

## 2017-01-10 ENCOUNTER — Telehealth: Payer: Self-pay

## 2017-01-10 NOTE — Telephone Encounter (Signed)
SENT NOTES TO SCHEDULING 

## 2017-02-01 ENCOUNTER — Ambulatory Visit (INDEPENDENT_AMBULATORY_CARE_PROVIDER_SITE_OTHER): Payer: Medicare HMO | Admitting: Cardiology

## 2017-02-01 ENCOUNTER — Encounter: Payer: Self-pay | Admitting: Cardiology

## 2017-02-01 VITALS — BP 104/50 | HR 70 | Ht 62.0 in | Wt 197.4 lb

## 2017-02-01 DIAGNOSIS — Z45018 Encounter for adjustment and management of other part of cardiac pacemaker: Secondary | ICD-10-CM | POA: Diagnosis not present

## 2017-02-01 DIAGNOSIS — R600 Localized edema: Secondary | ICD-10-CM

## 2017-02-01 DIAGNOSIS — I10 Essential (primary) hypertension: Secondary | ICD-10-CM

## 2017-02-01 DIAGNOSIS — I2510 Atherosclerotic heart disease of native coronary artery without angina pectoris: Secondary | ICD-10-CM

## 2017-02-01 DIAGNOSIS — I252 Old myocardial infarction: Secondary | ICD-10-CM | POA: Diagnosis not present

## 2017-02-01 DIAGNOSIS — Z8673 Personal history of transient ischemic attack (TIA), and cerebral infarction without residual deficits: Secondary | ICD-10-CM | POA: Diagnosis not present

## 2017-02-01 DIAGNOSIS — I209 Angina pectoris, unspecified: Secondary | ICD-10-CM | POA: Diagnosis not present

## 2017-02-01 NOTE — Progress Notes (Signed)
Cardiology Office Note:    Date:  02/01/2017   ID:  [name], DOB [birth year], MRN [id number]  PCP:  Marry Guan  Cardiologist: Dr. Bronson Ing (previously)  Candee Furbish, MD    Referring MD: Monico Blitz, MD   Chief Complaint  Patient presents with  . New Patient (Initial Visit)    arteriosclerotic CAD    History of Present Illness:    [name] is a 71 y.o. female here for the evaluation of coronary atherosclerosis.   Moved to [facility] in [place].   She saw Dr. Bronson Ing last on 08/23/16 (but drive was too far at this point) as well as Dr. Lovena Le in 09/10/16. She would like to consolidate her care here in [place].  Has history of CAD/CABG, HTN, HL, COPD, CVA, and pacemaker.  NUC 08/10/2013 - low risk, no ischemia  Rare NTG use. Balance issues, cane/ walker.   Left kidney small, non functioning well.   Overall she is doing quite well. She has lost some weight. She is using walker. She does get short of breath with activity. Deconditioning. Continue to promote movement.  Denies syncope, orthopnea, PND, bleeding.  Past Medical History:  Diagnosis Date  . Anxiety   . Arteriosclerosis of coronary artery bypass graft   . Blood transfusion without reported diagnosis    10/29/2006  . Bulging disc   . Cataract 05/2012  . Chronic kidney disease, stage 3   . Chronic kidney disease, stage III (moderate)   . COPD (chronic obstructive pulmonary disease) ([place])   . Coronary artery disease   . Depression   . Esophageal reflux   . Hypothyroid   . Insomnia   . Macular degeneration   . MI (myocardial infarction) ([place])    x2  . Mixed hyperlipidemia   . Osteoporosis   . Pacemaker    [employer]  . PVT (paroxysmal ventricular tachycardia) ([place])   . Restless leg syndrome   . Stroke ([place])   . Syncope and collapse   . Tachycardia   . Unspecified hearing loss     Past Surgical History:  Procedure Laterality Date  . CARDIAC CATHETERIZATION  2007    x2;cone  . CARDIAC CATHETERIZATION  2008  . CATARACT EXTRACTION Right   . CEREBRAL ANGIOGRAM    . colonoscopy    . CORONARY ARTERY BYPASS GRAFT     x 4  . EYE SURGERY  05/2013   cataract removed  . GALLBLADDER SURGERY    . INSERT / REPLACE / REMOVE PACEMAKER  2013   [place], [place]; [employer]  . KNEE ARTHROSCOPY    . LUMBAR DISC SURGERY    . PARTIAL HYSTERECTOMY    . SPINE SURGERY  1999/2001/2006   3 disc replaced    Current Medications: Current Meds  Medication Sig  . ALPRAZolam (XANAX) 1 MG tablet Take 1 tablet by mouth at bedtime.  Marland Kitchen aspirin 81 MG tablet Take 81 mg by mouth daily.  Marland Kitchen atorvastatin (LIPITOR) 20 MG tablet Take 20 mg by mouth daily.  . clopidogrel (PLAVIX) 75 MG tablet Take 75 mg by mouth daily.  . diphenhydrAMINE (BENADRYL) 25 MG tablet Take 50 mg by mouth at bedtime as needed for sleep.   . Ferrous Sulfate (IRON) 325 (65 Fe) MG TABS Take 1 tablet by mouth daily.  . folic acid (FOLVITE) 659 MCG tablet Take 800 mcg by mouth daily.  . furosemide (LASIX) 20 MG tablet Take 20 mg by mouth daily as needed for  fluid or edema.  . isosorbide mononitrate (IMDUR) 60 MG 24 hr tablet Take 60 mg by mouth daily.  Marland Kitchen levothyroxine (SYNTHROID, LEVOTHROID) 75 MCG tablet Take 75 mcg by mouth daily before breakfast.  . Magnesium 250 MG TABS Take 1 tablet by mouth daily.  . Melatonin 10 MG TABS Take 20 mg by mouth at bedtime.  . metoprolol tartrate (LOPRESSOR) 25 MG tablet Take 25 mg by mouth 2 (two) times daily.  . nitroGLYCERIN (NITROSTAT) 0.4 MG SL tablet Place 0.4 mg under the tongue every 5 (five) minutes as needed for chest pain.  . pantoprazole (PROTONIX) 40 MG tablet Take 40 mg by mouth daily.  . pramipexole (MIRAPEX) 1 MG tablet Take 1.5 mg by mouth daily.  . sertraline (ZOLOFT) 100 MG tablet Take 200 mg by mouth at bedtime.  . traZODone (DESYREL) 100 MG tablet Take 200 mg by mouth at bedtime.     Allergies:   Codeine and Iohexol   Social History    Social History  . Marital status: Married    Spouse name: N/A  . Number of children: N/A  . Years of education: N/A   Social History Main Topics  . Smoking status: Former Smoker    Packs/day: 0.75    Years: 40.00    Types: Cigarettes    Start date: 09/28/1965    Quit date: 05/27/2006  . Smokeless tobacco: Never Used  . Alcohol use No  . Drug use: No  . Sexual activity: Not Currently   Other Topics Concern  . None   Social History Narrative  . None     Family History: The patient's family history includes Alcohol abuse in her father; Diabetes in her mother; Early death in her brother; Hyperlipidemia in her mother; Hypertension in her father; Lung cancer in her father; Stomach cancer in her mother. ROS:   Please see the history of present illness.     All other systems reviewed and are negative.  EKGs/Labs/Other Studies Reviewed:    The following studies were reviewed today: Prior office notes, lab work, EKG  EKG: NSR 79. Normal. Personally viewed.   Recent Labs: No results found for requested labs within last 8760 hours.   Recent Lipid Panel    Component Value Date/Time   CHOL 149 12/12/2015 1515   TRIG 54 12/12/2015 1515   HDL 94 12/12/2015 1515   CHOLHDL 1.6 12/12/2015 1515   VLDL 11 12/12/2015 1515   LDLCALC 44 12/12/2015 1515    Physical Exam:    VS:  BP (!) 104/50   Pulse 70   Ht 5\' 2"  (1.575 m)   Wt 197 lb 6.4 oz (89.5 kg)   BMI 36.10 kg/m     Wt Readings from Last 3 Encounters:  02/01/17 197 lb 6.4 oz (89.5 kg)  09/10/16 207 lb (93.9 kg)  08/23/16 203 lb (92.1 kg)     GEN:  Well nourished, well developed in no acute distress HEENT: Normal NECK: No JVD; No carotid bruits LYMPHATICS: No lymphadenopathy CARDIAC: RRR, no murmurs, rubs, gallops, CABG scar, pacer noted.  RESPIRATORY:  Clear to auscultation without rales, wheezing or rhonchi  ABDOMEN: Soft, non-tender, non-distended MUSCULOSKELETAL:  No edema; No deformity SKIN: Warm and dry,  mild edema B NEUROLOGIC:  Alert and oriented x 3 PSYCHIATRIC:  Normal affect   ASSESSMENT:    1. Atherosclerosis of native coronary artery of native heart without angina pectoris   2. Essential hypertension   3. MYOCARDIAL INFARCTION, HX OF  4. History of CVA (cerebrovascular accident)   5. Angina pectoris ([place])   6. Pacemaker -[employer]   7. Bilateral leg edema    PLAN:    In order of problems listed above:  CAD/CABG/history of MI  - ASA, Imdur, metop, statin  - no angina currently  Angina  - rare use of SL NTG. Controlled  [place] HTN  - controlled  - meds reviewed  HL  - at goal. Labs reviewed  - no issues with medication  History of CVA  - ASA, Plavix. Stable  Pacer  - Dr. Lovena Le  - Stable. She still has some mild tenderness at pacemaker site. No erythema.  LE edema  - chronic  - lasix 40 and 20 alternating.   - Quite mild currently. She states that by the end of the day it is troublesome to get shoes on.  Morbid obesity  - BMI greater than 35 with no comorbidities. Continue to work on weight. She states that most of her adult life she was 130 pounds. After her first heart attack she continued to gain weight.  DNR  - She has been this way for years. She states that she will obtain paperwork from Dr. Alroy Dust to update.  Medication Adjustments/Labs and Tests Ordered: Current medicines are reviewed at length with the patient today.  Concerns regarding medicines are outlined above. Labs and tests ordered and medication changes are outlined in the patient instructions below:  Patient Instructions  Medication Instructions:  The current medical regimen is effective;  continue present plan and medications.  Follow-Up: Follow up in 1 year with Dr. Marlou Porch.  You will receive a letter in the mail 2 months before you are due.  Please call us when you receive this letter to schedule your follow up appointment.  If you need a refill on your cardiac  medications before your next appointment, please call your pharmacy.  Thank you for choosing [facility]!!        Signed, Candee Furbish, MD  02/01/2017 9:55 AM    [place]

## 2017-02-01 NOTE — Patient Instructions (Signed)

## 2017-04-14 ENCOUNTER — Emergency Department (HOSPITAL_BASED_OUTPATIENT_CLINIC_OR_DEPARTMENT_OTHER): Payer: Medicare HMO

## 2017-04-14 ENCOUNTER — Encounter (HOSPITAL_BASED_OUTPATIENT_CLINIC_OR_DEPARTMENT_OTHER): Payer: Self-pay | Admitting: *Deleted

## 2017-04-14 ENCOUNTER — Observation Stay (HOSPITAL_BASED_OUTPATIENT_CLINIC_OR_DEPARTMENT_OTHER)
Admission: EM | Admit: 2017-04-14 | Discharge: 2017-04-16 | Disposition: A | Discharge status: Home / Self Care | Payer: Medicare HMO | Attending: Internal Medicine | Admitting: Internal Medicine

## 2017-04-14 DIAGNOSIS — Z8673 Personal history of transient ischemic attack (TIA), and cerebral infarction without residual deficits: Secondary | ICD-10-CM | POA: Insufficient documentation

## 2017-04-14 DIAGNOSIS — M7989 Other specified soft tissue disorders: Secondary | ICD-10-CM | POA: Diagnosis present

## 2017-04-14 DIAGNOSIS — B9620 Unspecified Escherichia coli [E. coli] as the cause of diseases classified elsewhere: Secondary | ICD-10-CM | POA: Diagnosis not present

## 2017-04-14 DIAGNOSIS — R0602 Shortness of breath: Secondary | ICD-10-CM

## 2017-04-14 DIAGNOSIS — I129 Hypertensive chronic kidney disease with stage 1 through stage 4 chronic kidney disease, or unspecified chronic kidney disease: Secondary | ICD-10-CM | POA: Diagnosis not present

## 2017-04-14 DIAGNOSIS — R0902 Hypoxemia: Secondary | ICD-10-CM

## 2017-04-14 DIAGNOSIS — Z7902 Long term (current) use of antithrombotics/antiplatelets: Secondary | ICD-10-CM | POA: Insufficient documentation

## 2017-04-14 DIAGNOSIS — Z951 Presence of aortocoronary bypass graft: Secondary | ICD-10-CM | POA: Insufficient documentation

## 2017-04-14 DIAGNOSIS — I2510 Atherosclerotic heart disease of native coronary artery without angina pectoris: Secondary | ICD-10-CM

## 2017-04-14 DIAGNOSIS — Z950 Presence of cardiac pacemaker: Secondary | ICD-10-CM | POA: Insufficient documentation

## 2017-04-14 DIAGNOSIS — Z7982 Long term (current) use of aspirin: Secondary | ICD-10-CM | POA: Diagnosis not present

## 2017-04-14 DIAGNOSIS — G4700 Insomnia, unspecified: Secondary | ICD-10-CM | POA: Diagnosis present

## 2017-04-14 DIAGNOSIS — Z79899 Other long term (current) drug therapy: Secondary | ICD-10-CM | POA: Insufficient documentation

## 2017-04-14 DIAGNOSIS — I25810 Atherosclerosis of coronary artery bypass graft(s) without angina pectoris: Secondary | ICD-10-CM | POA: Insufficient documentation

## 2017-04-14 DIAGNOSIS — E039 Hypothyroidism, unspecified: Secondary | ICD-10-CM | POA: Diagnosis not present

## 2017-04-14 DIAGNOSIS — N183 Chronic kidney disease, stage 3 unspecified: Secondary | ICD-10-CM

## 2017-04-14 DIAGNOSIS — R0609 Other forms of dyspnea: Secondary | ICD-10-CM | POA: Diagnosis not present

## 2017-04-14 DIAGNOSIS — R079 Chest pain, unspecified: Secondary | ICD-10-CM | POA: Insufficient documentation

## 2017-04-14 DIAGNOSIS — N390 Urinary tract infection, site not specified: Secondary | ICD-10-CM | POA: Diagnosis not present

## 2017-04-14 DIAGNOSIS — G2581 Restless legs syndrome: Secondary | ICD-10-CM | POA: Insufficient documentation

## 2017-04-14 DIAGNOSIS — I495 Sick sinus syndrome: Secondary | ICD-10-CM | POA: Insufficient documentation

## 2017-04-14 DIAGNOSIS — Z45018 Encounter for adjustment and management of other part of cardiac pacemaker: Secondary | ICD-10-CM

## 2017-04-14 DIAGNOSIS — F329 Major depressive disorder, single episode, unspecified: Secondary | ICD-10-CM | POA: Insufficient documentation

## 2017-04-14 DIAGNOSIS — E785 Hyperlipidemia, unspecified: Secondary | ICD-10-CM | POA: Diagnosis present

## 2017-04-14 DIAGNOSIS — K219 Gastro-esophageal reflux disease without esophagitis: Secondary | ICD-10-CM | POA: Diagnosis present

## 2017-04-14 DIAGNOSIS — I252 Old myocardial infarction: Secondary | ICD-10-CM | POA: Diagnosis not present

## 2017-04-14 DIAGNOSIS — F32A Depression, unspecified: Secondary | ICD-10-CM | POA: Diagnosis present

## 2017-04-14 DIAGNOSIS — R600 Localized edema: Secondary | ICD-10-CM | POA: Diagnosis not present

## 2017-04-14 DIAGNOSIS — J449 Chronic obstructive pulmonary disease, unspecified: Secondary | ICD-10-CM | POA: Diagnosis not present

## 2017-04-14 DIAGNOSIS — F419 Anxiety disorder, unspecified: Secondary | ICD-10-CM | POA: Insufficient documentation

## 2017-04-14 DIAGNOSIS — J9611 Chronic respiratory failure with hypoxia: Secondary | ICD-10-CM | POA: Diagnosis present

## 2017-04-14 DIAGNOSIS — R55 Syncope and collapse: Secondary | ICD-10-CM

## 2017-04-14 DIAGNOSIS — E782 Mixed hyperlipidemia: Secondary | ICD-10-CM | POA: Diagnosis not present

## 2017-04-14 DIAGNOSIS — I10 Essential (primary) hypertension: Secondary | ICD-10-CM | POA: Diagnosis present

## 2017-04-14 HISTORY — DX: Personal history of other medical treatment: Z92.89

## 2017-04-14 HISTORY — DX: Peripheral vascular disease, unspecified: I73.9

## 2017-04-14 HISTORY — DX: Disorder of arteries and arterioles, unspecified: I77.9

## 2017-04-14 HISTORY — DX: Bradycardia, unspecified: R00.1

## 2017-04-14 LAB — COMPREHENSIVE METABOLIC PANEL
ALT: 37 U/L (ref 14–54)
ANION GAP: 10 (ref 5–15)
AST: 35 U/L (ref 15–41)
Albumin: 4.1 g/dL (ref 3.5–5.0)
Alkaline Phosphatase: 114 U/L (ref 38–126)
BUN: 17 mg/dL (ref 6–20)
CHLORIDE: 97 mmol/L — AB (ref 101–111)
CO2: 29 mmol/L (ref 22–32)
CREATININE: 1.27 mg/dL — AB (ref 0.44–1.00)
Calcium: 8.9 mg/dL (ref 8.9–10.3)
GFR, EST AFRICAN AMERICAN: 48 mL/min — AB (ref >60)
GFR, EST NON AFRICAN AMERICAN: 41 mL/min — AB (ref >60)
Glucose, Bld: 84 mg/dL (ref 65–99)
POTASSIUM: 4.5 mmol/L (ref 3.5–5.1)
SODIUM: 136 mmol/L (ref 135–145)
Total Bilirubin: 0.6 mg/dL (ref 0.3–1.2)
Total Protein: 7.6 g/dL (ref 6.5–8.1)

## 2017-04-14 LAB — CBC
HEMATOCRIT: 41 % (ref 36.0–46.0)
HEMOGLOBIN: 14 g/dL (ref 12.0–15.0)
MCH: 29.7 pg (ref 26.0–34.0)
MCHC: 34.1 g/dL (ref 30.0–36.0)
MCV: 86.9 fL (ref 78.0–100.0)
Platelets: 151 10*3/uL (ref 150–400)
RBC: 4.72 MIL/uL (ref 3.87–5.11)
RDW: 13.8 % (ref 11.5–15.5)
WBC: 7.2 10*3/uL (ref 4.0–10.5)

## 2017-04-14 LAB — URINALYSIS, ROUTINE W REFLEX MICROSCOPIC
Bilirubin Urine: NEGATIVE
Glucose, UA: NEGATIVE mg/dL
KETONES UR: NEGATIVE mg/dL
NITRITE: NEGATIVE
PH: 7.5 (ref 5.0–8.0)
PROTEIN: NEGATIVE mg/dL
Specific Gravity, Urine: 1.005 (ref 1.005–1.030)

## 2017-04-14 LAB — URINALYSIS, MICROSCOPIC (REFLEX): Squamous Epithelial / LPF: NONE SEEN

## 2017-04-14 LAB — CBC WITH DIFFERENTIAL/PLATELET
Basophils Absolute: 0 10*3/uL (ref 0.0–0.1)
Basophils Relative: 0 %
EOS ABS: 0.2 10*3/uL (ref 0.0–0.7)
Eosinophils Relative: 3 %
HEMATOCRIT: 40.1 % (ref 36.0–46.0)
HEMOGLOBIN: 13.4 g/dL (ref 12.0–15.0)
LYMPHS ABS: 1.5 10*3/uL (ref 0.7–4.0)
LYMPHS PCT: 25 %
MCH: 30.2 pg (ref 26.0–34.0)
MCHC: 33.4 g/dL (ref 30.0–36.0)
MCV: 90.3 fL (ref 78.0–100.0)
Monocytes Absolute: 0.8 10*3/uL (ref 0.1–1.0)
Monocytes Relative: 13 %
NEUTROS ABS: 3.4 10*3/uL (ref 1.7–7.7)
NEUTROS PCT: 59 %
Platelets: 157 10*3/uL (ref 150–400)
RBC: 4.44 MIL/uL (ref 3.87–5.11)
RDW: 14.1 % (ref 11.5–15.5)
WBC: 5.8 10*3/uL (ref 4.0–10.5)

## 2017-04-14 LAB — TROPONIN I: Troponin I: <0.03 ng/mL (ref <0.03)

## 2017-04-14 LAB — CREATININE, SERUM
Creatinine, Ser: 1.38 mg/dL — ABNORMAL HIGH (ref 0.44–1.00)
GFR calc Af Amer: 43 mL/min — ABNORMAL LOW (ref >60)
GFR calc non Af Amer: 37 mL/min — ABNORMAL LOW (ref >60)

## 2017-04-14 LAB — D-DIMER, QUANTITATIVE: D-Dimer, Quant: 0.27 ug/mL-FEU (ref 0.00–0.50)

## 2017-04-14 LAB — BRAIN NATRIURETIC PEPTIDE: B Natriuretic Peptide: 137.2 pg/mL — ABNORMAL HIGH (ref 0.0–100.0)

## 2017-04-14 LAB — D-DIMER, QUANTITATIVE (NOT AT ARMC)

## 2017-04-14 MED ORDER — ISOSORBIDE MONONITRATE ER 60 MG PO TB24
60.0000 mg | ORAL_TABLET | Freq: Every day | ORAL | Status: DC
  Administered 2017-04-14 – 2017-04-16 (×3): 60 mg via ORAL
  Filled 2017-04-14 (×3): qty 1

## 2017-04-14 MED ORDER — MAGNESIUM OXIDE 400 (241.3 MG) MG PO TABS
200.0000 mg | ORAL_TABLET | Freq: Every day | ORAL | Status: DC
  Administered 2017-04-15 – 2017-04-16 (×2): 200 mg via ORAL
  Filled 2017-04-14 (×3): qty 1

## 2017-04-14 MED ORDER — ONDANSETRON HCL 4 MG PO TABS: 4.0000 mg | ORAL_TABLET | Freq: Four times a day (QID) | ORAL | Status: DC | PRN

## 2017-04-14 MED ORDER — CLOPIDOGREL BISULFATE 75 MG PO TABS
75.0000 mg | ORAL_TABLET | Freq: Every day | ORAL | Status: DC
  Administered 2017-04-15 – 2017-04-16 (×2): 75 mg via ORAL
  Filled 2017-04-14 (×2): qty 1

## 2017-04-14 MED ORDER — MAGNESIUM 250 MG PO TABS: 1.0000 | ORAL_TABLET | Freq: Every day | ORAL | Status: DC

## 2017-04-14 MED ORDER — ALBUTEROL SULFATE (2.5 MG/3ML) 0.083% IN NEBU: 2.5000 mg | INHALATION_SOLUTION | Freq: Four times a day (QID) | RESPIRATORY_TRACT | Status: DC | PRN

## 2017-04-14 MED ORDER — NITROGLYCERIN 0.4 MG SL SUBL: 0.4000 mg | SUBLINGUAL_TABLET | SUBLINGUAL | Status: DC | PRN

## 2017-04-14 MED ORDER — FERROUS SULFATE 325 (65 FE) MG PO TABS
325.0000 mg | ORAL_TABLET | Freq: Every day | ORAL | Status: DC
  Administered 2017-04-15 – 2017-04-16 (×2): 325 mg via ORAL
  Filled 2017-04-14 (×2): qty 1

## 2017-04-14 MED ORDER — SERTRALINE HCL 100 MG PO TABS
200.0000 mg | ORAL_TABLET | Freq: Every day | ORAL | Status: DC
  Administered 2017-04-14 – 2017-04-15 (×2): 200 mg via ORAL
  Filled 2017-04-14 (×2): qty 2

## 2017-04-14 MED ORDER — ACETAMINOPHEN 325 MG PO TABS: 650.0000 mg | ORAL_TABLET | Freq: Four times a day (QID) | ORAL | Status: DC | PRN

## 2017-04-14 MED ORDER — ALPRAZOLAM 1 MG PO TABS
1.0000 mg | ORAL_TABLET | Freq: Every day | ORAL | Status: DC
  Administered 2017-04-14 – 2017-04-15 (×2): 1 mg via ORAL
  Filled 2017-04-14 (×2): qty 1

## 2017-04-14 MED ORDER — FOLIC ACID 1 MG PO TABS
1.0000 mg | ORAL_TABLET | Freq: Every day | ORAL | Status: DC
  Administered 2017-04-15 – 2017-04-16 (×2): 1 mg via ORAL
  Filled 2017-04-14 (×2): qty 1

## 2017-04-14 MED ORDER — PANTOPRAZOLE SODIUM 40 MG PO TBEC
40.0000 mg | DELAYED_RELEASE_TABLET | Freq: Every day | ORAL | Status: DC
  Administered 2017-04-15 – 2017-04-16 (×2): 40 mg via ORAL
  Filled 2017-04-14 (×2): qty 1

## 2017-04-14 MED ORDER — FUROSEMIDE 10 MG/ML IJ SOLN
20.0000 mg | Freq: Once | INTRAMUSCULAR | Status: AC
  Administered 2017-04-14: 20 mg via INTRAVENOUS
  Filled 2017-04-14: qty 2

## 2017-04-14 MED ORDER — ATORVASTATIN CALCIUM 20 MG PO TABS
20.0000 mg | ORAL_TABLET | Freq: Every day | ORAL | Status: DC
  Administered 2017-04-15 – 2017-04-16 (×2): 20 mg via ORAL
  Filled 2017-04-14 (×2): qty 1

## 2017-04-14 MED ORDER — IRON 325 (65 FE) MG PO TABS: 1.0000 | ORAL_TABLET | Freq: Every day | ORAL | Status: DC

## 2017-04-14 MED ORDER — MELATONIN 10 MG PO TABS: 20.0000 mg | ORAL_TABLET | Freq: Every day | ORAL | Status: DC

## 2017-04-14 MED ORDER — ENOXAPARIN SODIUM 40 MG/0.4ML ~~LOC~~ SOLN
40.0000 mg | SUBCUTANEOUS | Status: DC
  Administered 2017-04-14: 40 mg via SUBCUTANEOUS
  Filled 2017-04-14 (×2): qty 0.4

## 2017-04-14 MED ORDER — DIPHENHYDRAMINE HCL 25 MG PO CAPS: 50.0000 mg | ORAL_CAPSULE | Freq: Every evening | ORAL | Status: DC | PRN

## 2017-04-14 MED ORDER — ASPIRIN EC 81 MG PO TBEC
81.0000 mg | DELAYED_RELEASE_TABLET | Freq: Every day | ORAL | Status: DC
  Administered 2017-04-15 – 2017-04-16 (×2): 81 mg via ORAL
  Filled 2017-04-14 (×2): qty 1

## 2017-04-14 MED ORDER — ONDANSETRON HCL 4 MG/2ML IJ SOLN: 4.0000 mg | Freq: Four times a day (QID) | INTRAMUSCULAR | Status: DC | PRN

## 2017-04-14 MED ORDER — LEVOTHYROXINE SODIUM 50 MCG PO TABS
75.0000 ug | ORAL_TABLET | Freq: Every day | ORAL | Status: DC
  Administered 2017-04-15 – 2017-04-16 (×2): 75 ug via ORAL
  Filled 2017-04-14 (×2): qty 1

## 2017-04-14 MED ORDER — ACETAMINOPHEN 650 MG RE SUPP: 650.0000 mg | Freq: Four times a day (QID) | RECTAL | Status: DC | PRN

## 2017-04-14 MED ORDER — METOPROLOL TARTRATE 25 MG PO TABS
25.0000 mg | ORAL_TABLET | Freq: Two times a day (BID) | ORAL | Status: DC
  Administered 2017-04-14 – 2017-04-16 (×4): 25 mg via ORAL
  Filled 2017-04-14 (×4): qty 1

## 2017-04-14 MED ORDER — PRAMIPEXOLE DIHYDROCHLORIDE 1 MG PO TABS
2.0000 mg | ORAL_TABLET | Freq: Every day | ORAL | Status: DC
  Administered 2017-04-14 – 2017-04-16 (×3): 2 mg via ORAL
  Filled 2017-04-14 (×3): qty 2

## 2017-04-14 MED ORDER — ALBUTEROL SULFATE HFA 108 (90 BASE) MCG/ACT IN AERS: 1.0000 | INHALATION_SPRAY | Freq: Four times a day (QID) | RESPIRATORY_TRACT | Status: DC | PRN

## 2017-04-14 MED ORDER — TRAZODONE HCL 100 MG PO TABS
200.0000 mg | ORAL_TABLET | Freq: Every day | ORAL | Status: DC
  Administered 2017-04-14 – 2017-04-15 (×2): 200 mg via ORAL
  Filled 2017-04-14 (×2): qty 2

## 2017-04-14 NOTE — ED Notes (Signed)
Pt remains off the unit in radiology

## 2017-04-14 NOTE — ED Notes (Signed)
Pt went to xray and EKG with be done when she returns

## 2017-04-14 NOTE — ED Notes (Signed)
External female catheter applied d/t pt need to void after diuretic.

## 2017-04-14 NOTE — ED Notes (Signed)
Report to [place], Therapist, sports at [facility].

## 2017-04-14 NOTE — ED Notes (Signed)
Ambulated pt in hallway while monitoring O2 sats; 02 sats 84%-88% with noted dyspnea. EDP made aware.

## 2017-04-14 NOTE — ED Provider Notes (Signed)
[facility] DEPT [facility] Provider Note   CSN: [id number] Arrival date & time: 04/14/17  1350     History   Chief Complaint Chief Complaint  Patient presents with  . Leg Swelling    HPI [name] is a 71 y.o. female with past medical history of COPD, MI, and 3 days of progressively worsening shortness of breath and left lower extremity pain and swelling. Patient is a resident at the [facility]. She reports that for the last 3 days she has had worsening shortness of breath. Patient states that she has a baseline history of COPD and sometimes has intermittent difficulty breathing but reports that the last 2 days symptoms have been significantly worse. Patient notes that she gets significantly short of breath whenever she is talking, regardless of that she is exerting herself or resting. Patient noticed mild worsening of symptoms with exertion. Patient also reports that for the last 3 days she has had worsening swelling of the left lower extremity. Patient states that initially leg was very swollen and was slightly erythematous. She reports that she has been staying off it for the last few days which has improved swelling. Patient reports that when she gets very short of breath she sometimes has some diffuse chest pressure but denies any chest pain. Patient reports that last night she was getting to bed, she fell causing her to hit her head on floor. Patient denies any vomiting, fever, numbness/weaknes off her arms or legs, neck pain, or back pain, PND.   Cardiology: Candee Furbish  The history is provided by the patient.    Past Medical History:  Diagnosis Date  . Anxiety   . Arteriosclerosis of coronary artery bypass graft   . Blood transfusion without reported diagnosis    10/29/2006  . Bulging disc   . Cataract 05/2012  . Chronic kidney disease, stage 3   . Chronic kidney disease, stage III (moderate)   . COPD (chronic obstructive pulmonary disease) ([place])   .  Coronary artery disease   . Depression   . Esophageal reflux   . Hypothyroid   . Insomnia   . Macular degeneration   . MI (myocardial infarction) ([place])    x2  . Mixed hyperlipidemia   . Osteoporosis   . Pacemaker    [employer]  . PVT (paroxysmal ventricular tachycardia) ([place])   . Restless leg syndrome   . Stroke ([place])   . Syncope and collapse   . Tachycardia   . Unspecified hearing loss     Patient Active Problem List   Diagnosis Date Noted  . Exertional dyspnea 04/14/2017  . CAD (coronary artery disease) S/P CABG 04/14/2017  . Sinus node dysfunction ([place]) 08/21/2013  . Atrial tachycardia ([place]) 08/21/2013  . Pacemaker -[employer] 08/21/2013  . Coronary atherosclerosis 11/21/2009  . GASTRIC ULCER 01/21/2009  . DIARRHEA, CHRONIC 12/31/2008  . IRON DEFICIENCY 11/28/2008  . LEG EDEMA 11/19/2008  . DYSPNEA ON EXERTION 11/19/2008  . Angina pectoris ([place]) 11/19/2008  . HEADACHE 08/19/2008  . MACULAR DEGENERATION 02/20/2008  . DISORDER, TOBACCO USE 02/09/2008  . CAROTID ARTERY DISEASE 02/13/2007  . TRANSIENT ISCHEMIC ATTACK 02/13/2007  . INSOMNIA 02/13/2007  . Restless legs syndrome (RLS) 01/27/2007  . OSTEOPENIA 01/13/2007  . ALKALINE PHOSPHATASE, ELEVATED 11/11/2006  . HEMORRHAGE, CONJUNCTIVAL 11/04/2006  . MALAISE AND FATIGUE 11/04/2006  . CERVICAL CANCER 10/11/2006  . HYPERLIPIDEMIA 10/11/2006  . ANXIETY 10/11/2006  . DEPRESSION 10/11/2006  . Essential hypertension 10/11/2006  . MYOCARDIAL INFARCTION, HX  OF 10/11/2006  . GERD 10/11/2006  . OSTEOARTHRITIS 10/11/2006  . LOW BACK PAIN 10/11/2006    Past Surgical History:  Procedure Laterality Date  . CARDIAC CATHETERIZATION  2007   x2;[place]  . CARDIAC CATHETERIZATION  2008  . CATARACT EXTRACTION Right   . CEREBRAL ANGIOGRAM    . colonoscopy    . CORONARY ARTERY BYPASS GRAFT     x 4  . EYE SURGERY  05/2013   cataract removed  . GALLBLADDER SURGERY    . INSERT / REPLACE / REMOVE PACEMAKER  2013    [place], [place]; [employer]  . KNEE ARTHROSCOPY    . LUMBAR DISC SURGERY    . PARTIAL HYSTERECTOMY    . SPINE SURGERY  1999/2001/2006   3 disc replaced    OB History    No data available       Home Medications    Prior to Admission medications   Medication Sig Start Date End Date Taking? Authorizing Provider  albuterol (PROVENTIL HFA;VENTOLIN HFA) 108 (90 Base) MCG/ACT inhaler Inhale 1-2 puffs into the lungs every 6 (six) hours as needed for wheezing or shortness of breath.   Yes [provider]  ALPRAZolam Duanne Moron) 1 MG tablet Take 1 tablet by mouth at bedtime. 01/06/17  Yes [provider]  aspirin 81 MG tablet Take 81 mg by mouth daily.   Yes [provider]  atorvastatin (LIPITOR) 20 MG tablet Take 20 mg by mouth daily.   Yes [provider]  clopidogrel (PLAVIX) 75 MG tablet Take 75 mg by mouth daily.   Yes [provider]  diphenhydrAMINE (BENADRYL) 25 MG tablet Take 50 mg by mouth at bedtime as needed for sleep.    Yes [provider]  Ferrous Sulfate (IRON) 325 (65 Fe) MG TABS Take 1 tablet by mouth daily.   Yes [provider]  folic acid (FOLVITE) 035 MCG tablet Take 800 mcg by mouth daily.   Yes [provider]  furosemide (LASIX) 20 MG tablet Take 20 mg by mouth daily as needed for fluid or edema.   Yes [provider]  isosorbide mononitrate (IMDUR) 60 MG 24 hr tablet Take 60 mg by mouth daily.   Yes [provider]  levothyroxine (SYNTHROID, LEVOTHROID) 75 MCG tablet Take 75 mcg by mouth daily before breakfast.   Yes [provider]  Magnesium 250 MG TABS Take 1 tablet by mouth daily.   Yes [provider]  Melatonin 10 MG TABS Take 20 mg by mouth at bedtime.   Yes [provider]  metoprolol tartrate (LOPRESSOR) 25 MG tablet Take 25 mg by mouth 2 (two) times daily.   Yes [provider]  pantoprazole (PROTONIX) 40 MG tablet Take 40 mg  by mouth daily.   Yes [provider]  pramipexole (MIRAPEX) 1 MG tablet Take 2 mg by mouth daily.    Yes [provider]  sertraline (ZOLOFT) 100 MG tablet Take 200 mg by mouth at bedtime.   Yes [provider]  traZODone (DESYREL) 100 MG tablet Take 200 mg by mouth at bedtime.   Yes [provider]  nitroGLYCERIN (NITROSTAT) 0.4 MG SL tablet Place 0.4 mg under the tongue every 5 (five) minutes as needed for chest pain.    [provider]    Family History Family History  Problem Relation Age of Onset  . Stomach cancer Mother   . Diabetes Mother   . Hyperlipidemia Mother   . Hypertension  Father   . Lung cancer Father   . Alcohol abuse Father   . Early death Brother     Social History Social History  Substance Use Topics  . Smoking status: Former Smoker    Packs/day: 0.75    Years: 40.00    Types: Cigarettes    Start date: 09/28/1965    Quit date: 05/27/2006  . Smokeless tobacco: Never Used  . Alcohol use No     Allergies   Codeine and Iohexol   Review of Systems Review of Systems  Constitutional: Negative for chills and fever.  Eyes: Negative for visual disturbance.  Respiratory: Positive for shortness of breath. Negative for cough.   Cardiovascular: Positive for leg swelling. Negative for chest pain.  Gastrointestinal: Negative for abdominal pain, diarrhea, nausea and vomiting.  Genitourinary: Negative for dysuria and hematuria.  Musculoskeletal: Negative for back pain and neck pain.  Neurological: Negative for dizziness, weakness and numbness.  All other systems reviewed and are negative.    Physical Exam Updated Vital Signs BP 136/67 (BP Location: Right Arm)   Pulse 70   Temp 98.4 F (36.9 C) (Oral)   Resp 16   Ht 5\' 2"  (1.575 m)   Wt 96.3 kg (212 lb 4.9 oz)   SpO2 100%   BMI 38.83 kg/m   Physical Exam  Constitutional: She is oriented to person, place, and time. She appears well-developed and  well-nourished.  Appears uncomfortable  HENT:  Head: Normocephalic and atraumatic.  Mouth/Throat: Oropharynx is clear and moist and mucous membranes are normal.  Eyes: Pupils are equal, round, and reactive to light. Conjunctivae, EOM and lids are normal.  Neck: Full passive range of motion without pain.  Cardiovascular: Normal rate, regular rhythm, normal heart sounds and normal pulses.  Exam reveals no gallop and no friction rub.   No murmur heard. Pulmonary/Chest: Breath sounds normal. Tachypnea noted. She has no decreased breath sounds. She has no wheezes.  No evidence of respiratory distress. Only able to speak in short phrases before becoming SOB.   Abdominal: Soft. Normal appearance. There is no tenderness. There is no rigidity and no guarding.  Musculoskeletal: Normal range of motion.  LLE with very mild edema to the ankle that extends to the distal lower leg with overlying erythema. No pitting edema noted to the remainder of the LLE or the RLE.   Neurological: She is alert and oriented to person, place, and time.  Skin: Skin is warm and dry. Capillary refill takes less than 2 seconds.  Psychiatric: She has a normal mood and affect. Her speech is normal.  Nursing note and vitals reviewed.    ED Treatments / Results  Labs (all labs ordered are listed, but only abnormal results are displayed) Labs Reviewed  COMPREHENSIVE METABOLIC PANEL - Abnormal; Notable for the following:       Result Value   Chloride 97 (*)    Creatinine, Ser 1.27 (*)    GFR calc non Af Amer 41 (*)    GFR calc Af Amer 48 (*)    All other components within normal limits  BRAIN NATRIURETIC PEPTIDE - Abnormal; Notable for the following:    B Natriuretic Peptide 137.2 (*)    All other components within normal limits  URINALYSIS, ROUTINE W REFLEX MICROSCOPIC - Abnormal; Notable for the following:    Hgb urine dipstick SMALL (*)    Leukocytes, UA LARGE (*)    All other components within normal limits    URINALYSIS, MICROSCOPIC (REFLEX) -  Abnormal; Notable for the following:    Bacteria, UA FEW (*)    All other components within normal limits  TROPONIN I  CBC WITH DIFFERENTIAL/PLATELET  D-DIMER, QUANTITATIVE (NOT AT [facility])  TROPONIN I  TROPONIN I  TROPONIN I  BASIC METABOLIC PANEL  CBC  CBC  CREATININE, SERUM  D-DIMER, QUANTITATIVE (NOT AT [facility])    EKG  EKG Interpretation  Date/Time:  Thursday April 14 2017 15:56:24 EDT Ventricular Rate:  70 PR Interval:    QRS Duration: 111 QT Interval:  475 QTC Calculation: 517 R Axis:   136 Text Interpretation:  Atrial-ventricular dual-paced complexes No further rhythm analysis attempted due to paced rhythm Prolonged QT interval When compared with ECG of 01/24/2014, QT has lengthened Confirmed by Delora Fuel ([id number]) on 04/14/2017 4:04:47 PM       Radiology Dg Chest 2 View  Result Date: 04/14/2017 CLINICAL DATA:  Shob, leg swelling, fall and hit head last night. EXAM: CHEST  2 VIEW COMPARISON:  Chest x-ray dated 11/21/2008. FINDINGS: Study is slightly hypoinspiratory with crowding of the perihilar and bibasilar bronchovascular markings. Given the low lung volumes, lungs appear clear. No pleural effusion or pneumothorax seen. Left chest wall dual lead pacemaker in place without evidence of wire discontinuity. Median sternotomy wires appear intact and stable alignment. No acute or suspicious osseous finding. IMPRESSION: No active cardiopulmonary disease. No evidence of pneumonia or pulmonary edema. Electronically Signed   By: Franki Cabot M.D.   On: 04/14/2017 15:18   Ct Head Wo Contrast  Result Date: 04/14/2017 CLINICAL DATA:  Pt keeps falling. Having problems w/balance. fell last night and "bumped" her head. [place] one mo ago. Hx c/s fusion. EXAM: CT HEAD WITHOUT CONTRAST CT CERVICAL SPINE WITHOUT CONTRAST TECHNIQUE: Multidetector CT imaging of the head and cervical spine was performed following the standard protocol without intravenous  contrast. Multiplanar CT image reconstructions of the cervical spine were also generated. COMPARISON:  Head CT, 08/26/2008 FINDINGS: CT HEAD FINDINGS Brain: No evidence of acute infarction, hemorrhage, hydrocephalus, extra-axial collection or mass lesion/mass effect. The ventricles and sulci are mildly enlarged reflecting mild generalized atrophy. Vascular: There is a stable left middle cerebral artery stent. Skull: Normal. Negative for fracture or focal lesion. Sinuses/Orbits: Dependent mucosal thickening or secretions in the right maxillary sinus. Visualized sinuses otherwise clear. Mastoid air cells are clear. Other: None. CT CERVICAL SPINE FINDINGS Alignment: No spondylolisthesis. Slight reversal the normal cervical lordosis, apex at C5. Skull base and vertebrae: No acute fracture. No osteoblastic or osteolytic lesions. Soft tissues and spinal canal: No prevertebral fluid or swelling. No visible canal hematoma. Disc levels: Previous spinal fusion. Bony fusion is seen at C4-C5 and C6. There is also bony fusion across the C6-C7 disc interspace. An anterior fusion plate with screws extends across the C6-C7. The orthopedic hardware is well-seated with no evidence of loosening. Bone graft material is mature. Mild loss of disc height with endplate spurring at J8-A4. Mild disc bulging. No convincing disc herniation. No significant stenosis. Mild to moderate loss of disc height at C7-T1. No disc herniation or significant stenosis. Upper chest: Unremarkable. Other: None. IMPRESSION: HEAD CT: No acute intracranial abnormalities. Stable left middle cerebral artery stent. No skull fracture. CERVICAL CT: No fracture or acute finding. Mature changes from previous fusion from C4 through C7. Electronically Signed   By: Lajean Manes M.D.   On: 04/14/2017 15:43   Ct Cervical Spine Wo Contrast  Result Date: 04/14/2017 CLINICAL DATA:  Pt keeps falling. Having problems w/balance. fell  last night and "bumped" her head. [place] one  mo ago. Hx c/s fusion. EXAM: CT HEAD WITHOUT CONTRAST CT CERVICAL SPINE WITHOUT CONTRAST TECHNIQUE: Multidetector CT imaging of the head and cervical spine was performed following the standard protocol without intravenous contrast. Multiplanar CT image reconstructions of the cervical spine were also generated. COMPARISON:  Head CT, 08/26/2008 FINDINGS: CT HEAD FINDINGS Brain: No evidence of acute infarction, hemorrhage, hydrocephalus, extra-axial collection or mass lesion/mass effect. The ventricles and sulci are mildly enlarged reflecting mild generalized atrophy. Vascular: There is a stable left middle cerebral artery stent. Skull: Normal. Negative for fracture or focal lesion. Sinuses/Orbits: Dependent mucosal thickening or secretions in the right maxillary sinus. Visualized sinuses otherwise clear. Mastoid air cells are clear. Other: None. CT CERVICAL SPINE FINDINGS Alignment: No spondylolisthesis. Slight reversal the normal cervical lordosis, apex at C5. Skull base and vertebrae: No acute fracture. No osteoblastic or osteolytic lesions. Soft tissues and spinal canal: No prevertebral fluid or swelling. No visible canal hematoma. Disc levels: Previous spinal fusion. Bony fusion is seen at C4-C5 and C6. There is also bony fusion across the C6-C7 disc interspace. An anterior fusion plate with screws extends across the C6-C7. The orthopedic hardware is well-seated with no evidence of loosening. Bone graft material is mature. Mild loss of disc height with endplate spurring at T1-X7. Mild disc bulging. No convincing disc herniation. No significant stenosis. Mild to moderate loss of disc height at C7-T1. No disc herniation or significant stenosis. Upper chest: Unremarkable. Other: None. IMPRESSION: HEAD CT: No acute intracranial abnormalities. Stable left middle cerebral artery stent. No skull fracture. CERVICAL CT: No fracture or acute finding. Mature changes from previous fusion from C4 through C7. Electronically  Signed   By: Lajean Manes M.D.   On: 04/14/2017 15:43   US Venous Img Lower Unilateral Left  Result Date: 04/14/2017 CLINICAL DATA:  Chronic left lower extremity pain for several months. Prior MI and stroke. EXAM: LEFT LOWER EXTREMITY VENOUS DOPPLER ULTRASOUND TECHNIQUE: Gray-scale sonography with graded compression, as well as color Doppler and duplex ultrasound were performed to evaluate the lower extremity deep venous systems from the level of the common femoral vein and including the common femoral, femoral, profunda femoral, popliteal and calf veins including the posterior tibial, peroneal and gastrocnemius veins when visible. The superficial great saphenous vein was also interrogated. Spectral Doppler was utilized to evaluate flow at rest and with distal augmentation maneuvers in the common femoral, femoral and popliteal veins. COMPARISON:  05/15/2008. FINDINGS: Contralateral Right Common Femoral Vein: Respiratory phasicity is normal and symmetric with the symptomatic side. No evidence of thrombus. Normal compressibility. Left Common Femoral Vein: No evidence of thrombus. Normal compressibility, respiratory phasicity and response to augmentation. Saphenofemoral Junction: No evidence of thrombus. Normal compressibility and flow on color Doppler imaging. Profunda Femoral Vein: No evidence of thrombus. Normal compressibility and flow on color Doppler imaging. Femoral Vein: No evidence of thrombus. Normal compressibility, respiratory phasicity and response to augmentation. Popliteal Vein: No evidence of thrombus. Normal compressibility, respiratory phasicity and response to augmentation. Calf Veins: No evidence of thrombus. Normal compressibility and flow on color Doppler imaging. Superficial Great Saphenous Vein: No evidence of thrombus. Normal compressibility and flow on color Doppler imaging. Venous Reflux:  Not evaluated. Other Findings:  None. IMPRESSION: No evidence of DVT involving in the left lower  extremity. Electronically Signed   By: Evangeline Dakin M.D.   On: 04/14/2017 15:57    Procedures Procedures (including critical care time)  Medications Ordered in ED  Medications  ALPRAZolam (XANAX) tablet 1 mg (not administered)  atorvastatin (LIPITOR) tablet 20 mg (not administered)  isosorbide mononitrate (IMDUR) 24 hr tablet 60 mg (not administered)  levothyroxine (SYNTHROID, LEVOTHROID) tablet 75 mcg (not administered)  metoprolol tartrate (LOPRESSOR) tablet 25 mg (not administered)  pantoprazole (PROTONIX) EC tablet 40 mg (not administered)  pramipexole (MIRAPEX) tablet 2 mg (not administered)  sertraline (ZOLOFT) tablet 200 mg (not administered)  traZODone (DESYREL) tablet 200 mg (not administered)  clopidogrel (PLAVIX) tablet 75 mg (not administered)  diphenhydrAMINE (BENADRYL) capsule 50 mg (not administered)  folic acid (FOLVITE) tablet 1 mg (not administered)  aspirin EC tablet 81 mg (not administered)  nitroGLYCERIN (NITROSTAT) SL tablet 0.4 mg (not administered)  acetaminophen (TYLENOL) tablet 650 mg (not administered)    Or  acetaminophen (TYLENOL) suppository 650 mg (not administered)  ondansetron (ZOFRAN) tablet 4 mg (not administered)    Or  ondansetron (ZOFRAN) injection 4 mg (not administered)  enoxaparin (LOVENOX) injection 40 mg (not administered)  albuterol (PROVENTIL) (2.5 MG/3ML) 0.083% nebulizer solution 2.5 mg (not administered)  ferrous sulfate tablet 325 mg (not administered)  magnesium oxide (MAG-OX) tablet 200 mg (not administered)  furosemide (LASIX) injection 20 mg (20 mg Intravenous Given 04/14/17 1632)     Initial Impression / Assessment and Plan / ED Course  I have reviewed the triage vital signs and the nursing notes.  Pertinent labs & imaging results that were available during my care of the patient were reviewed by me and considered in my medical decision making (see chart for details).     71 yo F with PMH/o MI, CABG how presents with 3  days of worsening SOB and LLE swelling. Symptoms are worsened with exertion and talking. Patient reports that the swelling in her left lower extremity has improved since onset. Patient was sent by her primary care doctor at her nursing facility for evaluation. [place]  Consider acute infectious etiology vs cardiac etiology vs PE vs new onset heart failure. Plan to check basic labs including, CBC, CMP, Troponin, EKG, CXR, BNP. Given LLE symtoms, will evaluate for DVT with LLE ultrasound. Patient has an allergy to contrast so cannot obtain a CTA of chest. Will obtain D Dimer for evaluation. Given history of fall, will obtain CT head and CT C-spine for evaluation of any acute abnormality. History/physical exam are not concerning for a COPD exacerbation given lack of wheezing on exam.   Review of patient's records show that she was seen by Carolynn Comment (Cardiology) in May 2018. At that time, it appears she was having some lower extremity edema and was started on 20 mg Lasix qday.   Labs and imaging reviewed. Chest x-ray is negative for any acute infectious etiology. When compared to CXR in 2010 there is noticeable enlargement of the heart and some mild cephalization. EKG shows paced rhythm with QT prolongation. CMP is unremarkable. CBC unremarkable. Troponin is negative. D-dimer is negative. BNP is slightly elevated. CT of head is negative for any acute abnormality. CT C-spine is negative for any acute fracture or dislocation. Ultrasound of lower extremity is negative for any DVT. Discussed results with patient. She still feels like she is short of breath. She is only able to speak short phrases before getting winded. Unsure of the etiology. O2 sats are 97% on room air. Will plan to ambulate patient in the department for evaluation of her O2 sats.  Pertinent or to ambulate patient. Patient started to desaturate into the 86% on room air with induration around  the department. After the endocrine. Patient was  tachypneic and O2 sats are remaining from 80-84% on room air. Given these findings with ambulation, consider admission for further work up.   Discussed with hospitalist. Will plan to admit.   Final Clinical Impressions(s) / ED Diagnoses   Final diagnoses:  Hypoxia  Shortness of breath    New Prescriptions Current Discharge Medication List       Desma Mcgregor 04/14/17 2122    Macarthur Critchley, MD 04/23/17 [phone]

## 2017-04-14 NOTE — Progress Notes (Signed)
Patient presents with dyspnea, worse on exertion, some chest tightness. Found to have hypoxemia on exertion. Admission for Rule out ACS, needs evaluation for Heart failure, ECHO and hypoxemia.  Accepted to telemetry

## 2017-04-14 NOTE — H&P (Signed)
History and Physical    [name] GBT:[id number] DOB: 17-Aug-1946 DOA: 04/14/2017  PCP: Marry Guan  Patient coming from: Home.  Chief Complaint: Shortness of breath.  HPI: [name] is a 71 y.o. female with history of CAD status post CABG, symptomatic bradycardia status post pacemaker placement, hypertension, COPD, stroke and hypothyroidism presented to the ER at [facility] [place] with complaints of shortness of breath. Patient states that over the last 1 month patient has been having increasing exertional shortness of breath limiting her movements. Patient usually walks around with walker. She had a fall last month. Patient denies any chest pain and productive cough fever or chills.   ED Course: In the ER chest x-ray was unremarkable. BNP is minimally elevated. Since patient also complained of left lower extremity swelling patient had Dopplers which was negative for DVT. Given the symptoms patient was given Lasix 20 mg IV in the ER and admitted for further management of exertional dyspnea likely from CHF versus to rule out ACS. Since patient had fall CT of the head and C-spine were done which were unremarkable.  Review of Systems: As per HPI, rest all negative.   Past Medical History:  Diagnosis Date  . Anxiety   . Arteriosclerosis of coronary artery bypass graft   . Blood transfusion without reported diagnosis    10/29/2006  . Bulging disc   . Cataract 05/2012  . Chronic kidney disease, stage 3   . Chronic kidney disease, stage III (moderate)   . COPD (chronic obstructive pulmonary disease) ([place])   . Coronary artery disease   . Depression   . Esophageal reflux   . Hypothyroid   . Insomnia   . Macular degeneration   . MI (myocardial infarction) ([place])    x2  . Mixed hyperlipidemia   . Osteoporosis   . Pacemaker    [employer]  . PVT (paroxysmal ventricular tachycardia) ([place])   . Restless leg syndrome   . Stroke ([place])   . Syncope and collapse   .  Tachycardia   . Unspecified hearing loss     Past Surgical History:  Procedure Laterality Date  . CARDIAC CATHETERIZATION  2007   x2;[place]  . CARDIAC CATHETERIZATION  2008  . CATARACT EXTRACTION Right   . CEREBRAL ANGIOGRAM    . colonoscopy    . CORONARY ARTERY BYPASS GRAFT     x 4  . EYE SURGERY  05/2013   cataract removed  . GALLBLADDER SURGERY    . INSERT / REPLACE / REMOVE PACEMAKER  2013   [place], [place]; [employer]  . KNEE ARTHROSCOPY    . LUMBAR DISC SURGERY    . PARTIAL HYSTERECTOMY    . SPINE SURGERY  1999/2001/2006   3 disc replaced     reports that she quit smoking about 10 years ago. Her smoking use included Cigarettes. She started smoking about 51 years ago. She has a 30.00 pack-year smoking history. She has never used smokeless tobacco. She reports that she does not drink alcohol or use drugs.  Allergies  Allergen Reactions  . Codeine     swelling  . Iohexol      Desc: PT HAD CONTRAST REACTION YRS AGO NEEDS 13 HR PREP FOR ALL CONTRAST STUDIES     Family History  Problem Relation Age of Onset  . Stomach cancer Mother   . Diabetes Mother   . Hyperlipidemia Mother   . Hypertension Father   . Lung cancer Father   .  Alcohol abuse Father   . Early death Brother     Prior to Admission medications   Medication Sig Start Date End Date Taking? Authorizing Provider  albuterol (PROVENTIL HFA;VENTOLIN HFA) 108 (90 Base) MCG/ACT inhaler Inhale 1-2 puffs into the lungs every 6 (six) hours as needed for wheezing or shortness of breath.   Yes [provider]  ALPRAZolam Duanne Moron) 1 MG tablet Take 1 tablet by mouth at bedtime. 01/06/17  Yes [provider]  aspirin 81 MG tablet Take 81 mg by mouth daily.   Yes [provider]  atorvastatin (LIPITOR) 20 MG tablet Take 20 mg by mouth daily.   Yes [provider]  clopidogrel (PLAVIX) 75 MG tablet Take 75 mg by mouth daily.   Yes [provider]  diphenhydrAMINE  (BENADRYL) 25 MG tablet Take 50 mg by mouth at bedtime as needed for sleep.    Yes [provider]  Ferrous Sulfate (IRON) 325 (65 Fe) MG TABS Take 1 tablet by mouth daily.   Yes [provider]  folic acid (FOLVITE) 614 MCG tablet Take 800 mcg by mouth daily.   Yes [provider]  furosemide (LASIX) 20 MG tablet Take 20 mg by mouth daily as needed for fluid or edema.   Yes [provider]  isosorbide mononitrate (IMDUR) 60 MG 24 hr tablet Take 60 mg by mouth daily.   Yes [provider]  levothyroxine (SYNTHROID, LEVOTHROID) 75 MCG tablet Take 75 mcg by mouth daily before breakfast.   Yes [provider]  Magnesium 250 MG TABS Take 1 tablet by mouth daily.   Yes [provider]  Melatonin 10 MG TABS Take 20 mg by mouth at bedtime.   Yes [provider]  metoprolol tartrate (LOPRESSOR) 25 MG tablet Take 25 mg by mouth 2 (two) times daily.   Yes [provider]  pantoprazole (PROTONIX) 40 MG tablet Take 40 mg by mouth daily.   Yes [provider]  pramipexole (MIRAPEX) 1 MG tablet Take 2 mg by mouth daily.    Yes [provider]  sertraline (ZOLOFT) 100 MG tablet Take 200 mg by mouth at bedtime.   Yes [provider]  traZODone (DESYREL) 100 MG tablet Take 200 mg by mouth at bedtime.   Yes [provider]  nitroGLYCERIN (NITROSTAT) 0.4 MG SL tablet Place 0.4 mg under the tongue every 5 (five) minutes as needed for chest pain.    [provider]    Physical Exam: Vitals:   04/14/17 1354 04/14/17 1623 04/14/17 1630 04/14/17 1940  BP: 119/61 133/79 125/75 136/67  Pulse: 71 77 69 70  Resp: (!) 22 15 18 16   Temp: 97.9 F (36.6 C)   98.4 F (36.9 C)  TempSrc: Oral   Oral  SpO2: 96% 100% 100% 100%  Weight: 89.4 kg (197 lb)   96.3 kg (212 lb 4.9 oz)  Height: 5\' 2"  (1.575 m)   5\' 2"  (1.575 m)      Constitutional: Moderately built and nourished. Vitals:   04/14/17  1354 04/14/17 1623 04/14/17 1630 04/14/17 1940  BP: 119/61 133/79 125/75 136/67  Pulse: 71 77 69 70  Resp: (!) 22 15 18 16   Temp: 97.9 F (36.6 C)   98.4 F (36.9 C)  TempSrc: Oral   Oral  SpO2: 96% 100% 100% 100%  Weight: 89.4 kg (197 lb)   96.3 kg (212 lb 4.9 oz)  Height: 5\' 2"  (1.575 m)   5\' 2"  (1.575  m)   Eyes: Anicteric no pallor. ENMT: No discharge from the ears eyes nose and mouth. Neck: No mass felt. No JVD appreciated. Respiratory: No rhonchi or crepitations. Cardiovascular: S1 and S2 heard no murmurs appreciated. Abdomen: Soft nontender bowel sounds present. No guarding or rigidity. Musculoskeletal: Mild edema in the left lower extremity. Skin: No rash. Neurologic: Alert awake oriented to time place and person. Moves all extremities. Psychiatric: Appears normal. Normal affect.   Labs on Admission: I have personally reviewed following labs and imaging studies  CBC:  Recent Labs Lab 04/14/17 1421  WBC 5.8  NEUTROABS 3.4  HGB 13.4  HCT 40.1  MCV 90.3  PLT 841   Basic Metabolic Panel:  Recent Labs Lab 04/14/17 1421  NA 136  K 4.5  CL 97*  CO2 29  GLUCOSE 84  BUN 17  CREATININE 1.27*  CALCIUM 8.9   GFR: Estimated Creatinine Clearance: 44 mL/min (A) (by C-G formula based on SCr of 1.27 mg/dL (H)). Liver Function Tests:  Recent Labs Lab 04/14/17 1421  AST 35  ALT 37  ALKPHOS 114  BILITOT 0.6  PROT 7.6  ALBUMIN 4.1   No results for input(s): LIPASE, AMYLASE in the last 168 hours. No results for input(s): AMMONIA in the last 168 hours. Coagulation Profile: No results for input(s): INR, PROTIME in the last 168 hours. Cardiac Enzymes:  Recent Labs Lab 04/14/17 1421  TROPONINI <0.03   BNP (last 3 results) No results for input(s): PROBNP in the last 8760 hours. HbA1C: No results for input(s): HGBA1C in the last 72 hours. CBG: No results for input(s): GLUCAP in the last 168 hours. Lipid Profile: No results for input(s): CHOL, HDL,  LDLCALC, TRIG, CHOLHDL, LDLDIRECT in the last 72 hours. Thyroid Function Tests: No results for input(s): TSH, T4TOTAL, FREET4, T3FREE, THYROIDAB in the last 72 hours. Anemia Panel: No results for input(s): VITAMINB12, FOLATE, FERRITIN, TIBC, IRON, RETICCTPCT in the last 72 hours. Urine analysis:    Component Value Date/Time   COLORURINE YELLOW 04/14/2017 1559   APPEARANCEUR CLEAR 04/14/2017 1559   APPEARANCEUR Clear 01/24/2014 1550   LABSPEC 1.005 04/14/2017 1559   LABSPEC 1.016 01/24/2014 1550   PHURINE 7.5 04/14/2017 1559   GLUCOSEU NEGATIVE 04/14/2017 1559   GLUCOSEU Negative 01/24/2014 1550   HGBUR SMALL (A) 04/14/2017 1559   BILIRUBINUR NEGATIVE 04/14/2017 1559   BILIRUBINUR Negative 01/24/2014 1550   KETONESUR NEGATIVE 04/14/2017 1559   PROTEINUR NEGATIVE 04/14/2017 1559   NITRITE NEGATIVE 04/14/2017 1559   LEUKOCYTESUR LARGE (A) 04/14/2017 1559   LEUKOCYTESUR 2+ 01/24/2014 1550   Sepsis Labs: @LABRCNTIP (procalcitonin:4,lacticidven:4) )No results found for this or any previous visit (from the past 240 hour(s)).   Radiological Exams on Admission: Dg Chest 2 View  Result Date: 04/14/2017 CLINICAL DATA:  Shob, leg swelling, fall and hit head last night. EXAM: CHEST  2 VIEW COMPARISON:  Chest x-ray dated 11/21/2008. FINDINGS: Study is slightly hypoinspiratory with crowding of the perihilar and bibasilar bronchovascular markings. Given the low lung volumes, lungs appear clear. No pleural effusion or pneumothorax seen. Left chest wall dual lead pacemaker in place without evidence of wire discontinuity. Median sternotomy wires appear intact and stable alignment. No acute or suspicious osseous finding. IMPRESSION: No active cardiopulmonary disease. No evidence of pneumonia or pulmonary edema. Electronically Signed   By: Franki Cabot M.D.   On: 04/14/2017 15:18   Ct Head Wo Contrast  Result Date: 04/14/2017 CLINICAL DATA:  Pt keeps falling. Having problems w/balance. fell last night  and "  bumped" her head. [place] one mo ago. Hx c/s fusion. EXAM: CT HEAD WITHOUT CONTRAST CT CERVICAL SPINE WITHOUT CONTRAST TECHNIQUE: Multidetector CT imaging of the head and cervical spine was performed following the standard protocol without intravenous contrast. Multiplanar CT image reconstructions of the cervical spine were also generated. COMPARISON:  Head CT, 08/26/2008 FINDINGS: CT HEAD FINDINGS Brain: No evidence of acute infarction, hemorrhage, hydrocephalus, extra-axial collection or mass lesion/mass effect. The ventricles and sulci are mildly enlarged reflecting mild generalized atrophy. Vascular: There is a stable left middle cerebral artery stent. Skull: Normal. Negative for fracture or focal lesion. Sinuses/Orbits: Dependent mucosal thickening or secretions in the right maxillary sinus. Visualized sinuses otherwise clear. Mastoid air cells are clear. Other: None. CT CERVICAL SPINE FINDINGS Alignment: No spondylolisthesis. Slight reversal the normal cervical lordosis, apex at C5. Skull base and vertebrae: No acute fracture. No osteoblastic or osteolytic lesions. Soft tissues and spinal canal: No prevertebral fluid or swelling. No visible canal hematoma. Disc levels: Previous spinal fusion. Bony fusion is seen at C4-C5 and C6. There is also bony fusion across the C6-C7 disc interspace. An anterior fusion plate with screws extends across the C6-C7. The orthopedic hardware is well-seated with no evidence of loosening. Bone graft material is mature. Mild loss of disc height with endplate spurring at Z6-X0. Mild disc bulging. No convincing disc herniation. No significant stenosis. Mild to moderate loss of disc height at C7-T1. No disc herniation or significant stenosis. Upper chest: Unremarkable. Other: None. IMPRESSION: HEAD CT: No acute intracranial abnormalities. Stable left middle cerebral artery stent. No skull fracture. CERVICAL CT: No fracture or acute finding. Mature changes from previous fusion from  C4 through C7. Electronically Signed   By: Lajean Manes M.D.   On: 04/14/2017 15:43   Ct Cervical Spine Wo Contrast  Result Date: 04/14/2017 CLINICAL DATA:  Pt keeps falling. Having problems w/balance. fell last night and "bumped" her head. [place] one mo ago. Hx c/s fusion. EXAM: CT HEAD WITHOUT CONTRAST CT CERVICAL SPINE WITHOUT CONTRAST TECHNIQUE: Multidetector CT imaging of the head and cervical spine was performed following the standard protocol without intravenous contrast. Multiplanar CT image reconstructions of the cervical spine were also generated. COMPARISON:  Head CT, 08/26/2008 FINDINGS: CT HEAD FINDINGS Brain: No evidence of acute infarction, hemorrhage, hydrocephalus, extra-axial collection or mass lesion/mass effect. The ventricles and sulci are mildly enlarged reflecting mild generalized atrophy. Vascular: There is a stable left middle cerebral artery stent. Skull: Normal. Negative for fracture or focal lesion. Sinuses/Orbits: Dependent mucosal thickening or secretions in the right maxillary sinus. Visualized sinuses otherwise clear. Mastoid air cells are clear. Other: None. CT CERVICAL SPINE FINDINGS Alignment: No spondylolisthesis. Slight reversal the normal cervical lordosis, apex at C5. Skull base and vertebrae: No acute fracture. No osteoblastic or osteolytic lesions. Soft tissues and spinal canal: No prevertebral fluid or swelling. No visible canal hematoma. Disc levels: Previous spinal fusion. Bony fusion is seen at C4-C5 and C6. There is also bony fusion across the C6-C7 disc interspace. An anterior fusion plate with screws extends across the C6-C7. The orthopedic hardware is well-seated with no evidence of loosening. Bone graft material is mature. Mild loss of disc height with endplate spurring at R6-E4. Mild disc bulging. No convincing disc herniation. No significant stenosis. Mild to moderate loss of disc height at C7-T1. No disc herniation or significant stenosis. Upper chest:  Unremarkable. Other: None. IMPRESSION: HEAD CT: No acute intracranial abnormalities. Stable left middle cerebral artery stent. No skull fracture. CERVICAL CT: No fracture  or acute finding. Mature changes from previous fusion from C4 through C7. Electronically Signed   By: Lajean Manes M.D.   On: 04/14/2017 15:43   US Venous Img Lower Unilateral Left  Result Date: 04/14/2017 CLINICAL DATA:  Chronic left lower extremity pain for several months. Prior MI and stroke. EXAM: LEFT LOWER EXTREMITY VENOUS DOPPLER ULTRASOUND TECHNIQUE: Gray-scale sonography with graded compression, as well as color Doppler and duplex ultrasound were performed to evaluate the lower extremity deep venous systems from the level of the common femoral vein and including the common femoral, femoral, profunda femoral, popliteal and calf veins including the posterior tibial, peroneal and gastrocnemius veins when visible. The superficial great saphenous vein was also interrogated. Spectral Doppler was utilized to evaluate flow at rest and with distal augmentation maneuvers in the common femoral, femoral and popliteal veins. COMPARISON:  05/15/2008. FINDINGS: Contralateral Right Common Femoral Vein: Respiratory phasicity is normal and symmetric with the symptomatic side. No evidence of thrombus. Normal compressibility. Left Common Femoral Vein: No evidence of thrombus. Normal compressibility, respiratory phasicity and response to augmentation. Saphenofemoral Junction: No evidence of thrombus. Normal compressibility and flow on color Doppler imaging. Profunda Femoral Vein: No evidence of thrombus. Normal compressibility and flow on color Doppler imaging. Femoral Vein: No evidence of thrombus. Normal compressibility, respiratory phasicity and response to augmentation. Popliteal Vein: No evidence of thrombus. Normal compressibility, respiratory phasicity and response to augmentation. Calf Veins: No evidence of thrombus. Normal compressibility and  flow on color Doppler imaging. Superficial Great Saphenous Vein: No evidence of thrombus. Normal compressibility and flow on color Doppler imaging. Venous Reflux:  Not evaluated. Other Findings:  None. IMPRESSION: No evidence of DVT involving in the left lower extremity. Electronically Signed   By: Evangeline Dakin M.D.   On: 04/14/2017 15:57    EKG: Independently reviewed. Paced rhythm.  Assessment/Plan Principal Problem:   Exertional dyspnea Active Problems:   Restless legs syndrome (RLS)   Essential hypertension   MYOCARDIAL INFARCTION, HX OF   Sinus node dysfunction (HCC)   Pacemaker -[employer]   CAD (coronary artery disease) S/P CABG    1. Exertional dyspnea with lower extremity edema - possibly secondary to CHF but will rule out ACS. Check 2-D echo. Continue Lasix 20 mg daily for now. Cycle cardiac markers. Continue statins aspirin and beta blocker. 2. COPD - no active wheezing on exam. 3. Hypertension - will continue present medications Imdur metoprolol. 4. History of stroke with intracranial stent placement on aspirin and Plavix and statins. 5. Symptomatic bradycardia and pacemaker. 6. Hypothyroidism on Synthroid. 7. Restless leg syndrome on Mirapex. 8. CAD status post CABG see #1.   DVT prophylaxis: Lovenox. Code Status: Full code.  Family Communication: Discussed with patient.  Disposition Plan: Home.  Consults called: None.  Admission status: Observation.    Rise Patience MD [facility] Pager [phone].  If 7PM-7AM, please contact night-coverage [email or web address] Password [email or web address]  04/14/2017, 9:09 PM

## 2017-04-14 NOTE — ED Triage Notes (Signed)
Pt c/o left leg swelling and SOB x 3 days

## 2017-04-15 ENCOUNTER — Encounter (HOSPITAL_COMMUNITY): Payer: Self-pay | Admitting: Nurse Practitioner

## 2017-04-15 ENCOUNTER — Observation Stay (HOSPITAL_BASED_OUTPATIENT_CLINIC_OR_DEPARTMENT_OTHER): Payer: Medicare HMO

## 2017-04-15 DIAGNOSIS — F329 Major depressive disorder, single episode, unspecified: Secondary | ICD-10-CM | POA: Diagnosis not present

## 2017-04-15 DIAGNOSIS — G2581 Restless legs syndrome: Secondary | ICD-10-CM

## 2017-04-15 DIAGNOSIS — I252 Old myocardial infarction: Secondary | ICD-10-CM | POA: Diagnosis not present

## 2017-04-15 DIAGNOSIS — R0609 Other forms of dyspnea: Secondary | ICD-10-CM

## 2017-04-15 DIAGNOSIS — I200 Unstable angina: Secondary | ICD-10-CM

## 2017-04-15 DIAGNOSIS — Z45018 Encounter for adjustment and management of other part of cardiac pacemaker: Secondary | ICD-10-CM

## 2017-04-15 DIAGNOSIS — F419 Anxiety disorder, unspecified: Secondary | ICD-10-CM | POA: Diagnosis not present

## 2017-04-15 DIAGNOSIS — R0902 Hypoxemia: Secondary | ICD-10-CM

## 2017-04-15 DIAGNOSIS — R55 Syncope and collapse: Secondary | ICD-10-CM

## 2017-04-15 DIAGNOSIS — N183 Chronic kidney disease, stage 3 unspecified: Secondary | ICD-10-CM

## 2017-04-15 DIAGNOSIS — G4700 Insomnia, unspecified: Secondary | ICD-10-CM

## 2017-04-15 DIAGNOSIS — E785 Hyperlipidemia, unspecified: Secondary | ICD-10-CM

## 2017-04-15 DIAGNOSIS — I361 Nonrheumatic tricuspid (valve) insufficiency: Secondary | ICD-10-CM

## 2017-04-15 DIAGNOSIS — J9601 Acute respiratory failure with hypoxia: Secondary | ICD-10-CM

## 2017-04-15 DIAGNOSIS — I10 Essential (primary) hypertension: Secondary | ICD-10-CM | POA: Diagnosis not present

## 2017-04-15 DIAGNOSIS — N390 Urinary tract infection, site not specified: Secondary | ICD-10-CM | POA: Diagnosis not present

## 2017-04-15 DIAGNOSIS — I495 Sick sinus syndrome: Secondary | ICD-10-CM

## 2017-04-15 DIAGNOSIS — I25810 Atherosclerosis of coronary artery bypass graft(s) without angina pectoris: Secondary | ICD-10-CM

## 2017-04-15 DIAGNOSIS — K219 Gastro-esophageal reflux disease without esophagitis: Secondary | ICD-10-CM | POA: Diagnosis not present

## 2017-04-15 LAB — BASIC METABOLIC PANEL
ANION GAP: 9 (ref 5–15)
BUN: 20 mg/dL (ref 6–20)
CALCIUM: 9 mg/dL (ref 8.9–10.3)
CO2: 30 mmol/L (ref 22–32)
CREATININE: 1.44 mg/dL — AB (ref 0.44–1.00)
Chloride: 99 mmol/L — ABNORMAL LOW (ref 101–111)
GFR, EST AFRICAN AMERICAN: 41 mL/min — AB (ref >60)
GFR, EST NON AFRICAN AMERICAN: 36 mL/min — AB (ref >60)
Glucose, Bld: 103 mg/dL — ABNORMAL HIGH (ref 65–99)
Potassium: 4.2 mmol/L (ref 3.5–5.1)
SODIUM: 138 mmol/L (ref 135–145)

## 2017-04-15 LAB — ECHOCARDIOGRAM COMPLETE
CHL CUP DOP CALC LVOT VTI: 24.7 cm
CHL CUP TV REG PEAK VELOCITY: 252 cm/s
E decel time: 239 msec
EERAT: 9.21
FS: 37 % (ref 28–44)
HEIGHTINCHES: 62 in
IVS/LV PW RATIO, ED: 1.01
LA diam end sys: 44 mm
LA vol A4C: 64.8 ml
LADIAMINDEX: 2.27 cm/m2
LASIZE: 44 mm
LV TDI E'MEDIAL: 5.93
LVEEAVG: 9.21
LVEEMED: 9.21
LVELAT: 6.83 cm/s
LVOT area: 3.14 cm2
LVOT diameter: 20 mm
LVOT peak vel: 108 cm/s
LVOTSV: 78 mL
MV Dec: 239
MV pk E vel: 62.9 m/s
MVPKAVEL: 88.1 m/s
PW: 11.3 mm — AB (ref 0.6–1.1)
TDI e' lateral: 6.83
TRMAXVEL: 252 cm/s
WEIGHTICAEL: 3315.72 [oz_av]

## 2017-04-15 LAB — MAGNESIUM: Magnesium: 2.3 mg/dL (ref 1.7–2.4)

## 2017-04-15 LAB — TROPONIN I

## 2017-04-15 LAB — CBC
HCT: 40 % (ref 36.0–46.0)
HEMOGLOBIN: 13.4 g/dL (ref 12.0–15.0)
MCH: 29.8 pg (ref 26.0–34.0)
MCHC: 33.5 g/dL (ref 30.0–36.0)
MCV: 89.1 fL (ref 78.0–100.0)
PLATELETS: 148 10*3/uL — AB (ref 150–400)
RBC: 4.49 MIL/uL (ref 3.87–5.11)
RDW: 13.9 % (ref 11.5–15.5)
WBC: 7.1 10*3/uL (ref 4.0–10.5)

## 2017-04-15 MED ORDER — ALBUTEROL SULFATE (2.5 MG/3ML) 0.083% IN NEBU: 2.5000 mg | INHALATION_SOLUTION | RESPIRATORY_TRACT | Status: DC | PRN

## 2017-04-15 MED ORDER — ALBUTEROL SULFATE (2.5 MG/3ML) 0.083% IN NEBU
2.5000 mg | INHALATION_SOLUTION | Freq: Four times a day (QID) | RESPIRATORY_TRACT | Status: DC
  Administered 2017-04-15 (×2): 2.5 mg via RESPIRATORY_TRACT
  Filled 2017-04-15 (×3): qty 3

## 2017-04-15 MED ORDER — PERFLUTREN LIPID MICROSPHERE
1.0000 mL | INTRAVENOUS | Status: AC | PRN
  Administered 2017-04-15: 2 mL via INTRAVENOUS
  Filled 2017-04-15: qty 10

## 2017-04-15 MED ORDER — DEXTROSE 5 % IV SOLN
1.0000 g | INTRAVENOUS | Status: DC
  Administered 2017-04-15 – 2017-04-16 (×2): 1 g via INTRAVENOUS
  Filled 2017-04-15 (×2): qty 10

## 2017-04-15 MED ORDER — PERFLUTREN LIPID MICROSPHERE
INTRAVENOUS | Status: AC
  Filled 2017-04-15: qty 10

## 2017-04-15 MED ORDER — FUROSEMIDE 10 MG/ML IJ SOLN
20.0000 mg | Freq: Every day | INTRAMUSCULAR | Status: DC
  Filled 2017-04-15: qty 2

## 2017-04-15 NOTE — Consult Note (Signed)
Cardiology Consult    Patient ID: JAZMYNN PHO MRN: [id number], DOB/AGE: 71/15/1947   Admit date: 04/14/2017 Date of Consult: 04/15/2017  Primary Physician: Marry Guan Primary Cardiologist: Jerilynn Mages. Marlou Porch, MD / G. Lovena Le, MD  Requesting Provider: Jenetta Downer. Sheikh  Patient Profile    [name] is a 71 y.o. female with a history of CAD s/p CABG x 4 (2008), HTN, HL, remote tob abuse, COPD, CKD III, stroke, carotid arterial dzs, and syncope, who is being seen today for the evaluation of dyspnea/hypoxia at the request of Dr. Alfredia Ferguson.  Past Medical History   Past Medical History:  Diagnosis Date  . Anxiety   . Blood transfusion without reported diagnosis    10/29/2006  . Bulging disc   . Carotid arterial disease (HCC)    a. s/p L intracranial ICA stenting ([place]).  . Cataract 05/2012  . Chronic kidney disease, stage III (moderate)   . COPD (chronic obstructive pulmonary disease) ([place])    a. quit smoking in 2007.  Marland Kitchen Coronary artery disease    a. 2007 s/p MI and LAD stenting;  10/2006 CABG x 4: LIMA->LAD, VG->OM->dLCX, VG->RPL;  c. 07/2013 MV: no ischemia.  . Depression   . Esophageal reflux   . History of echocardiogram    a. 01/2012 Echo: EF 55%, mildly dil LA/RV, mild MR, Ao Sclerosis, mod TR.  Marland Kitchen Hypothyroid   . Insomnia   . Macular degeneration   . Mixed hyperlipidemia   . Osteoporosis   . PVT (paroxysmal ventricular tachycardia) ([place])   . Restless leg syndrome   . Stroke ([place])   . Symptomatic bradycardia    a. s/p BSX Advantio dual chamber PPM Beckie Salts).  . Syncope and collapse   . Unspecified hearing loss     Past Surgical History:  Procedure Laterality Date  . CARDIAC CATHETERIZATION  2007   x2;[facility]  . CARDIAC CATHETERIZATION  2008  . CATARACT EXTRACTION Right   . CEREBRAL ANGIOGRAM    . colonoscopy    . CORONARY ARTERY BYPASS GRAFT     x 4  . EYE SURGERY  05/2013   cataract removed  . GALLBLADDER SURGERY    . INSERT / REPLACE / REMOVE PACEMAKER   2013   [place], [place]; [employer]  . KNEE ARTHROSCOPY    . LUMBAR DISC SURGERY    . PARTIAL HYSTERECTOMY    . SPINE SURGERY  1999/2001/2006   3 disc replaced     Allergies  Allergies  Allergen Reactions  . Codeine     swelling  . Iohexol      Desc: PT HAD CONTRAST REACTION YRS AGO NEEDS 13 HR PREP FOR ALL CONTRAST STUDIES     History of Present Illness    71 year old female with the above complex past medical history including coronary artery disease, hypertension, hyperlipidemia, obesity, stroke, carotid arterial disease, stage III chronic kidney disease, COPD, remote tobacco abuse, and syncope. Cardiac history dates back to 2007, at which time she suffered a myocardial infarction and underwent LAD stenting. 2008, showed recurrent chest discomfort and required diagnostic catheterization revealing multivessel disease. She then underwent coronary artery bypass grafting 4. She has been followed by multiple cardiologists in our practice and is currently seeing Dr. Marlou Porch.  Her last stress test was in 2014, and was nonischemic.  She also has a history of symptomatically bradycardia and is status post Psychologist, clinical pacemaker. This is followed by Dr. Lovena Le.   She says that over the  past 8-12 months, she has been experiencing exertional chest discomfort and dyspnea about 1 or 2 times per week, occurring usually with heavier levels of activity or carrying a load, lasting about 5-10 minutes, and resolving with rest. Over the past 6 months, she has noticed dependent lower extremity swelling, that is worse in the evening and absent in the morning. She is on chronic low-dose Lasix at home. She weighs herself periodically and says that her weight has been up and down. This is confirmed by her weight trend dating back 2015 in [facility].  She has not been experiencing any orthopnea, PND, or early satiety.   About one month ago, she awoke in the middle of the  night to use the bathroom. She says that she suddenly lost consciousness without any prodrome. She fell into her shower area. She is not sure how long she was without consciousness. She did not suffer any significant trauma. She did have trouble getting up afterwards but eventually got back to bed. She did not seek medical attention for this.  She is currently living at a retirement facility in the independent section. She has chronic knee pain and is receiving physical therapy twice a week. She was evaluated on Tuesday of this week and felt somewhat dehydrated she was advised to increase her oral fluid intake. Upon reevaluation on July 19, she was noted to be staggering some when she was walking and there was also some question as to whether or not she was breathing heavily. Patient says she didn't feel like she was breathing heavily. Regardless, the home health nurse spoke to her physician and she was advised to present to the ED. She was taken to the med Center at [facility] where lab work was relatively unrevealing. BNP was only mildly elevated at 137.2. Chest x-ray and EKG were nonacute. Troponin was normal. After ambulation, she was noted to be hypoxic. She was treated with 1 dose of IV Lasix and  was transferred to [facility] for admission.  Since admission, troponins have remained normal. She denies recurrent chest pain or dyspnea, though breathing does seem somewhat labored with talking.  Inpatient Medications    . albuterol  2.5 mg Nebulization QID  . ALPRAZolam  1 mg Oral QHS  . aspirin EC  81 mg Oral Daily  . atorvastatin  20 mg Oral Daily  . clopidogrel  75 mg Oral Daily  . enoxaparin (LOVENOX) injection  40 mg Subcutaneous Q24H  . ferrous sulfate  325 mg Oral Q breakfast  . folic acid  1 mg Oral Daily  . isosorbide mononitrate  60 mg Oral Daily  . levothyroxine  75 mcg Oral QAC breakfast  . magnesium oxide  200 mg Oral Daily  . metoprolol tartrate  25 mg Oral BID  . pantoprazole   40 mg Oral Daily  . pramipexole  2 mg Oral Daily  . sertraline  200 mg Oral QHS  . traZODone  200 mg Oral QHS    Family History    Family History  Problem Relation Age of Onset  . Stomach cancer Mother   . Diabetes Mother   . Hyperlipidemia Mother   . Hypertension Father   . Lung cancer Father   . Alcohol abuse Father   . Early death Brother     Social History    Social History   Social History  . Marital status: Married    Spouse name: N/A  . Number of children: N/A  .  Years of education: N/A   Occupational History  . retired     was ECG tech @ [employer]   Social History Main Topics  . Smoking status: Former Smoker    Packs/day: 0.75    Years: 40.00    Types: Cigarettes    Start date: 09/28/1965    Quit date: 05/27/2006  . Smokeless tobacco: Never Used  . Alcohol use No  . Drug use: No  . Sexual activity: Not Currently   Other Topics Concern  . Not on file   Social History Narrative   Lives in independent section of a retirement community.  Does not routinely exercise.     Review of Systems    General:  No chills, fever, night sweats or weight changes.  Cardiovascular:  +++ ex chest pain, +++ dyspnea on exertion, +++ dependent edema - better after lying down, no orthopnea, palpitations, paroxysmal nocturnal dyspnea. Dermatological: No rash, lesions/masses Respiratory: No cough, +++ dyspnea Urologic: No hematuria, dysuria Abdominal:   No nausea, vomiting, diarrhea, bright red blood per rectum, melena, or hematemesis Neurologic:  No visual changes, wkns, changes in mental status. All other systems reviewed and are otherwise negative except as noted above.  Physical Exam    Blood pressure 132/78, pulse 76, temperature 98.3 F (36.8 C), temperature source Oral, resp. rate 18, height 5\' 2"  (1.575 m), weight 207 lb 3.7 oz (94 kg), SpO2 100 %.  General: Pleasant, NAD Psych: Normal affect. Neuro: Alert and oriented X 3. Moves all extremities  spontaneously. HEENT: Normal  Neck: Supple without bruits or JVD. Lungs:  Resp regular and unlabored, CTA.With speech, breathing becomes labored and there is a component of upper airway. Wheezing. Heart: RRR no s3, s4, or murmurs. Abdomen: Soft, non-tender, non-distended, BS + x 4.  Extremities: No clubbing, cyanosis or edema. DP/PT/Radials 2+ and equal bilaterally.  Labs     Recent Labs  04/14/17 1421 04/14/17 2212 04/15/17 0326 04/15/17 0805  TROPONINI <0.03 <0.03 <0.03 <0.03   Lab Results  Component Value Date   WBC 7.1 04/15/2017   HGB 13.4 04/15/2017   HCT 40.0 04/15/2017   MCV 89.1 04/15/2017   PLT 148 (L) 04/15/2017    Recent Labs Lab 04/14/17 1421  04/15/17 0326  NA 136  --  138  K 4.5  --  4.2  CL 97*  --  99*  CO2 29  --  30  BUN 17  --  20  CREATININE 1.27*  < > 1.44*  CALCIUM 8.9  --  9.0  PROT 7.6  --   --   BILITOT 0.6  --   --   ALKPHOS 114  --   --   ALT 37  --   --   AST 35  --   --   GLUCOSE 84  --  103*  < > = values in this interval not displayed. Lab Results  Component Value Date   CHOL 149 12/12/2015   HDL 94 12/12/2015   LDLCALC 44 12/12/2015   TRIG 54 12/12/2015   Lab Results  Component Value Date   DDIMER 0.27 04/14/2017     Radiology Studies    Dg Chest 2 View  Result Date: 04/14/2017 CLINICAL DATA:  Shob, leg swelling, fall and hit head last night. EXAM: CHEST  2 VIEW COMPARISON:  Chest x-ray dated 11/21/2008. FINDINGS: Study is slightly hypoinspiratory with crowding of the perihilar and bibasilar bronchovascular markings. Given the low lung volumes, lungs appear clear. No pleural effusion  or pneumothorax seen. Left chest wall dual lead pacemaker in place without evidence of wire discontinuity. Median sternotomy wires appear intact and stable alignment. No acute or suspicious osseous finding. IMPRESSION: No active cardiopulmonary disease. No evidence of pneumonia or pulmonary edema. Electronically Signed   By: Franki Cabot M.D.    On: 04/14/2017 15:18   Ct Head Wo Contrast  Result Date: 04/14/2017 CLINICAL DATA:  Pt keeps falling. Having problems w/balance. fell last night and "bumped" her head. [place] one mo ago. Hx c/s fusion. EXAM: CT HEAD WITHOUT CONTRAST CT CERVICAL SPINE WITHOUT CONTRAST TECHNIQUE: Multidetector CT imaging of the head and cervical spine was performed following the standard protocol without intravenous contrast. Multiplanar CT image reconstructions of the cervical spine were also generated. COMPARISON:  Head CT, 08/26/2008 FINDINGS: CT HEAD FINDINGS Brain: No evidence of acute infarction, hemorrhage, hydrocephalus, extra-axial collection or mass lesion/mass effect. The ventricles and sulci are mildly enlarged reflecting mild generalized atrophy. Vascular: There is a stable left middle cerebral artery stent. Skull: Normal. Negative for fracture or focal lesion. Sinuses/Orbits: Dependent mucosal thickening or secretions in the right maxillary sinus. Visualized sinuses otherwise clear. Mastoid air cells are clear. Other: None. CT CERVICAL SPINE FINDINGS Alignment: No spondylolisthesis. Slight reversal the normal cervical lordosis, apex at C5. Skull base and vertebrae: No acute fracture. No osteoblastic or osteolytic lesions. Soft tissues and spinal canal: No prevertebral fluid or swelling. No visible canal hematoma. Disc levels: Previous spinal fusion. Bony fusion is seen at C4-C5 and C6. There is also bony fusion across the C6-C7 disc interspace. An anterior fusion plate with screws extends across the C6-C7. The orthopedic hardware is well-seated with no evidence of loosening. Bone graft material is mature. Mild loss of disc height with endplate spurring at F0-Y7. Mild disc bulging. No convincing disc herniation. No significant stenosis. Mild to moderate loss of disc height at C7-T1. No disc herniation or significant stenosis. Upper chest: Unremarkable. Other: None. IMPRESSION: HEAD CT: No acute intracranial  abnormalities. Stable left middle cerebral artery stent. No skull fracture. CERVICAL CT: No fracture or acute finding. Mature changes from previous fusion from C4 through C7. Electronically Signed   By: Lajean Manes M.D.   On: 04/14/2017 15:43   Ct Cervical Spine Wo Contrast  Result Date: 04/14/2017 CLINICAL DATA:  Pt keeps falling. Having problems w/balance. fell last night and "bumped" her head. [place] one mo ago. Hx c/s fusion. EXAM: CT HEAD WITHOUT CONTRAST CT CERVICAL SPINE WITHOUT CONTRAST TECHNIQUE: Multidetector CT imaging of the head and cervical spine was performed following the standard protocol without intravenous contrast. Multiplanar CT image reconstructions of the cervical spine were also generated. COMPARISON:  Head CT, 08/26/2008 FINDINGS: CT HEAD FINDINGS Brain: No evidence of acute infarction, hemorrhage, hydrocephalus, extra-axial collection or mass lesion/mass effect. The ventricles and sulci are mildly enlarged reflecting mild generalized atrophy. Vascular: There is a stable left middle cerebral artery stent. Skull: Normal. Negative for fracture or focal lesion. Sinuses/Orbits: Dependent mucosal thickening or secretions in the right maxillary sinus. Visualized sinuses otherwise clear. Mastoid air cells are clear. Other: None. CT CERVICAL SPINE FINDINGS Alignment: No spondylolisthesis. Slight reversal the normal cervical lordosis, apex at C5. Skull base and vertebrae: No acute fracture. No osteoblastic or osteolytic lesions. Soft tissues and spinal canal: No prevertebral fluid or swelling. No visible canal hematoma. Disc levels: Previous spinal fusion. Bony fusion is seen at C4-C5 and C6. There is also bony fusion across the C6-C7 disc interspace. An anterior fusion plate with screws  extends across the C6-C7. The orthopedic hardware is well-seated with no evidence of loosening. Bone graft material is mature. Mild loss of disc height with endplate spurring at N8-G9. Mild disc bulging. No  convincing disc herniation. No significant stenosis. Mild to moderate loss of disc height at C7-T1. No disc herniation or significant stenosis. Upper chest: Unremarkable. Other: None. IMPRESSION: HEAD CT: No acute intracranial abnormalities. Stable left middle cerebral artery stent. No skull fracture. CERVICAL CT: No fracture or acute finding. Mature changes from previous fusion from C4 through C7. Electronically Signed   By: Lajean Manes M.D.   On: 04/14/2017 15:43   US Venous Img Lower Unilateral Left  Result Date: 04/14/2017 CLINICAL DATA:  Chronic left lower extremity pain for several months. Prior MI and stroke. EXAM: LEFT LOWER EXTREMITY VENOUS DOPPLER ULTRASOUND TECHNIQUE: Gray-scale sonography with graded compression, as well as color Doppler and duplex ultrasound were performed to evaluate the lower extremity deep venous systems from the level of the common femoral vein and including the common femoral, femoral, profunda femoral, popliteal and calf veins including the posterior tibial, peroneal and gastrocnemius veins when visible. The superficial great saphenous vein was also interrogated. Spectral Doppler was utilized to evaluate flow at rest and with distal augmentation maneuvers in the common femoral, femoral and popliteal veins. COMPARISON:  05/15/2008. FINDINGS: Contralateral Right Common Femoral Vein: Respiratory phasicity is normal and symmetric with the symptomatic side. No evidence of thrombus. Normal compressibility. Left Common Femoral Vein: No evidence of thrombus. Normal compressibility, respiratory phasicity and response to augmentation. Saphenofemoral Junction: No evidence of thrombus. Normal compressibility and flow on color Doppler imaging. Profunda Femoral Vein: No evidence of thrombus. Normal compressibility and flow on color Doppler imaging. Femoral Vein: No evidence of thrombus. Normal compressibility, respiratory phasicity and response to augmentation. Popliteal Vein: No evidence  of thrombus. Normal compressibility, respiratory phasicity and response to augmentation. Calf Veins: No evidence of thrombus. Normal compressibility and flow on color Doppler imaging. Superficial Great Saphenous Vein: No evidence of thrombus. Normal compressibility and flow on color Doppler imaging. Venous Reflux:  Not evaluated. Other Findings:  None. IMPRESSION: No evidence of DVT involving in the left lower extremity. Electronically Signed   By: Evangeline Dakin M.D.   On: 04/14/2017 15:57    ECG & Cardiac Imaging     AV paced, 70, right axis deviation, nonspecific ST and T changes.  Assessment & Plan    1. Acute on chronic hypoxic respiratory failure: Patient was noted to be short of breath by home health nurse on July 19 prompting presentation to [facility] There, labs were relatively unrevealing. BNP was only mildly elevated. Troponins were normal. EKG was nonacute. Chest x-ray did not show any acute cardiopulmonary disease. She was noted to be hypoxic with ambulation and was thus treated with IV Lasix. She is -1.6 L since admission but did bump her creatinine from 1.27 on admission to 1.44 this morning. She did not have any evidence of significant volume overload on exam. Her weights 7 relatively stable at home. She denies any history of orthopnea or PND but does note lower extremity swelling. Swelling occurs as she is on her feet throughout the day and is not present first thing in the morning suggesting more venous insufficiency than heart failure per se. Though her lungs are clear, when she speaks, she does appear to be short of breath and there is a component of upper airway wheezing. She is being treated with antibiotics. She also  uses inhalers at home. She does also report a history of exertional chest discomfort and dyspnea. Await 2-D echocardiogram. Pending results, she will need an inpatient versus outpatient ischemic evaluation.   2. Unstable angina/coronary artery disease:  Patient reports an 8-12 month history of exertional chest pressure associated with dyspnea. She is status post CABG 4 in 2008 and her last stress test was in 2014. As above, await echocardiogram. If EF is down, we would have a low threshold to pursue diagnostic catheterization. If however EF is normal, we would likely pursue inpatient versus outpatient stress testing with early cardiology follow-up. Continue aspirin, statin, Plavix, nitrate, and beta blocker therapy.  3. Essential hypertension: Blood pressure stable.  4. Stage III chronic kidney disease: Creatinine bumped slightly after 1 dose of IV Lasix. I suspect she was not significantly volume overloaded to start out. I'm going to discontinue her IV Lasix and complained resume her by mouth Lasix tomorrow.   5. History of symptom bradycardia: Status post [employer] scientific pacemaker. She recently had syncope about a month ago and we will interrogate her device to look for tachyarrhythmias.   6. Syncope: Patient had a sudden syncopal episode about one month ago without any prodrome. She did not seek medical care at that time. We will interrogate her pacemaker to assess for any tachyarrhythmias.   7.  COPD:  Pt with long smoking hx, quit in 2007.  Lungs are clear but she does have a mild upper airway wheeze.  Abx per IM.  Resume albuterol.   Signed, Murray Hodgkins, NP 04/15/2017, 10:30 AM  History and all data above reviewed.  Patient examined.  I agree with the findings as above.  The patient has multiple complaints including chest pain several days ago.  She also has had increasing dyspnea.  She did have an abnormal EKG with pacing spikes on the QRS. The patient exam reveals COR:RRR  ,  Lungs: Clear  ,  Abd: Positive bowel sounds, no rebound no guarding, Ext No edema  .  All available labs, radiology testing, previous records reviewed. Agree with documented assessment and plan. Dyspnea:  She does not appear to be markedly volume overloaded.   She has now bumped her creat with diuresis.  Agree with no further aggressive diuresis.  I don't strongly suspect ischemia.  Plan echo and likely out patient ischemia work up.  Syncope:  She had an episode of this about a month ago.  However, her device was interrogated today and it has normal function.  There were no reported tachy events. She does have a prolonged QT.  I will check mag level.  No arrhythmias however noted.    Jeneen Rinks Loghan Kurtzman  12:59 PM  04/15/2017

## 2017-04-15 NOTE — Progress Notes (Signed)
PROGRESS NOTE    [name]  PYP:[id number] DOB: 10-Mar-1946 DOA: 04/14/2017 PCP: Marry Guan  Brief Narrative:  [name] is a 71 y.o. female with history of CAD status post CABG, symptomatic bradycardia status post pacemaker placement, hypertension, COPD, stroke and hypothyroidism and other comorbids who presented to the ER at [facility] with complaints of shortness of breath. Patient states that over the last 1 month patient has been having increasing exertional shortness of breath limiting her movements. Patient usually walks around with walker. She had a fall last month. Patient denies any chest pain and productive cough fever or chills but has had on and off Chest pressure. In the ER chest x-ray was unremarkable. BNP is minimally elevated. Since patient also complained of left lower extremity swelling patient had Dopplers which was negative for DVT. Given the symptoms patient was given Lasix 20 mg IV in the ER and admitted for further management of exertional dyspnea likely from CHF versus to rule out ACS. Since patient had fall CT of the head and C-spine were done which were unremarkable. Cardiology was consulted for evaluation and further recommendations. ECHOCardiogram was obtained. Will discuss with Cardiology about further recc's and likely D/C in AM after PT consultation.   Assessment & Plan:   Principal Problem:   Exertional dyspnea Active Problems:   Hyperlipidemia   Anxiety   Depression   Restless legs syndrome (RLS)   Essential hypertension   MYOCARDIAL INFARCTION, HX OF   GERD   INSOMNIA   Dyspnea on exertion   Sinus node dysfunction (HCC)   Pacemaker -[employer]   CAD (coronary artery disease) S/P CABG   Acute lower UTI   Syncope and collapse   CKD (chronic kidney disease) stage 3, GFR 30-59 ml/min  Exertional Dyspnea/Chest Pressure/Chest Pain with lower Extremity Edema  -Lower Extremity Venous Doppler Negative for DVT in the Left  Lower Extremity  -Possibly secondary to CHF but will rule out ACS.  -ECHOCardiogram done and showed EF of 60-65% with Normal Wall motion and no Regional Wall motion abnormalities. Did however show Grade 1 DD.  -Cardiac Troponin I was <0.03 x 4 -BNP was mildly elevated at 137.2 -Cardiology Consulted for further evaluation and recommendations -IV Lasix 20 mg daily discontinued by Cardiology given bump in SCr -D-Dimer was <0.27 and 0.27 -Cardiology does not strongly suspect ischemia -C/w ASA 81 mg po Daily, Atorvastatin 20 mg po Daily, Clopidogrel 75 mg po Daily, Isosorbide Mononitrate 60 mg po Daily, Metoprolol 25 mg po BID -EKG was abnormal with pacing Spikes -Will discuss with Cardiology in AM, suspect outpatient stress testing and ischemia work up with Cardiology follow Up -[place] done and patient did not qualify for Home O2 however she was Hypoxic on Ambulation on admission. -PT/OT Consult    COPD  -No active wheezing on exam. -C/w Albuterol 2.5 mg 4 Times Daily   Hypertension  -will continue present medications Imdur metoprolol.  History of Stroke with intracranial stent placement  C/w ASA 81 mg po Daily, Atorvastatin 20 mg po Daily, Clopidogrel 75 mg po Daily,  Hx of Symptomatic Bradycardia s/p Pacemaker -Continue with Telemetry  -Per Cards they are going to interrogate Pacemaker to assess for any Tachyarrhythmias; Interrogation revealed normal function with no reported tachy events   Hypothyroidism -Check TSH and Free T4 -C/w Levothyroxine 75 mcg po Daily  Restless Leg Syndrome -C/w Pramipexole 2 mg po Daily  CAD status post CABG x4 -C/w ASA 81 mg po Daily,  Atorvastatin 20 mg po Daily, Clopidogrel 75 mg po Daily, Isosorbide Mononitrate 60 mg po Daily, Metoprolol 25 mg po BID and NTG 0.4 mg SL q5hmin PRN -No ACE/ARB because of CKD  HLD -C/w Atorvastatin 20 mg po Daily  CKD Stage 3 -Given IV Lasix and BUN/Cr went from 17/1.27 -> 20/1.44 -IV Lasix now D/C'd -Avoid  Nephrotoxic Medications -Repeat CMP in AM  Recent Syncope and Fall -UA showed ? UTI -Head and Cervical CT showed No acute intracranial abnormalities. Stable left middle cerebral artery stent. No skull fracture. Cervical CT showed No fracture or acute finding. Mature changes from previous fusion from C4 through C7 -ECHOCardiogram as below -Troponin <0.03 x 4 -Cardiology interrogated Pacemaker to assess for any tachyarrythmias and was normal -Will obtain PT/OT Consultation   ?UTI -Urinalysis showed Few Bacteria, Small Hb, Large Leukocytes, Negative Nitrites, and 6-30 WBC -Urine Cx Pending result -Patient was started on Empiric Abx with IV Ceftriaxone  Depression/Anxiety -C/w Sertraline 200 mg po qHS, Alprazolam 1 mg po qHS, Trazadone 200 mg po qHS  Esophageal Reflux -C/w Pantoprazole 40 mg po Daily  Insomnia  -C/w Diphenhydramine 50 mg po QHSprn and Trazdone 200 mg po qHS  DVT prophylaxis: Enoxaparin 40 mg sq q24h Code Status: FULL CODE Family Communication: No family present at bedside Disposition Plan: Likely Home with Home Health in AM  Consultants:   Cardiology Dr. Percival Spanish    Procedures: ECHOCARDIOGRAM Study Conclusions  - Left ventricle: The cavity size was normal. There was mild   concentric hypertrophy. Systolic function was normal. The   estimated ejection fraction was in the range of 60% to 65%. Wall   motion was normal; there were no regional wall motion   abnormalities. Doppler parameters are consistent with abnormal   left ventricular relaxation (grade 1 diastolic dysfunction).   There was no evidence of elevated ventricular filling pressure by   Doppler parameters. - Aortic valve: There was no regurgitation. - Mitral valve: There was no regurgitation. - Left atrium: The atrium was mildly dilated. - Right ventricle: The cavity size was moderately dilated. Wall   thickness was normal. Systolic function was moderately reduced. - Tricuspid valve: There  was severe regurgitation. - Pulmonary arteries: Systolic pressure was mildly increased. PA   peak pressure: 33 mm Hg (S). - Inferior vena cava: The vessel was dilated. The respirophasic   diameter changes were blunted (< 50%), consistent with elevated   central venous pressure.   Antimicrobials:  Anti-infectives    Start     Dose/Rate Route Frequency Ordered Stop   04/15/17 0800  cefTRIAXone (ROCEPHIN) 1 g in dextrose 5 % 50 mL IVPB     1 g 100 mL/hr over 30 Minutes Intravenous Every 24 hours 04/15/17 0737       Subjective: Seen and examined and was still dyspneic even during conversation. No nausea or vomiting. States she has had chest pressure and swelling but none now. No lightheadedness or dizziness. States she fell recently and cannot ambulate long distances without getting SOB. No other complaints or concerns at this time.   Objective: Vitals:   04/14/17 1940 04/15/17 0522 04/15/17 1129 04/15/17 1258  BP: 136/67 132/78  109/62  Pulse: 70 76  71  Resp: 16 18  16   Temp: 98.4 F (36.9 C) 98.3 F (36.8 C)  97.6 F (36.4 C)  TempSrc: Oral Oral  Oral  SpO2: 100% 100% 95% 100%  Weight: 96.3 kg (212 lb 4.9 oz) 94 kg (207 lb 3.7 oz)  Height: 5\' 2"  (1.575 m)       Intake/Output Summary (Last 24 hours) at 04/15/17 1800 Last data filed at 04/15/17 1258  Gross per 24 hour  Intake              770 ml  Output             2150 ml  Net            -1380 ml   Filed Weights   04/14/17 1354 04/14/17 1940 04/15/17 0522  Weight: 89.4 kg (197 lb) 96.3 kg (212 lb 4.9 oz) 94 kg (207 lb 3.7 oz)   Examination: Physical Exam:  Constitutional: WN/WD obese Caucasian female who appears dyspenic during conversation  Eyes: Lids and conjunctivae normal, sclerae anicteric  ENMT: External Ears, Nose appear normal. Grossly normal hearing. Mucous membranes are moist. Neck: Appears normal, supple, no cervical masses, normal ROM, no appreciable thyromegaly, no JVD Respiratory: Diminished to  auscultation bilaterally, no appreciable wheezing, rales, rhonchi or crackles. Increased respiratory effort as the patient is tachypenic with speaking. Some accessory muscle use.  Cardiovascular: RRR, no murmurs / rubs / gallops. S1 and S2 auscultated. No  Appreciable extremity edema. 2+ pedal pulses. Abdomen: Soft, non-tender, distended due to body habitus. No masses palpated. No appreciable hepatosplenomegaly. Bowel sounds positive x4.  GU: Deferred. Musculoskeletal: No clubbing / cyanosis of digits/nails. No joint deformity upper and lower extremities. Skin: No rashes, lesions, ulcers on limited skin evaluation. No induration; Warm and dry.  Neurologic: CN 2-12 grossly intact with no focal deficits. Sensation intact in all 4 Extremities. Romberg sign cerebellar reflexes not assessed.  Psychiatric: Normal judgment and insight. Alert and oriented x 3. Normal mood and appropriate affect.   Data Reviewed: I have personally reviewed following labs and imaging studies  CBC:  Recent Labs Lab 04/14/17 1421 04/14/17 2212 04/15/17 0326  WBC 5.8 7.2 7.1  NEUTROABS 3.4  --   --   HGB 13.4 14.0 13.4  HCT 40.1 41.0 40.0  MCV 90.3 86.9 89.1  PLT 157 151 732*   Basic Metabolic Panel:  Recent Labs Lab 04/14/17 1421 04/14/17 2212 04/15/17 0326 04/15/17 0755  NA 136  --  138  --   K 4.5  --  4.2  --   CL 97*  --  99*  --   CO2 29  --  30  --   GLUCOSE 84  --  103*  --   BUN 17  --  20  --   CREATININE 1.27* 1.38* 1.44*  --   CALCIUM 8.9  --  9.0  --   MG  --   --   --  2.3   GFR: Estimated Creatinine Clearance: 38.3 mL/min (A) (by C-G formula based on SCr of 1.44 mg/dL (H)). Liver Function Tests:  Recent Labs Lab 04/14/17 1421  AST 35  ALT 37  ALKPHOS 114  BILITOT 0.6  PROT 7.6  ALBUMIN 4.1   No results for input(s): LIPASE, AMYLASE in the last 168 hours. No results for input(s): AMMONIA in the last 168 hours. Coagulation Profile: No results for input(s): INR, PROTIME in  the last 168 hours. Cardiac Enzymes:  Recent Labs Lab 04/14/17 1421 04/14/17 2212 04/15/17 0326 04/15/17 0805  TROPONINI <0.03 <0.03 <0.03 <0.03   BNP (last 3 results) No results for input(s): PROBNP in the last 8760 hours. HbA1C: No results for input(s): HGBA1C in the last 72 hours. CBG: No results for input(s): GLUCAP in the last  168 hours. Lipid Profile: No results for input(s): CHOL, HDL, LDLCALC, TRIG, CHOLHDL, LDLDIRECT in the last 72 hours. Thyroid Function Tests: No results for input(s): TSH, T4TOTAL, FREET4, T3FREE, THYROIDAB in the last 72 hours. Anemia Panel: No results for input(s): VITAMINB12, FOLATE, FERRITIN, TIBC, IRON, RETICCTPCT in the last 72 hours. Sepsis Labs: No results for input(s): PROCALCITON, LATICACIDVEN in the last 168 hours.  No results found for this or any previous visit (from the past 240 hour(s)).   Radiology Studies: Dg Chest 2 View  Result Date: 04/14/2017 CLINICAL DATA:  Shob, leg swelling, fall and hit head last night. EXAM: CHEST  2 VIEW COMPARISON:  Chest x-ray dated 11/21/2008. FINDINGS: Study is slightly hypoinspiratory with crowding of the perihilar and bibasilar bronchovascular markings. Given the low lung volumes, lungs appear clear. No pleural effusion or pneumothorax seen. Left chest wall dual lead pacemaker in place without evidence of wire discontinuity. Median sternotomy wires appear intact and stable alignment. No acute or suspicious osseous finding. IMPRESSION: No active cardiopulmonary disease. No evidence of pneumonia or pulmonary edema. Electronically Signed   By: Franki Cabot M.D.   On: 04/14/2017 15:18   Ct Head Wo Contrast  Result Date: 04/14/2017 CLINICAL DATA:  Pt keeps falling. Having problems w/balance. fell last night and "bumped" her head. [place] one mo ago. Hx c/s fusion. EXAM: CT HEAD WITHOUT CONTRAST CT CERVICAL SPINE WITHOUT CONTRAST TECHNIQUE: Multidetector CT imaging of the head and cervical spine was performed  following the standard protocol without intravenous contrast. Multiplanar CT image reconstructions of the cervical spine were also generated. COMPARISON:  Head CT, 08/26/2008 FINDINGS: CT HEAD FINDINGS Brain: No evidence of acute infarction, hemorrhage, hydrocephalus, extra-axial collection or mass lesion/mass effect. The ventricles and sulci are mildly enlarged reflecting mild generalized atrophy. Vascular: There is a stable left middle cerebral artery stent. Skull: Normal. Negative for fracture or focal lesion. Sinuses/Orbits: Dependent mucosal thickening or secretions in the right maxillary sinus. Visualized sinuses otherwise clear. Mastoid air cells are clear. Other: None. CT CERVICAL SPINE FINDINGS Alignment: No spondylolisthesis. Slight reversal the normal cervical lordosis, apex at C5. Skull base and vertebrae: No acute fracture. No osteoblastic or osteolytic lesions. Soft tissues and spinal canal: No prevertebral fluid or swelling. No visible canal hematoma. Disc levels: Previous spinal fusion. Bony fusion is seen at C4-C5 and C6. There is also bony fusion across the C6-C7 disc interspace. An anterior fusion plate with screws extends across the C6-C7. The orthopedic hardware is well-seated with no evidence of loosening. Bone graft material is mature. Mild loss of disc height with endplate spurring at P6-P9. Mild disc bulging. No convincing disc herniation. No significant stenosis. Mild to moderate loss of disc height at C7-T1. No disc herniation or significant stenosis. Upper chest: Unremarkable. Other: None. IMPRESSION: HEAD CT: No acute intracranial abnormalities. Stable left middle cerebral artery stent. No skull fracture. CERVICAL CT: No fracture or acute finding. Mature changes from previous fusion from C4 through C7. Electronically Signed   By: Lajean Manes M.D.   On: 04/14/2017 15:43   Ct Cervical Spine Wo Contrast  Result Date: 04/14/2017 CLINICAL DATA:  Pt keeps falling. Having problems  w/balance. fell last night and "bumped" her head. [place] one mo ago. Hx c/s fusion. EXAM: CT HEAD WITHOUT CONTRAST CT CERVICAL SPINE WITHOUT CONTRAST TECHNIQUE: Multidetector CT imaging of the head and cervical spine was performed following the standard protocol without intravenous contrast. Multiplanar CT image reconstructions of the cervical spine were also generated. COMPARISON:  Head CT,  08/26/2008 FINDINGS: CT HEAD FINDINGS Brain: No evidence of acute infarction, hemorrhage, hydrocephalus, extra-axial collection or mass lesion/mass effect. The ventricles and sulci are mildly enlarged reflecting mild generalized atrophy. Vascular: There is a stable left middle cerebral artery stent. Skull: Normal. Negative for fracture or focal lesion. Sinuses/Orbits: Dependent mucosal thickening or secretions in the right maxillary sinus. Visualized sinuses otherwise clear. Mastoid air cells are clear. Other: None. CT CERVICAL SPINE FINDINGS Alignment: No spondylolisthesis. Slight reversal the normal cervical lordosis, apex at C5. Skull base and vertebrae: No acute fracture. No osteoblastic or osteolytic lesions. Soft tissues and spinal canal: No prevertebral fluid or swelling. No visible canal hematoma. Disc levels: Previous spinal fusion. Bony fusion is seen at C4-C5 and C6. There is also bony fusion across the C6-C7 disc interspace. An anterior fusion plate with screws extends across the C6-C7. The orthopedic hardware is well-seated with no evidence of loosening. Bone graft material is mature. Mild loss of disc height with endplate spurring at D7-O2. Mild disc bulging. No convincing disc herniation. No significant stenosis. Mild to moderate loss of disc height at C7-T1. No disc herniation or significant stenosis. Upper chest: Unremarkable. Other: None. IMPRESSION: HEAD CT: No acute intracranial abnormalities. Stable left middle cerebral artery stent. No skull fracture. CERVICAL CT: No fracture or acute finding. Mature changes  from previous fusion from C4 through C7. Electronically Signed   By: Lajean Manes M.D.   On: 04/14/2017 15:43   US Venous Img Lower Unilateral Left  Result Date: 04/14/2017 CLINICAL DATA:  Chronic left lower extremity pain for several months. Prior MI and stroke. EXAM: LEFT LOWER EXTREMITY VENOUS DOPPLER ULTRASOUND TECHNIQUE: Gray-scale sonography with graded compression, as well as color Doppler and duplex ultrasound were performed to evaluate the lower extremity deep venous systems from the level of the common femoral vein and including the common femoral, femoral, profunda femoral, popliteal and calf veins including the posterior tibial, peroneal and gastrocnemius veins when visible. The superficial great saphenous vein was also interrogated. Spectral Doppler was utilized to evaluate flow at rest and with distal augmentation maneuvers in the common femoral, femoral and popliteal veins. COMPARISON:  05/15/2008. FINDINGS: Contralateral Right Common Femoral Vein: Respiratory phasicity is normal and symmetric with the symptomatic side. No evidence of thrombus. Normal compressibility. Left Common Femoral Vein: No evidence of thrombus. Normal compressibility, respiratory phasicity and response to augmentation. Saphenofemoral Junction: No evidence of thrombus. Normal compressibility and flow on color Doppler imaging. Profunda Femoral Vein: No evidence of thrombus. Normal compressibility and flow on color Doppler imaging. Femoral Vein: No evidence of thrombus. Normal compressibility, respiratory phasicity and response to augmentation. Popliteal Vein: No evidence of thrombus. Normal compressibility, respiratory phasicity and response to augmentation. Calf Veins: No evidence of thrombus. Normal compressibility and flow on color Doppler imaging. Superficial Great Saphenous Vein: No evidence of thrombus. Normal compressibility and flow on color Doppler imaging. Venous Reflux:  Not evaluated. Other Findings:  None.  IMPRESSION: No evidence of DVT involving in the left lower extremity. Electronically Signed   By: Evangeline Dakin M.D.   On: 04/14/2017 15:57   Scheduled Meds: . albuterol  2.5 mg Nebulization QID  . ALPRAZolam  1 mg Oral QHS  . aspirin EC  81 mg Oral Daily  . atorvastatin  20 mg Oral Daily  . clopidogrel  75 mg Oral Daily  . enoxaparin (LOVENOX) injection  40 mg Subcutaneous Q24H  . ferrous sulfate  325 mg Oral Q breakfast  . folic acid  1 mg Oral  Daily  . isosorbide mononitrate  60 mg Oral Daily  . levothyroxine  75 mcg Oral QAC breakfast  . magnesium oxide  200 mg Oral Daily  . metoprolol tartrate  25 mg Oral BID  . pantoprazole  40 mg Oral Daily  . pramipexole  2 mg Oral Daily  . sertraline  200 mg Oral QHS  . traZODone  200 mg Oral QHS   Continuous Infusions: . cefTRIAXone (ROCEPHIN)  IV Stopped (04/15/17 0954)    LOS: 0 days   Kerney Elbe, DO [facility] Pager [phone]  If 7PM-7AM, please contact night-coverage [email or web address] Password [email or web address] 04/15/2017, 6:00 PM

## 2017-04-15 NOTE — Progress Notes (Signed)
  Echocardiogram 2D Echocardiogram has been performed.  [name] 04/15/2017, 3:06 PM

## 2017-04-15 NOTE — Progress Notes (Signed)
Pt selected [facility] and asked for [facility] for HHPT. Referral given to in house rep.

## 2017-04-15 NOTE — Progress Notes (Signed)
SATURATION QUALIFICATIONS: (This note is used to comply with regulatory documentation for home oxygen)  Patient Saturations on Room Air at Rest = 95%  Patient Saturations on Room Air while Ambulating = 93%  Patient Saturations on Liters of oxygen while Ambulating = Patient is not on oxygen, not needed.  Please briefly explain why patient needs home oxygen: Patient does not qualify for oxygen

## 2017-04-15 NOTE — Care Management Obs Status (Signed)
[facility] NOTIFICATION   Patient Details  Name: [name] MRN: [id number] Date of Birth: 03-Feb-1946   Medicare Observation Status Notification Given:  Yes    Purcell Mouton, RN 04/15/2017, 3:28 PM

## 2017-04-15 NOTE — Progress Notes (Signed)
PHARMACY NOTE -  Ceftriaxone  Pharmacy has been consulted to dose Ceftriaxone for UTI.  Will order Ceftriaxone 1g IV q24h.  No renal adjustment needed; therefore, pharmacy will sign off since need for further dosage adjustment appears unlikely at present.    Please reconsult if a change in clinical status warrants re-evaluation of dosage.  Thank you for the consult.  Hershal Coria, PharmD, BCPS Pager: [phone] 04/15/2017 7:38 AM

## 2017-04-16 ENCOUNTER — Ambulatory Visit (HOSPITAL_BASED_OUTPATIENT_CLINIC_OR_DEPARTMENT_OTHER)
Admission: RE | Admit: 2017-04-16 | Discharge: 2017-04-16 | Disposition: A | Discharge status: Home / Self Care | Payer: Medicare HMO | Source: Ambulatory Visit | Attending: Cardiology | Admitting: Cardiology

## 2017-04-16 DIAGNOSIS — I495 Sick sinus syndrome: Secondary | ICD-10-CM | POA: Diagnosis not present

## 2017-04-16 DIAGNOSIS — I25728 Atherosclerosis of autologous artery coronary artery bypass graft(s) with other forms of angina pectoris: Secondary | ICD-10-CM

## 2017-04-16 DIAGNOSIS — G2581 Restless legs syndrome: Secondary | ICD-10-CM | POA: Diagnosis not present

## 2017-04-16 DIAGNOSIS — N183 Chronic kidney disease, stage 3 (moderate): Secondary | ICD-10-CM | POA: Diagnosis not present

## 2017-04-16 DIAGNOSIS — R0602 Shortness of breath: Secondary | ICD-10-CM

## 2017-04-16 DIAGNOSIS — E785 Hyperlipidemia, unspecified: Secondary | ICD-10-CM | POA: Diagnosis not present

## 2017-04-16 DIAGNOSIS — F329 Major depressive disorder, single episode, unspecified: Secondary | ICD-10-CM | POA: Diagnosis not present

## 2017-04-16 DIAGNOSIS — K219 Gastro-esophageal reflux disease without esophagitis: Secondary | ICD-10-CM | POA: Diagnosis not present

## 2017-04-16 DIAGNOSIS — G4700 Insomnia, unspecified: Secondary | ICD-10-CM | POA: Diagnosis not present

## 2017-04-16 DIAGNOSIS — R0609 Other forms of dyspnea: Secondary | ICD-10-CM | POA: Diagnosis not present

## 2017-04-16 DIAGNOSIS — I10 Essential (primary) hypertension: Secondary | ICD-10-CM | POA: Diagnosis not present

## 2017-04-16 DIAGNOSIS — N390 Urinary tract infection, site not specified: Secondary | ICD-10-CM | POA: Diagnosis not present

## 2017-04-16 DIAGNOSIS — F419 Anxiety disorder, unspecified: Secondary | ICD-10-CM | POA: Diagnosis not present

## 2017-04-16 LAB — CBC WITH DIFFERENTIAL/PLATELET
BASOS ABS: 0 10*3/uL (ref 0.0–0.1)
BASOS PCT: 0 %
Eosinophils Absolute: 0.1 10*3/uL (ref 0.0–0.7)
Eosinophils Relative: 2 %
HEMATOCRIT: 40.9 % (ref 36.0–46.0)
HEMOGLOBIN: 13.8 g/dL (ref 12.0–15.0)
LYMPHS PCT: 22 %
Lymphs Abs: 1.8 10*3/uL (ref 0.7–4.0)
MCH: 29.9 pg (ref 26.0–34.0)
MCHC: 33.7 g/dL (ref 30.0–36.0)
MCV: 88.7 fL (ref 78.0–100.0)
MONO ABS: 1.2 10*3/uL — AB (ref 0.1–1.0)
MONOS PCT: 15 %
NEUTROS ABS: 4.9 10*3/uL (ref 1.7–7.7)
NEUTROS PCT: 61 %
Platelets: 147 10*3/uL — ABNORMAL LOW (ref 150–400)
RBC: 4.61 MIL/uL (ref 3.87–5.11)
RDW: 14 % (ref 11.5–15.5)
WBC: 8.1 10*3/uL (ref 4.0–10.5)

## 2017-04-16 LAB — NM MYOCAR MULTI W/SPECT W/WALL MOTION / EF
CHL CUP MPHR: 149 {beats}/min
CHL CUP RESTING HR STRESS: 71 {beats}/min
CSEPEW: 1 METS
Exercise duration (min): 5 min
Peak HR: 74 {beats}/min
Percent HR: 49 %

## 2017-04-16 LAB — COMPREHENSIVE METABOLIC PANEL
ALBUMIN: 3.8 g/dL (ref 3.5–5.0)
ALK PHOS: 103 U/L (ref 38–126)
ALT: 31 U/L (ref 14–54)
AST: 26 U/L (ref 15–41)
Anion gap: 11 (ref 5–15)
BILIRUBIN TOTAL: 0.7 mg/dL (ref 0.3–1.2)
BUN: 25 mg/dL — AB (ref 6–20)
CALCIUM: 9 mg/dL (ref 8.9–10.3)
CO2: 30 mmol/L (ref 22–32)
CREATININE: 1.55 mg/dL — AB (ref 0.44–1.00)
Chloride: 98 mmol/L — ABNORMAL LOW (ref 101–111)
GFR calc Af Amer: 38 mL/min — ABNORMAL LOW (ref >60)
GFR, EST NON AFRICAN AMERICAN: 33 mL/min — AB (ref >60)
GLUCOSE: 113 mg/dL — AB (ref 65–99)
Potassium: 3.8 mmol/L (ref 3.5–5.1)
Sodium: 139 mmol/L (ref 135–145)
TOTAL PROTEIN: 7.1 g/dL (ref 6.5–8.1)

## 2017-04-16 LAB — LIPID PANEL
CHOL/HDL RATIO: 2.8 ratio
Cholesterol: 168 mg/dL (ref 0–200)
HDL: 61 mg/dL (ref >40)
LDL CALC: 87 mg/dL (ref 0–99)
Triglycerides: 98 mg/dL (ref <150)
VLDL: 20 mg/dL (ref 0–40)

## 2017-04-16 LAB — PHOSPHORUS: Phosphorus: 5.2 mg/dL — ABNORMAL HIGH (ref 2.5–4.6)

## 2017-04-16 LAB — T4, FREE: FREE T4: 0.96 ng/dL (ref 0.61–1.12)

## 2017-04-16 LAB — MAGNESIUM: MAGNESIUM: 2.4 mg/dL (ref 1.7–2.4)

## 2017-04-16 LAB — TSH: TSH: 1.267 u[IU]/mL (ref 0.350–4.500)

## 2017-04-16 MED ORDER — TECHNETIUM TC 99M TETROFOSMIN IV KIT
30.0000 | PACK | Freq: Once | INTRAVENOUS | Status: AC | PRN
  Administered 2017-04-16: 30 via INTRAVENOUS

## 2017-04-16 MED ORDER — TECHNETIUM TC 99M TETROFOSMIN IV KIT
10.0000 | PACK | Freq: Once | INTRAVENOUS | Status: AC | PRN
  Administered 2017-04-16: 10 via INTRAVENOUS

## 2017-04-16 MED ORDER — REGADENOSON 0.4 MG/5ML IV SOLN
0.4000 mg | Freq: Once | INTRAVENOUS | Status: AC
  Administered 2017-04-16: 0.4 mg via INTRAVENOUS

## 2017-04-16 MED ORDER — REGADENOSON 0.4 MG/5ML IV SOLN
INTRAVENOUS | Status: AC
  Filled 2017-04-16: qty 5

## 2017-04-16 MED ORDER — CEPHALEXIN 500 MG PO CAPS: 500.0000 mg | ORAL_CAPSULE | Freq: Two times a day (BID) | ORAL | 0 refills | Status: AC
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
## 2017-04-16 NOTE — Care Management Note (Signed)
Case Management Note  Patient Details  Name: [name] MRN: [id number] Date of Birth: 04-Dec-1945  Subjective/Objective:      Dyspnea, hypoxia              Action/Plan: Discharge Planning: Chart reviewed. Please see previous NCM notes. Contacted [facility] to make aware pt dc today. No oxygen needed for home.   PCP Marry Guan MD  Expected Discharge Date:  04/16/17               Expected Discharge Plan:  [name]  In-House Referral:  NA  Discharge planning Services  CM Consult  Post Acute Care Choice:  Home Health Choice offered to:  Patient  DME Arranged:  N/A DME Agency:  NA  HH Arranged:  PT, RN [place] Agency:  [facility]  Status of Service:  Completed, signed off  If discussed at [place] of Stay Meetings, dates discussed:    Additional Comments:  Erenest Rasher, RN 04/16/2017, 3:17 PM

## 2017-04-16 NOTE — Progress Notes (Signed)
OT Cancellation Note  Patient Details Name: [name] MRN: [id number] DOB: 12-23-1945   Cancelled Treatment:    Reason Eval/Treat Not Completed: Patient at procedure or test/ unavailable. Pt is currently in nuclear med for test.  Almon Register [phone] 04/16/2017, 11:04 AM

## 2017-04-16 NOTE — Discharge Summary (Signed)
Physician Discharge Summary  [name] ZOX:[id number] DOB: September 10, 1946 DOA: 04/14/2017  PCP: Marry Guan  Admit date: 04/14/2017 Discharge date: 04/16/2017  Admitted From: Home Disposition: [place] [facility]  Recommendations for Outpatient Follow-up:  1. Follow up with PCP in 1-2 weeks 2. Follow up with Cardiology Dr. Marlou Porch as an outpatient 3. Follow up with Pulmonology as an outpatient to evaluate for Dyspnea 4. Please obtain CMP/CBC, Mag, Phos in one week 5. Repeat CXR in 3-6 Weeks  6. Follow up on the Urine Cx Sensitivities for E Coli UTI  Home Health: Yes Equipment/Devices: None recommended by PT  Discharge Condition: Stable CODE STATUS: FULL CODE Diet recommendation: Heart Healthy   Brief/Interim Summary: [name] a 71 y.o.femalewith history of CAD status post CABG, symptomatic bradycardia status post pacemaker placement, hypertension, COPD, stroke and hypothyroidism and other comorbids who presented to the ER at [facility] with complaints of shortness of breath. Patient states that over the last 1 month patient has been having increasing exertional shortness of breath limiting her movements. Patient usually walks around with walker. She had a fall last month. Patient denies any chest pain and productive cough fever or chills but has had on and off Chest pressure.In the ER chest x-ray was unremarkable. BNP is minimally elevated. Since patient also complained of left lower extremity swelling patient had Dopplers which was negative for DVT. Given the symptoms patient was given Lasix 20 mg IV in the ER and admitted for further management of exertional dyspnea likely from CHF versus to rule out ACS. Since patient had fall, CT of the head and C-spine were done which were unremarkable. Cardiology was consulted for evaluation and further recommendations. ECHOCardiogram was obtained. Cardiology recommended NM Stress test which was done today and was low risk.  Patient did not qualify for Home O2 as her Saturations while ambulating maintained above 92%. PT evaluated the patient and recommended Home Health. Patient was improved but still felt slightly dyspneic but O2 Saturations ranged from 94-98%. She was also found to have an E Coli UTI with Sensitivities pending. She was deemed medically stable for D/C and will need to follow up with PCP for results of sensitivities of Urine Cx and for any antibiotic adjustments, Cardiology for further evaluation of Leg Swelling and restarting Lasix, and Pulmonology for the evaluation of her Dyspnea.   Discharge Diagnoses:  Principal Problem:   Exertional dyspnea Active Problems:   Hyperlipidemia   Anxiety   Depression   Restless legs syndrome (RLS)   Essential hypertension   MYOCARDIAL INFARCTION, HX OF   GERD   INSOMNIA   Dyspnea on exertion   Sinus node dysfunction (HCC)   Pacemaker -[employer]   CAD (coronary artery disease) S/P CABG   Acute lower UTI   Syncope and collapse   CKD (chronic kidney disease) stage 3, GFR 30-59 ml/min  Exertional Dyspnea/Chest Pressure/Chest Pain with lower Extremity Edema , improved -Lower Extremity Venous Doppler Negative for DVT in the Left Lower Extremity  -Possibly secondary to CHF but will rule out ACS.  -ECHOCardiogram done and showed EF of 60-65% with Normal Wall motion and no Regional Wall motion abnormalities. Did however show Grade 1 DD.  -Cardiac Troponin I was <0.03 x 4 -BNP was mildly elevated at 137.2 -Cardiology Consulted for further evaluation and recommendations -IV Lasix 20 mg daily discontinued by Cardiology given bump in SCr -D-Dimer was <0.27 and 0.27 -Cardiology does not strongly suspect ischemia however patient underwent NM Stress Testing  and was low riks  -C/w ASA 81 mg po Daily, Atorvastatin 20 mg po Daily, Clopidogrel 75 mg po Daily, Isosorbide Mononitrate 60 mg po Daily, Metoprolol 25 mg po BID -EKG was abnormal with pacing  Spikes -Follow up with Cardiology as an outpatient for further medication management and adjustment -Walk Screen done and patient did not qualify for Home O2 however she was Hypoxic on Ambulation on admission. -PT/OT Consult recommended Home Health -Patient still felt slightly dyspenic but saturations were ranged from 94-98%; Follow up with Pulmonary as an outpatient for optimal Pulmonary Medications  -Follow up with PCP as an outpatient   COPD  -No active wheezing on exam. -C/w Home Meds and Albuterol Inhaler  Hypertension  -Continue Home Medications of Metoprolol and Imdur -Lasix Discontinued due to AKI   History of Stroke with intracranial stent placement  C/w ASA 81 mg po Daily, Atorvastatin 20 mg po Daily, Clopidogrel 75 mg po Daily,  Hx of Symptomatic Bradycardia s/p Pacemaker -Continued with Telemetry  -Per Cards they are going to interrogate Pacemaker to assess for any Tachyarrhythmias; Interrogation revealed normal function with no reported tachy events  -Follow up with Cardiology as an outpatient   Hypothyroidism -Checked TSH and was 1.267 and Free T4 was 0.96 -C/w Levothyroxine 75 mcg po Daily  Restless Leg Syndrome -C/w Pramipexole 2 mg po Daily  CAD status post CABG x4 -C/w ASA 81 mg po Daily, Atorvastatin 20 mg po Daily, Clopidogrel 75 mg po Daily, Isosorbide Mononitrate 60 mg po Daily, Metoprolol 25 mg po BID and NTG 0.4 mg SL q5hmin PRN -No ACE/ARB because of CKD -Follow up with PCP and Cardiology as an outpatient   HLD -C/w Atorvastatin 20 mg po Daily  CKD Stage 3 -Given IV Lasix and BUN/Cr went from 17/1.27 -> 20/1.44 -> 25/1.55 -IV Lasix now D/C'd; Have PCP or Cardiology restart home Lasix -Avoid Nephrotoxic Medications -Repeat CMP as an outpatient   Recent Syncope and Fall -UA showed E Coli UTI -Head and Cervical CT showed No acute intracranial abnormalities. Stable left middle cerebral artery stent. No skull fracture. Cervical CT showed No  fracture or acute finding. Mature changes from previous fusion from C4 through C7 -ECHOCardiogram as below -Troponin <0.03 x 4 -Cardiology interrogated Pacemaker to assess for any tachyarrythmias and was normal -PT/OT Consultation recommend Home Health PT -Follow up with PCP as an outpatient; May need Compression stockings   E. Coli UTI -Urinalysis showed Few Bacteria, Small Hb, Large Leukocytes, Negative Nitrites, and 6-30 WBC -Urine Cx showed >100,000 CFU with Sensitivities pending  -Patient was started on Empiric Abx with IV Ceftriaxone and transitioned to po Keflex 500 mg po BID as an outpatient -PCP will need to follow up on Cx Sensitivities and adjust Abx accordingly if appropriate  Depression/Anxiety -C/w Sertraline 200 mg po qHS, Alprazolam 1 mg po qHS, Trazadone 200 mg po qHS  Esophageal Reflux -C/w Pantoprazole 40 mg po Daily  Insomnia  -C/w Diphenhydramine 50 mg po QHSprn and Trazdone 200 mg po qHS  Discharge Instructions  Discharge Instructions    Call MD for:  difficulty breathing, headache or visual disturbances    Complete by:  As directed    Call MD for:  extreme fatigue    Complete by:  As directed    Call MD for:  hives    Complete by:  As directed    Call MD for:  persistant dizziness or light-headedness    Complete by:  As directed    Call  MD for:  persistant nausea and vomiting    Complete by:  As directed    Call MD for:  redness, tenderness, or signs of infection (pain, swelling, redness, odor or green/yellow discharge around incision site)    Complete by:  As directed    Call MD for:  severe uncontrolled pain    Complete by:  As directed    Call MD for:  temperature >100.4    Complete by:  As directed    Diet - low sodium heart healthy    Complete by:  As directed    Discharge instructions    Complete by:  As directed    Follow up with PCP, Cardiology and Pulmonary as an outpatient. Take all medications as prescribed. If symptoms change or  worsen please return to the ED for Evaluation.   Increase activity slowly    Complete by:  As directed      Allergies as of 04/16/2017      Reactions   Codeine    swelling   Iohexol     Desc: PT HAD CONTRAST REACTION YRS AGO NEEDS 13 HR PREP FOR ALL CONTRAST STUDIES      Medication List    STOP taking these medications   furosemide 20 MG tablet Commonly known as:  LASIX     TAKE these medications   albuterol 108 (90 Base) MCG/ACT inhaler Commonly known as:  PROVENTIL HFA;VENTOLIN HFA Inhale 1-2 puffs into the lungs every 6 (six) hours as needed for wheezing or shortness of breath.   ALPRAZolam 1 MG tablet Commonly known as:  XANAX Take 1 tablet by mouth at bedtime.   aspirin 81 MG tablet Take 81 mg by mouth daily.   atorvastatin 20 MG tablet Commonly known as:  LIPITOR Take 20 mg by mouth daily.   cephALEXin 500 MG capsule Commonly known as:  KEFLEX Take 1 capsule (500 mg total) by mouth 2 (two) times daily.   clopidogrel 75 MG tablet Commonly known as:  PLAVIX Take 75 mg by mouth daily.   diphenhydrAMINE 25 MG tablet Commonly known as:  BENADRYL Take 50 mg by mouth at bedtime as needed for sleep.   folic acid 379 MCG tablet Commonly known as:  FOLVITE Take 800 mcg by mouth daily.   Iron 325 (65 Fe) MG Tabs Take 1 tablet by mouth daily.   isosorbide mononitrate 60 MG 24 hr tablet Commonly known as:  IMDUR Take 60 mg by mouth daily.   levothyroxine 75 MCG tablet Commonly known as:  SYNTHROID, LEVOTHROID Take 75 mcg by mouth daily before breakfast.   Magnesium 250 MG Tabs Take 1 tablet by mouth daily.   Melatonin 10 MG Tabs Take 20 mg by mouth at bedtime.   metoprolol tartrate 25 MG tablet Commonly known as:  LOPRESSOR Take 25 mg by mouth 2 (two) times daily.   nitroGLYCERIN 0.4 MG SL tablet Commonly known as:  NITROSTAT Place 0.4 mg under the tongue every 5 (five) minutes as needed for chest pain.   pantoprazole 40 MG tablet Commonly known  as:  PROTONIX Take 40 mg by mouth daily.   pramipexole 1 MG tablet Commonly known as:  MIRAPEX Take 2 mg by mouth daily.   sertraline 100 MG tablet Commonly known as:  ZOLOFT Take 200 mg by mouth at bedtime.   traZODone 100 MG tablet Commonly known as:  DESYREL Take 200 mg by mouth at bedtime.      Follow-up Information    Alroy Dust,  John. Call in 1 week(s).   Specialty:  Family Medicine Why:  Call to schedule an appointment within 1 week Contact information: [place] [address] [phone]        Jerline Pain, MD. Call.   Specialty:  Cardiology Why:  Call to scheule an appointment within 1 week  Contact information: [address] [place] [address] [phone]        Tanda Rockers, MD. Call.   Specialty:  Pulmonary Disease Why:  Call to schedule an appointment for Dyspnea an SOB Contact information: [address] [place] [address] [phone]          Allergies  Allergen Reactions  . Codeine     swelling  . Iohexol      Desc: PT HAD CONTRAST REACTION YRS AGO NEEDS 13 HR PREP FOR ALL CONTRAST STUDIES    Consultations:  Cardiology Dr. Jeneen Rinks Hochrein/ Dr. Fransico Him  Physical Therapy  Procedures/Studies: Dg Chest 2 View  Result Date: 04/14/2017 CLINICAL DATA:  Shob, leg swelling, fall and hit head last night. EXAM: CHEST  2 VIEW COMPARISON:  Chest x-ray dated 11/21/2008. FINDINGS: Study is slightly hypoinspiratory with crowding of the perihilar and bibasilar bronchovascular markings. Given the low lung volumes, lungs appear clear. No pleural effusion or pneumothorax seen. Left chest wall dual lead pacemaker in place without evidence of wire discontinuity. Median sternotomy wires appear intact and stable alignment. No acute or suspicious osseous finding. IMPRESSION: No active cardiopulmonary disease. No evidence of pneumonia or pulmonary edema. Electronically Signed   By: Franki Cabot M.D.   On:  04/14/2017 15:18   Ct Head Wo Contrast  Result Date: 04/14/2017 CLINICAL DATA:  Pt keeps falling. Having problems w/balance. fell last night and "bumped" her head. [place] one mo ago. Hx c/s fusion. EXAM: CT HEAD WITHOUT CONTRAST CT CERVICAL SPINE WITHOUT CONTRAST TECHNIQUE: Multidetector CT imaging of the head and cervical spine was performed following the standard protocol without intravenous contrast. Multiplanar CT image reconstructions of the cervical spine were also generated. COMPARISON:  Head CT, 08/26/2008 FINDINGS: CT HEAD FINDINGS Brain: No evidence of acute infarction, hemorrhage, hydrocephalus, extra-axial collection or mass lesion/mass effect. The ventricles and sulci are mildly enlarged reflecting mild generalized atrophy. Vascular: There is a stable left middle cerebral artery stent. Skull: Normal. Negative for fracture or focal lesion. Sinuses/Orbits: Dependent mucosal thickening or secretions in the right maxillary sinus. Visualized sinuses otherwise clear. Mastoid air cells are clear. Other: None. CT CERVICAL SPINE FINDINGS Alignment: No spondylolisthesis. Slight reversal the normal cervical lordosis, apex at C5. Skull base and vertebrae: No acute fracture. No osteoblastic or osteolytic lesions. Soft tissues and spinal canal: No prevertebral fluid or swelling. No visible canal hematoma. Disc levels: Previous spinal fusion. Bony fusion is seen at C4-C5 and C6. There is also bony fusion across the C6-C7 disc interspace. An anterior fusion plate with screws extends across the C6-C7. The orthopedic hardware is well-seated with no evidence of loosening. Bone graft material is mature. Mild loss of disc height with endplate spurring at D3-U2. Mild disc bulging. No convincing disc herniation. No significant stenosis. Mild to moderate loss of disc height at C7-T1. No disc herniation or significant stenosis. Upper chest: Unremarkable. Other: None. IMPRESSION: HEAD CT: No acute intracranial abnormalities.  Stable left middle cerebral artery stent. No skull fracture. CERVICAL CT: No fracture or acute finding. Mature changes from previous fusion from C4 through C7. Electronically Signed   By:  Lajean Manes M.D.   On: 04/14/2017 15:43   Ct Cervical Spine Wo Contrast  Result Date: 04/14/2017 CLINICAL DATA:  Pt keeps falling. Having problems w/balance. fell last night and "bumped" her head. [address] one mo ago. Hx c/s fusion. EXAM: CT HEAD WITHOUT CONTRAST CT CERVICAL SPINE WITHOUT CONTRAST TECHNIQUE: Multidetector CT imaging of the head and cervical spine was performed following the standard protocol without intravenous contrast. Multiplanar CT image reconstructions of the cervical spine were also generated. COMPARISON:  Head CT, 08/26/2008 FINDINGS: CT HEAD FINDINGS Brain: No evidence of acute infarction, hemorrhage, hydrocephalus, extra-axial collection or mass lesion/mass effect. The ventricles and sulci are mildly enlarged reflecting mild generalized atrophy. Vascular: There is a stable left middle cerebral artery stent. Skull: Normal. Negative for fracture or focal lesion. Sinuses/Orbits: Dependent mucosal thickening or secretions in the right maxillary sinus. Visualized sinuses otherwise clear. Mastoid air cells are clear. Other: None. CT CERVICAL SPINE FINDINGS Alignment: No spondylolisthesis. Slight reversal the normal cervical lordosis, apex at C5. Skull base and vertebrae: No acute fracture. No osteoblastic or osteolytic lesions. Soft tissues and spinal canal: No prevertebral fluid or swelling. No visible canal hematoma. Disc levels: Previous spinal fusion. Bony fusion is seen at C4-C5 and C6. There is also bony fusion across the C6-C7 disc interspace. An anterior fusion plate with screws extends across the C6-C7. The orthopedic hardware is well-seated with no evidence of loosening. Bone graft material is mature. Mild loss of disc height with endplate spurring at Z6-X0. Mild disc bulging. No convincing disc  herniation. No significant stenosis. Mild to moderate loss of disc height at C7-T1. No disc herniation or significant stenosis. Upper chest: Unremarkable. Other: None. IMPRESSION: HEAD CT: No acute intracranial abnormalities. Stable left middle cerebral artery stent. No skull fracture. CERVICAL CT: No fracture or acute finding. Mature changes from previous fusion from C4 through C7. Electronically Signed   By: Lajean Manes M.D.   On: 04/14/2017 15:43   Nm Myocar Multi W/spect W/wall Motion / Ef  Result Date: 04/16/2017  There was no ST segment deviation noted during stress.  No T wave inversion was noted during stress.  Defect 1: There is a medium defect of moderate severity present in the basal anteroseptal, basal inferoseptal, mid anteroseptal, mid inferoseptal and apical septal location.  Findings consistent with prior anteroseptal myocardial infarction.  This is a low risk study.  Nuclear stress EF: 61%.  The left ventricular ejection fraction is normal (55-65%).    US Venous Img Lower Unilateral Left  Result Date: 04/14/2017 CLINICAL DATA:  Chronic left lower extremity pain for several months. Prior MI and stroke. EXAM: LEFT LOWER EXTREMITY VENOUS DOPPLER ULTRASOUND TECHNIQUE: Gray-scale sonography with graded compression, as well as color Doppler and duplex ultrasound were performed to evaluate the lower extremity deep venous systems from the level of the common femoral vein and including the common femoral, femoral, profunda femoral, popliteal and calf veins including the posterior tibial, peroneal and gastrocnemius veins when visible. The superficial great saphenous vein was also interrogated. Spectral Doppler was utilized to evaluate flow at rest and with distal augmentation maneuvers in the common femoral, femoral and popliteal veins. COMPARISON:  05/15/2008. FINDINGS: Contralateral Right Common Femoral Vein: Respiratory phasicity is normal and symmetric with the symptomatic side. No  evidence of thrombus. Normal compressibility. Left Common Femoral Vein: No evidence of thrombus. Normal compressibility, respiratory phasicity and response to augmentation. Saphenofemoral Junction: No evidence of thrombus. Normal compressibility and flow on color Doppler imaging. Profunda Femoral Vein:  No evidence of thrombus. Normal compressibility and flow on color Doppler imaging. Femoral Vein: No evidence of thrombus. Normal compressibility, respiratory phasicity and response to augmentation. Popliteal Vein: No evidence of thrombus. Normal compressibility, respiratory phasicity and response to augmentation. Calf Veins: No evidence of thrombus. Normal compressibility and flow on color Doppler imaging. Superficial Great Saphenous Vein: No evidence of thrombus. Normal compressibility and flow on color Doppler imaging. Venous Reflux:  Not evaluated. Other Findings:  None. IMPRESSION: No evidence of DVT involving in the left lower extremity. Electronically Signed   By: Evangeline Dakin M.D.   On: 04/14/2017 15:57    ECHOCARDIOGRAM Study Conclusions  - Left ventricle: The cavity size was normal. There was mild concentric hypertrophy. Systolic function was normal. The estimated ejection fraction was in the range of 60% to 65%. Wall motion was normal; there were no regional wall motion abnormalities. Doppler parameters are consistent with abnormal left ventricular relaxation (grade 1 diastolic dysfunction). There was no evidence of elevated ventricular filling pressure by Doppler parameters. - Aortic valve: There was no regurgitation. - Mitral valve: There was no regurgitation. - Left atrium: The atrium was mildly dilated. - Right ventricle: The cavity size was moderately dilated. Wall thickness was normal. Systolic function was moderately reduced. - Tricuspid valve: There was severe regurgitation. - Pulmonary arteries: Systolic pressure was mildly increased. PA peak pressure: 33  mm Hg (S). - Inferior vena cava: The vessel was dilated. The respirophasic diameter changes were blunted (<50%), consistent with elevated central venous pressure.  NUCLEAR MEDICINE STRESS TEST  There was no ST segment deviation noted during stress.  No T wave inversion was noted during stress.  Defect 1: There is a medium defect of moderate severity present in the basal anteroseptal, basal inferoseptal, mid anteroseptal, mid inferoseptal and apical septal location.  Findings consistent with prior anteroseptal myocardial infarction.  This is a low risk study.  Nuclear stress EF: 61%. The left ventricular ejection fraction is normal (55-65%).  Subjective: Seen and examined and was resting bedside. No nausea or Vomiting. No CP. Still felt slightly SOB but was saturating 94-98% and O2 Saturation remained above 92%. Denied any lightheadedness or dizziness. No other complaints or concerns and wanting to go home.   Discharge Exam: Vitals:   04/16/17 0513 04/16/17 1430  BP: 126/60 137/74  Pulse: 71 74  Resp: 20 18  Temp: 97.9 F (36.6 C) 97.7 F (36.5 C)   Vitals:   04/15/17 1940 04/15/17 2059 04/16/17 0513 04/16/17 1430  BP:  110/64 126/60 137/74  Pulse:  70 71 74  Resp:  20 20 18   Temp:  97.8 F (36.6 C) 97.9 F (36.6 C) 97.7 F (36.5 C)  TempSrc:  Oral Oral Oral  SpO2: 99% 95% 94% 98%  Weight:   91 kg (200 lb 11.2 oz)   Height:       General: Pt is alert, awake, not in acute distress Cardiovascular: RRR, S1/S2 +, no rubs, no gallops Respiratory: CTA bilaterally, no wheezing, no rhonchi; Patient was not tachypenic or using any accessory muscles to breathe Abdominal: Soft, NT, ND, bowel sounds + Extremities: no appreciable edema, no cyanosis  The results of significant diagnostics from this hospitalization (including imaging, microbiology, ancillary and laboratory) are listed below for reference.    Microbiology: Recent Results (from the past 240 hour(s))   Culture, Urine     Status: Abnormal (Preliminary result)   Collection Time: 04/15/17  9:45 AM  Result Value Ref Range Status   Specimen  Description URINE, CLEAN CATCH  Final   Special Requests NONE  Final   Culture >=100,000 COLONIES/mL ESCHERICHIA COLI (A)  Final   Report Status PENDING  Incomplete    Labs: BNP (last 3 results)  Recent Labs  04/14/17 1421  BNP 675.9*   Basic Metabolic Panel:  Recent Labs Lab 04/14/17 1421 04/14/17 2212 04/15/17 0326 04/15/17 0755 04/16/17 0409  NA 136  --  138  --  139  K 4.5  --  4.2  --  3.8  CL 97*  --  99*  --  98*  CO2 29  --  30  --  30  GLUCOSE 84  --  103*  --  113*  BUN 17  --  20  --  25*  CREATININE 1.27* 1.38* 1.44*  --  1.55*  CALCIUM 8.9  --  9.0  --  9.0  MG  --   --   --  2.3 2.4  PHOS  --   --   --   --  5.2*   Liver Function Tests:  Recent Labs Lab 04/14/17 1421 04/16/17 0409  AST 35 26  ALT 37 31  ALKPHOS 114 103  BILITOT 0.6 0.7  PROT 7.6 7.1  ALBUMIN 4.1 3.8   No results for input(s): LIPASE, AMYLASE in the last 168 hours. No results for input(s): AMMONIA in the last 168 hours. CBC:  Recent Labs Lab 04/14/17 1421 04/14/17 2212 04/15/17 0326 04/16/17 0409  WBC 5.8 7.2 7.1 8.1  NEUTROABS 3.4  --   --  4.9  HGB 13.4 14.0 13.4 13.8  HCT 40.1 41.0 40.0 40.9  MCV 90.3 86.9 89.1 88.7  PLT 157 151 148* 147*   Cardiac Enzymes:  Recent Labs Lab 04/14/17 1421 04/14/17 2212 04/15/17 0326 04/15/17 0805  TROPONINI <0.03 <0.03 <0.03 <0.03   BNP: Invalid input(s): POCBNP CBG: No results for input(s): GLUCAP in the last 168 hours. D-Dimer  Recent Labs  04/14/17 1421 04/14/17 2212  DDIMER <0.27 0.27   Hgb A1c No results for input(s): HGBA1C in the last 72 hours. Lipid Profile  Recent Labs  04/16/17 0409  CHOL 168  HDL 61  LDLCALC 87  TRIG 98  CHOLHDL 2.8   Thyroid function studies  Recent Labs  04/16/17 0409  TSH 1.267   Anemia work up No results for input(s):  VITAMINB12, FOLATE, FERRITIN, TIBC, IRON, RETICCTPCT in the last 72 hours. Urinalysis    Component Value Date/Time   COLORURINE YELLOW 04/14/2017 1559   APPEARANCEUR CLEAR 04/14/2017 1559   APPEARANCEUR Clear 01/24/2014 1550   LABSPEC 1.005 04/14/2017 1559   LABSPEC 1.016 01/24/2014 1550   PHURINE 7.5 04/14/2017 1559   GLUCOSEU NEGATIVE 04/14/2017 1559   GLUCOSEU Negative 01/24/2014 1550   HGBUR SMALL (A) 04/14/2017 1559   BILIRUBINUR NEGATIVE 04/14/2017 1559   BILIRUBINUR Negative 01/24/2014 1550   KETONESUR NEGATIVE 04/14/2017 1559   PROTEINUR NEGATIVE 04/14/2017 1559   NITRITE NEGATIVE 04/14/2017 1559   LEUKOCYTESUR LARGE (A) 04/14/2017 1559   LEUKOCYTESUR 2+ 01/24/2014 1550   Sepsis Labs Invalid input(s): PROCALCITONIN,  WBC,  LACTICIDVEN Microbiology Recent Results (from the past 240 hour(s))  Culture, Urine     Status: Abnormal (Preliminary result)   Collection Time: 04/15/17  9:45 AM  Result Value Ref Range Status   Specimen Description URINE, CLEAN CATCH  Final   Special Requests NONE  Final   Culture >=100,000 COLONIES/mL ESCHERICHIA COLI (A)  Final   Report Status PENDING  Incomplete  Time coordinating discharge: 35 minutes  SIGNED:  Kerney Elbe, DO [facility] 04/16/2017, 2:47 PM Pager [phone]  If 7PM-7AM, please contact night-coverage [email or web address] Password [email or web address]

## 2017-04-16 NOTE — Progress Notes (Signed)
   [name] presented for a [employer] today.  No immediate complications.  Stress imaging is pending at this time.  Reino Bellis, NP 04/16/2017, 11:29 AM

## 2017-04-16 NOTE — Progress Notes (Addendum)
Progress Note  Patient Name: [name] Date of Encounter: 04/16/2017  Primary Cardiologist: Dr. Percival Spanish  Subjective   She denies any chest pain or SOB  Inpatient Medications    Scheduled Meds: . ALPRAZolam  1 mg Oral QHS  . aspirin EC  81 mg Oral Daily  . atorvastatin  20 mg Oral Daily  . clopidogrel  75 mg Oral Daily  . enoxaparin (LOVENOX) injection  40 mg Subcutaneous Q24H  . ferrous sulfate  325 mg Oral Q breakfast  . folic acid  1 mg Oral Daily  . isosorbide mononitrate  60 mg Oral Daily  . levothyroxine  75 mcg Oral QAC breakfast  . magnesium oxide  200 mg Oral Daily  . metoprolol tartrate  25 mg Oral BID  . pantoprazole  40 mg Oral Daily  . pramipexole  2 mg Oral Daily  . sertraline  200 mg Oral QHS  . traZODone  200 mg Oral QHS   Continuous Infusions: . cefTRIAXone (ROCEPHIN)  IV Stopped (04/15/17 0954)   PRN Meds: acetaminophen **OR** acetaminophen, albuterol, diphenhydrAMINE, nitroGLYCERIN, ondansetron **OR** ondansetron (ZOFRAN) IV   Vital Signs    Vitals:   04/15/17 1258 04/15/17 1940 04/15/17 2059 04/16/17 0513  BP: 109/62  110/64 126/60  Pulse: 71  70 71  Resp: 16  20 20   Temp: 97.6 F (36.4 C)  97.8 F (36.6 C) 97.9 F (36.6 C)  TempSrc: Oral  Oral Oral  SpO2: 100% 99% 95% 94%  Weight:    200 lb 11.2 oz (91 kg)  Height:        Intake/Output Summary (Last 24 hours) at 04/16/17 0746 Last data filed at 04/16/17 0600  Gross per 24 hour  Intake              770 ml  Output                0 ml  Net              770 ml   Filed Weights   04/14/17 1940 04/15/17 0522 04/16/17 0513  Weight: 212 lb 4.9 oz (96.3 kg) 207 lb 3.7 oz (94 kg) 200 lb 11.2 oz (91 kg)    Telemetry    NSR - Personally Reviewed  ECG    No new EKG to review - Personally Reviewed  Physical Exam   GEN: No acute distress.   Neck: No JVD Cardiac: RRR, no murmurs, rubs, or gallops.  Respiratory: scattered faint expiratory wheezes GI: Soft, nontender,  non-distended  MS: No edema; No deformity. Neuro:  Nonfocal  Psych: Normal affect   Labs    Chemistry Recent Labs Lab 04/14/17 1421 04/14/17 2212 04/15/17 0326 04/16/17 0409  NA 136  --  138 139  K 4.5  --  4.2 3.8  CL 97*  --  99* 98*  CO2 29  --  30 30  GLUCOSE 84  --  103* 113*  BUN 17  --  20 25*  CREATININE 1.27* 1.38* 1.44* 1.55*  CALCIUM 8.9  --  9.0 9.0  PROT 7.6  --   --  7.1  ALBUMIN 4.1  --   --  3.8  AST 35  --   --  26  ALT 37  --   --  31  ALKPHOS 114  --   --  103  BILITOT 0.6  --   --  0.7  GFRNONAA 41* 37* 36* 33*  GFRAA 48* 43* 41*  [place]  --  9 11     Hematology Recent Labs Lab 04/14/17 2212 04/15/17 0326 04/16/17 0409  WBC 7.2 7.1 8.1  RBC 4.72 4.49 4.61  HGB 14.0 13.4 13.8  HCT 41.0 40.0 40.9  MCV 86.9 89.1 88.7  MCH 29.7 29.8 29.9  MCHC 34.1 33.5 33.7  RDW 13.8 13.9 14.0  PLT 151 148* 147*    Cardiac Enzymes Recent Labs Lab 04/14/17 1421 04/14/17 2212 04/15/17 0326 04/15/17 0805  TROPONINI <0.03 <0.03 <0.03 <0.03   No results for input(s): TROPIPOC in the last 168 hours.   BNP Recent Labs Lab 04/14/17 1421  BNP 137.2*     DDimer  Recent Labs Lab 04/14/17 1421 04/14/17 2212  DDIMER <0.27 0.27     Radiology    Dg Chest 2 View  Result Date: 04/14/2017 CLINICAL DATA:  Shob, leg swelling, fall and hit head last night. EXAM: CHEST  2 VIEW COMPARISON:  Chest x-ray dated 11/21/2008. FINDINGS: Study is slightly hypoinspiratory with crowding of the perihilar and bibasilar bronchovascular markings. Given the low lung volumes, lungs appear clear. No pleural effusion or pneumothorax seen. Left chest wall dual lead pacemaker in place without evidence of wire discontinuity. Median sternotomy wires appear intact and stable alignment. No acute or suspicious osseous finding. IMPRESSION: No active cardiopulmonary disease. No evidence of pneumonia or pulmonary edema. Electronically Signed   By: Franki Cabot M.D.   On:  04/14/2017 15:18   Ct Head Wo Contrast  Result Date: 04/14/2017 CLINICAL DATA:  Pt keeps falling. Having problems w/balance. fell last night and "bumped" her head. [place] one mo ago. Hx c/s fusion. EXAM: CT HEAD WITHOUT CONTRAST CT CERVICAL SPINE WITHOUT CONTRAST TECHNIQUE: Multidetector CT imaging of the head and cervical spine was performed following the standard protocol without intravenous contrast. Multiplanar CT image reconstructions of the cervical spine were also generated. COMPARISON:  Head CT, 08/26/2008 FINDINGS: CT HEAD FINDINGS Brain: No evidence of acute infarction, hemorrhage, hydrocephalus, extra-axial collection or mass lesion/mass effect. The ventricles and sulci are mildly enlarged reflecting mild generalized atrophy. Vascular: There is a stable left middle cerebral artery stent. Skull: Normal. Negative for fracture or focal lesion. Sinuses/Orbits: Dependent mucosal thickening or secretions in the right maxillary sinus. Visualized sinuses otherwise clear. Mastoid air cells are clear. Other: None. CT CERVICAL SPINE FINDINGS Alignment: No spondylolisthesis. Slight reversal the normal cervical lordosis, apex at C5. Skull base and vertebrae: No acute fracture. No osteoblastic or osteolytic lesions. Soft tissues and spinal canal: No prevertebral fluid or swelling. No visible canal hematoma. Disc levels: Previous spinal fusion. Bony fusion is seen at C4-C5 and C6. There is also bony fusion across the C6-C7 disc interspace. An anterior fusion plate with screws extends across the C6-C7. The orthopedic hardware is well-seated with no evidence of loosening. Bone graft material is mature. Mild loss of disc height with endplate spurring at U2-P5. Mild disc bulging. No convincing disc herniation. No significant stenosis. Mild to moderate loss of disc height at C7-T1. No disc herniation or significant stenosis. Upper chest: Unremarkable. Other: None. IMPRESSION: HEAD CT: No acute intracranial abnormalities.  Stable left middle cerebral artery stent. No skull fracture. CERVICAL CT: No fracture or acute finding. Mature changes from previous fusion from C4 through C7. Electronically Signed   By: Lajean Manes M.D.   On: 04/14/2017 15:43   Ct Cervical Spine Wo Contrast  Result Date: 04/14/2017 CLINICAL DATA:  Pt keeps falling. Having problems w/balance. fell last night and "bumped"  her head. [place] one mo ago. Hx c/s fusion. EXAM: CT HEAD WITHOUT CONTRAST CT CERVICAL SPINE WITHOUT CONTRAST TECHNIQUE: Multidetector CT imaging of the head and cervical spine was performed following the standard protocol without intravenous contrast. Multiplanar CT image reconstructions of the cervical spine were also generated. COMPARISON:  Head CT, 08/26/2008 FINDINGS: CT HEAD FINDINGS Brain: No evidence of acute infarction, hemorrhage, hydrocephalus, extra-axial collection or mass lesion/mass effect. The ventricles and sulci are mildly enlarged reflecting mild generalized atrophy. Vascular: There is a stable left middle cerebral artery stent. Skull: Normal. Negative for fracture or focal lesion. Sinuses/Orbits: Dependent mucosal thickening or secretions in the right maxillary sinus. Visualized sinuses otherwise clear. Mastoid air cells are clear. Other: None. CT CERVICAL SPINE FINDINGS Alignment: No spondylolisthesis. Slight reversal the normal cervical lordosis, apex at C5. Skull base and vertebrae: No acute fracture. No osteoblastic or osteolytic lesions. Soft tissues and spinal canal: No prevertebral fluid or swelling. No visible canal hematoma. Disc levels: Previous spinal fusion. Bony fusion is seen at C4-C5 and C6. There is also bony fusion across the C6-C7 disc interspace. An anterior fusion plate with screws extends across the C6-C7. The orthopedic hardware is well-seated with no evidence of loosening. Bone graft material is mature. Mild loss of disc height with endplate spurring at I9-J1. Mild disc bulging. No convincing disc  herniation. No significant stenosis. Mild to moderate loss of disc height at C7-T1. No disc herniation or significant stenosis. Upper chest: Unremarkable. Other: None. IMPRESSION: HEAD CT: No acute intracranial abnormalities. Stable left middle cerebral artery stent. No skull fracture. CERVICAL CT: No fracture or acute finding. Mature changes from previous fusion from C4 through C7. Electronically Signed   By: Lajean Manes M.D.   On: 04/14/2017 15:43   US Venous Img Lower Unilateral Left  Result Date: 04/14/2017 CLINICAL DATA:  Chronic left lower extremity pain for several months. Prior MI and stroke. EXAM: LEFT LOWER EXTREMITY VENOUS DOPPLER ULTRASOUND TECHNIQUE: Gray-scale sonography with graded compression, as well as color Doppler and duplex ultrasound were performed to evaluate the lower extremity deep venous systems from the level of the common femoral vein and including the common femoral, femoral, profunda femoral, popliteal and calf veins including the posterior tibial, peroneal and gastrocnemius veins when visible. The superficial great saphenous vein was also interrogated. Spectral Doppler was utilized to evaluate flow at rest and with distal augmentation maneuvers in the common femoral, femoral and popliteal veins. COMPARISON:  05/15/2008. FINDINGS: Contralateral Right Common Femoral Vein: Respiratory phasicity is normal and symmetric with the symptomatic side. No evidence of thrombus. Normal compressibility. Left Common Femoral Vein: No evidence of thrombus. Normal compressibility, respiratory phasicity and response to augmentation. Saphenofemoral Junction: No evidence of thrombus. Normal compressibility and flow on color Doppler imaging. Profunda Femoral Vein: No evidence of thrombus. Normal compressibility and flow on color Doppler imaging. Femoral Vein: No evidence of thrombus. Normal compressibility, respiratory phasicity and response to augmentation. Popliteal Vein: No evidence of thrombus.  Normal compressibility, respiratory phasicity and response to augmentation. Calf Veins: No evidence of thrombus. Normal compressibility and flow on color Doppler imaging. Superficial Great Saphenous Vein: No evidence of thrombus. Normal compressibility and flow on color Doppler imaging. Venous Reflux:  Not evaluated. Other Findings:  None. IMPRESSION: No evidence of DVT involving in the left lower extremity. Electronically Signed   By: Evangeline Dakin M.D.   On: 04/14/2017 15:57    Cardiac Studies   2D echo 04/15/2017 Study Conclusions  - Left ventricle: The cavity size  was normal. There was mild   concentric hypertrophy. Systolic function was normal. The   estimated ejection fraction was in the range of 60% to 65%. Wall   motion was normal; there were no regional wall motion   abnormalities. Doppler parameters are consistent with abnormal   left ventricular relaxation (grade 1 diastolic dysfunction).   There was no evidence of elevated ventricular filling pressure by   Doppler parameters. - Aortic valve: There was no regurgitation. - Mitral valve: There was no regurgitation. - Left atrium: The atrium was mildly dilated. - Right ventricle: The cavity size was moderately dilated. Wall   thickness was normal. Systolic function was moderately reduced. - Tricuspid valve: There was severe regurgitation. - Pulmonary arteries: Systolic pressure was mildly increased. PA   peak pressure: 33 mm Hg (S). - Inferior vena cava: The vessel was dilated. The respirophasic   diameter changes were blunted (< 50%), consistent with elevated   central venous pressure.  Patient Profile     71 y.o. female with a history of CAD s/p CABG x 4 (2008), HTN, HL, remote tob abuse, COPD, CKD III, stroke, carotid arterial dzs, and syncope who was admitted with  dyspnea/hypoxia.   Assessment & Plan    1. Acute on chronic hypoxic respiratory failure:  BNP only mildly elevated. Troponins normal. EKG nonacute. Chest  x-ray with no acute cardiopulmonary disease. She was noted to be hypoxic with ambulation and was thus treated with IV Lasix.  She did not have any evidence of significant volume overload on exam. Her weights are relatively stable at home. She denies any history of orthopnea or PND but does note lower extremity swelling. Swelling occurs as she is on her feet throughout the day and is not present first thing in the morning suggesting more venous insufficiency than heart failure per se.  She is being treated with antibiotics. She also uses inhalers at home. She does also report a history of exertional chest discomfort and dyspnea.  -  2-D echocardiogram showed normal LVF with EF 60-65% with moderate RV dysfunction and severe TR possibly related to COPD - I&Os are incomplete - will ask nursing staff to measure q shift - weight is down 7lbs from yesterday - D-Dimer was normal  2. Unstable angina/coronary artery disease: Patient reports an 8-12 month history of exertional chest pressure associated with dyspnea. She is status post CABG 4 in 2008 and her last stress test was in 2014. 2D echo showed normal LVF so will proceed with noninvasive workup - She has not eaten this am so will make NPO now - Lexiscan myoview today to rule out ischemia  - Continue aspirin, statin, Plavix, long acting nitrates and lopressor 25mg  BID.  3. Essential hypertension: Blood pressure stable. - continue Lopressor 25mg  BID  4. Stage III chronic kidney disease: Creatinine bumped slightly after 1 dose of IV Lasix. I suspect she was not significantly volume overloaded to start out.  - creatinine slightly bumped from yesterday but will continue to follow.  She is not on PO Lasix.   5. Symptomatic bradycardia s/p PPM. She recently had syncope about a month ago -  we will interrogate her device to look for tachyarrhythmias.   6. Syncope: Patient had a sudden syncopal episode about one month ago without any prodrome. She did  not seek medical care at that time.  - We will interrogate her pacemaker to assess for any tachyarrhythmias.   7.  COPD:  Pt with long smoking hx, quit  in 2007.   - Per [facility]  Signed, Fransico Him, MD  04/16/2017, 7:46 AM

## 2017-04-17 LAB — URINE CULTURE

## 2017-04-19 ENCOUNTER — Ambulatory Visit (INDEPENDENT_AMBULATORY_CARE_PROVIDER_SITE_OTHER): Payer: Medicare HMO | Admitting: Pulmonary Disease

## 2017-04-19 ENCOUNTER — Encounter: Payer: Self-pay | Admitting: Pulmonary Disease

## 2017-04-19 ENCOUNTER — Other Ambulatory Visit (INDEPENDENT_AMBULATORY_CARE_PROVIDER_SITE_OTHER): Payer: Medicare HMO

## 2017-04-19 DIAGNOSIS — N183 Chronic kidney disease, stage 3 unspecified: Secondary | ICD-10-CM

## 2017-04-19 DIAGNOSIS — J9611 Chronic respiratory failure with hypoxia: Secondary | ICD-10-CM | POA: Diagnosis not present

## 2017-04-19 DIAGNOSIS — R0609 Other forms of dyspnea: Secondary | ICD-10-CM | POA: Diagnosis not present

## 2017-04-19 LAB — BASIC METABOLIC PANEL
BUN: 24 mg/dL — AB (ref 6–23)
CO2: 31 mEq/L (ref 19–32)
CREATININE: 1.44 mg/dL — AB (ref 0.40–1.20)
Calcium: 9.5 mg/dL (ref 8.4–10.5)
Chloride: 101 mEq/L (ref 96–112)
GFR: 38.08 mL/min — AB (ref >60.00)
Glucose, Bld: 102 mg/dL — ABNORMAL HIGH (ref 70–99)
Potassium: 4.4 mEq/L (ref 3.5–5.1)
Sodium: 138 mEq/L (ref 135–145)

## 2017-04-19 MED ORDER — ALBUTEROL SULFATE HFA 108 (90 BASE) MCG/ACT IN AERS: 1.0000 | INHALATION_SPRAY | Freq: Four times a day (QID) | RESPIRATORY_TRACT | 5 refills | Status: AC | PRN

## 2017-04-19 NOTE — Patient Instructions (Signed)
We will set you up with oxygen based on results obtained by her home care nurse  Prescription for Ventolin 2 puffs every 6 hours as needed will be sent  Blood work today, if renal function looks good-you can resume Lasix

## 2017-04-19 NOTE — Progress Notes (Signed)
Subjective:    Patient ID: [name], female    DOB: 12-20-1945, 71 y.o.   MRN: [id number]  HPI 71 year old  Heavy ex smoker referred for evaluation of COPD and hypoxia COPD was diagnosed in 2009 and she was on nocturnal oxygen for sometime 5 lost this when she moved from [place] to [place]. She is a retired Investment banker, operational at [employer]. I did She had a recent hospitalization admission for dyspnea and hypoxia. Underwent extensive workup including echo showing normal LV function, negative cardiac enzymes, negative d-dimer, negative lower extremity duplex. She had a low risk nuclear stress test. She was treated for Escherichia coli UTI. By discharge on 7/21 , her oxygen saturation was stable and she was discharged without oxygen. Her Lasix was stopped. Creatinine increased from 1.2-1.5. She does have CKD with a small nonfunctioning left kidney  Her home care nurse has sent a note stating that oxygen saturation was 98% at rest and dropped to 83% on walking and recovered in 2 minutes to 88%  She ran out of her Ventolin inhalers in the stopped using this    PMH - CAD status post CABG, symptomatic bradycardia status post pacemaker placement, hypertension, COPD, stroke and hypothyroidism  DO NOT RESUSCITATE status was noted    Significant tests/ events reviewed  Echo 04/15/17 >> nml LV fn, RVSP 33, decreased RV function, severe TR Spirometry 7/24 shows moderate restriction with ratio 72 and FEV1 of 63% and FVC of 66%   Past Medical History:  Diagnosis Date  . Anxiety   . Blood transfusion without reported diagnosis    10/29/2006  . Bulging disc   . Carotid arterial disease (HCC)    a. s/p L intracranial ICA stenting ([place]).  . Cataract 05/2012  . Chronic kidney disease, stage III (moderate)   . COPD (chronic obstructive pulmonary disease) ([place])    a. quit smoking in 2007.  Marland Kitchen Coronary artery disease    a. 2007 s/p MI and LAD stenting;  10/2006 CABG x 4: LIMA->LAD,  VG->OM->dLCX, VG->RPL;  c. 07/2013 MV: no ischemia.  . Depression   . Esophageal reflux   . History of echocardiogram    a. 01/2012 Echo: EF 55%, mildly dil LA/RV, mild MR, Ao Sclerosis, mod TR.  Marland Kitchen Hypothyroid   . Insomnia   . Macular degeneration   . Mixed hyperlipidemia   . Osteoporosis   . PVT (paroxysmal ventricular tachycardia) ([place])   . Restless leg syndrome   . Stroke ([place])   . Symptomatic bradycardia    a. s/p BSX Advantio dual chamber PPM Beckie Salts).  . Syncope and collapse   . Unspecified hearing loss      Past Surgical History:  Procedure Laterality Date  . CARDIAC CATHETERIZATION  2007   x2;[place]  . CARDIAC CATHETERIZATION  2008  . CATARACT EXTRACTION Right   . CEREBRAL ANGIOGRAM    . colonoscopy    . CORONARY ARTERY BYPASS GRAFT     x 4  . EYE SURGERY  05/2013   cataract removed  . GALLBLADDER SURGERY    . INSERT / REPLACE / REMOVE PACEMAKER  2013   [place], [place]; [employer]  . KNEE ARTHROSCOPY    . LUMBAR DISC SURGERY    . PARTIAL HYSTERECTOMY    . SPINE SURGERY  1999/2001/2006   3 disc replaced   Allergies  Allergen Reactions  . Codeine     swelling  . Iohexol      Desc: PT HAD CONTRAST  REACTION YRS AGO NEEDS 13 HR PREP FOR ALL CONTRAST STUDIES      Social History   Social History  . Marital status: Married    Spouse name: N/A  . Number of children: N/A  . Years of education: N/A   Occupational History  . retired     was ECG tech @ [employer]   Social History Main Topics  . Smoking status: Former Smoker    Packs/day: 0.75    Years: 40.00    Types: Cigarettes    Start date: 09/28/1965    Quit date: 05/27/2006  . Smokeless tobacco: Never Used  . Alcohol use No  . Drug use: No  . Sexual activity: Not Currently   Other Topics Concern  . Not on file   Social History Narrative   [place]ives in independent section of [place] retirement community.  Does not routinely exercise.    Family History  Problem Relation Age of Onset  . Stomach  cancer Mother   . Diabetes Mother   . Hyperlipidemia Mother   . Hypertension Father   . Lung cancer Father   . Alcohol abuse Father   . Early death Brother       Review of Systems Constitutional: negative for anorexia, fevers and sweats  Eyes: negative for irritation, redness and visual disturbance  Ears, nose, mouth, throat, and face: negative for earaches, epistaxis, nasal congestion and sore throat  Respiratory: negative for, sputum and wheezing  Cardiovascular: negative for chest pain, dyspnea,  orthopnea, palpitations and syncope  Gastrointestinal: negative for abdominal pain, constipation, diarrhea, melena, nausea and vomiting  Genitourinary:negative for dysuria, frequency and hematuria  Hematologic/lymphatic: negative for bleeding, easy bruising and lymphadenopathy  Musculoskeletal:negative for arthralgias, muscle weakness and stiff joints  Neurological: negative for coordination problems, gait problems, headaches and weakness  Endocrine: negative for diabetic symptoms including polydipsia, polyuria and weight loss     Objective:   Physical Exam  Gen. Pleasant, obese, in no distress ENT - no lesions, no post nasal drip, thyroidectomy scar Neck: No JVD, no thyromegaly, no carotid bruits Lungs: no use of accessory muscles, no dullness to percussion, decreased without rales or rhonchi  Cardiovascular: Rhythm regular, heart sounds  normal, no murmurs or gallops, no peripheral edema Musculoskeletal: No deformities, no cyanosis or clubbing , no tremors       Assessment & Plan:

## 2017-04-19 NOTE — Progress Notes (Signed)
   Subjective:    Patient ID: [name], female    DOB: 12/21/1945, 71 y.o.   MRN: [id number]  HPI    Review of Systems  Constitutional: Negative for fever and unexpected weight change.  HENT: Negative for congestion, dental problem, ear pain, nosebleeds, postnasal drip, rhinorrhea, sinus pressure, sneezing, sore throat and trouble swallowing.   Eyes: Negative for redness and itching.  Respiratory: Positive for chest tightness and shortness of breath. Negative for cough and wheezing.   Cardiovascular: Negative for palpitations and leg swelling.  Gastrointestinal: Negative for nausea and vomiting.  Genitourinary: Negative for dysuria.  Musculoskeletal: Negative for joint swelling.  Skin: Negative for rash.  Neurological: Negative for headaches.  Hematological: Does not bruise/bleed easily.  Psychiatric/Behavioral: Negative for dysphoric mood. [name] not nervous/anxious.        Objective:   Physical Exam        Assessment & Plan:

## 2017-04-19 NOTE — Assessment & Plan Note (Signed)
Blood work today, if renal function looks good-she can resume Lasix

## 2017-04-19 NOTE — Assessment & Plan Note (Addendum)
We will set you up with oxygen based on results obtained by her home care nurse  Prescription for Ventolin 2 puffs every 6 hours as needed will be sent She has moderate to severe restriction but no evidence of airway obstruction. Her dyspnea is multifactorial, ideally she should also get a workup for pulmonary hypertension given RV dysfunction and severe TR but given multiple comorbidities and DO NOT RESUSCITATE status best not to pursue this

## 2017-04-19 NOTE — Addendum Note (Signed)
Addended by: Lorane Gell on: 04/19/2017 12:38 PM   Modules accepted: Orders

## 2017-06-21 ENCOUNTER — Ambulatory Visit: Payer: Medicare HMO | Admitting: Adult Health

## 2017-06-30 ENCOUNTER — Other Ambulatory Visit: Payer: Self-pay | Admitting: Family Medicine

## 2017-07-18 ENCOUNTER — Encounter (HOSPITAL_BASED_OUTPATIENT_CLINIC_OR_DEPARTMENT_OTHER): Payer: Self-pay | Admitting: *Deleted

## 2017-07-18 ENCOUNTER — Emergency Department (HOSPITAL_BASED_OUTPATIENT_CLINIC_OR_DEPARTMENT_OTHER)
Admission: EM | Admit: 2017-07-18 | Discharge: 2017-07-18 | Disposition: A | Discharge status: Home / Self Care | Payer: Medicare HMO | Attending: Emergency Medicine | Admitting: Emergency Medicine

## 2017-07-18 ENCOUNTER — Emergency Department (HOSPITAL_BASED_OUTPATIENT_CLINIC_OR_DEPARTMENT_OTHER): Payer: Medicare HMO

## 2017-07-18 DIAGNOSIS — I252 Old myocardial infarction: Secondary | ICD-10-CM | POA: Insufficient documentation

## 2017-07-18 DIAGNOSIS — N183 Chronic kidney disease, stage 3 (moderate): Secondary | ICD-10-CM | POA: Insufficient documentation

## 2017-07-18 DIAGNOSIS — J449 Chronic obstructive pulmonary disease, unspecified: Secondary | ICD-10-CM | POA: Insufficient documentation

## 2017-07-18 DIAGNOSIS — I129 Hypertensive chronic kidney disease with stage 1 through stage 4 chronic kidney disease, or unspecified chronic kidney disease: Secondary | ICD-10-CM | POA: Diagnosis not present

## 2017-07-18 DIAGNOSIS — Z7902 Long term (current) use of antithrombotics/antiplatelets: Secondary | ICD-10-CM | POA: Diagnosis not present

## 2017-07-18 DIAGNOSIS — Z8673 Personal history of transient ischemic attack (TIA), and cerebral infarction without residual deficits: Secondary | ICD-10-CM | POA: Insufficient documentation

## 2017-07-18 DIAGNOSIS — R55 Syncope and collapse: Secondary | ICD-10-CM | POA: Insufficient documentation

## 2017-07-18 DIAGNOSIS — R079 Chest pain, unspecified: Secondary | ICD-10-CM | POA: Diagnosis not present

## 2017-07-18 DIAGNOSIS — E039 Hypothyroidism, unspecified: Secondary | ICD-10-CM | POA: Diagnosis not present

## 2017-07-18 DIAGNOSIS — M25521 Pain in right elbow: Secondary | ICD-10-CM | POA: Insufficient documentation

## 2017-07-18 DIAGNOSIS — Z7982 Long term (current) use of aspirin: Secondary | ICD-10-CM | POA: Diagnosis not present

## 2017-07-18 DIAGNOSIS — Z950 Presence of cardiac pacemaker: Secondary | ICD-10-CM | POA: Diagnosis not present

## 2017-07-18 DIAGNOSIS — Z79899 Other long term (current) drug therapy: Secondary | ICD-10-CM | POA: Diagnosis not present

## 2017-07-18 DIAGNOSIS — Z87891 Personal history of nicotine dependence: Secondary | ICD-10-CM | POA: Diagnosis not present

## 2017-07-18 DIAGNOSIS — I2510 Atherosclerotic heart disease of native coronary artery without angina pectoris: Secondary | ICD-10-CM | POA: Insufficient documentation

## 2017-07-18 DIAGNOSIS — W19XXXA Unspecified fall, initial encounter: Secondary | ICD-10-CM

## 2017-07-18 LAB — BASIC METABOLIC PANEL
Anion gap: 8 (ref 5–15)
BUN: 20 mg/dL (ref 6–20)
CALCIUM: 8.6 mg/dL — AB (ref 8.9–10.3)
CO2: 25 mmol/L (ref 22–32)
Chloride: 104 mmol/L (ref 101–111)
Creatinine, Ser: 1.18 mg/dL — ABNORMAL HIGH (ref 0.44–1.00)
GFR calc Af Amer: 52 mL/min — ABNORMAL LOW (ref >60)
GFR, EST NON AFRICAN AMERICAN: 45 mL/min — AB (ref >60)
GLUCOSE: 99 mg/dL (ref 65–99)
Potassium: 3.5 mmol/L (ref 3.5–5.1)
Sodium: 137 mmol/L (ref 135–145)

## 2017-07-18 LAB — CBC WITH DIFFERENTIAL/PLATELET
BASOS ABS: 0 10*3/uL (ref 0.0–0.1)
BASOS PCT: 0 %
EOS PCT: 0 %
Eosinophils Absolute: 0 10*3/uL (ref 0.0–0.7)
HEMATOCRIT: 38.3 % (ref 36.0–46.0)
Hemoglobin: 12.7 g/dL (ref 12.0–15.0)
LYMPHS PCT: 15 %
Lymphs Abs: 1.3 10*3/uL (ref 0.7–4.0)
MCH: 29.4 pg (ref 26.0–34.0)
MCHC: 33.2 g/dL (ref 30.0–36.0)
MCV: 88.7 fL (ref 78.0–100.0)
MONO ABS: 1 10*3/uL (ref 0.1–1.0)
MONOS PCT: 12 %
Neutro Abs: 6.4 10*3/uL (ref 1.7–7.7)
Neutrophils Relative %: 73 %
PLATELETS: 131 10*3/uL — AB (ref 150–400)
RBC: 4.32 MIL/uL (ref 3.87–5.11)
RDW: 13.5 % (ref 11.5–15.5)
WBC: 8.7 10*3/uL (ref 4.0–10.5)

## 2017-07-18 LAB — ETHANOL: Alcohol, Ethyl (B): <10 mg/dL (ref <10)

## 2017-07-18 LAB — I-STAT ARTERIAL BLOOD GAS, ED
Acid-Base Excess: 1 mmol/L (ref 0.0–2.0)
BICARBONATE: 26 mmol/L (ref 20.0–28.0)
O2 Saturation: 96 %
PCO2 ART: 40.2 mmHg (ref 32.0–48.0)
TCO2: 27 mmol/L (ref 22–32)
pH, Arterial: 7.417 (ref 7.350–7.450)
pO2, Arterial: 76 mmHg — ABNORMAL LOW (ref 83.0–108.0)

## 2017-07-18 LAB — CBG MONITORING, ED: GLUCOSE-CAPILLARY: 93 mg/dL (ref 65–99)

## 2017-07-18 LAB — PROTIME-INR
INR: 1.04
Prothrombin Time: 13.5 seconds (ref 11.4–15.2)

## 2017-07-18 LAB — CK: CK TOTAL: 358 U/L — AB (ref 38–234)

## 2017-07-18 MED ORDER — SODIUM CHLORIDE 0.9 % IV SOLN
INTRAVENOUS | Status: DC
  Administered 2017-07-18: 18:00:00 via INTRAVENOUS

## 2017-07-18 MED ORDER — MORPHINE SULFATE (PF) 2 MG/ML IV SOLN
2.0000 mg | Freq: Once | INTRAVENOUS | Status: AC
  Administered 2017-07-18: 2 mg via INTRAVENOUS
  Filled 2017-07-18: qty 1

## 2017-07-18 NOTE — ED Notes (Signed)
Abrasions to bilateral elbows, bilateral knees.

## 2017-07-18 NOTE — Discharge Instructions (Signed)
Follow-up with your primary care doctor.  Return to the emergency room if you start feeling worse and change your mind about needing to be in the hospital

## 2017-07-18 NOTE — ED Triage Notes (Signed)
Fall. She fell this am. She does not remember events of the fall. Pain and bruising to her right breast and right arm. A friend came to check on her and found her on the floor and got help to get her up. Pt states she falls a lot. She is on a blood thinner. She is alert oriented at time of triage.

## 2017-07-18 NOTE — ED Notes (Signed)
ED Provider at bedside. 

## 2017-07-18 NOTE — ED Provider Notes (Signed)
[facility] EMERGENCY DEPARTMENT Provider Note   CSN: [id number] Arrival date & time: 07/18/17  1504     History   Chief Complaint Chief Complaint  Patient presents with  . Fall    HPI [name] is a 71 y.o. female.  HPI Patient presents to the emergency room for evaluation after a fall/syncopal event.  Patient has multiple medical problems however she felt that she was in her usual state of health yesterday.  She did not have any acute complaints before going to bed.  Patient states at some point during the night she must of walked to the bathroom.  She does not know what happened however she woke up this morning lying in her bathtub.  She feels like she must have fallen into the bathtub.  She felt very weak and she was unable to get up.  She had to call for assistance.  Patient has some pain in the right side of her chest and she also noticed a bruise on her right breast.  She has some pain in her right elbow.  She denies any headache but does feel tired.  She denies any shortness of breath or abdominal pain.  She feels generally weak but does not notice any difficulty moving one side versus the other. Past Medical History:  Diagnosis Date  . Anxiety   . Blood transfusion without reported diagnosis    10/29/2006  . Bulging disc   . Carotid arterial disease (HCC)    a. s/p L intracranial ICA stenting (Deveshwar).  . Cataract 05/2012  . Chronic kidney disease, stage III (moderate) (HCC)   . COPD (chronic obstructive pulmonary disease) ([place])    a. quit smoking in 2007.  Marland Kitchen Coronary artery disease    a. 2007 s/p MI and LAD stenting;  10/2006 CABG x 4: LIMA->LAD, VG->OM->dLCX, VG->RPL;  c. 07/2013 MV: no ischemia.  . Depression   . Esophageal reflux   . History of echocardiogram    a. 01/2012 Echo: EF 55%, mildly dil LA/RV, mild MR, Ao Sclerosis, mod TR.  Marland Kitchen Hypothyroid   . Insomnia   . Macular degeneration   . Mixed hyperlipidemia   . Osteoporosis   . PVT  (paroxysmal ventricular tachycardia) ([place])   . Restless leg syndrome   . Stroke ([place])   . Symptomatic bradycardia    a. s/p BSX Advantio dual chamber PPM Beckie Salts).  . Syncope and collapse   . Unspecified hearing loss     Patient Active Problem List   Diagnosis Date Noted  . Acute lower UTI 04/15/2017  . Syncope and collapse 04/15/2017  . CKD (chronic kidney disease) stage 3, GFR 30-59 ml/min (HCC) 04/15/2017  . CAD (coronary artery disease) S/P CABG 04/14/2017  . Sinus node dysfunction ([place]) 08/21/2013  . Atrial tachycardia ([place]) 08/21/2013  . Pacemaker -[employer] 08/21/2013  . Coronary atherosclerosis 11/21/2009  . GASTRIC ULCER 01/21/2009  . DIARRHEA, CHRONIC 12/31/2008  . IRON DEFICIENCY 11/28/2008  . LEG EDEMA 11/19/2008  . Chronic respiratory failure with hypoxia ([place]) 11/19/2008  . Angina pectoris ([place]) 11/19/2008  . HEADACHE 08/19/2008  . MACULAR DEGENERATION 02/20/2008  . DISORDER, TOBACCO USE 02/09/2008  . CAROTID ARTERY DISEASE 02/13/2007  . TRANSIENT ISCHEMIC ATTACK 02/13/2007  . INSOMNIA 02/13/2007  . Restless legs syndrome (RLS) 01/27/2007  . OSTEOPENIA 01/13/2007  . ALKALINE PHOSPHATASE, ELEVATED 11/11/2006  . HEMORRHAGE, CONJUNCTIVAL 11/04/2006  . MALAISE AND FATIGUE 11/04/2006  . CERVICAL CANCER 10/11/2006  . Hyperlipidemia 10/11/2006  .  Anxiety 10/11/2006  . Depression 10/11/2006  . Essential hypertension 10/11/2006  . MYOCARDIAL INFARCTION, HX OF 10/11/2006  . GERD 10/11/2006  . OSTEOARTHRITIS 10/11/2006  . LOW BACK PAIN 10/11/2006    Past Surgical History:  Procedure Laterality Date  . CARDIAC CATHETERIZATION  2007   x2;[place]  . CARDIAC CATHETERIZATION  2008  . CATARACT EXTRACTION Right   . CEREBRAL ANGIOGRAM    . colonoscopy    . CORONARY ARTERY BYPASS GRAFT     x 4  . EYE SURGERY  05/2013   cataract removed  . GALLBLADDER SURGERY    . INSERT / REPLACE / REMOVE PACEMAKER  2013   [place], [place]; [employer]  . KNEE  ARTHROSCOPY    . LUMBAR DISC SURGERY    . PARTIAL HYSTERECTOMY    . SPINE SURGERY  1999/2001/2006   3 disc replaced    OB History    No data available       Home Medications    Prior to Admission medications   Medication Sig Start Date End Date Taking? Authorizing Provider  albuterol (PROVENTIL HFA;VENTOLIN HFA) 108 (90 Base) MCG/ACT inhaler Inhale 1-2 puffs into the lungs every 6 (six) hours as needed for wheezing or shortness of breath. 04/19/17   Rigoberto Noel, MD  ALPRAZolam Duanne Moron) 1 MG tablet Take 1 tablet by mouth at bedtime. 01/06/17   [provider]  aspirin 81 MG tablet Take 81 mg by mouth daily.    [provider]  atorvastatin (LIPITOR) 20 MG tablet Take 20 mg by mouth daily.    [provider]  clopidogrel (PLAVIX) 75 MG tablet Take 75 mg by mouth daily.    [provider]  diphenhydrAMINE (BENADRYL) 25 MG tablet Take 50 mg by mouth at bedtime as needed for sleep.     [provider]  Ferrous Sulfate (IRON) 325 (65 Fe) MG TABS Take 1 tablet by mouth daily.    [provider]  folic acid (FOLVITE) 269 MCG tablet Take 800 mcg by mouth daily.    [provider]  furosemide (LASIX) 20 MG tablet Take 20 mg by mouth as needed.    [provider]  isosorbide mononitrate (IMDUR) 60 MG 24 hr tablet Take 60 mg by mouth daily.    [provider]  levothyroxine (SYNTHROID, LEVOTHROID) 75 MCG tablet TAKE 1 TABLET EVERY DAY  (NEW  DOSE) 07/01/17   Susy Frizzle, MD  Magnesium 250 MG TABS Take 1 tablet by mouth daily.    [provider]  Melatonin 10 MG TABS Take 20 mg by mouth at bedtime.    [provider]  metoprolol tartrate (LOPRESSOR) 25 MG tablet Take 25 mg by mouth 2 (two) times daily.    [provider]  nitroGLYCERIN (NITROSTAT) 0.4 MG SL tablet Place 0.4 mg under the tongue every 5 (five) minutes as needed for chest pain.    [provider]  pantoprazole  (PROTONIX) 40 MG tablet Take 40 mg by mouth daily.    [provider]  pramipexole (MIRAPEX) 1 MG tablet Take 2 mg by mouth daily.     [provider]  sertraline (ZOLOFT) 100 MG tablet Take 200 mg by mouth at bedtime.    [provider]  traZODone (DESYREL) 100 MG tablet Take 200 mg by mouth at bedtime.    [provider]    Family History Family History  Problem Relation Age of Onset  . Stomach cancer Mother   .  Diabetes Mother   . Hyperlipidemia Mother   . Hypertension Father   . Lung cancer Father   . Alcohol abuse Father   . Early death Brother     Social History Social History  Substance Use Topics  . Smoking status: Former Smoker    Packs/day: 0.75    Years: 40.00    Types: Cigarettes    Start date: 09/28/1965    Quit date: 05/27/2006  . Smokeless tobacco: Never Used  . Alcohol use No     Allergies   Codeine and Iohexol   Review of Systems Review of Systems  All other systems reviewed and are negative.    Physical Exam Updated Vital Signs BP 131/65 (BP Location: Right Arm)   Pulse 70   Temp 97.8 F (36.6 C) (Oral)   Resp 18   Ht 1.575 m (5\' 2" )   Wt 90.7 kg (200 lb)   SpO2 100%   BMI 36.58 kg/m   Physical Exam  Constitutional: No distress.  Overweight, listless  HENT:  Head: Normocephalic.  Right Ear: External ear normal.  Left Ear: External ear normal.  Mild erythema to the nose, no deformity, no facial tenderness to palpation  Eyes: Conjunctivae are normal. Right eye exhibits no discharge. Left eye exhibits no discharge. No scleral icterus.  Neck: Neck supple. No tracheal deviation present.  Cardiovascular: Normal rate, regular rhythm and intact distal pulses.   Pulmonary/Chest: Effort normal and breath sounds normal. No stridor. No respiratory distress. She has no wheezes. She has no rales.  Abdominal: Soft. Bowel sounds are normal. She exhibits no distension. There is no tenderness. There is no rebound and  no guarding.  Musculoskeletal: She exhibits no edema.       Right shoulder: She exhibits no tenderness, no bony tenderness and no swelling.       Left shoulder: She exhibits no tenderness, no bony tenderness and no swelling.       Right elbow: Tenderness (Bruising) found.       Right wrist: She exhibits no tenderness, no bony tenderness and no swelling.       Left wrist: She exhibits no tenderness, no bony tenderness and no swelling.       Right hip: She exhibits normal range of motion, no tenderness, no bony tenderness and no swelling.       Left hip: She exhibits normal range of motion, no tenderness and no bony tenderness.       Right ankle: She exhibits no swelling. No tenderness.       Left ankle: She exhibits no swelling. No tenderness.       Cervical back: She exhibits no tenderness, no bony tenderness and no swelling.       Thoracic back: She exhibits no tenderness, no bony tenderness and no swelling.       Lumbar back: She exhibits no tenderness, no bony tenderness and no swelling.  Neurological: She is alert. She has normal strength. No cranial nerve deficit (no facial droop, extraocular movements intact, no slurred speech) or sensory deficit. She exhibits normal muscle tone. She displays no seizure activity. Coordination normal.  Skin: Skin is warm and dry. No rash noted. She is not diaphoretic.  Psychiatric: She has a normal mood and affect.  Nursing note and vitals reviewed.    ED Treatments / Results  Labs (all labs ordered are listed, but only abnormal results are displayed) Labs Reviewed  BASIC METABOLIC PANEL - Abnormal; Notable for the following:  Result Value   Creatinine, Ser 1.18 (*)    Calcium 8.6 (*)    GFR calc non Af Amer 45 (*)    GFR calc Af Amer 52 (*)    All other components within normal limits  CBC WITH DIFFERENTIAL/PLATELET - Abnormal; Notable for the following:    Platelets 131 (*)    All other components within normal limits  CK - Abnormal;  Notable for the following:    Total CK 358 (*)    All other components within normal limits  I-STAT ARTERIAL BLOOD GAS, ED - Abnormal; Notable for the following:    pO2, Arterial 76.0 (*)    All other components within normal limits  PROTIME-INR  ETHANOL  URINALYSIS, ROUTINE W REFLEX MICROSCOPIC  CBG MONITORING, ED    EKG  EKG Interpretation  Date/Time:  Monday July 18 2017 15:39:12 EDT Ventricular Rate:  70 PR Interval:    QRS Duration: 112 QT Interval:  415 QTC Calculation: 448 R Axis:   114 Text Interpretation:  Atrial-paced rhythm Incomplete right bundle branch block Low voltage, precordial leads Baseline wander in lead(s) V6 Confirmed by Dorie Rank [phone]) on 07/18/2017 3:53:15 PM       Radiology Dg Chest 2 View  Result Date: 07/18/2017 CLINICAL DATA:  Patient states that she fell today but doesn't remember falling, has bruising to right breast area and right arm, hx of PVT, COPD, chronic kidney disease, no other complaints EXAM: CHEST  2 VIEW COMPARISON:  Chest x-ray dated 04/14/2017. FINDINGS: Mild cardiomegaly is stable. Overall cardiomediastinal silhouette is stable. Atherosclerotic changes noted at the aortic arch. Left chest wall pacemaker/ICD apparatus appears stable. Lungs are clear. No pleural effusion or pneumothorax seen. No acute or suspicious osseous finding. IMPRESSION: No acute findings. No evidence of pneumonia or pulmonary edema. No osseous fracture or dislocation seen. Aortic atherosclerosis. Stable mild cardiomegaly. Electronically Signed   By: Franki Cabot M.D.   On: 07/18/2017 16:07   Ct Head Wo Contrast  Result Date: 07/18/2017 CLINICAL DATA:  Fall today, multiple falls recently. EXAM: CT HEAD WITHOUT CONTRAST TECHNIQUE: Contiguous axial images were obtained from the base of the skull through the vertex without intravenous contrast. COMPARISON:  Head CT dated 04/14/2017. FINDINGS: Brain: Mild generalize age related parenchymal atrophy with  commensurate dilatation of the ventricles and sulci. No mass, hemorrhage, edema or other evidence of acute parenchymal abnormality. No extra-axial hemorrhage. Vascular: Left middle cerebral artery stent appears stable in position. There are chronic calcified atherosclerotic changes of the large vessels at the skull base. No unexpected hyperdense vessel. Skull: Normal. Negative for fracture or focal lesion. Sinuses/Orbits: Mild mucosal thickening within the right maxillary sinus. Remainder of the visualized upper paranasal sinuses are clear. Mastoid air cells are clear. Periorbital and retro-orbital soft tissues are unremarkable. Other: None. IMPRESSION: 1. No acute findings. No intracranial mass, hemorrhage or edema. No skull fracture. 2. Left MCA stent appears stable in position. Electronically Signed   By: Franki Cabot M.D.   On: 07/18/2017 16:12    Procedures Procedures (including critical care time)  Medications Ordered in ED Medications  0.9 %  sodium chloride infusion ( Intravenous New Bag/Given 07/18/17 1807)  morphine 2 MG/ML injection 2 mg (2 mg Intravenous Given 07/18/17 1806)     Initial Impression / Assessment and Plan / ED Course  I have reviewed the triage vital signs and the nursing notes.  Pertinent labs & imaging results that were available during my care of the patient were reviewed  by me and considered in my medical decision making (see chart for details).  Clinical Course as of Jul 18 1940  Mon Jul 18, 2017  1801 Pt is requesting pain meds.  Usually she takes vicodin at home.  [JK]    Clinical Course User Index [JK] Dorie Rank, MD    Patient presented to the emergency room after a fall at home.  Patient was unclear if she had a syncopal episode or she just fell.  Patient states she was not able to get up for several hours.  Her evaluation in the emergency room is reassuring and her  CT scan does not show any acute injuries.  However, I was concerned with her fall.  I  cannot explain what exactly occurred.  It certainly is possible she could have had a TIA or syncopal episode.  I recommended that the patient be admitted to the hospital for observation and further eval. She is adamant that she does not want to be admitted.  She was able to walk into the emergency room.  She feels like she can safely go home and follow-up with her doctor.  She has been having gait issues ever since her old stroke and thinks it is possible she may have just stumbled and fell.  Patient understands she can return at any time  Final Clinical Impressions(s) / ED Diagnoses   Final diagnoses:  Fall, initial encounter    New Prescriptions New Prescriptions   No medications on file     Dorie Rank, MD 07/18/17 1943

## 2017-07-18 NOTE — ED Notes (Signed)
Pt on monitor 

## 2017-07-27 ENCOUNTER — Encounter (HOSPITAL_COMMUNITY): Payer: Self-pay

## 2017-07-27 DIAGNOSIS — J449 Chronic obstructive pulmonary disease, unspecified: Secondary | ICD-10-CM | POA: Diagnosis not present

## 2017-07-27 DIAGNOSIS — N189 Chronic kidney disease, unspecified: Secondary | ICD-10-CM | POA: Insufficient documentation

## 2017-07-27 DIAGNOSIS — R0602 Shortness of breath: Secondary | ICD-10-CM | POA: Insufficient documentation

## 2017-07-27 DIAGNOSIS — R55 Syncope and collapse: Principal | ICD-10-CM | POA: Insufficient documentation

## 2017-07-27 DIAGNOSIS — Z87891 Personal history of nicotine dependence: Secondary | ICD-10-CM | POA: Diagnosis not present

## 2017-07-27 DIAGNOSIS — Z7901 Long term (current) use of anticoagulants: Secondary | ICD-10-CM | POA: Insufficient documentation

## 2017-07-27 DIAGNOSIS — E039 Hypothyroidism, unspecified: Secondary | ICD-10-CM | POA: Diagnosis not present

## 2017-07-27 DIAGNOSIS — Z8673 Personal history of transient ischemic attack (TIA), and cerebral infarction without residual deficits: Secondary | ICD-10-CM | POA: Insufficient documentation

## 2017-07-27 DIAGNOSIS — R200 Anesthesia of skin: Secondary | ICD-10-CM | POA: Diagnosis not present

## 2017-07-27 DIAGNOSIS — Z79899 Other long term (current) drug therapy: Secondary | ICD-10-CM | POA: Diagnosis not present

## 2017-07-27 LAB — BASIC METABOLIC PANEL
ANION GAP: 8 (ref 5–15)
BUN: 12 mg/dL (ref 6–20)
CO2: 27 mmol/L (ref 22–32)
Calcium: 9 mg/dL (ref 8.9–10.3)
Chloride: 104 mmol/L (ref 101–111)
Creatinine, Ser: 1.39 mg/dL — ABNORMAL HIGH (ref 0.44–1.00)
GFR calc Af Amer: 43 mL/min — ABNORMAL LOW (ref >60)
GFR, EST NON AFRICAN AMERICAN: 37 mL/min — AB (ref >60)
GLUCOSE: 101 mg/dL — AB (ref 65–99)
POTASSIUM: 3.8 mmol/L (ref 3.5–5.1)
Sodium: 139 mmol/L (ref 135–145)

## 2017-07-27 LAB — CBC
HEMATOCRIT: 39.5 % (ref 36.0–46.0)
HEMOGLOBIN: 12.9 g/dL (ref 12.0–15.0)
MCH: 29.3 pg (ref 26.0–34.0)
MCHC: 32.7 g/dL (ref 30.0–36.0)
MCV: 89.6 fL (ref 78.0–100.0)
Platelets: 169 10*3/uL (ref 150–400)
RBC: 4.41 MIL/uL (ref 3.87–5.11)
RDW: 13.5 % (ref 11.5–15.5)
WBC: 7.1 10*3/uL (ref 4.0–10.5)

## 2017-07-27 NOTE — ED Triage Notes (Signed)
Patient complains of syncopal event x 2 the past month, the last being 2 days ago. Patient states that she laid in shower for almost 8 hours prior to coming around. Complains of left knee with ambulation. On assessment alert and oriented, no SOB, no CP. Arrived by wheelchair

## 2017-07-28 ENCOUNTER — Emergency Department (HOSPITAL_COMMUNITY): Payer: Medicare HMO

## 2017-07-28 ENCOUNTER — Observation Stay (HOSPITAL_COMMUNITY): Payer: Medicare HMO

## 2017-07-28 ENCOUNTER — Observation Stay (HOSPITAL_COMMUNITY)
Admission: EM | Admit: 2017-07-28 | Discharge: 2017-07-30 | Disposition: A | Discharge status: Home / Self Care | Payer: Medicare HMO | Attending: Internal Medicine | Admitting: Internal Medicine

## 2017-07-28 ENCOUNTER — Observation Stay (HOSPITAL_BASED_OUTPATIENT_CLINIC_OR_DEPARTMENT_OTHER): Payer: Medicare HMO

## 2017-07-28 DIAGNOSIS — I5032 Chronic diastolic (congestive) heart failure: Secondary | ICD-10-CM | POA: Diagnosis present

## 2017-07-28 DIAGNOSIS — G2581 Restless legs syndrome: Secondary | ICD-10-CM | POA: Diagnosis present

## 2017-07-28 DIAGNOSIS — R55 Syncope and collapse: Secondary | ICD-10-CM | POA: Diagnosis present

## 2017-07-28 DIAGNOSIS — E039 Hypothyroidism, unspecified: Secondary | ICD-10-CM | POA: Diagnosis present

## 2017-07-28 DIAGNOSIS — N183 Chronic kidney disease, stage 3 unspecified: Secondary | ICD-10-CM | POA: Diagnosis present

## 2017-07-28 DIAGNOSIS — R001 Bradycardia, unspecified: Secondary | ICD-10-CM | POA: Diagnosis not present

## 2017-07-28 DIAGNOSIS — J449 Chronic obstructive pulmonary disease, unspecified: Secondary | ICD-10-CM | POA: Diagnosis present

## 2017-07-28 DIAGNOSIS — I4729 Other ventricular tachycardia: Secondary | ICD-10-CM

## 2017-07-28 DIAGNOSIS — R0602 Shortness of breath: Secondary | ICD-10-CM

## 2017-07-28 DIAGNOSIS — I340 Nonrheumatic mitral (valve) insufficiency: Secondary | ICD-10-CM

## 2017-07-28 DIAGNOSIS — I272 Pulmonary hypertension, unspecified: Secondary | ICD-10-CM | POA: Diagnosis present

## 2017-07-28 DIAGNOSIS — R200 Anesthesia of skin: Secondary | ICD-10-CM

## 2017-07-28 DIAGNOSIS — I2510 Atherosclerotic heart disease of native coronary artery without angina pectoris: Secondary | ICD-10-CM | POA: Diagnosis present

## 2017-07-28 DIAGNOSIS — I472 Ventricular tachycardia, unspecified: Secondary | ICD-10-CM

## 2017-07-28 DIAGNOSIS — E782 Mixed hyperlipidemia: Secondary | ICD-10-CM | POA: Diagnosis present

## 2017-07-28 HISTORY — DX: Unspecified osteoarthritis, unspecified site: M19.90

## 2017-07-28 HISTORY — DX: Inflammatory liver disease, unspecified: K75.9

## 2017-07-28 HISTORY — DX: Non-ST elevation (NSTEMI) myocardial infarction: I21.4

## 2017-07-28 HISTORY — DX: Presence of cardiac pacemaker: Z95.0

## 2017-07-28 HISTORY — DX: Malignant neoplasm of cervix uteri, unspecified: C53.9

## 2017-07-28 HISTORY — DX: Pneumonia, unspecified organism: J18.9

## 2017-07-28 HISTORY — DX: Personal history of other medical treatment: Z92.89

## 2017-07-28 LAB — URINALYSIS, ROUTINE W REFLEX MICROSCOPIC
Bilirubin Urine: NEGATIVE
GLUCOSE, UA: NEGATIVE mg/dL
Ketones, ur: NEGATIVE mg/dL
Nitrite: POSITIVE — AB
PH: 6 (ref 5.0–8.0)
Protein, ur: NEGATIVE mg/dL
Specific Gravity, Urine: 1.01 (ref 1.005–1.030)

## 2017-07-28 LAB — ECHOCARDIOGRAM COMPLETE
EERAT: 13.45
EWDT: 187 ms
FS: 26 % — AB (ref 28–44)
IV/PV OW: 0.87
LA diam index: 2.46 cm/m2
LA vol index: 29.3 mL/m2
LASIZE: 47 mm
LAVOL: 56 mL
LAVOLA4C: 39.9 mL
LEFT ATRIUM END SYS DIAM: 47 mm
LVEEAVG: 13.45
LVEEMED: 13.45
LVELAT: 7.51 cm/s
LVOT area: 3.14 cm2
LVOT diameter: 20 mm
Lateral S' vel: 7.51 cm/s
MV Dec: 187
MV Peak grad: 4 mmHg
MV pk E vel: 101 m/s
MVPKAVEL: 90.6 m/s
PW: 9.04 mm — AB (ref 0.6–1.1)
RV TAPSE: 28.7 mm
RV sys press: 48 mmHg
Reg peak vel: 286 cm/s
TDI e' lateral: 7.51
TDI e' medial: 6.64
TRMAXVEL: 286 cm/s

## 2017-07-28 LAB — TROPONIN I: Troponin I: <0.03 ng/mL (ref <0.03)

## 2017-07-28 LAB — CK: Total CK: 43 U/L (ref 38–234)

## 2017-07-28 MED ORDER — TRAZODONE HCL 100 MG PO TABS
100.0000 mg | ORAL_TABLET | Freq: Every day | ORAL | Status: DC
  Administered 2017-07-28 – 2017-07-29 (×2): 100 mg via ORAL
  Filled 2017-07-28 (×2): qty 1

## 2017-07-28 MED ORDER — ATORVASTATIN CALCIUM 20 MG PO TABS
20.0000 mg | ORAL_TABLET | Freq: Every day | ORAL | Status: DC
  Administered 2017-07-28 – 2017-07-29 (×2): 20 mg via ORAL
  Filled 2017-07-28 (×2): qty 1

## 2017-07-28 MED ORDER — ACETAMINOPHEN 650 MG RE SUPP: 650.0000 mg | Freq: Four times a day (QID) | RECTAL | Status: DC | PRN

## 2017-07-28 MED ORDER — ONDANSETRON HCL 4 MG/2ML IJ SOLN: 4.0000 mg | Freq: Three times a day (TID) | INTRAMUSCULAR | Status: DC | PRN

## 2017-07-28 MED ORDER — FERROUS SULFATE 325 (65 FE) MG PO TABS
325.0000 mg | ORAL_TABLET | Freq: Every day | ORAL | Status: DC
  Administered 2017-07-29 – 2017-07-30 (×2): 325 mg via ORAL
  Filled 2017-07-28 (×3): qty 1

## 2017-07-28 MED ORDER — METOPROLOL TARTRATE 25 MG PO TABS: 25.0000 mg | ORAL_TABLET | Freq: Two times a day (BID) | ORAL | Status: DC

## 2017-07-28 MED ORDER — PRAMIPEXOLE DIHYDROCHLORIDE 1 MG PO TABS
2.0000 mg | ORAL_TABLET | Freq: Every day | ORAL | Status: DC
  Administered 2017-07-28 – 2017-07-29 (×2): 2 mg via ORAL
  Filled 2017-07-28 (×5): qty 2

## 2017-07-28 MED ORDER — ONDANSETRON HCL 4 MG/2ML IJ SOLN
4.0000 mg | Freq: Once | INTRAMUSCULAR | Status: AC
  Administered 2017-07-28: 4 mg via INTRAVENOUS
  Filled 2017-07-28: qty 2

## 2017-07-28 MED ORDER — PANTOPRAZOLE SODIUM 40 MG PO TBEC
40.0000 mg | DELAYED_RELEASE_TABLET | Freq: Every day | ORAL | Status: DC
  Administered 2017-07-28 – 2017-07-30 (×3): 40 mg via ORAL
  Filled 2017-07-28 (×3): qty 1

## 2017-07-28 MED ORDER — ACETAMINOPHEN 325 MG PO TABS: 650.0000 mg | ORAL_TABLET | Freq: Four times a day (QID) | ORAL | Status: DC | PRN

## 2017-07-28 MED ORDER — NITROGLYCERIN 0.4 MG SL SUBL: 0.4000 mg | SUBLINGUAL_TABLET | SUBLINGUAL | Status: DC | PRN

## 2017-07-28 MED ORDER — HYDRALAZINE HCL 20 MG/ML IJ SOLN: 5.0000 mg | INTRAMUSCULAR | Status: DC | PRN

## 2017-07-28 MED ORDER — CLOPIDOGREL BISULFATE 75 MG PO TABS
75.0000 mg | ORAL_TABLET | Freq: Every day | ORAL | Status: DC
  Administered 2017-07-28 – 2017-07-30 (×3): 75 mg via ORAL
  Filled 2017-07-28 (×3): qty 1

## 2017-07-28 MED ORDER — FOLIC ACID 1 MG PO TABS
1000.0000 ug | ORAL_TABLET | Freq: Every day | ORAL | Status: DC
  Administered 2017-07-28 – 2017-07-30 (×3): 1 mg via ORAL
  Filled 2017-07-28 (×3): qty 1

## 2017-07-28 MED ORDER — SODIUM CHLORIDE 0.9% FLUSH
3.0000 mL | Freq: Two times a day (BID) | INTRAVENOUS | Status: DC
  Administered 2017-07-28 – 2017-07-29 (×3): 3 mL via INTRAVENOUS

## 2017-07-28 MED ORDER — ASPIRIN 81 MG PO CHEW
81.0000 mg | CHEWABLE_TABLET | Freq: Every day | ORAL | Status: DC
  Administered 2017-07-28 – 2017-07-30 (×3): 81 mg via ORAL
  Filled 2017-07-28 (×3): qty 1

## 2017-07-28 MED ORDER — ENOXAPARIN SODIUM 40 MG/0.4ML ~~LOC~~ SOLN
40.0000 mg | SUBCUTANEOUS | Status: DC
  Administered 2017-07-28 – 2017-07-29 (×2): 40 mg via SUBCUTANEOUS
  Filled 2017-07-28 (×2): qty 0.4

## 2017-07-28 MED ORDER — SERTRALINE HCL 100 MG PO TABS: 200.0000 mg | ORAL_TABLET | Freq: Every day | ORAL | Status: DC

## 2017-07-28 MED ORDER — DIPHENHYDRAMINE HCL 25 MG PO CAPS: 50.0000 mg | ORAL_CAPSULE | Freq: Every evening | ORAL | Status: DC | PRN

## 2017-07-28 MED ORDER — LEVOTHYROXINE SODIUM 75 MCG PO TABS
75.0000 ug | ORAL_TABLET | Freq: Every day | ORAL | Status: DC
  Administered 2017-07-29 – 2017-07-30 (×2): 75 ug via ORAL
  Filled 2017-07-28 (×3): qty 1

## 2017-07-28 MED ORDER — ACETAMINOPHEN 325 MG PO TABS
650.0000 mg | ORAL_TABLET | Freq: Four times a day (QID) | ORAL | Status: DC | PRN
  Administered 2017-07-29: 650 mg via ORAL
  Filled 2017-07-28: qty 2

## 2017-07-28 MED ORDER — CEFTRIAXONE SODIUM 1 G IJ SOLR
1.0000 g | INTRAMUSCULAR | Status: DC
  Administered 2017-07-28 – 2017-07-29 (×2): 1 g via INTRAVENOUS
  Filled 2017-07-28 (×3): qty 10

## 2017-07-28 MED ORDER — ZOLPIDEM TARTRATE 5 MG PO TABS: 5.0000 mg | ORAL_TABLET | Freq: Every evening | ORAL | Status: DC | PRN

## 2017-07-28 MED ORDER — FENTANYL CITRATE (PF) 100 MCG/2ML IJ SOLN
50.0000 ug | Freq: Once | INTRAMUSCULAR | Status: AC
  Administered 2017-07-28: 50 ug via INTRAVENOUS
  Filled 2017-07-28: qty 2

## 2017-07-28 MED ORDER — ALBUTEROL SULFATE (2.5 MG/3ML) 0.083% IN NEBU: 3.0000 mL | INHALATION_SOLUTION | Freq: Four times a day (QID) | RESPIRATORY_TRACT | Status: DC | PRN

## 2017-07-28 MED ORDER — MELATONIN 10 MG PO TABS
20.0000 mg | ORAL_TABLET | Freq: Every day | ORAL | Status: DC
Start: 2017-07-28 — End: 2017-07-28

## 2017-07-28 MED ORDER — TIOTROPIUM BROMIDE MONOHYDRATE 18 MCG IN CAPS
18.0000 ug | ORAL_CAPSULE | Freq: Every day | RESPIRATORY_TRACT | Status: DC
  Administered 2017-07-29 – 2017-07-30 (×2): 18 ug via RESPIRATORY_TRACT
  Filled 2017-07-28 (×2): qty 5

## 2017-07-28 MED ORDER — MAGNESIUM 250 MG PO TABS: 1.0000 | ORAL_TABLET | Freq: Every day | ORAL | Status: DC

## 2017-07-28 MED ORDER — TRAZODONE HCL 50 MG PO TABS: 200.0000 mg | ORAL_TABLET | Freq: Every day | ORAL | Status: DC

## 2017-07-28 MED ORDER — SERTRALINE HCL 100 MG PO TABS
100.0000 mg | ORAL_TABLET | Freq: Every day | ORAL | Status: DC
  Administered 2017-07-28 – 2017-07-29 (×2): 100 mg via ORAL
  Filled 2017-07-28 (×2): qty 1

## 2017-07-28 MED ORDER — ALPRAZOLAM 0.5 MG PO TABS
1.0000 mg | ORAL_TABLET | Freq: Every day | ORAL | Status: DC
  Administered 2017-07-28 – 2017-07-29 (×2): 1 mg via ORAL
  Filled 2017-07-28 (×2): qty 2

## 2017-07-28 MED ORDER — MOMETASONE FURO-FORMOTEROL FUM 100-5 MCG/ACT IN AERO
2.0000 | INHALATION_SPRAY | Freq: Two times a day (BID) | RESPIRATORY_TRACT | Status: DC
  Administered 2017-07-28 – 2017-07-30 (×4): 2 via RESPIRATORY_TRACT
  Filled 2017-07-28 (×2): qty 8.8

## 2017-07-28 MED ORDER — ISOSORBIDE MONONITRATE ER 30 MG PO TB24: 60.0000 mg | ORAL_TABLET | Freq: Every day | ORAL | Status: DC

## 2017-07-28 NOTE — ED Provider Notes (Signed)
TIME SEEN: 1:44 AM  CHIEF COMPLAINT: Syncope  HPI: Patient is a 71 year old female with history of carotid artery disease, chronic kidney disease, COPD, CAD status post CABG, history of paroxysmal ventral tachycardia, symptomatic bradycardia status post pacemaker who presents to the emergency department with a syncopal event.  States she had an episode a month ago where she "came to lying on the bathroom floor".  On Monday she had a similar episode where she woke up on the bathroom floor and laid there for 8 hours before a neighbor came over and helped her get up.  She states that she initially was not going to tell anybody about these syncopal events but states that she started having left leg numbness and difficulty with her balance.  Unclear when that started.  Is complaining of left knee, ankle and foot pain from her fall.  She is on Plavix.  She lives at [facility] in the independent living.  She denies any other numbness or focal weakness.  No headache.  She has had shortness of breath recently but denies chest pain.  No fevers, cough, vomiting or diarrhea.  ROS: See HPI Constitutional: no fever  Eyes: no drainage  ENT: no runny nose   Cardiovascular:  no chest pain  Resp: SOB  GI: no vomiting GU: no dysuria Integumentary: no rash  Allergy: no hives  Musculoskeletal: no leg swelling  Neurological: no slurred speech ROS otherwise negative  PAST MEDICAL HISTORY/PAST SURGICAL HISTORY:  Past Medical History:  Diagnosis Date  . Anxiety   . Blood transfusion without reported diagnosis    10/29/2006  . Bulging disc   . Carotid arterial disease (HCC)    a. s/p L intracranial ICA stenting (Deveshwar).  . Cataract 05/2012  . Chronic kidney disease, stage III (moderate) (HCC)   . COPD (chronic obstructive pulmonary disease) ([place])    a. quit smoking in 2007.  Marland Kitchen Coronary artery disease    a. 2007 s/p MI and LAD stenting;  10/2006 CABG x 4: LIMA->LAD, VG->OM->dLCX, VG->RPL;  c. 07/2013  MV: no ischemia.  . Depression   . Esophageal reflux   . History of echocardiogram    a. 01/2012 Echo: EF 55%, mildly dil LA/RV, mild MR, Ao Sclerosis, mod TR.  Marland Kitchen Hypothyroid   . Insomnia   . Macular degeneration   . Mixed hyperlipidemia   . Osteoporosis   . PVT (paroxysmal ventricular tachycardia) ([place])   . Restless leg syndrome   . Stroke ([place])   . Symptomatic bradycardia    a. s/p BSX Advantio dual chamber PPM Beckie Salts).  . Syncope and collapse   . Unspecified hearing loss     MEDICATIONS:  Prior to Admission medications   Medication Sig Start Date End Date Taking? Authorizing Provider  albuterol (PROVENTIL HFA;VENTOLIN HFA) 108 (90 Base) MCG/ACT inhaler Inhale 1-2 puffs into the lungs every 6 (six) hours as needed for wheezing or shortness of breath. 04/19/17   Rigoberto Noel, MD  ALPRAZolam Duanne Moron) 1 MG tablet Take 1 tablet by mouth at bedtime. 01/06/17   [provider]  aspirin 81 MG tablet Take 81 mg by mouth daily.    [provider]  atorvastatin (LIPITOR) 20 MG tablet Take 20 mg by mouth daily.    [provider]  clopidogrel (PLAVIX) 75 MG tablet Take 75 mg by mouth daily.    [provider]  diphenhydrAMINE (BENADRYL) 25 MG tablet Take 50 mg by mouth at bedtime as needed for sleep.  [provider]  Ferrous Sulfate (IRON) 325 (65 Fe) MG TABS Take 1 tablet by mouth daily.    [provider]  folic acid (FOLVITE) 606 MCG tablet Take 800 mcg by mouth daily.    [provider]  furosemide (LASIX) 20 MG tablet Take 20 mg by mouth as needed.    [provider]  isosorbide mononitrate (IMDUR) 60 MG 24 hr tablet Take 60 mg by mouth daily.    [provider]  levothyroxine (SYNTHROID, LEVOTHROID) 75 MCG tablet TAKE 1 TABLET EVERY DAY  (NEW  DOSE) 07/01/17   Susy Frizzle, MD  Magnesium 250 MG TABS Take 1 tablet by mouth daily.    [provider]  Melatonin 10 MG TABS Take 20 mg by  mouth at bedtime.    [provider]  metoprolol tartrate (LOPRESSOR) 25 MG tablet Take 25 mg by mouth 2 (two) times daily.    [provider]  nitroGLYCERIN (NITROSTAT) 0.4 MG SL tablet Place 0.4 mg under the tongue every 5 (five) minutes as needed for chest pain.    [provider]  pantoprazole (PROTONIX) 40 MG tablet Take 40 mg by mouth daily.    [provider]  pramipexole (MIRAPEX) 1 MG tablet Take 2 mg by mouth daily.     [provider]  sertraline (ZOLOFT) 100 MG tablet Take 200 mg by mouth at bedtime.    [provider]  traZODone (DESYREL) 100 MG tablet Take 200 mg by mouth at bedtime.    [provider]    ALLERGIES:  Allergies  Allergen Reactions  . Codeine     swelling  . Iohexol      Desc: PT HAD CONTRAST REACTION YRS AGO NEEDS 13 HR PREP FOR ALL CONTRAST STUDIES     SOCIAL HISTORY:  Social History  Substance Use Topics  . Smoking status: Former Smoker    Packs/day: 0.75    Years: 40.00    Types: Cigarettes    Start date: 09/28/1965    Quit date: 05/27/2006  . Smokeless tobacco: Never Used  . Alcohol use No    FAMILY HISTORY: Family History  Problem Relation Age of Onset  . Stomach cancer Mother   . Diabetes Mother   . Hyperlipidemia Mother   . Hypertension Father   . Lung cancer Father   . Alcohol abuse Father   . Early death Brother     EXAM: BP 116/68 (BP Location: Left Arm)   Pulse 70   Temp 97.9 F (36.6 C) (Oral)   Resp 18   SpO2 96%  CONSTITUTIONAL: Alert and oriented x3 and responds appropriately to questions.  Elderly, obese HEAD: Normocephalic EYES: Conjunctivae clear, pupils appear equal, EOMI ENT: normal nose; moist mucous membranes NECK: Supple, no meningismus, no nuchal rigidity, no LAD  CARD: RRR; S1 and S2 appreciated; no murmurs, no clicks, no rubs, no gallops RESP: Normal chest excursion without splinting or tachypnea; breath sounds clear and equal bilaterally; no  wheezes, no rhonchi, no rales, no hypoxia or respiratory distress, speaking full sentences ABD/GI: Normal bowel sounds; non-distended; soft, non-tender, no rebound, no guarding, no peritoneal signs, no hepatosplenomegaly BACK:  The back appears normal and is non-tender to palpation, there is no CVA tenderness EXT: Patient is tender to palpation over the left knee, left ankle and left foot without obvious deformity, swelling or ecchymosis.  2+ DP pulses bilaterally.  Otherwise extremities are nontender to palpation.  Compartments are soft.  No joint  effusion noted. SKIN: Normal color for age and race; warm; no rash NEURO: Moves all extremities equally, reports diminished sensation in the left leg but otherwise normal sensation in the right leg, both arms and the face.  Normal speech.  Cranial nerves II through XII intact.  Unable to assess ataxia given patient's left leg pain. PSYCH: The patient's mood and manner are appropriate. Grooming and personal hygiene are appropriate.  MEDICAL DECISION MAKING: Patient here with 2 syncopal events.  She has multiple risk factors including history of ventricular tachycardia, symptomatic bradycardia with a pacemaker, CAD.  Will obtain cardiac labs.  We will add on a CK level given she states that she was on the floor for 8 hours.  We will obtain x-rays of the left leg, CT of the head and cervical spine.  Will give pain medication.  I feel she will need admission.  We will interrogate her pacemaker.  ED PROGRESS: Patient CK is normal.  Troponin negative.  CT of the head and cervical spine showed no acute abnormality.  X-rays of the left leg also showed no acute abnormality.  Labs show mild elevation of creatinine which is her baseline.  No anemia or electrolyte abnormality.  Will discuss with hospitalist for admission.  4:22 AM Discussed patient's case with hospitalist, Dr. Blaine Hamper.  I have recommended admission and patient (and family if present) agree with this plan.  Admitting physician will place admission orders.   I reviewed all nursing notes, vitals, pertinent previous records, EKGs, lab and urine results, imaging (as available).     EKG Interpretation  Date/Time:  Wednesday July 27 2017 18:00:07 EDT Ventricular Rate:  72 PR Interval:  196 QRS Duration: 100 QT Interval:  444 QTC Calculation: 486 R Axis:   120 Text Interpretation:  Electronic atrial pacemaker Right axis deviation Incomplete right bundle branch block Possible Right ventricular hypertrophy Abnormal ECG No significant change since last tracing Confirmed by Ward, Cyril Mourning [phone]) on 07/28/2017 1:09:09 AM          Ward, Delice Bison, DO 07/28/17 0422

## 2017-07-28 NOTE — ED Notes (Signed)
Attempted to interrogate pt pacemaker, but machine not working. [employer] and rep stated would be out to interrogate pacemaker

## 2017-07-28 NOTE — Progress Notes (Signed)
  Echocardiogram 2D Echocardiogram has been performed.  Jennette Dubin 07/28/2017, 3:33 PM

## 2017-07-28 NOTE — ED Notes (Signed)
Pt to go for EEG prior to going upstairs for admission.

## 2017-07-28 NOTE — Progress Notes (Signed)
This is a no charge note  Pending admission per Dr. Leonides Schanz  71 year old lady with past medical history of hyperlipidemia, stroke, GERD, hypothyroidism, depression, anxiety, CAD, PVT, s/p of pacemaker placement, CAD, CKD-3, bulging disc, who presents with syncope. Patient has poor balance, left leg pain and left leg numbness. CT of head and C-spine negative for acute issues. Patient has pacemaker, cannot to MRI. I ordered 2-D echo, carotid Doppler, orthostatic vital sign. Pacemaker interrogation will be done in ED. Patient is placed in telemetry bed for observation.   Ivor Costa, MD  [facility] Pager [phone]  If 7PM-7AM, please contact night-coverage [email or web address] Password [email or web address] 07/28/2017, 4:26 AM

## 2017-07-28 NOTE — Evaluation (Signed)
Physical Therapy Evaluation Patient Details Name: [name] MRN: [id number] DOB: 1946-05-02 Today's Date: 07/28/2017   History of Present Illness  Patient is a 71 y/o female who presents with recurrent syncopal episodes. Ankle xray-Soft tissue edema without acute fracture or dislocation. PMH includes CAD s/p CABG, stroke, PVD, macular degeneration, COPD, HLD, syncope, collape, anxiety.  Clinical Impression  Patient presents with generalized weakness, pain in foot, impaired balance, decreased safety awareness and impaired mobility s/p above. Tolerated gait training with Min A-min guard assist for balance/safety. Pt with poor safety awareness as demonstrated by pt pushing RW away at times but holding onto furniture for support. Pt lives alone from [place] and reports multiple falls. Would benefit from HHPT to maximize independence and mobility prior to return home and decrease fall risk. Will follow acutely.    Follow Up Recommendations Home health PT;Supervision for mobility/OOB    Equipment Recommendations  None recommended by PT    Recommendations for Other Services       Precautions / Restrictions Precautions Precautions: Fall Restrictions Weight Bearing Restrictions: No      Mobility  Bed Mobility Overal bed mobility: Needs Assistance Bed Mobility: Supine to Sit     Supine to sit: Supervision;HOB elevated     General bed mobility comments: Use of rail for support, increased time and effort.   Transfers Overall transfer level: Needs assistance Equipment used: Rolling walker (2 wheeled) Transfers: Sit to/from Stand Sit to Stand: Min assist         General transfer comment: Min A to boost to standing with cues for hand placement as pt pulls up on RW to stand. Stood from [employer], from toilet x1 using grab bar.  Ambulation/Gait Ambulation/Gait assistance: Min assist;Min guard Ambulation Distance (Feet): 15 Feet (+15' +12') Assistive device: Rolling walker (2  wheeled) Gait Pattern/deviations: Step-through pattern;Decreased stride length;Trunk flexed Gait velocity: decreased Gait velocity interpretation: <1.8 ft/sec, indicative of risk for recurrent falls General Gait Details: Slow, unsteady gait with pain through foot initially. Min A for balance at times esp with turns. Pushes RW to side at times despite cues. Poor safety awareness.   Stairs            Wheelchair Mobility    Modified Rankin (Stroke Patients Only)       Balance Overall balance assessment: Needs assistance;History of Falls Sitting-balance support: No upper extremity supported;Feet supported Sitting balance-Leahy Scale: Good Sitting balance - Comments: Able to reach outside BoS and donn socks with assist; also able to donn underwear.   Standing balance support: During functional activity;Single extremity supported Standing balance-Leahy Scale: Poor Standing balance comment: Requires at least UE support when pulling up underwear but able to wash hands at sink with Min guard assist.                              Pertinent Vitals/Pain Pain Assessment: Faces Faces Pain Scale: Hurts a little bit Pain Location: chest pressure Pain Descriptors / Indicators: Pressure Pain Intervention(s): Monitored during session;Repositioned;Limited activity within patient's tolerance    Home Living Family/patient expects to be discharged to:: Private residence (ILF) Living Arrangements: Alone Available Help at Discharge: Available PRN/intermittently Type of Home: Apartment Home Access: Level entry     Home Layout: One level Home Equipment: Environmental consultant - 4 wheels;Cane - single point      Prior Function Level of Independence: Independent with assistive device(s)         Comments:  uses rollator vs funriture walking PTA. Reports she is supposed to use her walker but does not always. Reports lots of falls. "i Stumble a lot and run into things." Goes to dining hall for  food.     Hand Dominance        Extremity/Trunk Assessment   Upper Extremity Assessment Upper Extremity Assessment: Defer to OT evaluation    Lower Extremity Assessment Lower Extremity Assessment: Generalized weakness       Communication   Communication: No difficulties  Cognition Arousal/Alertness: Awake/alert Behavior During Therapy: WFL for tasks assessed/performed Overall Cognitive Status: Within Functional Limits for tasks assessed                                 General Comments: except poor safety awareness.      General Comments General comments (skin integrity, edema, etc.): BP pre activity 149/68, post activity 132/72    Exercises     Assessment/Plan    PT Assessment Patient needs continued PT services  PT Problem List Decreased strength;Decreased mobility;Decreased safety awareness;Cardiopulmonary status limiting activity;Decreased balance;Decreased activity tolerance;Pain       PT Treatment Interventions Therapeutic activities;Gait training;Therapeutic exercise;Patient/family education;Balance training;Functional mobility training    PT Goals (Current goals can be found in the Care Plan section)  Acute Rehab PT Goals Patient Stated Goal: to go home PT Goal Formulation: With patient Time For Goal Achievement: 08/11/17 Potential to Achieve Goals: Good    Frequency Min 3X/week   Barriers to discharge Decreased caregiver support lives alone in ILF    Co-evaluation               AM-PAC PT "6 Clicks" Daily Activity  Outcome Measure Difficulty turning over in bed (including adjusting bedclothes, sheets and blankets)?: None Difficulty moving from lying on back to sitting on the side of the bed? : None Difficulty sitting down on and standing up from a chair with arms (e.g., wheelchair, bedside commode, etc,.)?: Unable Help needed moving to and from a bed to chair (including a wheelchair)?: A Little Help needed walking in hospital  room?: A Little Help needed climbing 3-5 steps with a railing? : A Lot 6 Click Score: 17    End of Session Equipment Utilized During Treatment: Gait belt Activity Tolerance: Patient tolerated treatment well Patient left: in bed;with call bell/phone within reach (sitting EOB>) Nurse Communication: Mobility status PT Visit Diagnosis: Unsteadiness on feet (R26.81);Muscle weakness (generalized) (M62.81);Other abnormalities of gait and mobility (R26.89)    Time: 1275-1700 PT Time Calculation (min) (ACUTE ONLY): 29 min   Charges:   PT Evaluation $PT Eval Low Complexity: 1 Low PT Treatments $Gait Training: 8-22 mins   PT G Codes:        Wray Kearns, PT, DPT [phone]    Marguarite Arbour A Amarie Tarte 07/28/2017, 1:28 PM

## 2017-07-28 NOTE — ED Notes (Signed)
Per hospitalist hold all meds at this time.

## 2017-07-28 NOTE — Evaluation (Signed)
Occupational Therapy Evaluation Patient Details Name: [name] MRN: [id number] DOB: 05-Jun-1946 Today's Date: 07/28/2017    History of Present Illness Patient is a 71 y/o female who presents with recurrent syncopal episodes. Ankle xray-Soft tissue edema without acute fracture or dislocation. PMH includes CAD s/p CABG, stroke, PVD, macular degeneration, COPD, HLD, syncope, collapse, anxiety.   Clinical Impression   Pt typically is modified independent in ambulation and self care. She receives assistance at her ILF for transportation, cleaning and 2 meals per day. Pt reports she also has medical alert necklace, but did not have it on at the time of her fall. Pt presents with generalized pain and weakness with poor standing balance and decreased safety awareness. Will follow acutely. Recommending HHOT.    Follow Up Recommendations  Home health OT    Equipment Recommendations  None recommended by OT    Recommendations for Other Services       Precautions / Restrictions Precautions Precautions: Fall Restrictions Weight Bearing Restrictions: No      Mobility Bed Mobility Overal bed mobility: Needs Assistance Bed Mobility: Supine to Sit;Sit to Supine     Supine to sit: Min assist Sit to supine: Min assist   General bed mobility comments: assist to raise trunk and for LEs back into bed, increased time, HOB up  Transfers Overall transfer level: Needs assistance Equipment used: 1 person hand held assist Transfers: Sit to/from [facility] Sit to Stand: Min assist Stand pivot transfers: Min assist       General transfer comment: assist to rise and steady    Balance Overall balance assessment: Needs assistance;History of Falls Sitting-balance support: No upper extremity supported;Feet supported Sitting balance-Leahy Scale: Good Sitting balance - Comments: Able to reach outside BoS and donn socks with assist; also able to donn underwear.   Standing  balance support: During functional activity;Single extremity supported Standing balance-Leahy Scale: Poor Standing balance comment: requires at least one hand support when managing panties                           ADL either performed or assessed with clinical judgement   ADL Overall ADL's : Needs assistance/impaired Eating/Feeding: Independent;Sitting   Grooming: Brushing hair;Sitting;Set up   Upper Body Bathing: Set up;Sitting   Lower Body Bathing: Minimal assistance;Sit to/from stand   Upper Body Dressing : Set up;Sitting   Lower Body Dressing: Maximal assistance;Bed level Lower Body Dressing Details (indicate cue type and reason): socks, does not typically wear Toilet Transfer: Minimal assistance;Stand-pivot;BSC   Toileting- Clothing Manipulation and Hygiene: Minimal assistance;Sit to/from stand               Vision Baseline Vision/History: No visual deficits Patient Visual Report: No change from baseline       Perception     Praxis      Pertinent Vitals/Pain Pain Assessment: Faces Faces Pain Scale: Hurts a little bit Pain Location: generalized Pain Descriptors / Indicators: Sore Pain Intervention(s): Monitored during session     Hand Dominance Right   Extremity/Trunk Assessment Upper Extremity Assessment Upper Extremity Assessment: Overall WFL for tasks assessed   Lower Extremity Assessment Lower Extremity Assessment: Defer to PT evaluation       Communication Communication Communication: HOH (wears hearing aid)   Cognition Arousal/Alertness: Awake/alert Behavior During Therapy: WFL for tasks assessed/performed Overall Cognitive Status: Impaired/Different from baseline Area of Impairment: Safety/judgement  Safety/Judgement: Decreased awareness of safety     General Comments: except poor safety awareness.   General Comments  BP pre activity 149/68, post activity 132/72    Exercises      Shoulder Instructions      Home Living Family/patient expects to be discharged to:: Private residence (ILF) Living Arrangements: Alone Available Help at Discharge: Available PRN/intermittently;Friend(s) Type of Home: Apartment Home Access: Level entry     Home Layout: One level     Bathroom Shower/Tub: Occupational psychologist: Standard     Home Equipment: Environmental consultant - 4 wheels;Cane - single point;Grab bars - toilet;Grab bars - tub/shower;Shower seat - built in   Additional Comments: does not like to wear socks, gets hots      Prior Functioning/Environment Level of Independence: Independent with assistive device(s)        Comments: goes to dining room for 2 meals, walks with rollator or holding furniture        OT Problem List: Decreased activity tolerance;Impaired balance (sitting and/or standing);Decreased knowledge of use of DME or AE;Pain;Decreased safety awareness      OT Treatment/Interventions: Self-care/ADL training;DME and/or AE instruction;Therapeutic activities;Patient/family education;Balance training    OT Goals(Current goals can be found in the care plan section) Acute Rehab OT Goals Patient Stated Goal: to go home OT Goal Formulation: With patient Time For Goal Achievement: 08/04/17 Potential to Achieve Goals: Good ADL Goals Pt Will Perform Grooming: with modified independence;standing Pt Will Perform Upper Body Bathing: with set-up;sitting Pt Will Perform Lower Body Bathing: with set-up;sit to/from stand Pt Will Perform Upper Body Dressing: with set-up;sitting Pt Will Perform Lower Body Dressing: with set-up;sit to/from stand Pt Will Transfer to Toilet: with modified independence;ambulating Pt Will Perform Toileting - Clothing Manipulation and hygiene: with modified independence;sit to/from stand Pt Will Perform Tub/Shower Transfer: Shower transfer;with supervision;ambulating;grab bars;rolling walker  OT Frequency: Min 2X/week   Barriers to  D/C:            Co-evaluation              AM-PAC PT "6 Clicks" Daily Activity     Outcome Measure Help from another person eating meals?: None Help from another person taking care of personal grooming?: A Little Help from another person toileting, which includes using toliet, bedpan, or urinal?: A Little Help from another person bathing (including washing, rinsing, drying)?: A Lot Help from another person to put on and taking off regular upper body clothing?: None Help from another person to put on and taking off regular lower body clothing?: A Lot 6 Click Score: 18   End of Session    Activity Tolerance: Patient tolerated treatment well Patient left: in bed;with call bell/phone within reach;with bed alarm set  OT Visit Diagnosis: Unsteadiness on feet (R26.81);Other abnormalities of gait and mobility (R26.89);Pain                Time: 4098-1191 OT Time Calculation (min): 28 min Charges:  OT General Charges $OT Visit: 1 Visit OT Evaluation $OT Eval Moderate Complexity: 1 Mod OT Treatments $Self Care/Home Management : 8-22 mins G-Codes: OT G-codes **NOT FOR INPATIENT CLASS** Functional Assessment Tool Used: Clinical judgement Functional Limitation: Self care Self Care Current Status (Y7829): At least 40 percent but less than 60 percent impaired, limited or restricted Self Care Goal Status (F6213): At least 1 percent but less than 20 percent impaired, limited or restricted   Malka So 07/28/2017, 4:18 PM  07/28/2017 Nestor Lewandowsky, OTR/L Pager: [phone]

## 2017-07-28 NOTE — ED Notes (Signed)
Pharmacy in room at this time.

## 2017-07-28 NOTE — ED Notes (Signed)
EEG called and wanted patient to come for eeg, will transport to inpatient room after study

## 2017-07-28 NOTE — Progress Notes (Signed)
Urinalysis consistent with UTI.  History of pansensitive E. coli UTI in July.  Empiric Rocephin initiated.  Will order urine culture.  Erin Hearing, ANP

## 2017-07-28 NOTE — Progress Notes (Signed)
EEG complete - results pending 

## 2017-07-28 NOTE — ED Notes (Signed)
Pt out to EEG test

## 2017-07-28 NOTE — ED Notes (Signed)
Per [employer] rep who has interrogated this pt's pacemaker.  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 Thresholds and lead measurements appropriate. Last stored episode for short PAT April 2018.  Battery with 4 years remaining. Underlying rhythm atrially paced with ventricular conduction.  No changes. Program [id number].

## 2017-07-28 NOTE — Procedures (Signed)
ELECTROENCEPHALOGRAM REPORT  Date of Study: 07/28/2017  Patient's Name: [name] MRN: [id number] Date of Birth: 1946/02/01  Referring Provider: Erin Hearing, NP  Clinical History: This is a 71 year old woman with syncope.  Medications: acetaminophen (TYLENOL) tablet 650 mg  albuterol (PROVENTIL) (2.5 MG/3ML) 0.083% nebulizer solution 3 mL  ALPRAZolam (XANAX) tablet 1 mg  aspirin chewable tablet 81 mg  atorvastatin (LIPITOR) tablet 20 mg  clopidogrel (PLAVIX) tablet 75 mg  diphenhydrAMINE (BENADRYL) capsule 50 mg  enoxaparin (LOVENOX) injection 40 mg  ferrous sulfate tablet 854 mg  folic acid (FOLVITE) tablet 1 mg  hydrALAZINE (APRESOLINE) injection 5 mg  levothyroxine (SYNTHROID, LEVOTHROID) tablet 75 mcg  mometasone-formoterol (DULERA) 100-5 MCG/ACT inhaler 2 puff  nitroGLYCERIN (NITROSTAT) SL tablet 0.4 mg  ondansetron (ZOFRAN) injection 4 mg  pantoprazole (PROTONIX) EC tablet 40 mg  pramipexole (MIRAPEX) tablet 2 mg  sertraline (ZOLOFT) tablet 100 mg  sodium chloride flush (NS) 0.9 % injection 3 mL  tiotropium (SPIRIVA) inhalation capsule 18 mcg  traZODone (DESYREL) tablet 100 mg  zolpidem (AMBIEN) tablet 5 mg   Technical Summary: A multichannel digital EEG recording measured by the international 10-20 system with electrodes applied with paste and impedances below 5000 ohms performed in our laboratory with EKG monitoring in an awake and asleep patient.  Hyperventilation was not performed. Photic stimulation was performed.  The digital EEG was referentially recorded, reformatted, and digitally filtered in a variety of bipolar and referential montages for optimal display.    Description: [name] awake and asleep during the recording.  During maximal wakefulness, there is a symmetric, medium voltage 8-8.5 Hz posterior dominant rhythm that attenuates with eye opening.  The record is symmetric.  During drowsiness and sleep, there is an increase in theta slowing  of the background.  Vertex waves and symmetric sleep spindles were seen.  Photic stimulation did not elicit any abnormalities.  There were no epileptiform discharges or electrographic seizures seen.    EKG lead was unremarkable.  Impression: This awake and asleep EEG is normal.    Clinical Correlation: A normal EEG does not exclude a clinical diagnosis of epilepsy. Clinical correlation is advised.   Ellouise Newer, M.D.

## 2017-07-28 NOTE — H&P (Signed)
History and Physical    [name] DXI:[id number] DOB: 11-13-1945 DOA: 07/28/2017   PCP: Clovia Cuff, MD   Attending physician: Marily Memos  Patient coming from/Resides with: Private residence/alone  Chief Complaint: Recurrent syncope  HPI: [name] is a 71 y.o. female with medical history significant for CAD with normal Myoview in 2014, remote history of CVA, COPD with underlying moderate RV dysfunction, grade 1 diastolic dysfunction, pulmonary hypertension with severe TR, hypothyroidism, stage III chronic kidney disease, hypothyroidism, mixed lipidemia, symptomatic bradycardia status post pacemaker, history of paroxysmal ventricular tachycardia, restless leg syndrome with insomnia.  Patient presents to the ER with reports of 2 syncopal episodes in the past month.  The last being 2 days prior to presentation.  She reported that she laid on the bathroom floor for at least 8 hours before awakening per her estimation although when I queried her regarding this she is not exactly sure exactly when the episode occurred.  She does recall that she remembers it being early in the morning noting daylight just beginning to occur outside of her windows.  She reports that when she awakened on the bathroom floor she had been incontinent of bowel and bladder and has no recollection otherwise of any events prior to that except as described above.  In addition to the above complaints she reported left knee pain with ambulation worse than her chronic knee pain with ambulation.  She also had shortness of breath and chest tightness and has been evaluated by her pulmonologist in July.  Patient also reports that she stopped taking all of her COPD medications except for her albuterol/Ventolin and recently got a refill on this because she had run out.  She reports she is supposed to utilize a cane and/or a walker for ambulation but typically does not do so.  She reports significant issues with insomnia  but also reports frustration over the amount of medication she takes at night to assist with her sleep.  ED Course:  Vital Signs: BP 106/67   Pulse 70   Temp 97.9 F (36.6 C) (Oral)   Resp 20   SpO2 91%  DG left knee, foot and ankle: no acute traumatic injuries; she does have soft tissue edema of the left ankle without fracture or dislocation CT head and cervical spine without contrast: No acute traumatic injuries; no acute intracranial hemorrhage Lab data: Sodium 139, potassium 3.8, chloride 104, CO2 27, glucose 101, BUN 12, creatinine 1.39, anion gap 8, CK 43, troponin <0.03, white count 7100, no differential obtained, hemoglobin 12.9, platelets 169,000, urinalysis pending collection Medications and treatments: Fentanyl 50 mcg IV x1, Zofran 4 mg IV x1  Review of Systems:  In addition to the HPI above,  No Fever-chills, myalgias or other constitutional symptoms No Headache, changes with Vision or hearing, new weakness, tingling, numbness in any extremity, dizziness, dysarthria or word finding difficulty, tremors or seizure activity although patient reports after syncopal episode was incontinent of bowel and bladder No problems swallowing food or Liquids, indigestion/reflux, choking or coughing while eating, abdominal pain with or after eating No Cough, palpitations, orthopnea or DOE No Abdominal pain, N/V, melena,hematochezia, dark tarry stools, constipation No dysuria, malodorous urine, hematuria or flank pain No new skin rashes, lesions, masses or bruises, No new joint pains, aches, swelling or redness No recent unintentional weight gain or loss No polyuria, polydypsia or polyphagia   Past Medical History:  Diagnosis Date  . Anxiety   . Blood transfusion without reported diagnosis  10/29/2006  . Bulging disc   . Carotid arterial disease (HCC)    a. s/p L intracranial ICA stenting ([place]).  . Cataract 05/2012  . Chronic kidney disease, stage III (moderate) (HCC)   . COPD  (chronic obstructive pulmonary disease) ([place])    a. quit smoking in 2007.  Marland Kitchen Coronary artery disease    a. 2007 s/p MI and LAD stenting;  10/2006 CABG x 4: LIMA->LAD, VG->OM->dLCX, VG->RPL;  c. 07/2013 MV: no ischemia.  . Depression   . Esophageal reflux   . History of echocardiogram    a. 01/2012 Echo: EF 55%, mildly dil LA/RV, mild MR, Ao Sclerosis, mod TR.  Marland Kitchen Hypothyroid   . Insomnia   . Macular degeneration   . Mixed hyperlipidemia   . Osteoporosis   . PVT (paroxysmal ventricular tachycardia) ([place])   . Restless leg syndrome   . Stroke ([place])   . Symptomatic bradycardia    a. s/p BSX Advantio dual chamber PPM Beckie Salts).  . Syncope and collapse   . Unspecified hearing loss     Past Surgical History:  Procedure Laterality Date  . CARDIAC CATHETERIZATION  2007   x2;[place]  . CARDIAC CATHETERIZATION  2008  . CATARACT EXTRACTION Right   . CEREBRAL ANGIOGRAM    . colonoscopy    . CORONARY ARTERY BYPASS GRAFT     x 4  . EYE SURGERY  05/2013   cataract removed  . GALLBLADDER SURGERY    . INSERT / REPLACE / REMOVE PACEMAKER  2013   [place], [place]; [employer]  . KNEE ARTHROSCOPY    . LUMBAR DISC SURGERY    . PARTIAL HYSTERECTOMY    . SPINE SURGERY  1999/2001/2006   3 disc replaced    Social History   Social History  . Marital status: Widowed    Spouse name: N/A  . Number of children: N/A  . Years of education: N/A   Occupational History  . retired     was ECG tech @ [employer]   Social History Main Topics  . Smoking status: Former Smoker    Packs/day: 0.75    Years: 40.00    Types: Cigarettes    Start date: 09/28/1965    Quit date: 05/27/2006  . Smokeless tobacco: Never Used  . Alcohol use No  . Drug use: No  . Sexual activity: Not Currently   Other Topics Concern  . Not on file   Social History Narrative   Lives in independent section of a retirement community.  Does not routinely exercise.    Mobility: Cane or rolling walker Work history: Not  obtained   Allergies  Allergen Reactions  . Codeine     swelling  . Iohexol      Desc: PT HAD CONTRAST REACTION YRS AGO NEEDS 13 HR PREP FOR ALL CONTRAST STUDIES     Family History  Problem Relation Age of Onset  . Stomach cancer Mother   . Diabetes Mother   . Hyperlipidemia Mother   . Hypertension Father   . Lung cancer Father   . Alcohol abuse Father   . Early death Brother      Prior to Admission medications   Medication Sig Start Date End Date Taking? Authorizing Provider  albuterol (PROVENTIL HFA;VENTOLIN HFA) 108 (90 Base) MCG/ACT inhaler Inhale 1-2 puffs into the lungs every 6 (six) hours as needed for wheezing or shortness of breath. 04/19/17   Rigoberto Noel, MD  ALPRAZolam Duanne Moron) 1 MG tablet Take  1 tablet by mouth at bedtime. 01/06/17   [provider]  aspirin 81 MG tablet Take 81 mg by mouth daily.    [provider]  atorvastatin (LIPITOR) 20 MG tablet Take 20 mg by mouth daily.    [provider]  clopidogrel (PLAVIX) 75 MG tablet Take 75 mg by mouth daily.    [provider]  diphenhydrAMINE (BENADRYL) 25 MG tablet Take 50 mg by mouth at bedtime as needed for sleep.     [provider]  Ferrous Sulfate (IRON) 325 (65 Fe) MG TABS Take 1 tablet by mouth daily.    [provider]  folic acid (FOLVITE) 884 MCG tablet Take 800 mcg by mouth daily.    [provider]  furosemide (LASIX) 20 MG tablet Take 20 mg by mouth as needed.    [provider]  isosorbide mononitrate (IMDUR) 60 MG 24 hr tablet Take 60 mg by mouth daily.    [provider]  levothyroxine (SYNTHROID, LEVOTHROID) 75 MCG tablet TAKE 1 TABLET EVERY DAY  (NEW  DOSE) 07/01/17   Susy Frizzle, MD  Magnesium 250 MG TABS Take 1 tablet by mouth daily.    [provider]  Melatonin 10 MG TABS Take 20 mg by mouth at bedtime.    [provider]  metoprolol tartrate (LOPRESSOR) 25 MG tablet Take 25 mg by mouth 2  (two) times daily.    [provider]  nitroGLYCERIN (NITROSTAT) 0.4 MG SL tablet Place 0.4 mg under the tongue every 5 (five) minutes as needed for chest pain.    [provider]  pantoprazole (PROTONIX) 40 MG tablet Take 40 mg by mouth daily.    [provider]  pramipexole (MIRAPEX) 1 MG tablet Take 2 mg by mouth daily.     [provider]  sertraline (ZOLOFT) 100 MG tablet Take 200 mg by mouth at bedtime.    [provider]  traZODone (DESYREL) 100 MG tablet Take 200 mg by mouth at bedtime.    [provider]    Physical Exam: Vitals:   07/28/17 0500 07/28/17 0530 07/28/17 0600 07/28/17 0630  BP: (!) 97/55 (!) 106/45 (!) 114/53 106/67  Pulse: 69 68 70 70  Resp: 17 19 18 20   Temp:      TempSrc:      SpO2: 90% 90% 92% 91%      Constitutional: NAD, calm, comfortable Eyes: PERRL, lids and conjunctivae normal ENMT: Mucous membranes are moist. Posterior pharynx clear of any exudate or lesions.Normal dentition.  Neck: normal, supple, no masses, no thyromegaly Respiratory: clear to auscultation bilaterally, no wheezing, no crackles. Normal respiratory effort. No accessory muscle use.  Cardiovascular: Regular rate and rhythm, no murmurs / rubs; + S4 gallop. No extremity edema. 2+ pedal pulses. No carotid bruits.  Abdomen: no tenderness, no masses palpated. No hepatosplenomegaly. Bowel sounds positive.  Musculoskeletal: no clubbing / cyanosis. No joint deformity upper and lower extremities.  Noted minor swelling left ankle.  Good ROM, no contractures. Normal muscle tone.  Skin: no rashes, lesions, ulcers. No induration Neurologic: CN 2-12 grossly intact. Sensation intact, DTR normal. Strength 5/5 x all 4 extremities.  Psychiatric: Normal judgment and insight. Alert and oriented x 3. Normal mood.    Labs on Admission: I have personally reviewed following labs and imaging studies  CBC:  Recent Labs Lab 07/27/17 1812  WBC 7.1  HGB  12.9  HCT 39.5  MCV 89.6  PLT 169   Basic  Metabolic Panel:  Recent Labs Lab 07/27/17 1812  NA 139  K 3.8  CL 104  CO2 27  GLUCOSE 101*  BUN 12  CREATININE 1.39*  CALCIUM 9.0   GFR: Estimated Creatinine Clearance: 38.9 mL/min (A) (by C-G formula based on SCr of 1.39 mg/dL (H)). Liver Function Tests: No results for input(s): AST, ALT, ALKPHOS, BILITOT, PROT, ALBUMIN in the last 168 hours. No results for input(s): LIPASE, AMYLASE in the last 168 hours. No results for input(s): AMMONIA in the last 168 hours. Coagulation Profile: No results for input(s): INR, PROTIME in the last 168 hours. Cardiac Enzymes:  Recent Labs Lab 07/28/17 0158  CKTOTAL 48  TROPONINI <0.03   BNP (last 3 results) No results for input(s): PROBNP in the last 8760 hours. HbA1C: No results for input(s): HGBA1C in the last 72 hours. CBG: No results for input(s): GLUCAP in the last 168 hours. Lipid Profile: No results for input(s): CHOL, HDL, LDLCALC, TRIG, CHOLHDL, LDLDIRECT in the last 72 hours. Thyroid Function Tests: No results for input(s): TSH, T4TOTAL, FREET4, T3FREE, THYROIDAB in the last 72 hours. Anemia Panel: No results for input(s): VITAMINB12, FOLATE, FERRITIN, TIBC, IRON, RETICCTPCT in the last 72 hours. Urine analysis:    Component Value Date/Time   COLORURINE YELLOW 04/14/2017 1559   APPEARANCEUR CLEAR 04/14/2017 1559   APPEARANCEUR Clear 01/24/2014 1550   LABSPEC 1.005 04/14/2017 1559   LABSPEC 1.016 01/24/2014 1550   PHURINE 7.5 04/14/2017 1559   GLUCOSEU NEGATIVE 04/14/2017 1559   GLUCOSEU Negative 01/24/2014 1550   HGBUR SMALL (A) 04/14/2017 1559   BILIRUBINUR NEGATIVE 04/14/2017 1559   BILIRUBINUR Negative 01/24/2014 1550   KETONESUR NEGATIVE 04/14/2017 1559   PROTEINUR NEGATIVE 04/14/2017 1559   NITRITE NEGATIVE 04/14/2017 1559   LEUKOCYTESUR LARGE (A) 04/14/2017 1559   LEUKOCYTESUR 2+ 01/24/2014 1550   Sepsis Labs: @LABRCNTIP (procalcitonin:4,lacticidven:4) )No  results found for this or any previous visit (from the past 240 hour(s)).   Radiological Exams on Admission: Dg Chest 2 View  Result Date: 07/28/2017 CLINICAL DATA:  Syncope with left knee injury EXAM: CHEST  2 VIEW COMPARISON:  Chest radiograph 07/18/2017 FINDINGS: Unchanged position of left chest wall pacemaker leads. Median sternotomy wires and CABG markers. Unchanged cardiomediastinal contours. No pleural effusion pneumothorax. No focal airspace consolidation or pulmonary edema. IMPRESSION: No active cardiopulmonary disease. Electronically Signed   By: Ulyses Jarred M.D.   On: 07/28/2017 01:47   Dg Ankle Complete Left  Result Date: 07/28/2017 CLINICAL DATA:  Left foot and ankle pain after syncope and fall. EXAM: LEFT ANKLE COMPLETE - 3+ VIEW COMPARISON:  None. FINDINGS: There is no evidence of fracture, dislocation, or joint effusion. Small Achilles tendon enthesophytes and moderate plantar calcaneal spur. Mild soft tissue edema about the ankle. Soft tissues are unremarkable. IMPRESSION: Soft tissue edema without acute fracture or dislocation. Electronically Signed   By: Jeb Levering M.D.   On: 07/28/2017 02:17   Ct Head Wo Contrast  Result Date: 07/28/2017 CLINICAL DATA:  72 year old female with possible fall. EXAM: CT HEAD WITHOUT CONTRAST CT CERVICAL SPINE WITHOUT CONTRAST TECHNIQUE: Multidetector CT imaging of the head and cervical spine was performed following the standard protocol without intravenous contrast. Multiplanar CT image reconstructions of the cervical spine were also generated. COMPARISON:  Head CT dated 07/18/2017 FINDINGS: CT HEAD FINDINGS Brain: There is mild age-related atrophy and chronic microvascular ischemic changes. There is no acute intracranial hemorrhage. No mass effect or midline shift noted. No extra-axial fluid collection. Vascular: No hyperdense vessel or unexpected  calcification. Left MCA stent as seen on the prior CT. Evaluation of the state is limited on this  noncontrast CT. Skull:  No acute calvarial pathology. Sinuses/Orbits: There is mucoperiosteal thickening of the right maxillary sinus. The remainder of the visualized paranasal sinuses and mastoid air cells are clear. Other: None CT CERVICAL SPINE FINDINGS Alignment: No acute subluxation. Skull base and vertebrae: No acute fracture. Advanced osteopenia which limits evaluation for fracture. Soft tissues and spinal canal: No prevertebral fluid or swelling. No visible canal hematoma. Disc levels: There is bony fusion of C4-C7. C6-C7 anterior fusion plate and screws noted. The fixation plate and screws appear intact. Upper chest: Centrilobular emphysema. Other: Partially visualized pacemaker wires in the left upper chest. IMPRESSION: 1. No acute intracranial hemorrhage. 2. Age-related atrophy and chronic microvascular ischemic changes. Left MCA stent appears in similar position as prior CT. 3. No acute/traumatic cervical spine pathology. 4. 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 Electronically Signed   By: Anner Crete M.D.   On: 07/28/2017 02:44   Ct Cervical Spine Wo Contrast  Result Date: 07/28/2017 CLINICAL DATA:  71 year old female with possible fall. EXAM: CT HEAD WITHOUT CONTRAST CT CERVICAL SPINE WITHOUT CONTRAST TECHNIQUE: Multidetector CT imaging of the head and cervical spine was performed following the standard protocol without intravenous contrast. Multiplanar CT image reconstructions of the cervical spine were also generated. COMPARISON:  Head CT dated 07/18/2017 FINDINGS: CT HEAD FINDINGS Brain: There is mild age-related atrophy and chronic microvascular ischemic changes. There is no acute intracranial hemorrhage. No mass effect or midline shift noted. No extra-axial fluid collection. Vascular: No hyperdense vessel or unexpected calcification. Left MCA stent as seen on the prior CT. Evaluation of the state is limited on this noncontrast CT. Skull:  No acute calvarial  pathology. Sinuses/Orbits: There is mucoperiosteal thickening of the right maxillary sinus. The remainder of the visualized paranasal sinuses and mastoid air cells are clear. Other: None CT CERVICAL SPINE FINDINGS Alignment: No acute subluxation. Skull base and vertebrae: No acute fracture. Advanced osteopenia which limits evaluation for fracture. Soft tissues and spinal canal: No prevertebral fluid or swelling. No visible canal hematoma. Disc levels: There is bony fusion of C4-C7. C6-C7 anterior fusion plate and screws noted. The fixation plate and screws appear intact. Upper chest: Centrilobular emphysema. Other: Partially visualized pacemaker wires in the left upper chest. IMPRESSION: 1. No acute intracranial hemorrhage. 2. Age-related atrophy and chronic microvascular ischemic changes. Left MCA stent appears in similar position as prior CT. 3. No acute/traumatic cervical spine pathology. 4. 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 Electronically Signed   By: Anner Crete M.D.   On: 07/28/2017 02:44   Dg Knee Complete 4 Views Left  Result Date: 07/28/2017 CLINICAL DATA:  71 year old female with syncope and left knee pain. EXAM: LEFT KNEE - COMPLETE 4+ VIEW COMPARISON:  None. FINDINGS: There is no acute fracture or dislocation. Mild osteopenia. There is mild degenerative changes. No significant joint effusion. The soft tissues appear unremarkable. IMPRESSION: No acute/ traumatic osseous pathology. Electronically Signed   By: Anner Crete M.D.   On: 07/28/2017 01:47   Dg Foot Complete Left  Result Date: 07/28/2017 CLINICAL DATA:  Left foot and ankle pain after syncope and fall. EXAM: LEFT FOOT - COMPLETE 3+ VIEW COMPARISON:  None. FINDINGS: There is no evidence of fracture or dislocation. Mild hammertoe deformities of the second through fifth digits. Mild osteoarthritis of the first metatarsal phalangeal joint. Os peroneal incidentally noted.  Plantar calcaneal spur and small  Achilles tendon enthesophyte. Soft tissues are unremarkable. IMPRESSION: No fracture or subluxation of the left foot. Electronically Signed   By: Jeb Levering M.D.   On: 07/28/2017 02:23    EKG: (Independently reviewed) atrial paced ventricular sensed rhythm with ventricular response of 72 bpm, QTC 486 ms, incomplete right bundle branch block  Assessment/Plan Principal Problem:   Syncope -Patient presents with 2 episodes of unwitnessed syncope in the past 30 days with apparent loss of consciousness and incontinence of bowel and bladder -Pacemaker interrogated and no arrhythmia detected -History of old CVA and given reported need to rule out seizure therefore check EEG-patient had elevated CK greater than 300 when presented to the ER on 10/23-CK normal -Frequent neurological checks -Repeat echocardiogram (last completed in July -see below) -No focal neurological deficits so doubt stroke/TIA -Suspect polypharmacy contributing -PT/OT evaluation; mobilize with assistance -OVS  Active Problems:   History of PVT (paroxysmal ventricular tachycardia)/Symptomatic bradycardia -Has pacemaker in place and interrogation negative for arrhythmia    Chronic kidney disease, stage III (moderate)  -Renal function stable and at baseline    COPD (chronic obstructive pulmonary disease) with Pulmonary hypertension/severe tricuspid regurgitation and moderate RV systolic dysfunction -Evaluated by pulmonologist and given underlying comorbidities and DNR status no further evaluation regarding pulmonary hypertension initiated -Patient also reports that she self discontinued her Spiriva and her Symbicort-discussed with her need to continue these medications since they are symptom preventative -Continue Ventolin -Apparently has chronic chest tightness and shortness of breath related to her COPD -No current hypoxemia -Current blood pressure suboptimal so holding Imdur    Chronic diastolic heart failure, NYHA  class 1  -Last echocardiogram July 2979: Grade 1 diastolic dysfunction, EF 89-21%, moderate RV systolic dysfunction, severe tricuspid regurgitation with pulmonary hypertension -Current blood pressure suboptimal so hold Lasix    Hypertension -Current pressure readings suboptimal -hold Imdur, Lopressor and hydralazine    Restless leg syndrome -Patient takes multiple medications to assist with RLS and associated insomnia -On very high dose trazodone prior to admission (200 mg)-I have decreased to 100 mg -Also on very high Zoloft dose (200 mg) prior to admission and I have decreased to 100 mg -Continue Ambien -Holding melatonin acutely -Anxiety so we will continue Xanax although may need dose reduction in this medication as well -Suspect polypharmacy regarding nocturnal medications contributing to patient's apparent early a.m. Symptoms     History of CVA -Continue aspirin, statin and Plavix    Coronary artery disease/prior CABG x4 -Has chronic chest tightness and shortness of breath -Myoview July 2018 without any ischemic changes/low risk study    Mixed hyperlipidemia -Continue statin    Hypothyroid -Continue Synthroid -TSH in July was 1.267      DVT prophylaxis: Lovenox Code Status: DNR Family Communication: No family at bedside Disposition Plan: Home Consults called: None    Mehtaab Mayeda L. ANP-BC [facility] Pager [phone]   If 7PM-7AM, please contact night-coverage [email or web address] Password [email or web address]  07/28/2017, 8:03 AM

## 2017-07-29 ENCOUNTER — Encounter (HOSPITAL_COMMUNITY): Payer: Self-pay | Admitting: General Practice

## 2017-07-29 ENCOUNTER — Observation Stay (HOSPITAL_BASED_OUTPATIENT_CLINIC_OR_DEPARTMENT_OTHER): Payer: Medicare HMO

## 2017-07-29 DIAGNOSIS — I5032 Chronic diastolic (congestive) heart failure: Secondary | ICD-10-CM

## 2017-07-29 DIAGNOSIS — N183 Chronic kidney disease, stage 3 (moderate): Secondary | ICD-10-CM

## 2017-07-29 DIAGNOSIS — R55 Syncope and collapse: Secondary | ICD-10-CM | POA: Diagnosis not present

## 2017-07-29 LAB — COMPREHENSIVE METABOLIC PANEL
ALBUMIN: 3.6 g/dL (ref 3.5–5.0)
ALT: 18 U/L (ref 14–54)
ANION GAP: 9 (ref 5–15)
AST: 17 U/L (ref 15–41)
Alkaline Phosphatase: 120 U/L (ref 38–126)
BILIRUBIN TOTAL: 0.5 mg/dL (ref 0.3–1.2)
BUN: 15 mg/dL (ref 6–20)
CO2: 26 mmol/L (ref 22–32)
Calcium: 8.8 mg/dL — ABNORMAL LOW (ref 8.9–10.3)
Chloride: 102 mmol/L (ref 101–111)
Creatinine, Ser: 1.5 mg/dL — ABNORMAL HIGH (ref 0.44–1.00)
GFR calc Af Amer: 39 mL/min — ABNORMAL LOW (ref >60)
GFR calc non Af Amer: 34 mL/min — ABNORMAL LOW (ref >60)
GLUCOSE: 92 mg/dL (ref 65–99)
POTASSIUM: 3.9 mmol/L (ref 3.5–5.1)
SODIUM: 137 mmol/L (ref 135–145)
TOTAL PROTEIN: 6.5 g/dL (ref 6.5–8.1)

## 2017-07-29 LAB — CBC
HEMATOCRIT: 39.4 % (ref 36.0–46.0)
HEMOGLOBIN: 12.8 g/dL (ref 12.0–15.0)
MCH: 29 pg (ref 26.0–34.0)
MCHC: 32.5 g/dL (ref 30.0–36.0)
MCV: 89.1 fL (ref 78.0–100.0)
Platelets: 148 10*3/uL — ABNORMAL LOW (ref 150–400)
RBC: 4.42 MIL/uL (ref 3.87–5.11)
RDW: 13.5 % (ref 11.5–15.5)
WBC: 5.5 10*3/uL (ref 4.0–10.5)

## 2017-07-29 LAB — GLUCOSE, CAPILLARY: Glucose-Capillary: 91 mg/dL (ref 65–99)

## 2017-07-29 LAB — URINE CULTURE

## 2017-07-29 MED ORDER — METOPROLOL TARTRATE 25 MG PO TABS
25.0000 mg | ORAL_TABLET | Freq: Two times a day (BID) | ORAL | Status: DC
  Administered 2017-07-29 – 2017-07-30 (×2): 25 mg via ORAL
  Filled 2017-07-29 (×3): qty 1

## 2017-07-29 NOTE — Progress Notes (Addendum)
Carotid duplex 1-39% ICA stenosis.  Landry Mellow, RDMS, RVT

## 2017-07-29 NOTE — Care Management Obs Status (Signed)
[place] NOTIFICATION   Patient Details  Name: ASHTIN MELICHAR MRN: [id number] Date of Birth: 1946/09/02   Medicare Observation Status Notification Given:  Yes    Arley Phenix, RN 07/29/2017, 2:29 PM

## 2017-07-29 NOTE — Care Management Note (Addendum)
Case Management Note  Patient Details  Name: ANNISTON NELLUMS MRN: [id number] Date of Birth: 1946/01/02  Subjective/Objective:     Pt presented for fall, syncope, and UTI.  Pt from [facility] where she lives indepedently.   Pt uses rollator and has canes, but does not use any other equipment.  Pt has used [facility] in the past for [facility] services at the [place].    Expected d/c tomorrow.  Pt has a friend to transport her home.   Action/Plan: Georgina Snell with [facility] contacted to start [facility] services.  SOC to begin within 24-48 hours of discharge.   Pt denies need of any other equipment.  Expected Discharge Date:     07/30/17             Expected Discharge Plan:  [place]  In-House Referral:  NA  Discharge planning Services  CM Consult  Post Acute Care Choice:  NA Choice offered to:  NA  DME Arranged:  N/A DME Agency:  NA  HH Arranged:  PT, OT HH Agency:  [facility]  Status of Service:  Completed, signed off  If discussed at [place] of Stay Meetings, dates discussed:    Additional Comments:  Arley Phenix, RN 07/29/2017, 12:52 PM

## 2017-07-29 NOTE — Plan of Care (Signed)
Problem: Safety: Goal: Ability to remain free from injury will improve Outcome: Progressing Pt has bed alarm set on at all times

## 2017-07-29 NOTE — Progress Notes (Signed)
PROGRESS NOTE    RAELEE ROSSMANN  ONG:[id number] DOB: September 19, 1946 DOA: 07/28/2017 PCP: Clovia Cuff, MD  Brief Narrative:[name] is a 71 y.o. female with medical history significant for CAD with normal Myoview in 2014, remote history of CVA, COPD with underlying moderate RV dysfunction, grade 1 diastolic dysfunction, pulmonary hypertension with severe TR, hypothyroidism, stage III chronic kidney disease, hypothyroidism, mixed lipidemia, symptomatic bradycardia status post pacemaker, history of paroxysmal ventricular tachycardia, restless leg syndrome with insomnia.  Patient presents to the ER with reports of 2 syncopal episodes in the past month.  The last being 2 days prior to presentation. Workup in the emergency room unremarkable except for abnormal urinalysis   Assessment & Plan:    Syncope -Patient presented with 2 episodes of unwitnessed syncope in the past 30 days with apparent loss of consciousness and incontinence of bowel and bladder -Pacemaker interrogated and no arrhythmia detected -Orthostatics negative but blood pressure ranging from 98 to 110s mostly -CT head without acute findings, EEG negative for epileptiform activity -2-D echocardiogram notable for tricuspid regurgitation, severe unchanged from prior -I suspect her syncopal events could likely be related to hypotension, patient reports that her blood pressure often runs in the low 100 range on Imdur, hydralazine and metoprolol, suspect she is preload dependent due to severe TR and in the setting of Imdur and hydralazine could have worsening hypotension periodically causing syncopal events -Resumed metoprolol, holding Imdur and hydralazine at this time -No events on telemetry -Physical therapy evaluation today  Suspected UTI -UA positive with nitrite and leukocyte esterase -Continue IV ceftriaxone today, follow-up urine cultures    History of PVT (paroxysmal ventricular tachycardia)/Symptomatic  bradycardia -Has pacemaker in place and interrogation negative for arrhythmia    Chronic kidney disease, stage III (moderate)  -Renal function stable and at baseline    COPD (chronic obstructive pulmonary disease) with Pulmonary hypertension/severe tricuspid regurgitation and moderate RV systolic dysfunction -Evaluated by pulmonologist and given underlying comorbidities and DNR status no further evaluation regarding pulmonary hypertension initiated -Stable, Continue Ventolin -Apparently has chronic chest tightness and shortness of breath related to her COPD    Chronic diastolic heart failure, NYHA class 1  -Last echocardiogram July 4401: Grade 1 diastolic dysfunction, EF 02-72%, moderate RV systolic dysfunction, severe tricuspid regurgitation with pulmonary hypertension -Current blood pressure soft so hold Lasix    Hypertension -Blood pressures in the 110s, resume Lopressor  -hold Imdur, and hydralazine    Restless leg syndrome -Patient takes multiple medications to assist with RLS and associated insomnia -On very high dose trazodone prior to admission (200 mg), decreased to 100 mg -Also on very high Zoloft dose (200 mg) prior to admission, decreased to 100 mg -Continue Ambien -continue xanax, stopped benadryl -Suspect polypharmacy could be contributing to falls although likely not syncope     History of CVA -Continue aspirin, statin and Plavix    Coronary artery disease/prior CABG x4 -Has chronic chest tightness and shortness of breath -Myoview July 2018 without any ischemic changes/low risk study -Stable    Mixed hyperlipidemia -Continue statin    Hypothyroid -Continue Synthroid -TSH in July was 1.267   DVT prophylaxis: Lovenox Code Status: DNR Family Communication: No family at bedside Disposition Plan: Home Tomorrow pending urine cultures   Procedures:   Antimicrobials:  Ceftriaxone 11/1  Subjective: -Feels okay, only got a few hours of sleep last  night, denies any chest pain shortness of breath or dizziness  Objective: Vitals:   07/29/17 0748 07/29/17 0750 07/29/17 1010 07/29/17  1300  BP:   136/83   Pulse:   78   Resp:    20  Temp:    98.3 F (36.8 C)  TempSrc:    Oral  SpO2: 93% 93%  96%  Weight:        Intake/Output Summary (Last 24 hours) at 07/29/17 1358 Last data filed at 07/29/17 1300  Gross per 24 hour  Intake              770 ml  Output                0 ml  Net              770 ml   Filed Weights   07/29/17 0321  Weight: 93.4 kg (206 lb)    Examination:  General exam: Appears calm and comfortable  Respiratory system: Clear to auscultation. Respiratory effort normal. Cardiovascular system: S1 & S2 heard, RRR. estolic murmur noted, rubs, gallops  Gastrointestinal system: Abdomen is nondistended, soft and nontender.Normal bowel sounds heard. Central nervous system: Alert and oriented. No focal neurological deficits. Extremities: Symmetric 5 x 5 power. Skin: No rashes, lesions or ulcers Psychiatry: Judgement and insight appear normal. Mood & affect appropriate.     Data Reviewed:   CBC:  Recent Labs Lab 07/27/17 1812 07/29/17 0542  WBC 7.1 5.5  HGB 12.9 12.8  HCT 39.5 39.4  MCV 89.6 89.1  PLT 169 353*   Basic Metabolic Panel:  Recent Labs Lab 07/27/17 1812 07/29/17 0542  NA 139 137  K 3.8 3.9  CL 104 102  CO2 27 26  GLUCOSE 101* 92  BUN 12 15  CREATININE 1.39* 1.50*  CALCIUM 9.0 8.8*   GFR: Estimated Creatinine Clearance: 36.6 mL/min (A) (by C-G formula based on SCr of 1.5 mg/dL (H)). Liver Function Tests:  Recent Labs Lab 07/29/17 0542  AST 17  ALT 18  ALKPHOS 120  BILITOT 0.5  PROT 6.5  ALBUMIN 3.6   No results for input(s): LIPASE, AMYLASE in the last 168 hours. No results for input(s): AMMONIA in the last 168 hours. Coagulation Profile: No results for input(s): INR, PROTIME in the last 168 hours. Cardiac Enzymes:  Recent Labs Lab 07/28/17 0158 07/28/17 0855  07/28/17 1538 07/28/17 1950  CKTOTAL 43  --   --   --   TROPONINI <0.03 <0.03 <0.03 <0.03   BNP (last 3 results) No results for input(s): PROBNP in the last 8760 hours. HbA1C: No results for input(s): HGBA1C in the last 72 hours. CBG:  Recent Labs Lab 07/29/17 0637  GLUCAP 91   Lipid Profile: No results for input(s): CHOL, HDL, LDLCALC, TRIG, CHOLHDL, LDLDIRECT in the last 72 hours. Thyroid Function Tests: No results for input(s): TSH, T4TOTAL, FREET4, T3FREE, THYROIDAB in the last 72 hours. Anemia Panel: No results for input(s): VITAMINB12, FOLATE, FERRITIN, TIBC, IRON, RETICCTPCT in the last 72 hours. Urine analysis:    Component Value Date/Time   COLORURINE YELLOW 07/28/2017 [place] 07/28/2017 0733   APPEARANCEUR Clear 01/24/2014 1550   LABSPEC 1.010 07/28/2017 0733   LABSPEC 1.016 01/24/2014 1550   PHURINE 6.0 07/28/2017 0733   GLUCOSEU NEGATIVE 07/28/2017 0733   GLUCOSEU Negative 01/24/2014 1550   HGBUR MODERATE (A) 07/28/2017 [place] NEGATIVE 07/28/2017 0733   BILIRUBINUR Negative 01/24/2014 [place] 07/28/2017 0733   PROTEINUR NEGATIVE 07/28/2017 0733   NITRITE POSITIVE (A) 07/28/2017 0733   LEUKOCYTESUR LARGE (A) 07/28/2017 2992  LEUKOCYTESUR 2+ 01/24/2014 1550   Sepsis Labs: @LABRCNTIP (procalcitonin:4,lacticidven:4)  ) Recent Results (from the past 240 hour(s))  Culture, Urine     Status: Abnormal   Collection Time: 07/28/17  2:21 PM  Result Value Ref Range Status   Specimen Description URINE, RANDOM  Final   Special Requests NONE  Final   Culture MULTIPLE SPECIES PRESENT, SUGGEST RECOLLECTION (A)  Final   Report Status 07/29/2017 FINAL  Final         Radiology Studies: Dg Chest 2 View  Result Date: 07/28/2017 CLINICAL DATA:  Syncope with left knee injury EXAM: CHEST  2 VIEW COMPARISON:  Chest radiograph 07/18/2017 FINDINGS: Unchanged position of left chest wall pacemaker leads. Median sternotomy  wires and CABG markers. Unchanged cardiomediastinal contours. No pleural effusion pneumothorax. No focal airspace consolidation or pulmonary edema. IMPRESSION: No active cardiopulmonary disease. Electronically Signed   By: Ulyses Jarred M.D.   On: 07/28/2017 01:47   Dg Ankle Complete Left  Result Date: 07/28/2017 CLINICAL DATA:  Left foot and ankle pain after syncope and fall. EXAM: LEFT ANKLE COMPLETE - 3+ VIEW COMPARISON:  None. FINDINGS: There is no evidence of fracture, dislocation, or joint effusion. Small Achilles tendon enthesophytes and moderate plantar calcaneal spur. Mild soft tissue edema about the ankle. Soft tissues are unremarkable. IMPRESSION: Soft tissue edema without acute fracture or dislocation. Electronically Signed   By: Jeb Levering M.D.   On: 07/28/2017 02:17   Ct Head Wo Contrast  Result Date: 07/28/2017 CLINICAL DATA:  71 year old female with possible fall. EXAM: CT HEAD WITHOUT CONTRAST CT CERVICAL SPINE WITHOUT CONTRAST TECHNIQUE: Multidetector CT imaging of the head and cervical spine was performed following the standard protocol without intravenous contrast. Multiplanar CT image reconstructions of the cervical spine were also generated. COMPARISON:  Head CT dated 07/18/2017 FINDINGS: CT HEAD FINDINGS Brain: There is mild age-related atrophy and chronic microvascular ischemic changes. There is no acute intracranial hemorrhage. No mass effect or midline shift noted. No extra-axial fluid collection. Vascular: No hyperdense vessel or unexpected calcification. Left MCA stent as seen on the prior CT. Evaluation of the state is limited on this noncontrast CT. Skull:  No acute calvarial pathology. Sinuses/Orbits: There is mucoperiosteal thickening of the right maxillary sinus. The remainder of the visualized paranasal sinuses and mastoid air cells are clear. Other: None CT CERVICAL SPINE FINDINGS Alignment: No acute subluxation. Skull base and vertebrae: No acute fracture. Advanced  osteopenia which limits evaluation for fracture. Soft tissues and spinal canal: No prevertebral fluid or swelling. No visible canal hematoma. Disc levels: There is bony fusion of C4-C7. C6-C7 anterior fusion plate and screws noted. The fixation plate and screws appear intact. Upper chest: Centrilobular emphysema. Other: Partially visualized pacemaker wires in the left upper chest. IMPRESSION: 1. No acute intracranial hemorrhage. 2. Age-related atrophy and chronic microvascular ischemic changes. Left MCA stent appears in similar position as prior CT. 3. No acute/traumatic cervical spine pathology. 4. 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 Electronically Signed   By: Anner Crete M.D.   On: 07/28/2017 02:44   Ct Cervical Spine Wo Contrast  Result Date: 07/28/2017 CLINICAL DATA:  71 year old female with possible fall. EXAM: CT HEAD WITHOUT CONTRAST CT CERVICAL SPINE WITHOUT CONTRAST TECHNIQUE: Multidetector CT imaging of the head and cervical spine was performed following the standard protocol without intravenous contrast. Multiplanar CT image reconstructions of the cervical spine were also generated. COMPARISON:  Head CT dated 07/18/2017 FINDINGS: CT HEAD FINDINGS Brain: There is mild  age-related atrophy and chronic microvascular ischemic changes. There is no acute intracranial hemorrhage. No mass effect or midline shift noted. No extra-axial fluid collection. Vascular: No hyperdense vessel or unexpected calcification. Left MCA stent as seen on the prior CT. Evaluation of the state is limited on this noncontrast CT. Skull:  No acute calvarial pathology. Sinuses/Orbits: There is mucoperiosteal thickening of the right maxillary sinus. The remainder of the visualized paranasal sinuses and mastoid air cells are clear. Other: None CT CERVICAL SPINE FINDINGS Alignment: No acute subluxation. Skull base and vertebrae: No acute fracture. Advanced osteopenia which limits evaluation for fracture.  Soft tissues and spinal canal: No prevertebral fluid or swelling. No visible canal hematoma. Disc levels: There is bony fusion of C4-C7. C6-C7 anterior fusion plate and screws noted. The fixation plate and screws appear intact. Upper chest: Centrilobular emphysema. Other: Partially visualized pacemaker wires in the left upper chest. IMPRESSION: 1. No acute intracranial hemorrhage. 2. Age-related atrophy and chronic microvascular ischemic changes. Left MCA stent appears in similar position as prior CT. 3. No acute/traumatic cervical spine pathology. 4. 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 Electronically Signed   By: Anner Crete M.D.   On: 07/28/2017 02:44   Dg Knee Complete 4 Views Left  Result Date: 07/28/2017 CLINICAL DATA:  71 year old female with syncope and left knee pain. EXAM: LEFT KNEE - COMPLETE 4+ VIEW COMPARISON:  None. FINDINGS: There is no acute fracture or dislocation. Mild osteopenia. There is mild degenerative changes. No significant joint effusion. The soft tissues appear unremarkable. IMPRESSION: No acute/ traumatic osseous pathology. Electronically Signed   By: Anner Crete M.D.   On: 07/28/2017 01:47   Dg Foot Complete Left  Result Date: 07/28/2017 CLINICAL DATA:  Left foot and ankle pain after syncope and fall. EXAM: LEFT FOOT - COMPLETE 3+ VIEW COMPARISON:  None. FINDINGS: There is no evidence of fracture or dislocation. Mild hammertoe deformities of the second through fifth digits. Mild osteoarthritis of the first metatarsal phalangeal joint. Os peroneal incidentally noted. Plantar calcaneal spur and small Achilles tendon enthesophyte. Soft tissues are unremarkable. IMPRESSION: No fracture or subluxation of the left foot. Electronically Signed   By: Jeb Levering M.D.   On: 07/28/2017 02:23        Scheduled Meds: . ALPRAZolam  1 mg Oral QHS  . aspirin  81 mg Oral Daily  . atorvastatin  20 mg Oral q1800  . clopidogrel  75 mg Oral Daily   . enoxaparin (LOVENOX) injection  40 mg Subcutaneous Q24H  . ferrous sulfate  325 mg Oral Q breakfast  . folic acid  6,195 mcg Oral Daily  . levothyroxine  75 mcg Oral QAC breakfast  . metoprolol tartrate  25 mg Oral BID  . mometasone-formoterol  2 puff Inhalation BID  . pantoprazole  40 mg Oral Daily  . pramipexole  2 mg Oral Daily  . sertraline  100 mg Oral QHS  . sodium chloride flush  3 mL Intravenous Q12H  . tiotropium  18 mcg Inhalation Daily  . traZODone  100 mg Oral QHS   Continuous Infusions: . cefTRIAXone (ROCEPHIN)  IV Stopped (07/28/17 1454)     LOS: 0 days    Time spent: 73min    Domenic Polite, MD [facility] Page via [email or web address], password [email or web address] After 7PM please contact night-coverage  07/29/2017, 1:58 PM

## 2017-07-29 NOTE — Progress Notes (Signed)
Occupational Therapy Treatment Patient Details Name: [name] MRN: [id number] DOB: Jul 18, 1946 Today's Date: 07/29/2017    History of present illness Patient is a 71 y/o female who presents with recurrent syncopal episodes. Ankle xray-Soft tissue edema without acute fracture or dislocation. PMH includes CAD s/p CABG, stroke, PVD, macular degeneration, COPD, HLD, syncope, collape, anxiety.   OT comments  Pt educated at length in home safety and fall prevention. Pt continues to be a fall risk, but without overt LOB with ADL today. Pt pleased to be able to sit up in the chair at end of session.  Follow Up Recommendations  Home health OT    Equipment Recommendations  None recommended by OT    Recommendations for Other Services      Precautions / Restrictions Precautions Precautions: Fall Restrictions Weight Bearing Restrictions: No       Mobility Bed Mobility Overal bed mobility: Modified Independent    General bed mobility comments: no assist, but increased time and use of rail  Transfers Overall transfer level: Needs assistance Equipment used: Rolling walker (2 wheeled) Transfers: Sit to/from Stand Sit to Stand: Supervision         General transfer comment: supervision for safety from bed and toilet    Balance Overall balance assessment: Needs assistance;History of Falls Sitting-balance support: No upper extremity supported;Feet supported Sitting balance-Leahy Scale: Good     Standing balance support: During functional activity Standing balance-Leahy Scale: Fair Standing balance comment: Able to stand at sink and wash hands without UE support                            ADL either performed or assessed with clinical judgement   ADL Overall ADL's : Needs assistance/impaired     Grooming: Brushing hair;Wash/dry hands;Standing;Supervision/safety           Upper Body Dressing : Set up;Sitting       Toilet Transfer: RW;Ambulation;Min  guard   Toileting- Clothing Manipulation and Hygiene: Supervision/safety;Sit to/from stand       Functional mobility during ADLs: Rolling walker;Min guard General ADL Comments: Pt educated on the importance of walker use at all times and home safety     Vision       Perception     Praxis      Cognition Arousal/Alertness: Awake/alert Behavior During Therapy: WFL for tasks assessed/performed Overall Cognitive Status: Impaired/Different from baseline Area of Impairment: Safety/judgement;Memory                     Memory: Decreased short-term memory   Safety/Judgement: Decreased awareness of safety              Exercises     Shoulder Instructions       General Comments VSS.    Pertinent Vitals/ Pain       Pain Assessment: No/denies pain Faces Pain Scale: Hurts a little bit Pain Location: left knee Pain Descriptors / Indicators: Sore Pain Intervention(s): Monitored during session  Home Living                                          Prior Functioning/Environment              Frequency  Min 2X/week        Progress Toward Goals  OT Goals(current goals can now be found  in the care plan section)  Progress towards OT goals: Progressing toward goals  Acute Rehab OT Goals Patient Stated Goal: to go home OT Goal Formulation: With patient Time For Goal Achievement: 08/04/17 Potential to Achieve Goals: Good  Plan Discharge plan remains appropriate    Co-evaluation                 AM-PAC PT "6 Clicks" Daily Activity     Outcome Measure   Help from another person eating meals?: None Help from another person taking care of personal grooming?: A Little Help from another person toileting, which includes using toliet, bedpan, or urinal?: A Little Help from another person bathing (including washing, rinsing, drying)?: A Lot Help from another person to put on and taking off regular upper body clothing?: None Help from  another person to put on and taking off regular lower body clothing?: A Lot 6 Click Score: 18    End of Session Equipment Utilized During Treatment: Gait belt;Rolling walker  OT Visit Diagnosis: Unsteadiness on feet (R26.81);Other abnormalities of gait and mobility (R26.89);Pain   Activity Tolerance Patient tolerated treatment well   Patient Left in chair;with call bell/phone within reach;with chair alarm set   Nurse Communication          Time: 7793-9030 OT Time Calculation (min): 27 min  Charges: OT General Charges $OT Visit: 1 Visit OT Treatments $Self Care/Home Management : 23-37 mins  07/29/2017 [name], OTR/L Pager: [phone] [name] 07/29/2017, 12:49 PM

## 2017-07-29 NOTE — Progress Notes (Signed)
Physical Therapy Treatment Patient Details Name: [name] MRN: [id number] DOB: [birth year] Today's Date: 07/29/2017    History of Present Illness Patient is a 71 y/o female who presents with recurrent syncopal episodes. Ankle xray-Soft tissue edema without acute fracture or dislocation. PMH includes CAD s/p CABG, stroke, PVD, macular degeneration, COPD, HLD, syncope, collape, anxiety.    PT Comments    Patient progressing well towards PT goals. Improved ambulation distance today but required use of RW and Min A at times for balance/safety. Pt continues to have poor safety awareness as she pushes RW away from her during within room ambulation despite cues. Pt with 2/4 DOE and continues to report knee soreness. Will follow to improve endurance, balance and safety awareness.    Follow Up Recommendations  Home health PT;Supervision for mobility/OOB     Equipment Recommendations  None recommended by PT    Recommendations for Other Services       Precautions / Restrictions Precautions Precautions: Fall Restrictions Weight Bearing Restrictions: No    Mobility  Bed Mobility Overal bed mobility: Needs Assistance Bed Mobility: Supine to Sit     Supine to sit: Min assist;HOB elevated Sit to supine: Min assist   General bed mobility comments: assist to raise trunk and for LEs back into bed, increased time, HOB up  Transfers Overall transfer level: Needs assistance Equipment used: Rolling walker (2 wheeled);None Transfers: Sit to/from Stand Sit to Stand: Min guard         General transfer comment: Min guard to steady in standing. Stood from [employer], from toilet x1. Cues for hand placement/technique.  Ambulation/Gait Ambulation/Gait assistance: Min guard;Min assist Ambulation Distance (Feet): 150 Feet Assistive device: Rolling walker (2 wheeled) Gait Pattern/deviations: Step-through pattern;Decreased stride length;Trunk flexed Gait velocity: decreased   General Gait  Details: Slow, unsteady gait. Min A for balance at times esp with turns as pt pushing RW out of way within room. Poor safety awareness.    Stairs            Wheelchair Mobility    Modified Rankin (Stroke Patients Only)       Balance Overall balance assessment: Needs assistance;History of Falls Sitting-balance support: No upper extremity supported;Feet supported Sitting balance-Leahy Scale: Good     Standing balance support: During functional activity Standing balance-Leahy Scale: Fair Standing balance comment: Able to stand at sink and wash hands without UE support but Min gaurd for safety.                             Cognition Arousal/Alertness: Awake/alert Behavior During Therapy: WFL for tasks assessed/performed Overall Cognitive Status: Impaired/Different from baseline Area of Impairment: Safety/judgement                         Safety/Judgement: Decreased awareness of safety            Exercises      General Comments General comments (skin integrity, edema, etc.): VSS.      Pertinent Vitals/Pain Pain Assessment: Faces Faces Pain Scale: Hurts a little bit Pain Location: left knee Pain Descriptors / Indicators: Sore Pain Intervention(s): Monitored during session    Home Living                      Prior Function            PT Goals (current goals can now be found in the  care plan section) Progress towards PT goals: Progressing toward goals    Frequency    Min 3X/week      PT Plan Current plan remains appropriate    Co-evaluation              AM-PAC PT "6 Clicks" Daily Activity  Outcome Measure  Difficulty turning over in bed (including adjusting bedclothes, sheets and blankets)?: None Difficulty moving from lying on back to sitting on the side of the bed? : Unable Difficulty sitting down on and standing up from a chair with arms (e.g., wheelchair, bedside commode, etc,.)?: None Help needed moving  to and from a bed to chair (including a wheelchair)?: A Little Help needed walking in hospital room?: A Little Help needed climbing 3-5 steps with a railing? : A Lot 6 Click Score: 17    End of Session Equipment Utilized During Treatment: Gait belt Activity Tolerance: Patient tolerated treatment well Patient left: in bed;with call bell/phone within reach;with bed alarm set Nurse Communication: Mobility status PT Visit Diagnosis: Unsteadiness on feet (R26.81);Muscle weakness (generalized) (M62.81);Other abnormalities of gait and mobility (R26.89)     Time: 0459-9774 PT Time Calculation (min) (ACUTE ONLY): 18 min  Charges:  $Therapeutic Exercise: 8-22 mins                    G Codes:       Wray Kearns, PT, DPT [phone]     Marguarite Arbour A Brandom Kerwin 07/29/2017, 11:54 AM

## 2017-07-29 NOTE — Progress Notes (Signed)
PT eval addendum- added G-codes    2017-07-31 1330  PT G-Codes **NOT FOR INPATIENT CLASS**  Functional Assessment Tool Used Clinical judgement  Functional Limitation Mobility: Walking and moving around  Mobility: Walking and Moving Around Current Status (E2336) CJ  Mobility: Walking and Moving Around Goal Status (P2244) CI    Wray Kearns, PT, DPT [phone]

## 2017-07-30 DIAGNOSIS — R55 Syncope and collapse: Secondary | ICD-10-CM | POA: Diagnosis not present

## 2017-07-30 DIAGNOSIS — I5032 Chronic diastolic (congestive) heart failure: Secondary | ICD-10-CM | POA: Diagnosis not present

## 2017-07-30 DIAGNOSIS — N183 Chronic kidney disease, stage 3 (moderate): Secondary | ICD-10-CM | POA: Diagnosis not present

## 2017-07-30 LAB — GLUCOSE, CAPILLARY
GLUCOSE-CAPILLARY: 84 mg/dL (ref 65–99)
Glucose-Capillary: 90 mg/dL (ref 65–99)

## 2017-07-30 MED ORDER — CEPHALEXIN 250 MG PO CAPS: 250.0000 mg | ORAL_CAPSULE | Freq: Four times a day (QID) | ORAL | 0 refills | Status: DC

## 2017-07-30 MED ORDER — TRAZODONE HCL 100 MG PO TABS: 100.0000 mg | ORAL_TABLET | Freq: Every day | ORAL | Status: DC

## 2017-07-30 MED ORDER — ISOSORBIDE MONONITRATE ER 30 MG PO TB24: 15.0000 mg | ORAL_TABLET | Freq: Every day | ORAL | 0 refills | Status: DC

## 2017-07-30 MED ORDER — SERTRALINE HCL 100 MG PO TABS: 100.0000 mg | ORAL_TABLET | Freq: Every day | ORAL | Status: DC

## 2017-07-30 NOTE — Progress Notes (Signed)
Pt d/c home to [facility].  In no acute distress.  All d/c paperwork and prescriptions given to pt and reviewed in detail with the pt.  She understands all instructions and she will fill her Rx at the pharmacy.  All belongings taken with pt.

## 2017-07-30 NOTE — Discharge Summary (Signed)
Physician Discharge Summary  [name] VFI:[id number] DOB: 1946/07/10 DOA: 07/28/2017  PCP: [name] Cuff, MD  Admit date: 07/28/2017 Discharge date: 07/30/2017  Time spent: 79minutes  Recommendations for Outpatient Follow-up:  1. PCP in 1 week, cut down Imdur dose, also cut down trazodone and sertraline dosing   Discharge Diagnoses:  Principal Problem:   Syncope Active Problems:   Chronic kidney disease, stage III (moderate) (HCC)   COPD (chronic obstructive pulmonary disease) (HCC)   Pulmonary hypertension (HCC)   Chronic diastolic heart failure, NYHA class 1 (HCC)   Coronary artery disease   Mixed hyperlipidemia   Hypothyroid   History of PVT (paroxysmal ventricular tachycardia) (HCC)   Symptomatic bradycardia   Restless leg syndrome   Discharge Condition: Stable  Diet recommendation: Heart healthy  Filed Weights   07/29/17 0321 07/30/17 0241  Weight: 93.4 kg (206 lb) 92.4 kg (203 lb 11.2 oz)    History of present illness:  [name] Peralta Johnsonis a 72 y.o.femalewith medical history significant for CAD with normal Myoview in 2014, remote history of CVA, COPD with underlying moderate RV dysfunction, grade 1 diastolic dysfunction, pulmonary hypertension with severe TR, hypothyroidism, stage III chronic kidney disease, hypothyroidism, mixed lipidemia, symptomatic bradycardia status post pacemaker, history of paroxysmal ventricular tachycardia, restless leg syndrome with insomnia. Patient presents to the ER with reports of 2 syncopal episodes in the past month. The last being 2 days prior to presentation  Hospital Course:  Syncope -Patient presented with 2 episodes of unwitnessed syncope in the past 30 days with apparent loss of consciousness and incontinence of bowel and bladder -Pacemaker interrogated and no arrhythmia detected -Orthostatics negative but blood pressure ranging from 98 to 110s mostly -CT head without acute findings, EEG negative for epileptiform  activity -2-D echocardiogram notable for tricuspid regurgitation, severe unchanged from prior -I suspect her syncopal events could likely be related to hypotension, patient reports that her blood pressure often runs in the low 100 range on Imdur, and metoprolol, suspect she is preload dependent due to severe TR and in the setting of Imdur  could have worsening hypotension periodically causing syncopal events -Resumed metoprolol, stopped hydralazine and cut down Imdur to 15 mg twice a day -No events on telemetry -Physical therapy evaluation completed and home health PT recommended this was set up at discharge -Advised to follow-up with cardiology and PCP  Suspected UTI -UA was abnormal and noted positive nitrite and leukocyte esterase -Treated with IV ceftriaxone, urine cultures were polymicrobial discharged home on 2 more days of Keflex  History of PVT (paroxysmal ventricular tachycardia)/Symptomatic bradycardia -Has pacemaker in place and interrogation negative for arrhythmia  Chronic kidney disease, stage III (moderate)  -Renal function stable and at baseline  COPD (chronic obstructive pulmonary disease) with Pulmonary hypertension/severe tricuspid regurgitation and moderate RV systolic dysfunction -Evaluated by pulmonologist and given underlying comorbidities and DNR status no further evaluation regarding pulmonary hypertension initiated -Stable, Continue Ventolin  Chronic diastolic heart failure, NYHA class 1  -Last echocardiogram July 4166: Grade 1 diastolic dysfunction, EF 06-30%, moderate RV systolic dysfunction, severe tricuspid regurgitation with pulmonary hypertension -Restarted low-dose by mouth Lasix  Hypertension -Blood pressures in the 110s, resume Lopressor  -stopped hydralazine and cut down Imdur dose  Restless leg syndrome -Patient takes multiple medications to assist with RLS and associated insomnia -On very high dose trazodone prior to admission  (200 mg), decreased to 100 mg -Also on very high Zoloft dose (200 mg)prior to admission, decreased to 100 mg -continue xanax, stopped benadryl -Suspect  polypharmacy could be contributing to falls although likely not syncope  History of CVA -Continue aspirin, statin and Plavix  Coronary artery disease/prior CABG x4 -Has chronic chest tightness and shortness of breath -Myoview July 2018 without any ischemic changes/low risk study -Stable  Mixed hyperlipidemia -Continue statin  Hypothyroid -Continue Synthroid -TSH in July was 1.267  Discharge Exam: Vitals:   07/30/17 0917 07/30/17 0923  BP:  (!) 136/58  Pulse:  75  Resp:    Temp:  (!) 97.5 F (36.4 C)  SpO2: 95% 97%    General: Alert awake oriented 3 Cardiovascular: S1-S2 regular rate rhythm Respiratory: Clear auscultation bilaterally  Discharge Instructions   Discharge Instructions    Diet - low sodium heart healthy    Complete by:  As directed    Increase activity slowly    Complete by:  As directed      Discharge Medication List as of 07/30/2017 11:01 AM    START taking these medications   Details  cephALEXin (KEFLEX) 250 MG capsule Take 1 capsule (250 mg total) by mouth 4 (four) times daily. For 2days, Starting Sat 07/30/2017, Print      CONTINUE these medications which have CHANGED   Details  isosorbide mononitrate (IMDUR) 30 MG 24 hr tablet Take 0.5 tablets (15 mg total) by mouth daily., Starting Sat 07/30/2017, Print    sertraline (ZOLOFT) 100 MG tablet Take 1 tablet (100 mg total) by mouth at bedtime., Starting Sat 07/30/2017, No Print    traZODone (DESYREL) 100 MG tablet Take 1 tablet (100 mg total) by mouth at bedtime., Starting Sat 07/30/2017, No Print      CONTINUE these medications which have NOT CHANGED   Details  albuterol (PROVENTIL HFA;VENTOLIN HFA) 108 (90 Base) MCG/ACT inhaler Inhale 1-2 puffs into the lungs every 6 (six) hours as needed for wheezing or shortness of breath.,  Starting Tue 04/19/2017, Normal    ALPRAZolam (XANAX) 1 MG tablet Take 1 tablet by mouth at bedtime., Starting Thu 01/06/2017, Historical Med    aspirin 81 MG tablet Take 81 mg by mouth daily., Historical Med    clopidogrel (PLAVIX) 75 MG tablet Take 75 mg by mouth daily., Historical Med    furosemide (LASIX) 20 MG tablet Take 20 mg by mouth as needed., Historical Med    levothyroxine (SYNTHROID, LEVOTHROID) 75 MCG tablet TAKE 1 TABLET EVERY DAY  (NEW  DOSE), Normal    metoprolol tartrate (LOPRESSOR) 25 MG tablet Take 25 mg by mouth 2 (two) times daily., Historical Med    pantoprazole (PROTONIX) 40 MG tablet Take 40 mg by mouth daily., Historical Med    pramipexole (MIRAPEX) 1 MG tablet Take 1.5 mg by mouth daily. , Historical Med    atorvastatin (LIPITOR) 20 MG tablet Take 20 mg by mouth daily., Historical Med    Ferrous Sulfate (IRON) 325 (65 Fe) MG TABS Take 1 tablet by mouth daily., Historical Med    folic acid (FOLVITE) 086 MCG tablet Take 800 mcg by mouth daily., Historical Med    Magnesium 250 MG TABS Take 1 tablet by mouth daily., Historical Med    Melatonin 10 MG TABS Take 20 mg by mouth at bedtime., Historical Med    nitroGLYCERIN (NITROSTAT) 0.4 MG SL tablet Place 0.4 mg under the tongue every 5 (five) minutes as needed for chest pain., Historical Med      STOP taking these medications     diphenhydrAMINE (BENADRYL) 25 MG tablet        Allergies  Allergen Reactions  . Codeine     swelling  . Iohexol      Desc: PT HAD CONTRAST REACTION YRS AGO NEEDS 13 HR PREP FOR ALL CONTRAST STUDIES    Follow-up Information    [facility] Follow up.   Specialty:  Home Health Services Why:  Physical therapist and Occupational Therapist will contact you within 24-48 hours of discharge.   Contact information: [place] [id number] [phone]        [name] Cuff, MD. Schedule an appointment as soon as possible for a visit in 1  week(s).   Specialty:  Internal Medicine Contact information: [address] [place] [address] [phone]            The results of significant diagnostics from this hospitalization (including imaging, microbiology, ancillary and laboratory) are listed below for reference.    Significant Diagnostic Studies: Dg Chest 2 View  Result Date: 07/28/2017 CLINICAL DATA:  Syncope with left knee injury EXAM: CHEST  2 VIEW COMPARISON:  Chest radiograph 07/18/2017 FINDINGS: Unchanged position of left chest wall pacemaker leads. Median sternotomy wires and CABG markers. Unchanged cardiomediastinal contours. No pleural effusion pneumothorax. No focal airspace consolidation or pulmonary edema. IMPRESSION: No active cardiopulmonary disease. Electronically Signed   By: Ulyses Jarred M.D.   On: 07/28/2017 01:47   Dg Chest 2 View  Result Date: 07/18/2017 CLINICAL DATA:  Patient states that she fell today but doesn't remember falling, has bruising to right breast area and right arm, hx of PVT, COPD, chronic kidney disease, no other complaints EXAM: CHEST  2 VIEW COMPARISON:  Chest x-ray dated 04/14/2017. FINDINGS: Mild cardiomegaly is stable. Overall cardiomediastinal silhouette is stable. Atherosclerotic changes noted at the aortic arch. Left chest wall pacemaker/ICD apparatus appears stable. Lungs are clear. No pleural effusion or pneumothorax seen. No acute or suspicious osseous finding. IMPRESSION: No acute findings. No evidence of pneumonia or pulmonary edema. No osseous fracture or dislocation seen. Aortic atherosclerosis. Stable mild cardiomegaly. Electronically Signed   By: Franki Cabot M.D.   On: 07/18/2017 16:07   Dg Ankle Complete Left  Result Date: 07/28/2017 CLINICAL DATA:  Left foot and ankle pain after syncope and fall. EXAM: LEFT ANKLE COMPLETE - 3+ VIEW COMPARISON:  None. FINDINGS: There is no evidence of fracture, dislocation, or joint effusion. Small Achilles  tendon enthesophytes and moderate plantar calcaneal spur. Mild soft tissue edema about the ankle. Soft tissues are unremarkable. IMPRESSION: Soft tissue edema without acute fracture or dislocation. Electronically Signed   By: Jeb Levering M.D.   On: 07/28/2017 02:17   Ct Head Wo Contrast  Result Date: 07/28/2017 CLINICAL DATA:  71 year old female with possible fall. EXAM: CT HEAD WITHOUT CONTRAST CT CERVICAL SPINE WITHOUT CONTRAST TECHNIQUE: Multidetector CT imaging of the head and cervical spine was performed following the standard protocol without intravenous contrast. Multiplanar CT image reconstructions of the cervical spine were also generated. COMPARISON:  Head CT dated 07/18/2017 FINDINGS: CT HEAD FINDINGS Brain: There is mild age-related atrophy and chronic microvascular ischemic changes. There is no acute intracranial hemorrhage. No mass effect or midline shift noted. No extra-axial fluid collection. Vascular: No hyperdense vessel or unexpected calcification. Left MCA stent as seen on the prior CT. Evaluation of the state is limited on this noncontrast CT. Skull:  No acute calvarial pathology. Sinuses/Orbits: There is mucoperiosteal thickening of the right maxillary sinus. The remainder of the visualized paranasal sinuses and mastoid air cells are clear.  Other: None CT CERVICAL SPINE FINDINGS Alignment: No acute subluxation. Skull base and vertebrae: No acute fracture. Advanced osteopenia which limits evaluation for fracture. Soft tissues and spinal canal: No prevertebral fluid or swelling. No visible canal hematoma. Disc levels: There is bony fusion of C4-C7. C6-C7 anterior fusion plate and screws noted. The fixation plate and screws appear intact. Upper chest: Centrilobular emphysema. Other: Partially visualized pacemaker wires in the left upper chest. IMPRESSION: 1. No acute intracranial hemorrhage. 2. Age-related atrophy and chronic microvascular ischemic changes. Left MCA stent appears in  similar position as prior CT. 3. No acute/traumatic cervical spine pathology. 4. 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 Electronically Signed   By: Anner Crete M.D.   On: 07/28/2017 02:44   Ct Head Wo Contrast  Result Date: 07/18/2017 CLINICAL DATA:  Fall today, multiple falls recently. EXAM: CT HEAD WITHOUT CONTRAST TECHNIQUE: Contiguous axial images were obtained from the base of the skull through the vertex without intravenous contrast. COMPARISON:  Head CT dated 04/14/2017. FINDINGS: Brain: Mild generalize age related parenchymal atrophy with commensurate dilatation of the ventricles and sulci. No mass, hemorrhage, edema or other evidence of acute parenchymal abnormality. No extra-axial hemorrhage. Vascular: Left middle cerebral artery stent appears stable in position. There are chronic calcified atherosclerotic changes of the large vessels at the skull base. No unexpected hyperdense vessel. Skull: Normal. Negative for fracture or focal lesion. Sinuses/Orbits: Mild mucosal thickening within the right maxillary sinus. Remainder of the visualized upper paranasal sinuses are clear. Mastoid air cells are clear. Periorbital and retro-orbital soft tissues are unremarkable. Other: None. IMPRESSION: 1. No acute findings. No intracranial mass, hemorrhage or edema. No skull fracture. 2. Left MCA stent appears stable in position. Electronically Signed   By: Franki Cabot M.D.   On: 07/18/2017 16:12   Ct Cervical Spine Wo Contrast  Result Date: 07/28/2017 CLINICAL DATA:  71 year old female with possible fall. EXAM: CT HEAD WITHOUT CONTRAST CT CERVICAL SPINE WITHOUT CONTRAST TECHNIQUE: Multidetector CT imaging of the head and cervical spine was performed following the standard protocol without intravenous contrast. Multiplanar CT image reconstructions of the cervical spine were also generated. COMPARISON:  Head CT dated 07/18/2017 FINDINGS: CT HEAD FINDINGS Brain: There is mild  age-related atrophy and chronic microvascular ischemic changes. There is no acute intracranial hemorrhage. No mass effect or midline shift noted. No extra-axial fluid collection. Vascular: No hyperdense vessel or unexpected calcification. Left MCA stent as seen on the prior CT. Evaluation of the state is limited on this noncontrast CT. Skull:  No acute calvarial pathology. Sinuses/Orbits: There is mucoperiosteal thickening of the right maxillary sinus. The remainder of the visualized paranasal sinuses and mastoid air cells are clear. Other: None CT CERVICAL SPINE FINDINGS Alignment: No acute subluxation. Skull base and vertebrae: No acute fracture. Advanced osteopenia which limits evaluation for fracture. Soft tissues and spinal canal: No prevertebral fluid or swelling. No visible canal hematoma. Disc levels: There is bony fusion of C4-C7. C6-C7 anterior fusion plate and screws noted. The fixation plate and screws appear intact. Upper chest: Centrilobular emphysema. Other: Partially visualized pacemaker wires in the left upper chest. IMPRESSION: 1. No acute intracranial hemorrhage. 2. Age-related atrophy and chronic microvascular ischemic changes. Left MCA stent appears in similar position as prior CT. 3. No acute/traumatic cervical spine pathology. 4. 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 Electronically Signed   By: Anner Crete M.D.   On: 07/28/2017 02:44   Dg Knee Complete 4 Views  Left  Result Date: 07/28/2017 CLINICAL DATA:  71 year old female with syncope and left knee pain. EXAM: LEFT KNEE - COMPLETE 4+ VIEW COMPARISON:  None. FINDINGS: There is no acute fracture or dislocation. Mild osteopenia. There is mild degenerative changes. No significant joint effusion. The soft tissues appear unremarkable. IMPRESSION: No acute/ traumatic osseous pathology. Electronically Signed   By: Anner Crete M.D.   On: 07/28/2017 01:47   Dg Foot Complete Left  Result Date:  07/28/2017 CLINICAL DATA:  Left foot and ankle pain after syncope and fall. EXAM: LEFT FOOT - COMPLETE 3+ VIEW COMPARISON:  None. FINDINGS: There is no evidence of fracture or dislocation. Mild hammertoe deformities of the second through fifth digits. Mild osteoarthritis of the first metatarsal phalangeal joint. Os peroneal incidentally noted. Plantar calcaneal spur and small Achilles tendon enthesophyte. Soft tissues are unremarkable. IMPRESSION: No fracture or subluxation of the left foot. Electronically Signed   By: Jeb Levering M.D.   On: 07/28/2017 02:23    Microbiology: Recent Results (from the past 240 hour(s))  Culture, Urine     Status: Abnormal   Collection Time: 07/28/17  2:21 PM  Result Value Ref Range Status   Specimen Description URINE, RANDOM  Final   Special Requests NONE  Final   Culture MULTIPLE SPECIES PRESENT, SUGGEST RECOLLECTION (A)  Final   Report Status 07/29/2017 FINAL  Final     Labs: Basic Metabolic Panel:  Recent Labs Lab 07/27/17 1812 07/29/17 0542  NA 139 137  K 3.8 3.9  CL 104 102  CO2 27 26  GLUCOSE 101* 92  BUN 12 15  CREATININE 1.39* 1.50*  CALCIUM 9.0 8.8*   Liver Function Tests:  Recent Labs Lab 07/29/17 0542  AST 17  ALT 18  ALKPHOS 120  BILITOT 0.5  PROT 6.5  ALBUMIN 3.6   No results for input(s): LIPASE, AMYLASE in the last 168 hours. No results for input(s): AMMONIA in the last 168 hours. CBC:  Recent Labs Lab 07/27/17 1812 07/29/17 0542  WBC 7.1 5.5  HGB 12.9 12.8  HCT 39.5 39.4  MCV 89.6 89.1  PLT 169 148*   Cardiac Enzymes:  Recent Labs Lab 07/28/17 0158 07/28/17 0855 07/28/17 1538 07/28/17 1950  CKTOTAL 43  --   --   --   TROPONINI <0.03 <0.03 <0.03 <0.03   BNP: BNP (last 3 results)  Recent Labs  04/14/17 1421  BNP 137.2*    ProBNP (last 3 results) No results for input(s): PROBNP in the last 8760 hours.  CBG:  Recent Labs Lab 07/29/17 0637 07/30/17 0454 07/30/17 0755  GLUCAP 91 84 90        Signed:  Rigdon Macomber MD.  [facility] 07/30/2017, 1:46 PM

## 2017-08-01 ENCOUNTER — Ambulatory Visit (INDEPENDENT_AMBULATORY_CARE_PROVIDER_SITE_OTHER): Payer: Medicare HMO | Admitting: *Deleted

## 2017-08-01 DIAGNOSIS — I495 Sick sinus syndrome: Secondary | ICD-10-CM | POA: Diagnosis not present

## 2017-08-01 NOTE — Progress Notes (Signed)
Remote pacemaker transmission.   

## 2017-08-03 ENCOUNTER — Encounter: Payer: Self-pay | Admitting: Cardiology

## 2017-08-12 LAB — CUP PACEART REMOTE DEVICE CHECK
Battery Remaining Longevity: 48 mo
Battery Remaining Percentage: 75 %
Brady Statistic RV Percent Paced: 15 %
Implantable Lead Implant Date: 20130611
Implantable Lead Location: 753859
Implantable Lead Model: 5076
Lead Channel Impedance Value: 437 Ohm
Lead Channel Impedance Value: 619 Ohm
Lead Channel Pacing Threshold Pulse Width: 0.4 ms
Lead Channel Setting Pacing Amplitude: 2 V
Lead Channel Setting Pacing Amplitude: 2.4 V
Lead Channel Setting Pacing Pulse Width: 0.4 ms
Lead Channel Setting Sensing Sensitivity: 2.5 mV
MDC IDC LEAD IMPLANT DT: 20130611
MDC IDC LEAD LOCATION: 753860
MDC IDC LEAD SERIAL: 566580
MDC IDC MSMT LEADCHNL RA PACING THRESHOLD AMPLITUDE: 0.4 V
MDC IDC PG IMPLANT DT: 20130611
MDC IDC SESS DTM: 20181105213100
MDC IDC STAT BRADY RA PERCENT PACED: 98 %
Pulse Gen Serial Number: 131914

## 2017-09-13 ENCOUNTER — Ambulatory Visit: Payer: Medicare HMO | Admitting: Internal Medicine

## 2017-09-13 ENCOUNTER — Encounter: Payer: Self-pay | Admitting: Internal Medicine

## 2017-09-13 VITALS — BP 120/72 | HR 71 | Ht 62.0 in | Wt 206.4 lb

## 2017-09-13 DIAGNOSIS — Z45018 Encounter for adjustment and management of other part of cardiac pacemaker: Secondary | ICD-10-CM | POA: Diagnosis not present

## 2017-09-13 DIAGNOSIS — I495 Sick sinus syndrome: Secondary | ICD-10-CM | POA: Diagnosis not present

## 2017-09-13 DIAGNOSIS — I2510 Atherosclerotic heart disease of native coronary artery without angina pectoris: Secondary | ICD-10-CM | POA: Diagnosis not present

## 2017-09-13 DIAGNOSIS — I10 Essential (primary) hypertension: Secondary | ICD-10-CM | POA: Diagnosis not present

## 2017-09-13 DIAGNOSIS — Z951 Presence of aortocoronary bypass graft: Secondary | ICD-10-CM | POA: Diagnosis not present

## 2017-09-13 NOTE — Patient Instructions (Signed)

## 2017-09-13 NOTE — Progress Notes (Signed)
HPI [name] returns today for ongoing evaluation and management of symptomatic sinus node dysfunction status post pacemaker insertion.  She is a pleasant  71 year old woman with a history of coronary artery disease status post bypass surgery 10 years ago, dyslipidemia, and severe macular degeneration requiring her to live in an assisted living center.  In the interim, she is continued to gradually gain weight, and has become more sedentary.  She has had a total of 7 operations on her knees and has severe arthritis.  She denies chest pain or shortness of breath.  She is limited by her mobility. Allergies  Allergen Reactions  . Codeine     swelling  . Iohexol      Desc: PT HAD CONTRAST REACTION YRS AGO NEEDS 13 HR PREP FOR ALL CONTRAST STUDIES      Current Outpatient Medications  Medication Sig Dispense Refill  . albuterol (PROVENTIL HFA;VENTOLIN HFA) 108 (90 Base) MCG/ACT inhaler Inhale 1-2 puffs into the lungs every 6 (six) hours as needed for wheezing or shortness of breath. 1 Inhaler 5  . ALPRAZolam (XANAX) 1 MG tablet Take 1 tablet by mouth at bedtime.  1  . aspirin 81 MG tablet Take 81 mg by mouth daily.    Marland Kitchen atorvastatin (LIPITOR) 20 MG tablet Take 20 mg by mouth daily.    . clopidogrel (PLAVIX) 75 MG tablet Take 75 mg by mouth daily.    . Ferrous Sulfate (IRON) 325 (65 Fe) MG TABS Take 1 tablet by mouth daily.    . folic acid (FOLVITE) 1 MG tablet Take 1 mg by mouth daily.    . furosemide (LASIX) 20 MG tablet Take 20 mg by mouth daily as needed (swelling).     . isosorbide mononitrate (IMDUR) 30 MG 24 hr tablet Take 0.5 tablets (15 mg total) by mouth daily. 30 tablet 0  . levothyroxine (SYNTHROID, LEVOTHROID) 75 MCG tablet TAKE 1 TABLET EVERY DAY  (NEW  DOSE) 90 tablet 2  . Magnesium 250 MG TABS Take 1 tablet by mouth daily.    . Melatonin 10 MG TABS Take 20 mg by mouth at bedtime.    . metoprolol tartrate (LOPRESSOR) 25 MG tablet Take 25 mg by mouth 2 (two) times  daily.    . nitroGLYCERIN (NITROSTAT) 0.4 MG SL tablet Place 0.4 mg under the tongue every 5 (five) minutes as needed for chest pain.    . pramipexole (MIRAPEX) 1 MG tablet Take 1.5 mg by mouth daily.     . sertraline (ZOLOFT) 100 MG tablet Take 100 mg by mouth every other day.    . traZODone (DESYREL) 100 MG tablet Take 100 mg by mouth every other day.    . cephALEXin (KEFLEX) 250 MG capsule Take 1 capsule (250 mg total) by mouth 4 (four) times daily. For 2days (Patient not taking: Reported on 09/13/2017) 8 capsule 0   No current facility-administered medications for this visit.      Past Medical History:  Diagnosis Date  . Anxiety   . Arthritis    "all over" (07/29/2017)  . Bulging disc    "still have one in my neck" (07/29/2017)  . Carotid arterial disease (HCC)    a. s/p L intracranial ICA stenting ([place]).  . Cataract    "left eye; didn't take it out cause of macular degeneration" (07/29/2017)  . Cervical cancer (HCC)    hx  . Chronic kidney disease, stage III (moderate) (HCC)    "only have 1  working kidney" (07/29/2017)  . COPD (chronic obstructive pulmonary disease) ([place])    a. quit smoking in 2007.  Marland Kitchen Coronary artery disease    a. 2007 s/p MI and LAD stenting;  10/2006 CABG x 4: LIMA->LAD, VG->OM->dLCX, VG->RPL;  c. 07/2013 MV: no ischemia.  . Depression   . Esophageal reflux   . Hepatitis 1968   "don't remember which one"  . History of blood transfusion 10/29/2006   "related to bypass OR"  . History of echocardiogram    a. 01/2012 Echo: EF 55%, mildly dil LA/RV, mild MR, Ao Sclerosis, mod TR.  Marland Kitchen Hypothyroid   . Insomnia   . Macular degeneration    "left eye" (07/29/2017)  . Mixed hyperlipidemia   . NSTEMI (non-ST elevated myocardial infarction) ([place]) 08/2006; 10/2006  . Osteoporosis   . Pneumonia    "several times before I quit smoking" (07/29/2017)  . Presence of permanent cardiac pacemaker   . PVT (paroxysmal ventricular tachycardia) ([place])   . Restless leg  syndrome   . Stroke [facility]) ?2008   denies residual on 07/29/2017  . Symptomatic bradycardia    a. s/p BSX Advantio dual chamber PPM [name]).  . Syncope and collapse   . Unspecified hearing loss     ROS:   All systems reviewed and negative except as noted in the HPI.   Past Surgical History:  Procedure Laterality Date  . ABDOMINAL HYSTERECTOMY  1970s   "partial"  . ANTERIOR CERVICAL DECOMP/DISCECTOMY FUSION  03/2001; 04/2005;?  date for 3rd OR   "used bone from my hips each time"  . BACK SURGERY    . CARDIAC CATHETERIZATION  2008  . CATARACT EXTRACTION W/ INTRAOCULAR LENS IMPLANT Right 05/2013  . CEREBRAL ANGIOGRAM Left 02/14/2007   w/ICA stent/notes 11/04/2010  . COLONOSCOPY    . CORONARY ANGIOPLASTY WITH STENT PLACEMENT  08/2006   "2 stents"  . CORONARY ARTERY BYPASS GRAFT  10/2006   "CABG X4"  . EYE SURGERY  05/2013   cataract removed  . INSERT / REPLACE / REMOVE PACEMAKER  03/2012   [place], [place]; [employer]  . KNEE ARTHROSCOPY Bilateral X 7   "4 on my left; 3 on my right" (07/29/2017)  . LAPAROSCOPIC CHOLECYSTECTOMY    . TUBAL LIGATION       Family History  Problem Relation Age of Onset  . Stomach cancer Mother   . Diabetes Mother   . Hyperlipidemia Mother   . Hypertension Father   . Lung cancer Father   . Alcohol abuse Father   . Early death Brother      Social History   Socioeconomic History  . Marital status: Widowed    Spouse name: Not on file  . Number of children: Not on file  . Years of education: Not on file  . Highest education level: Not on file  Social Needs  . Financial resource strain: Not on file  . Food insecurity - worry: Not on file  . Food insecurity - inability: Not on file  . Transportation needs - medical: Not on file  . Transportation needs - non-medical: Not on file  Occupational History  . 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 Smoking status: Former Smoker    Packs/day: 0.75     Years: 40.00    Pack years: 30.00    Types: Cigarettes    Start date: 09/28/1965    Last attempt to quit: 05/27/2006  Years since quitting: 11.3  . Smokeless tobacco: Never Used  Substance and Sexual Activity  . Alcohol use: Yes    Alcohol/week: 0.6 oz    Types: 1 Glasses of wine per week  . Drug use: No  . Sexual activity: No  Other Topics Concern  . Not on file  Social History Narrative   Lives in independent section of a retirement community.  Does not routinely exercise.     BP 120/72   Pulse 71   Ht 5\' 2"  (1.575 m)   Wt 206 lb 6.4 oz (93.6 kg)   SpO2 97%   BMI 37.75 kg/m   Physical Exam:  Obese appearing 71 year old woman, NAD HEENT: Unremarkable Neck: 6 cm JVD, no thyromegally Lymphatics:  No adenopathy Back:  No CVA tenderness Lungs:  Clear, except for rare basilar rales, no wheezes or rhonchi and no increased work of breathing obese, obese, HEART:  Regular rate rhythm, no murmurs, no rubs, no clicks Abd:  soft, obese, positive bowel sounds, no organomegally, no rebound, no guarding Ext:  2 plus pulses, no edema, no cyanosis, no clubbing Skin:  No rashes no nodules Neuro:  CN II through XII intact, motor grossly intact  DEVICE  Normal device function.  See PaceArt for details.   Assess/Plan: 1.  Pacemaker -her [place] pacemaker is working normally.  We will recheck in several months. 2.  Coronary artery disease -she denies anginal symptoms.  She admits to being very sedentary due to her severe arthritis in her knees. 3.  Obesity -this is a difficult problem.  Unfortunately she cannot walk much because of arthritis.  I have encouraged dietary changes.  Weight loss will be difficult for her because of her sedentary lifestyle  Crissie Sickles, MD

## 2017-09-14 LAB — CUP PACEART INCLINIC DEVICE CHECK
Brady Statistic RA Percent Paced: 98 %
Brady Statistic RV Percent Paced: 13 %
Date Time Interrogation Session: 20181218050000
Implantable Lead Implant Date: 20130611
Implantable Lead Location: 753860
Implantable Pulse Generator Implant Date: 20130611
Lead Channel Impedance Value: 456 Ohm
Lead Channel Impedance Value: 651 Ohm
Lead Channel Pacing Threshold Amplitude: 0.5 V
Lead Channel Sensing Intrinsic Amplitude: 1.7 mV
MDC IDC LEAD IMPLANT DT: 20130611
MDC IDC LEAD LOCATION: 753859
MDC IDC LEAD SERIAL: 566580
MDC IDC MSMT LEADCHNL RA PACING THRESHOLD PULSEWIDTH: 0.4 ms
MDC IDC MSMT LEADCHNL RV PACING THRESHOLD AMPLITUDE: 0.9 V
MDC IDC MSMT LEADCHNL RV PACING THRESHOLD PULSEWIDTH: 0.4 ms
MDC IDC MSMT LEADCHNL RV SENSING INTR AMPL: 5 mV
MDC IDC SET LEADCHNL RA PACING AMPLITUDE: 2 V
MDC IDC SET LEADCHNL RV PACING AMPLITUDE: 2.4 V
MDC IDC SET LEADCHNL RV PACING PULSEWIDTH: 0.4 ms
MDC IDC SET LEADCHNL RV SENSING SENSITIVITY: 2.5 mV
Pulse Gen Serial Number: 131914

## 2017-10-31 ENCOUNTER — Ambulatory Visit (INDEPENDENT_AMBULATORY_CARE_PROVIDER_SITE_OTHER): Payer: Medicare HMO | Admitting: *Deleted

## 2017-10-31 DIAGNOSIS — I495 Sick sinus syndrome: Secondary | ICD-10-CM | POA: Diagnosis not present

## 2017-10-31 NOTE — Progress Notes (Signed)
Remote pacemaker transmission.   

## 2017-11-01 ENCOUNTER — Encounter: Payer: Self-pay | Admitting: Cardiology

## 2017-11-25 LAB — CUP PACEART REMOTE DEVICE CHECK
Battery Remaining Longevity: 42 mo
Battery Remaining Percentage: 71 %
Brady Statistic RA Percent Paced: 98 %
Implantable Lead Location: 753860
Implantable Lead Model: 4469
Implantable Lead Model: 5076
Implantable Lead Serial Number: 566580
Implantable Pulse Generator Implant Date: 20130611
Lead Channel Impedance Value: 416 Ohm
Lead Channel Pacing Threshold Pulse Width: 0.4 ms
Lead Channel Setting Pacing Amplitude: 2 V
Lead Channel Setting Pacing Pulse Width: 0.4 ms
MDC IDC LEAD IMPLANT DT: 20130611
MDC IDC LEAD IMPLANT DT: 20130611
MDC IDC LEAD LOCATION: 753859
MDC IDC MSMT LEADCHNL RA IMPEDANCE VALUE: 599 Ohm
MDC IDC MSMT LEADCHNL RA PACING THRESHOLD AMPLITUDE: 0.5 V
MDC IDC PG SERIAL: 131914
MDC IDC SESS DTM: 20190204075200
MDC IDC SET LEADCHNL RV PACING AMPLITUDE: 2.4 V
MDC IDC SET LEADCHNL RV SENSING SENSITIVITY: 2.5 mV
MDC IDC STAT BRADY RV PERCENT PACED: 6 %

## 2018-01-30 ENCOUNTER — Ambulatory Visit (INDEPENDENT_AMBULATORY_CARE_PROVIDER_SITE_OTHER): Payer: Medicare HMO | Admitting: *Deleted

## 2018-01-30 DIAGNOSIS — I495 Sick sinus syndrome: Secondary | ICD-10-CM

## 2018-01-30 NOTE — Progress Notes (Signed)
Remote pacemaker transmission.   

## 2018-01-31 ENCOUNTER — Ambulatory Visit: Payer: Medicare HMO | Admitting: Cardiology

## 2018-01-31 ENCOUNTER — Encounter: Payer: Self-pay | Admitting: Cardiology

## 2018-01-31 VITALS — BP 92/60 | HR 72 | Ht 62.0 in | Wt 214.0 lb

## 2018-01-31 DIAGNOSIS — E7800 Pure hypercholesterolemia, unspecified: Secondary | ICD-10-CM

## 2018-01-31 DIAGNOSIS — Z951 Presence of aortocoronary bypass graft: Secondary | ICD-10-CM

## 2018-01-31 DIAGNOSIS — I209 Angina pectoris, unspecified: Secondary | ICD-10-CM

## 2018-01-31 DIAGNOSIS — Z45018 Encounter for adjustment and management of other part of cardiac pacemaker: Secondary | ICD-10-CM

## 2018-01-31 MED ORDER — ISOSORBIDE MONONITRATE ER 30 MG PO TB24: 30.0000 mg | ORAL_TABLET | Freq: Every day | ORAL | 3 refills | Status: DC

## 2018-01-31 NOTE — Patient Instructions (Addendum)
Medication Instructions:  Your physician has recommended you make the following change in your medication:   INCREASE: Imdur to 30 mg (1 tablet) one time a day   If you need a refill on your cardiac medications, please contact your pharmacy first.  Labwork: None ordered   Testing/Procedures: None ordered   Follow-Up: Your physician wants you to follow-up in: 6 months with Truitt Merle, NP. You will receive a reminder letter in the mail two months in advance. If you don't receive a letter, please call our office to schedule the follow-up appointment.  Any Other Special Instructions Will Be Listed Below (If Applicable).   Thank you for choosing [facility]    [phone]  If you need a refill on your cardiac medications before your next appointment, please call your pharmacy.

## 2018-01-31 NOTE — Progress Notes (Signed)
Cardiology Office Note:    Date:  01/31/2018   ID:  [name], DOB 03/22/1946, MRN [id number]  PCP:  [name] Glaze, DO  Cardiologist: Dr. Bronson [name] (previously)  [name] Furbish, MD    Referring MD: [name] Cuff, MD     History of Present Illness:    [name] is a 72 y.o. female here for the follow-up of coronary atherosclerosis.   Moved to the [place] in [place].   She saw Dr. Bronson [name] last on 08/23/16 (but drive was too far at this point) as well as Dr. Lovena [name] in 09/10/16. She would like to consolidate her care here in [place].  Has history of CAD/CABG, HTN, HL, COPD, CVA, and pacemaker.  NUC 08/10/2013 - low risk, no ischemia  Rare NTG use. Balance issues, cane/ walker.   Left kidney small, non functioning well.   Overall she is doing quite well. She has lost some weight. She is using walker. She does get short of breath with activity. Deconditioning. Continue to promote movement.  01/31/2018-overall doing fairly well.  Has been complaining of excessive fatigue, leg swelling, diarrhea, back pain, passing out, bruising. Does have some chest pain, like a big bubble in epigastric, sternal like going to explode, out of breath real easy she states. More pressure than pain. Started a few weeks ago. Not when sitting. Only when walking or doing something. If bend over has to straighten up to catch breath. Did not feel like this before CABG. Has COPD and URI. Started after that. Legs are awful. Cramps in feet and hands (fingers contort). Feet swell at at end of day. One kidney does not work well. Stomach is horrible, IBS, no control. Happens in store. House bound.   Past Medical History:  Diagnosis Date  . Anxiety   . Arthritis    "all over" (07/29/2017)  . Bulging disc    "still have one in my neck" (07/29/2017)  . Carotid arterial disease (HCC)    a. s/p L intracranial ICA stenting ([name]).  . Cataract    "left eye; didn't take it out cause of  macular degeneration" (07/29/2017)  . Cervical cancer (HCC)    hx  . Chronic kidney disease, stage III (moderate) ([place])    "only have 1 working kidney" (07/29/2017)  . COPD (chronic obstructive pulmonary disease) ([place])    a. quit smoking in 2007.  Marland Kitchen Coronary artery disease    a. 2007 s/p MI and LAD stenting;  10/2006 CABG x 4: LIMA->LAD, VG->OM->dLCX, VG->RPL;  c. 07/2013 MV: no ischemia.  . Depression   . Esophageal reflux   . Hepatitis 1968   "don't remember which one"  . History of blood transfusion 10/29/2006   "related to bypass OR"  . History of echocardiogram    a. 01/2012 Echo: EF 55%, mildly dil LA/RV, mild MR, Ao Sclerosis, mod TR.  Marland Kitchen Hypothyroid   . Insomnia   . Macular degeneration    "left eye" (07/29/2017)  . Mixed hyperlipidemia   . NSTEMI (non-ST elevated myocardial infarction) ([place]) 08/2006; 10/2006  . Osteoporosis   . Pneumonia    "several times before I quit smoking" (07/29/2017)  . Presence of permanent cardiac pacemaker   . PVT (paroxysmal ventricular tachycardia) ([place])   . Restless leg syndrome   . Stroke [facility]) ?2008   denies residual on 07/29/2017  . Symptomatic bradycardia    a. s/p BSX Advantio dual chamber PPM [name]).  . Syncope and collapse   . Unspecified  hearing loss     Past Surgical History:  Procedure Laterality Date  . ABDOMINAL HYSTERECTOMY  1970s   "partial"  . ANTERIOR CERVICAL DECOMP/DISCECTOMY FUSION  03/2001; 04/2005;?  date for 3rd OR   "used bone from my hips each time"  . BACK SURGERY    . CARDIAC CATHETERIZATION  2008  . CATARACT EXTRACTION W/ INTRAOCULAR LENS IMPLANT Right 05/2013  . CEREBRAL ANGIOGRAM Left 02/14/2007   w/ICA stent/notes 11/04/2010  . COLONOSCOPY    . CORONARY ANGIOPLASTY WITH STENT PLACEMENT  08/2006   "2 stents"  . CORONARY ARTERY BYPASS GRAFT  10/2006   "CABG X4"  . EYE SURGERY  05/2013   cataract removed  . INSERT / REPLACE / REMOVE PACEMAKER  03/2012   [place], [place]; [employer]  . KNEE  ARTHROSCOPY Bilateral X 7   "4 on my left; 3 on my right" (07/29/2017)  . LAPAROSCOPIC CHOLECYSTECTOMY    . TUBAL LIGATION      Current Medications: Current Meds  Medication Sig  . albuterol (PROVENTIL HFA;VENTOLIN HFA) 108 (90 Base) MCG/ACT inhaler Inhale 1-2 puffs into the lungs every 6 (six) hours as needed for wheezing or shortness of breath.  . ALPRAZolam (XANAX) 1 MG tablet Take 1 tablet by mouth at bedtime.  Marland Kitchen aspirin 81 MG tablet Take 81 mg by mouth daily.  Marland Kitchen atorvastatin (LIPITOR) 20 MG tablet Take 20 mg by mouth daily.  . cephALEXin (KEFLEX) 250 MG capsule Take 1 capsule (250 mg total) by mouth 4 (four) times daily. For 2days  . clopidogrel (PLAVIX) 75 MG tablet Take 75 mg by mouth daily.  . Ferrous Sulfate (IRON) 325 (65 Fe) MG TABS Take 1 tablet by mouth daily.  . folic acid (FOLVITE) 1 MG tablet Take 1 mg by mouth daily.  . furosemide (LASIX) 20 MG tablet Take 20 mg by mouth daily as needed (swelling).   Marland Kitchen levothyroxine (SYNTHROID, LEVOTHROID) 75 MCG tablet TAKE 1 TABLET EVERY DAY  (NEW  DOSE)  . Magnesium 250 MG TABS Take 1 tablet by mouth daily.  . Melatonin 10 MG TABS Take 20 mg by mouth at bedtime.  . metoprolol tartrate (LOPRESSOR) 25 MG tablet Take 25 mg by mouth 2 (two) times daily.  . nitroGLYCERIN (NITROSTAT) 0.4 MG SL tablet Place 0.4 mg under the tongue every 5 (five) minutes as needed for chest pain.  . pramipexole (MIRAPEX) 1 MG tablet Take 1.5 mg by mouth daily.   . sertraline (ZOLOFT) 100 MG tablet Take 100 mg by mouth every other day.  . traZODone (DESYREL) 100 MG tablet Take 100 mg by mouth every other day.  . [DISCONTINUED] isosorbide mononitrate (IMDUR) 30 MG 24 hr tablet Take 0.5 tablets (15 mg total) by mouth daily.     Allergies:   Codeine and Iohexol   Social History   Socioeconomic History  . Marital status: Widowed    Spouse name: Not on file  . Number of children: Not on file  . Years of education: Not on file  . Highest education level: Not  on file  Occupational History  . 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 Financial resource strain: Not on file  . Food insecurity:    Worry: Not on file    Inability: Not on file  . Transportation needs:    Medical: Not on file    Non-medical: Not on file  Tobacco Use  . Smoking status: Former Smoker  Packs/day: 0.75    Years: 40.00    Pack years: 30.00    Types: Cigarettes    Start date: 09/28/1965    Last attempt to quit: 05/27/2006    Years since quitting: 11.6  . Smokeless tobacco: Never Used  Substance and Sexual Activity  . Alcohol use: Yes    Alcohol/week: 0.6 oz    Types: 1 Glasses of wine per week  . Drug use: No  . Sexual activity: Never  Lifestyle  . Physical activity:    Days per week: Not on file    Minutes per session: Not on file  . Stress: Not on file  Relationships  . Social connections:    Talks on phone: Not on file    Gets together: Not on file    Attends religious service: Not on file    Active member of club or organization: Not on file    Attends meetings of clubs or organizations: Not on file    Relationship status: Not on file  Other Topics Concern  . Not on file  Social History Narrative   Lives in independent section of a retirement community.  Does not routinely exercise.     Family History: The patient's family history includes Alcohol abuse in her father; Diabetes in her mother; Early death in her brother; Hyperlipidemia in her mother; Hypertension in her father; Lung cancer in her father; Stomach cancer in her mother. ROS:   Please see the history of present illness.     All other systems reviewed and are negative.  EKGs/Labs/Other Studies Reviewed:    The following studies were reviewed today: Prior office notes, lab work, EKG  EKG: NSR 79. Normal. Personally viewed.   Recent Labs: 04/14/2017: B Natriuretic Peptide 137.2 04/16/2017: Magnesium 2.4; TSH 1.267 07/29/2017: ALT 18; BUN 15; Creatinine,  Ser 1.50; Hemoglobin 12.8; Platelets 148; Potassium 3.9; Sodium 137   Recent Lipid Panel    Component Value Date/Time   CHOL 168 04/16/2017 0409   TRIG 98 04/16/2017 0409   HDL 61 04/16/2017 0409   CHOLHDL 2.8 04/16/2017 0409   VLDL 20 04/16/2017 0409   LDLCALC 87 04/16/2017 0409    Physical Exam:    VS:  BP 92/60   Pulse 72   Ht 5\' 2"  (1.575 m)   Wt 214 lb (97.1 kg)   SpO2 94%   BMI 39.14 kg/m     Wt Readings from Last 3 Encounters:  01/31/18 214 lb (97.1 kg)  09/13/17 206 lb 6.4 oz (93.6 kg)  07/30/17 203 lb 11.2 oz (92.4 kg)     GEN: Well nourished, well developed, in no acute distress, obese HEENT: normal  Neck: no JVD, carotid bruits, or masses Cardiac: RRR; no murmurs, rubs, or gallops,no edema, CABG scar Respiratory:  clear to auscultation bilaterally, normal work of breathing GI: soft, nontender, nondistended, + BS MS: no deformity or atrophy  Skin: warm and dry, no rash varicose veins Neuro:  Alert and Oriented x 3, Strength and sensation are intact Psych: Mildly depressed mood, full affect   ASSESSMENT:    1. Angina pectoris ([place])   2. Hx of CABG   3. Morbid obesity ([place])   4. Pure hypercholesterolemia   5. Pacemaker -[place]:    In order of problems listed above:  CAD/CABG/history of MI/angina  -I will increase her Imdur to 30 mg once a day.  Continue with aggressive secondary prevention.  Her bubblelike sensation in her stomach, pressure  could be GI related or related to her IBS as well.  If symptoms worsen or become more worrisome, one could consider cardiac catheterization however we need to weigh the risks and benefits given her creatinine of 1.5.  Continue to encourage exercise.  - ASA, Imdur, metop, statin    Angina  -As above  [place] HTN  - controlled  - meds reviewed, in fact usually she has low normal blood pressure since her bypass.  Overall she has been doing well.  No further dizzy episodes.  HL  - at goal.  Labs reviewed  - no issues with medication, LDL 87 and 2018  History of CVA  - ASA, Plavix. Stable  Pacer  - Dr. Lovena [name]  - Stable.  [employer] dual-chamber.  Last visit reviewed, working normally.  No high risk episodes.  She had a previous episode where she woke up on the bathroom floor at night bowel and bladder incontinence, bleeding.  [name] edema  - chronic, this morning appears normal.  - lasix 40 and 20 alternating.   - Quite mild currently. She states that by the end of the day it is troublesome to get shoes on.  No changes  Morbid obesity  - BMI greater than 35 with 2 comorbidities. Continue to work on weight. She states that most of her adult life she was 130 pounds. After her first heart attack she continued to gain weight.  Challenging for her to exercise because of arthritis  DNR  - She has been this way for years.   Multitude of medical issues, restless leg, IBS, COPD, CAD, morbid obesity, chronic fatigue.  Medication Adjustments/Labs and Tests Ordered: Current medicines are reviewed at length with the patient today.  Concerns regarding medicines are outlined above. Labs and tests ordered and medication changes are outlined in the patient instructions below:  Patient Instructions  Medication Instructions:  Your physician has recommended you make the following change in your medication:   INCREASE: Imdur to 30 mg (1 tablet) one time a day   If you need a refill on your cardiac medications, please contact your pharmacy first.  Labwork: None ordered   Testing/Procedures: None ordered   Follow-Up: Your physician wants you to follow-up in: 6 months with [name] Merle, NP. You will receive a reminder letter in the mail two months in advance. If you don't receive a letter, please call our office to schedule the follow-up appointment.  Any Other Special Instructions Will Be Listed Below (If Applicable).   Thank you for choosing [facility]     [phone]  If you need a refill on your cardiac medications before your next appointment, please call your pharmacy.      Signed, [name] Furbish, MD  01/31/2018 9:09 AM    [facility]

## 2018-02-01 ENCOUNTER — Encounter: Payer: Self-pay | Admitting: Cardiology

## 2018-02-13 LAB — CUP PACEART REMOTE DEVICE CHECK
Battery Remaining Longevity: 42 mo
Battery Remaining Percentage: 67 %
Brady Statistic RA Percent Paced: 97 %
Date Time Interrogation Session: 20190506090100
Implantable Lead Implant Date: 20130611
Implantable Lead Location: 753859
Implantable Lead Serial Number: 566580
Lead Channel Setting Pacing Amplitude: 2.4 V
Lead Channel Setting Pacing Pulse Width: 0.4 ms
Lead Channel Setting Sensing Sensitivity: 2.5 mV
MDC IDC LEAD IMPLANT DT: 20130611
MDC IDC LEAD LOCATION: 753860
MDC IDC MSMT LEADCHNL RA IMPEDANCE VALUE: 595 Ohm
MDC IDC MSMT LEADCHNL RA PACING THRESHOLD AMPLITUDE: 0.5 V
MDC IDC MSMT LEADCHNL RA PACING THRESHOLD PULSEWIDTH: 0.4 ms
MDC IDC MSMT LEADCHNL RV IMPEDANCE VALUE: 407 Ohm
MDC IDC PG IMPLANT DT: 20130611
MDC IDC SET LEADCHNL RA PACING AMPLITUDE: 2 V
MDC IDC STAT BRADY RV PERCENT PACED: 11 %
Pulse Gen Serial Number: 131914

## 2018-05-01 ENCOUNTER — Ambulatory Visit (INDEPENDENT_AMBULATORY_CARE_PROVIDER_SITE_OTHER): Payer: Medicare HMO | Admitting: *Deleted

## 2018-05-01 DIAGNOSIS — Z8673 Personal history of transient ischemic attack (TIA), and cerebral infarction without residual deficits: Secondary | ICD-10-CM | POA: Diagnosis not present

## 2018-05-01 DIAGNOSIS — I495 Sick sinus syndrome: Secondary | ICD-10-CM

## 2018-05-02 NOTE — Progress Notes (Signed)
Remote pacemaker transmission.   

## 2018-05-24 LAB — CUP PACEART REMOTE DEVICE CHECK
Battery Remaining Longevity: 42 mo
Battery Remaining Percentage: 65 %
Brady Statistic RA Percent Paced: 97 %
Brady Statistic RV Percent Paced: 12 %
Implantable Lead Implant Date: 20130611
Implantable Lead Location: 753859
Implantable Lead Model: 4469
Implantable Lead Model: 5076
Lead Channel Impedance Value: 605 Ohm
Lead Channel Setting Pacing Amplitude: 2 V
Lead Channel Setting Pacing Amplitude: 2.4 V
Lead Channel Setting Pacing Pulse Width: 0.4 ms
Lead Channel Setting Sensing Sensitivity: 2.5 mV
MDC IDC LEAD IMPLANT DT: 20130611
MDC IDC LEAD LOCATION: 753860
MDC IDC LEAD SERIAL: 566580
MDC IDC MSMT LEADCHNL RA PACING THRESHOLD AMPLITUDE: 0.5 V
MDC IDC MSMT LEADCHNL RA PACING THRESHOLD PULSEWIDTH: 0.4 ms
MDC IDC MSMT LEADCHNL RV IMPEDANCE VALUE: 410 Ohm
MDC IDC PG IMPLANT DT: 20130611
MDC IDC SESS DTM: 20190805065000
Pulse Gen Serial Number: 131914

## 2018-07-11 ENCOUNTER — Encounter: Payer: Self-pay | Admitting: Nurse Practitioner

## 2018-07-31 ENCOUNTER — Encounter: Payer: Self-pay | Admitting: Nurse Practitioner

## 2018-07-31 ENCOUNTER — Ambulatory Visit (INDEPENDENT_AMBULATORY_CARE_PROVIDER_SITE_OTHER): Payer: Medicare HMO | Admitting: *Deleted

## 2018-07-31 ENCOUNTER — Ambulatory Visit: Payer: Medicare HMO | Admitting: Nurse Practitioner

## 2018-07-31 VITALS — BP 102/70 | HR 71 | Ht 61.0 in | Wt 224.8 lb

## 2018-07-31 DIAGNOSIS — E7800 Pure hypercholesterolemia, unspecified: Secondary | ICD-10-CM | POA: Diagnosis not present

## 2018-07-31 DIAGNOSIS — I259 Chronic ischemic heart disease, unspecified: Secondary | ICD-10-CM | POA: Diagnosis not present

## 2018-07-31 DIAGNOSIS — I10 Essential (primary) hypertension: Secondary | ICD-10-CM

## 2018-07-31 DIAGNOSIS — I495 Sick sinus syndrome: Secondary | ICD-10-CM

## 2018-07-31 DIAGNOSIS — Z951 Presence of aortocoronary bypass graft: Secondary | ICD-10-CM

## 2018-07-31 DIAGNOSIS — R55 Syncope and collapse: Secondary | ICD-10-CM

## 2018-07-31 NOTE — Patient Instructions (Addendum)
We will be checking the following labs today - NONE    Medication Instructions:    Continue with your current medicines.    If you need a refill on your cardiac medications before your next appointment, please call your pharmacy.     Testing/Procedures To Be Arranged:  N/A  Follow-Up:   See Dr. Marlou Porch in 6 months  Needs recall scheduled with Dr. Lovena Le for December.   Referral to neurology    At [facility], you and your health needs are our priority.  As part of our continuing mission to provide you with exceptional heart care, we have created designated Provider Care Teams.  These Care Teams include your primary Cardiologist (physician) and Advanced Practice Providers (APPs -  Physician Assistants and Nurse Practitioners) who all work together to provide you with the care you need, when you need it.  Special Instructions:  . None  Call the [place] office at [phone] if you have any questions, problems or concerns.

## 2018-07-31 NOTE — Progress Notes (Signed)
Remote pacemaker transmission.   

## 2018-07-31 NOTE — Progress Notes (Signed)
CARDIOLOGY OFFICE NOTE  Date:  07/31/2018    [name] Date of Birth: 05/11/1946 Medical Record #[id number]  PCP:  Stacie Glaze, DO  Cardiologist:  [place]  Chief Complaint  Patient presents with  . Coronary Artery Disease    6 month check - seen for Dr. Marlou Porch    History of Present Illness: [name] is a 72 y.o. female who presents today for a 6 month check. Seen for Dr. Marlou Porch.   She has seen Dr. Bronson Ing last on 08/23/16 (but drive was too far at this point) as well as Dr. Lovena Le in 08/2017. She has preferred to consolidate her care here in [place].  She has a history of CAD with remote PCI and then subsequent CABG in 2008, HTN, HLD, COPD, CVA, and pacemaker. Low risk Myoview from 2014. Seems more limited by balance issues. 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   Noted she is a DNR per prior note.   Last seen back in May - lots of somatic complaints - some chest pain. Overall felt to be stable. Imdur was increased.   Comes in today. Here alone. Lots of issues. Hard to follow her history. Says she has "so many issues". Hard time sleeping. Falling out of the bed - last time was last week - did not get checked out - blood all over the curtains but no real recollection of the event. Had a spell like this a year ago - lost control of bowels/bladder with memory lapse.  Does have an alert now - did not use. Her left side is affected from her prior stroke. She does not feel she is ready for assisted living. Does not drive. She says she "has stopped going to all doctors". Lots of swelling. Eating out - certainly using too much salt (had [employer] and hot dogs in the past week) - not able to cook and then tells me she does not use salt.  Using Trazadone and Xanax for sleep. Unclear what she is and is not taking. She thinks one of her toes are dead - this is "the least of my worries". Her primary concern is the falling -  she does not remember how it happens - her legs don't work for several hours. Unclear if she is having chest pain - notes she had a spell last week where it felt like she had a band around her chest that was being pulled to tight. Has a tender spot over the upper left chest. Transmitted her PPM earlier today.   Past Medical History:  Diagnosis Date  . Anxiety   . Arthritis    "all over" (07/29/2017)  . Bulging disc    "still have one in my neck" (07/29/2017)  . Carotid arterial disease (HCC)    a. s/p L intracranial ICA stenting ([facility]war).  . Cataract    "left eye; didn't take it out cause of macular degeneration" (07/29/2017)  . Cervical cancer (HCC)    hx  . Chronic kidney disease, stage III (moderate) ([place])    "only have 1 working kidney" (07/29/2017)  . COPD (chronic obstructive pulmonary disease) ([place])    a. quit smoking in 2007.  Marland Kitchen Coronary artery disease    a. 2007 s/p MI and LAD stenting;  10/2006 CABG x 4: LIMA->LAD, VG->OM->dLCX, VG->RPL;  c. 07/2013 MV: no ischemia.  . Depression   . Esophageal reflux   . Hepatitis 1968   "  don't remember which one"  . History of blood transfusion 10/29/2006   "related to bypass OR"  . History of echocardiogram    a. 01/2012 Echo: EF 55%, mildly dil LA/RV, mild MR, Ao Sclerosis, mod TR.  Marland Kitchen Hypothyroid   . Insomnia   . Macular degeneration    "left eye" (07/29/2017)  . Mixed hyperlipidemia   . NSTEMI (non-ST elevated myocardial infarction) ([place]) 08/2006; 10/2006  . Osteoporosis   . Pneumonia    "several times before I quit smoking" (07/29/2017)  . Presence of permanent cardiac pacemaker   . PVT (paroxysmal ventricular tachycardia) ([place])   . Restless leg syndrome   . Stroke [facility]) ?2008   denies residual on 07/29/2017  . Symptomatic bradycardia    a. s/p BSX Advantio dual chamber PPM Beckie Salts).  . Syncope and collapse   . Unspecified hearing loss     Past Surgical History:  Procedure Laterality Date  . ABDOMINAL HYSTERECTOMY  1970s    "partial"  . ANTERIOR CERVICAL DECOMP/DISCECTOMY FUSION  03/2001; 04/2005;?  date for 3rd OR   "used bone from my hips each time"  . BACK SURGERY    . CARDIAC CATHETERIZATION  2008  . CATARACT EXTRACTION W/ INTRAOCULAR LENS IMPLANT Right 05/2013  . CEREBRAL ANGIOGRAM Left 02/14/2007   w/ICA stent/notes 11/04/2010  . COLONOSCOPY    . CORONARY ANGIOPLASTY WITH STENT PLACEMENT  08/2006   "2 stents"  . CORONARY ARTERY BYPASS GRAFT  10/2006   "CABG X4"  . EYE SURGERY  05/2013   cataract removed  . INSERT / REPLACE / REMOVE PACEMAKER  03/2012   [place], [place]; [employer]  . KNEE ARTHROSCOPY Bilateral X 7   "4 on my left; 3 on my right" (07/29/2017)  . LAPAROSCOPIC CHOLECYSTECTOMY    . TUBAL LIGATION       Medications: Current Meds  Medication Sig  . albuterol (PROVENTIL HFA;VENTOLIN HFA) 108 (90 Base) MCG/ACT inhaler Inhale 1-2 puffs into the lungs every 6 (six) hours as needed for wheezing or shortness of breath.  . ALPRAZolam (XANAX) 1 MG tablet Take 1 tablet by mouth at bedtime.  Marland Kitchen aspirin 81 MG tablet Take 81 mg by mouth daily.  Marland Kitchen atorvastatin (LIPITOR) 20 MG tablet Take 20 mg by mouth daily.  . cephALEXin (KEFLEX) 250 MG capsule Take 1 capsule (250 mg total) by mouth 4 (four) times daily. For 2days  . clopidogrel (PLAVIX) 75 MG tablet Take 75 mg by mouth daily.  . Ferrous Sulfate (IRON) 325 (65 Fe) MG TABS Take 1 tablet by mouth daily.  . folic acid (FOLVITE) 1 MG tablet Take 1 mg by mouth daily.  . furosemide (LASIX) 20 MG tablet Take 20 mg by mouth daily as needed (swelling).   . isosorbide mononitrate (IMDUR) 30 MG 24 hr tablet Take 1 tablet (30 mg total) by mouth daily.  Marland Kitchen levothyroxine (SYNTHROID, LEVOTHROID) 75 MCG tablet TAKE 1 TABLET EVERY DAY  (NEW  DOSE)  . Magnesium 250 MG TABS Take 1 tablet by mouth daily.  . Melatonin 10 MG TABS Take 20 mg by mouth at bedtime.  . metoprolol tartrate (LOPRESSOR) 25 MG tablet Take 25 mg by mouth 2 (two) times daily.  .  nitroGLYCERIN (NITROSTAT) 0.4 MG SL tablet Place 0.4 mg under the tongue every 5 (five) minutes as needed for chest pain.  . pramipexole (MIRAPEX) 1 MG tablet Take 1.5 mg by mouth daily.   . sertraline (ZOLOFT) 100 MG tablet Take 100 mg by mouth  every other day.  . traZODone (DESYREL) 100 MG tablet Take 100 mg by mouth every other day.  . traZODone (DESYREL) 100 MG tablet Take 100 mg by mouth 2 (two) times daily.     Allergies: Allergies  Allergen Reactions  . Codeine     swelling  . Iohexol      Desc: PT HAD CONTRAST REACTION YRS AGO NEEDS 13 HR PREP FOR ALL CONTRAST STUDIES     Social History: The patient  reports that she quit smoking about 12 years ago. Her smoking use included cigarettes. She started smoking about 52 years ago. She has a 30.00 pack-year smoking history. She has never used smokeless tobacco. She reports that she drinks about 1.0 standard drinks of alcohol per week. She reports that she does not use drugs.   Family History: The patient's family history includes Alcohol abuse in her father; Diabetes in her mother; Early death in her brother; Hyperlipidemia in her mother; Hypertension in her father; Lung cancer in her father; Stomach cancer in her mother.   Review of Systems: Please see the history of present illness.   Otherwise, the review of systems is positive for none.   All other systems are reviewed and negative.   Physical Exam: VS:  BP 102/70 (BP Location: Left Arm, Patient Position: Sitting, Cuff Size: Large)   Pulse 71   Ht 5\' 1"  (1.549 m)   Wt 224 lb 12.8 oz (102 kg)   SpO2 97% Comment: at rest  BMI 42.48 kg/m  .  BMI Body mass index is 42.48 kg/m.  Wt Readings from Last 3 Encounters:  07/31/18 224 lb 12.8 oz (102 kg)  01/31/18 214 lb (97.1 kg)  09/13/17 206 lb 6.4 oz (93.6 kg)    General: Alert. She is in no acute distress. Using a walker. Looks unsteady. Poor historian.   Weight continues to climb.  HEENT: Normal.  Neck: Supple, no JVD,  carotid bruits, or masses noted.  Cardiac: Regular rate and rhythm. Heart tones are distant. She has trace edema. She has good distal pulses.  Respiratory:  Lungs are clear to auscultation bilaterally with normal work of breathing.  GI: Soft and nontender.  MS: No deformity or atrophy. Gait and ROM intact but looks unsteady. Using a walker.   Skin: Warm and dry. Color is normal.  Neuro:  Strength and sensation are intact and no gross focal deficits noted.  Psych: Alert, appropriate and with normal affect.   LABORATORY DATA:  EKG:  EKG is ordered today. This shows an A paced rhythm/NSR.   Lab Results  Component Value Date   WBC 5.5 07/29/2017   HGB 12.8 07/29/2017   HCT 39.4 07/29/2017   PLT 148 (L) 07/29/2017   GLUCOSE 92 07/29/2017   CHOL 168 04/16/2017   TRIG 98 04/16/2017   HDL 61 04/16/2017   LDLCALC 87 04/16/2017   ALT 18 07/29/2017   AST 17 07/29/2017   NA 137 07/29/2017   K 3.9 07/29/2017   CL 102 07/29/2017   CREATININE 1.50 (H) 07/29/2017   BUN 15 07/29/2017   CO2 26 07/29/2017   TSH 1.267 04/16/2017   INR 1.04 07/18/2017   Labs from [facility] from last month noted.   BNP (last 3 results) No results for input(s): BNP in the last 8760 hours.  ProBNP (last 3 results) No results for input(s): PROBNP in the last 8760 hours.   Other Studies Reviewed Today:  Echo Study Conclusions 07/2017 - Left ventricle: The cavity  size was normal. Systolic function was   normal. The estimated ejection fraction was in the range of 60%   to 65%. - Mitral valve: There was mild regurgitation. - Right ventricle: The cavity size was moderately dilated. Systolic   function was mildly reduced. - Right atrium: The atrium was severely dilated. - Tricuspid valve: There was severe regurgitation. - Pulmonary arteries: PA peak pressure: 41 mm Hg (S).   Final Interpretation: Right Carotid: There is evidence in the right ICA of a 1-39% stenosis.  Left Carotid: There is evidence in  the left ICA of a 1-39% stenosis.  Vertebrals: Both vertebral arteries were patent with antegrade flow.  *See table(s) above for measurements and observations.   Electronically signed by Servando Snare on 07/29/2017 at 6:30:58 PM.      Assessment/Plan:  1. CAD - remote PCI/CABG - her symptoms are quite hard to sort out - she seems more fixated on the falls/lapse of memory. She is on chronic Plavix - not sure if this is related to old PCI or her stroke.   2. Morbid obesity - I do not see this changing.   3. Underlying PPM - transmission from earlier today is ok. Needs to see Dr. Lovena Le in December.   4. HLD - on statin - checked by PCP.   5. HTN - BP ok today - actually soft.  Unclear what she is taking.   6. Multitude of somatic complaints - most concerning is the fall/syncope/loss of bowel/bladder, etc. Very hard to pin down when all this actually happened. her PPM is ok today off remote monitoring. She has had old CVA - ?seizure disorder. May be over medicated as well. On lots of sedating drugs. I think her medicines are unclear as well.  Very resistant at first to seeing neurology but does agree towards the end of the visit. She does not drive fortunately. She is quite resistant to going to a higher level of care at the facility where she resides.    Current medicines are reviewed with the patient today.  The patient does not have concerns regarding medicines other than what has been noted above.  The following changes have been made:  See above.  Labs/ tests ordered today include:    Orders Placed This Encounter  Procedures  . Ambulatory referral to Neurology  . EKG 12-Lead     Disposition:   FU with Dr. Marlou Porch in 6 months.   Patient is agreeable to this plan and will call if any problems develop in the interim.   SignedTruitt Merle, NP  07/31/2018 2:03 PM  [facility] [address] [place], [place]  [address] Phone:  [phone] Fax: [phone]

## 2018-08-02 ENCOUNTER — Encounter: Payer: Self-pay | Admitting: Neurology

## 2018-08-03 ENCOUNTER — Encounter: Payer: Self-pay | Admitting: Cardiology

## 2018-09-04 ENCOUNTER — Encounter: Payer: Self-pay | Admitting: Internal Medicine

## 2018-09-04 ENCOUNTER — Ambulatory Visit: Payer: Medicare HMO | Admitting: Internal Medicine

## 2018-09-04 VITALS — BP 144/76 | HR 74 | Ht 61.0 in | Wt 225.0 lb

## 2018-09-04 DIAGNOSIS — Z951 Presence of aortocoronary bypass graft: Secondary | ICD-10-CM

## 2018-09-04 DIAGNOSIS — Z45018 Encounter for adjustment and management of other part of cardiac pacemaker: Secondary | ICD-10-CM | POA: Diagnosis not present

## 2018-09-04 DIAGNOSIS — I10 Essential (primary) hypertension: Secondary | ICD-10-CM

## 2018-09-04 DIAGNOSIS — I495 Sick sinus syndrome: Secondary | ICD-10-CM | POA: Diagnosis not present

## 2018-09-04 NOTE — Patient Instructions (Addendum)
Medication Instructions:  Your physician has recommended you make the following change in your medication:   1.  STOP taking PLAVIX  2.  Continue taking Aspirin 81 mg one tablet by mouth daily   Labwork: None ordered.  Testing/Procedures: None ordered.  Follow-Up: Your physician wants you to follow-up in: one year with Dr. Lovena Le.   You will receive a reminder letter in the mail two months in advance. If you don't receive a letter, please call our office to schedule the follow-up appointment.  Remote monitoring is used to monitor your Pacemaker from home. This monitoring reduces the number of office visits required to check your device to one time per year. It allows [place] to keep an eye on the functioning of your device to ensure it is working properly. You are scheduled for a device check from home on 10/30/2018. You may send your transmission at any time that day. If you have a wireless device, the transmission will be sent automatically. After your physician reviews your transmission, you will receive a postcard with your next transmission date.  Any Other Special Instructions Will Be Listed Below (If Applicable).  If you need a refill on your cardiac medications before your next appointment, please call your pharmacy.

## 2018-09-04 NOTE — Progress Notes (Signed)
HPI [name] returns today for ongoing evauation of her [facility] PPM, and a h/o CAD, obesity, and HTN. She has multiple aches but c/o worsening bruising on plavix. She has muscluloskeletal pain and wants to stop her statin. No sob and no chest pain.  Allergies  Allergen Reactions  . Codeine     swelling  . Iohexol      Desc: PT HAD CONTRAST REACTION YRS AGO NEEDS 13 HR PREP FOR ALL CONTRAST STUDIES      Current Outpatient Medications  Medication Sig Dispense Refill  . albuterol (PROVENTIL HFA;VENTOLIN HFA) 108 (90 Base) MCG/ACT inhaler Inhale 1-2 puffs into the lungs every 6 (six) hours as needed for wheezing or shortness of breath. 1 Inhaler 5  . ALPRAZolam (XANAX) 1 MG tablet Take 1 tablet by mouth at bedtime.  1  . aspirin 81 MG tablet Take 81 mg by mouth daily.    Marland Kitchen atorvastatin (LIPITOR) 20 MG tablet Take 20 mg by mouth daily.    . cephALEXin (KEFLEX) 250 MG capsule Take 1 capsule (250 mg total) by mouth 4 (four) times daily. For 2days 8 capsule 0  . clopidogrel (PLAVIX) 75 MG tablet Take 75 mg by mouth daily.    . Ferrous Sulfate (IRON) 325 (65 Fe) MG TABS Take 1 tablet by mouth daily.    . folic acid (FOLVITE) 1 MG tablet Take 1 mg by mouth daily.    . furosemide (LASIX) 20 MG tablet Take 20 mg by mouth daily as needed (swelling).     . isosorbide mononitrate (IMDUR) 30 MG 24 hr tablet Take 1 tablet (30 mg total) by mouth daily. 90 tablet 3  . levothyroxine (SYNTHROID, LEVOTHROID) 75 MCG tablet TAKE 1 TABLET EVERY DAY  (NEW  DOSE) 90 tablet 2  . Magnesium 250 MG TABS Take 1 tablet by mouth daily.    . Melatonin 10 MG TABS Take 20 mg by mouth at bedtime.    . metoprolol tartrate (LOPRESSOR) 25 MG tablet Take 25 mg by mouth 2 (two) times daily.    . nitroGLYCERIN (NITROSTAT) 0.4 MG SL tablet Place 0.4 mg under the tongue every 5 (five) minutes as needed for chest pain.    . pramipexole (MIRAPEX) 1 MG tablet Take 1.5 mg by mouth daily.     . sertraline (ZOLOFT) 100  MG tablet Take 100 mg by mouth every other day.    . traZODone (DESYREL) 100 MG tablet Take 100 mg by mouth 2 (two) times daily.     No current facility-administered medications for this visit.      Past Medical History:  Diagnosis Date  . Anxiety   . Arthritis    "all over" (07/29/2017)  . Bulging disc    "still have one in my neck" (07/29/2017)  . Carotid arterial disease (HCC)    a. s/p L intracranial ICA stenting ([facility]).  . Cataract    "left eye; didn't take it out cause of macular degeneration" (07/29/2017)  . Cervical cancer (HCC)    hx  . Chronic kidney disease, stage III (moderate) ([place])    "only have 1 working kidney" (07/29/2017)  . COPD (chronic obstructive pulmonary disease) ([place])    a. quit smoking in 2007.  Marland Kitchen Coronary artery disease    a. 2007 s/p MI and LAD stenting;  10/2006 CABG x 4: LIMA->LAD, VG->OM->dLCX, VG->RPL;  c. 07/2013 MV: no ischemia.  . Depression   . Esophageal reflux   . Hepatitis 1968   "  don't remember which one"  . History of blood transfusion 10/29/2006   "related to bypass OR"  . History of echocardiogram    a. 01/2012 Echo: EF 55%, mildly dil LA/RV, mild MR, Ao Sclerosis, mod TR.  Marland Kitchen Hypothyroid   . Insomnia   . Macular degeneration    "left eye" (07/29/2017)  . Mixed hyperlipidemia   . NSTEMI (non-ST elevated myocardial infarction) ([place]) 08/2006; 10/2006  . Osteoporosis   . Pneumonia    "several times before I quit smoking" (07/29/2017)  . Presence of permanent cardiac pacemaker   . PVT (paroxysmal ventricular tachycardia) ([place])   . Restless leg syndrome   . Stroke [facility]) ?2008   denies residual on 07/29/2017  . Symptomatic bradycardia    a. s/p BSX Advantio dual chamber PPM Beckie Salts).  . Syncope and collapse   . Unspecified hearing loss     ROS:   All systems reviewed and negative except as noted in the HPI.   Past Surgical History:  Procedure Laterality Date  . ABDOMINAL HYSTERECTOMY  1970s   "partial"  . ANTERIOR  CERVICAL DECOMP/DISCECTOMY FUSION  03/2001; 04/2005;?  date for 3rd OR   "used bone from my hips each time"  . BACK SURGERY    . CARDIAC CATHETERIZATION  2008  . CATARACT EXTRACTION W/ INTRAOCULAR LENS IMPLANT Right 05/2013  . CEREBRAL ANGIOGRAM Left 02/14/2007   w/ICA stent/notes 11/04/2010  . COLONOSCOPY    . CORONARY ANGIOPLASTY WITH STENT PLACEMENT  08/2006   "2 stents"  . CORONARY ARTERY BYPASS GRAFT  10/2006   "CABG X4"  . EYE SURGERY  05/2013   cataract removed  . INSERT / REPLACE / REMOVE PACEMAKER  03/2012   [place], [place]; [employer]  . KNEE ARTHROSCOPY Bilateral X 7   "4 on my left; 3 on my right" (07/29/2017)  . LAPAROSCOPIC CHOLECYSTECTOMY    . TUBAL LIGATION       Family History  Problem Relation Age of Onset  . Stomach cancer Mother   . Diabetes Mother   . Hyperlipidemia Mother   . Hypertension Father   . Lung cancer Father   . Alcohol abuse Father   . Early death Brother      Social History   Socioeconomic History  . Marital status: Widowed    Spouse name: Not on file  . Number of children: Not on file  . Years of education: Not on file  . Highest education level: Not on file  Occupational History  . 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 Financial resource strain: Not on file  . Food insecurity:    Worry: Not on file    Inability: Not on file  . Transportation needs:    Medical: Not on file    Non-medical: Not on file  Tobacco Use  . Smoking status: Former Smoker    Packs/day: 0.75    Years: 40.00    Pack years: 30.00    Types: Cigarettes    Start date: 09/28/1965    Last attempt to quit: 05/27/2006    Years since quitting: 12.2  . Smokeless tobacco: Never Used  Substance and Sexual Activity  . Alcohol use: Yes    Alcohol/week: 1.0 standard drinks    Types: 1 Glasses of wine per week  . Drug use: No  . Sexual activity: Never  Lifestyle  . Physical activity:    Days per week: Not on file  Minutes per session: Not on file  . Stress: Not on file  Relationships  . Social connections:    Talks on phone: Not on file    Gets together: Not on file    Attends religious service: Not on file    Active member of club or organization: Not on file    Attends meetings of clubs or organizations: Not on file    Relationship status: Not on file  . Intimate partner violence:    Fear of current or ex partner: Not on file    Emotionally abused: Not on file    Physically abused: Not on file    Forced sexual activity: Not on file  Other Topics Concern  . Not on file  Social History Narrative   Lives in independent section of a retirement community.  Does not routinely exercise.     BP (!) 144/76   Pulse 74   Ht 5\' 1"  (1.549 m)   Wt 225 lb (102.1 kg)   BMI 42.51 kg/m   Physical Exam:  Well appearing 72 yo woman, overweight, NAD HEENT: Unremarkable Neck:  6 cm JVD, no thyromegally Lymphatics:  No adenopathy Back:  No CVA tenderness Lungs:  Clear with no wheezes HEART:  Regular rate rhythm, no murmurs, no rubs, no clicks Abd:  soft, positive bowel sounds, no organomegally, no rebound, no guarding Ext:  2 plus pulses, no edema, no cyanosis, no clubbing Skin:  No rashes no nodules Neuro:  CN II through XII intact, motor grossly intact  EKG - none  DEVICE  Normal device function.  See PaceArt for details.   Assess/Plan: 1. CAD - she is s/p remote PCI, CABG. She will stop her plaix and continue low dose ASA. She c/o bruising and would like to come off and there is very little data to suggest a benefit at this time.  2. Obesity - her weight is continuing to go up. She is encouraged to eat less. 3. Sinus node dysfunction - she is asymptomatic,s/p PPM insertion.  Mikle Bosworth.D.

## 2018-09-16 LAB — CUP PACEART INCLINIC DEVICE CHECK
Date Time Interrogation Session: 20191221174859
Implantable Lead Implant Date: 20130611
Implantable Lead Location: 753859
Implantable Lead Location: 753860
Implantable Lead Model: 4469
Implantable Lead Serial Number: 566580
Implantable Pulse Generator Implant Date: 20130611
Lead Channel Setting Sensing Sensitivity: 2.5 mV
MDC IDC LEAD IMPLANT DT: 20130611
MDC IDC PG SERIAL: 131914
MDC IDC SET LEADCHNL RA PACING AMPLITUDE: 2 V
MDC IDC SET LEADCHNL RV PACING AMPLITUDE: 2.4 V
MDC IDC SET LEADCHNL RV PACING PULSEWIDTH: 0.4 ms

## 2018-09-29 LAB — CUP PACEART REMOTE DEVICE CHECK
Date Time Interrogation Session: 20200103193856
Implantable Lead Implant Date: 20130611
Implantable Lead Location: 753860
Implantable Lead Model: 4469
Implantable Lead Serial Number: 566580
MDC IDC LEAD IMPLANT DT: 20130611
MDC IDC LEAD LOCATION: 753859
MDC IDC PG IMPLANT DT: 20130611
MDC IDC PG SERIAL: 131914

## 2018-10-23 ENCOUNTER — Ambulatory Visit: Payer: Medicare HMO | Admitting: Neurology

## 2018-10-23 ENCOUNTER — Encounter: Payer: Self-pay | Admitting: Neurology

## 2018-10-23 ENCOUNTER — Other Ambulatory Visit: Payer: Self-pay

## 2018-10-23 ENCOUNTER — Encounter

## 2018-10-23 VITALS — BP 136/82 | HR 74 | Ht 61.0 in | Wt 226.0 lb

## 2018-10-23 DIAGNOSIS — R404 Transient alteration of awareness: Secondary | ICD-10-CM

## 2018-10-23 DIAGNOSIS — R55 Syncope and collapse: Secondary | ICD-10-CM

## 2018-10-23 NOTE — Patient Instructions (Addendum)
1. Schedule head CT without contrast  We have sent a referral to [facility] for your CT and they will call you directly to schedule your appt. They are located at [place]. If you need to contact them directly please call [phone].  2. Schedule 1-hour EEG. If normal, we will proceed with a 24-hour EEG 3. Continue follow-up with your PCP and cardiologist 4. Follow-up after tests   Once you have your CT appointment scheduled, please call our office to coordinate EEG for the same day
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
## 2018-10-30 ENCOUNTER — Ambulatory Visit (INDEPENDENT_AMBULATORY_CARE_PROVIDER_SITE_OTHER): Payer: Medicare HMO

## 2018-10-30 DIAGNOSIS — I495 Sick sinus syndrome: Secondary | ICD-10-CM | POA: Diagnosis not present

## 2018-10-30 DIAGNOSIS — R55 Syncope and collapse: Secondary | ICD-10-CM

## 2018-11-02 ENCOUNTER — Ambulatory Visit (INDEPENDENT_AMBULATORY_CARE_PROVIDER_SITE_OTHER): Payer: Medicare HMO | Admitting: Neurology

## 2018-11-02 ENCOUNTER — Ambulatory Visit
Admission: RE | Admit: 2018-11-02 | Discharge: 2018-11-02 | Disposition: A | Discharge status: Home / Self Care | Payer: Medicare HMO | Source: Ambulatory Visit | Attending: Neurology | Admitting: Neurology

## 2018-11-02 DIAGNOSIS — R55 Syncope and collapse: Secondary | ICD-10-CM | POA: Diagnosis not present

## 2018-11-02 DIAGNOSIS — R404 Transient alteration of awareness: Secondary | ICD-10-CM

## 2018-11-02 LAB — CUP PACEART REMOTE DEVICE CHECK
Battery Remaining Percentage: 55 %
Implantable Lead Implant Date: 20130611
Implantable Lead Location: 753860
Implantable Lead Model: 4469
Implantable Lead Model: 5076
Implantable Pulse Generator Implant Date: 20130611
Lead Channel Impedance Value: 403 Ohm
Lead Channel Impedance Value: 626 Ohm
Lead Channel Pacing Threshold Amplitude: 0.5 V
Lead Channel Setting Pacing Amplitude: 2.4 V
MDC IDC LEAD IMPLANT DT: 20130611
MDC IDC LEAD LOCATION: 753859
MDC IDC LEAD SERIAL: 566580
MDC IDC MSMT BATTERY REMAINING LONGEVITY: 36 mo
MDC IDC MSMT LEADCHNL RA PACING THRESHOLD PULSEWIDTH: 0.4 ms
MDC IDC PG SERIAL: 131914
MDC IDC SESS DTM: 20200203080500
MDC IDC SET LEADCHNL RA PACING AMPLITUDE: 2 V
MDC IDC SET LEADCHNL RV PACING PULSEWIDTH: 0.4 ms
MDC IDC SET LEADCHNL RV SENSING SENSITIVITY: 2.5 mV
MDC IDC STAT BRADY RA PERCENT PACED: 97 %
MDC IDC STAT BRADY RV PERCENT PACED: 7 %

## 2018-11-03 NOTE — Procedures (Signed)
ELECTROENCEPHALOGRAM REPORT  Date of Study: 11/02/2018  Patient's Name: [name] MRN: [id number] Date of Birth: 04/26/1946  Referring Provider: Dr. Ellouise Newer  Clinical History: This is a 73 year old woman with recurrent syncope.  Medications: PROVENTIL HFA;VENTOLIN HFA 108 (90 Base) MCG/ACT inhaler  aspirin 81 MG tablet  LIPITOR 20 MG tablet  IRON 325 (65 Fe) MG TABS   FOLVITE 1 MG tablet  LASIX 20 MG tablet    IMDUR 30 MG 24 hr tablet  SYNTHROID, LEVOTHROID 75 MCG tablet  Melatonin 10 MG TABS  LOPRESSOR 25 MG tablet   NITROSTAT 0.4 MG SL tablet   MIRAPEX 1 MG tablet  ZOLOFT 100 MG tablet   DESYREL 100 MG tablet   Technical Summary: A multichannel digital 1-hour EEG recording measured by the international 10-20 system with electrodes applied with paste and impedances below 5000 ohms performed in our laboratory with EKG monitoring in an awake and asleep patient.  Hyperventilation was not performed. Photic stimulation was performed.  The digital EEG was referentially recorded, reformatted, and digitally filtered in a variety of bipolar and referential montages for optimal display.    Description: [name] awake and asleep during the recording.  During maximal wakefulness, there is a symmetric, medium voltage 10 Hz posterior dominant rhythm that attenuates with eye opening.  The record is symmetric.  During drowsiness and sleep, there is an increase in theta slowing of the background.  Vertex waves and symmetric sleep spindles were seen.  Photic stimulation did not elicit any abnormalities.  There were no epileptiform discharges or electrographic seizures seen.    EKG lead was unremarkable.  Impression: This 1-hour awake and asleep EEG is normal.    Clinical Correlation: A normal EEG does not exclude a clinical diagnosis of epilepsy.  If further clinical questions remain, prolonged EEG may be helpful.  Clinical correlation is advised.   Ellouise Newer,  M.D.

## 2018-11-07 ENCOUNTER — Telehealth: Payer: Self-pay

## 2018-11-07 NOTE — Progress Notes (Signed)
Remote pacemaker transmission.   

## 2018-11-07 NOTE — Telephone Encounter (Signed)
-----   Message from Cameron Sprang, MD sent at 11/03/2018  4:42 PM EST ----- Pls let her know along with EEG results that the head CT did not show any new changes. No evidence of tumor, stroke, or bleed. Thanks

## 2018-11-07 NOTE — Telephone Encounter (Signed)
LMOM advising of normal EEG and CT results.

## 2018-11-08 ENCOUNTER — Ambulatory Visit (INDEPENDENT_AMBULATORY_CARE_PROVIDER_SITE_OTHER): Payer: Medicare HMO | Admitting: Neurology

## 2018-11-08 DIAGNOSIS — R55 Syncope and collapse: Secondary | ICD-10-CM | POA: Diagnosis not present

## 2018-11-08 DIAGNOSIS — R404 Transient alteration of awareness: Secondary | ICD-10-CM

## 2018-11-17 NOTE — Procedures (Signed)
ELECTROENCEPHALOGRAM REPORT  Dates of Recording: 11/08/2018 11:28AM to 11/09/2018 11:54AM  Patient's Name: [name] MRN: [id number] Date of Birth: 10/22/1945  Referring Provider: Dr. Ellouise Newer  Procedure: 24-hour ambulatory EEG  History: This is a 73 year old woman with recurrent syncope.   Medications:  PROVENTIL HFA;VENTOLIN HFA 108 (90 Base) MCG/ACT inhaler  aspirin 81 MG tablet  LIPITOR 20 MG tablet  IRON 325 (65 Fe) MG TABS   FOLVITE 1 MG tablet  LASIX 20 MG tablet    IMDUR 30 MG 24 hr tablet  SYNTHROID, LEVOTHROID 75 MCG tablet  Melatonin 10 MG TABS  LOPRESSOR 25 MG tablet   NITROSTAT 0.4 MG SL tablet   MIRAPEX 1 MG tablet  ZOLOFT 100 MG tablet   DESYREL 100 MG tablet   Technical Summary: This is a 24-hour multichannel digital EEG recording measured by the international 10-20 system with electrodes applied with paste and impedances below 5000 ohms performed as portable with EKG monitoring.  The digital EEG was referentially recorded, reformatted, and digitally filtered in a variety of bipolar and referential montages for optimal display.    DESCRIPTION OF RECORDING: During maximal wakefulness, the background activity consisted of a symmetric 10 Hz posterior dominant rhythm which was reactive to eye opening.  There were no epileptiform discharges or focal slowing seen in wakefulness.  During the recording, the patient progresses through wakefulness, drowsiness, and Stage 2 sleep.  Again, there were no epileptiform discharges seen.  Events: There were no push button events.  There were no electrographic seizures seen.  EKG lead was unremarkable.  IMPRESSION: This 24-hour ambulatory EEG study is normal.    CLINICAL CORRELATION: A normal EEG does not exclude a clinical diagnosis of epilepsy. Typical events were not captured. If further clinical questions remain, inpatient video EEG monitoring may be helpful.   Ellouise Newer, M.D.

## 2018-11-18 IMAGING — CT CT CERVICAL SPINE W/O CM
5 of 8 series · 14 of 33 positions shown, 15 images · non-contrast
Comparison: Head CT dated 07/18/2017

CLINICAL DATA: 71-year-old female with possible fall.

EXAM:
CT HEAD WITHOUT CONTRAST
CT CERVICAL SPINE WITHOUT CONTRAST
TECHNIQUE: Multidetector CT imaging of the head and cervical spine was
performed following the standard protocol without intravenous
contrast. Multiplanar CT image reconstructions of the cervical spine
were also generated.

[Series 5: head bone · axial · 0.39mm/px · z∈[-60,-8]mm · 2 of 78 slices shown]
[im 26/78  bone]
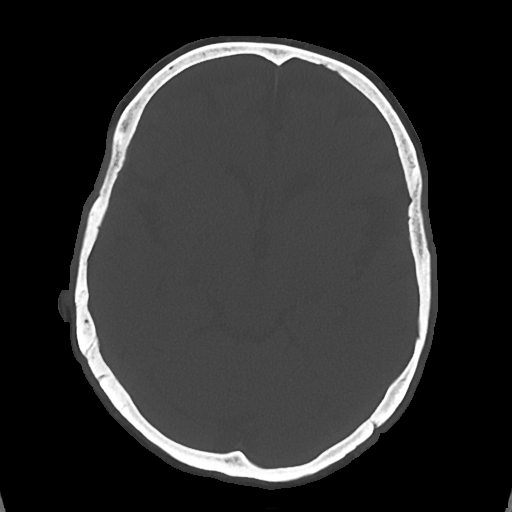
[im 52/78  bone]
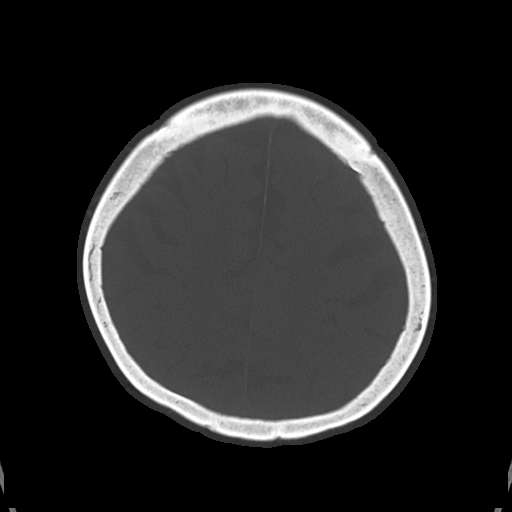

[Series 6: cor soft · coronal · 0.30mm/px · 3 of 67 slices shown]
[im 17/67  bone]
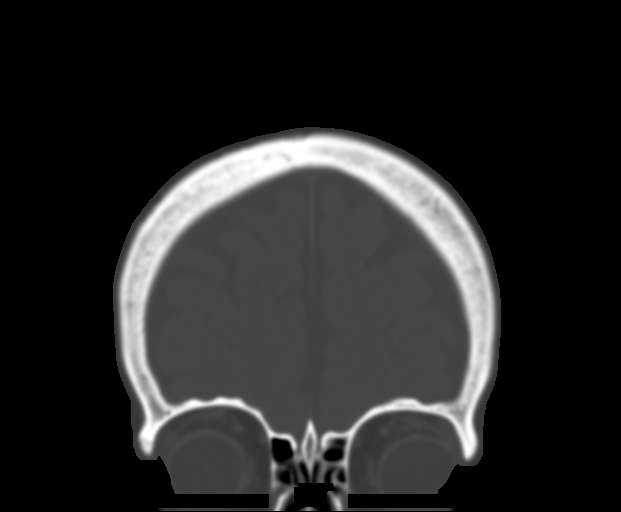
[im 34/67  bone]
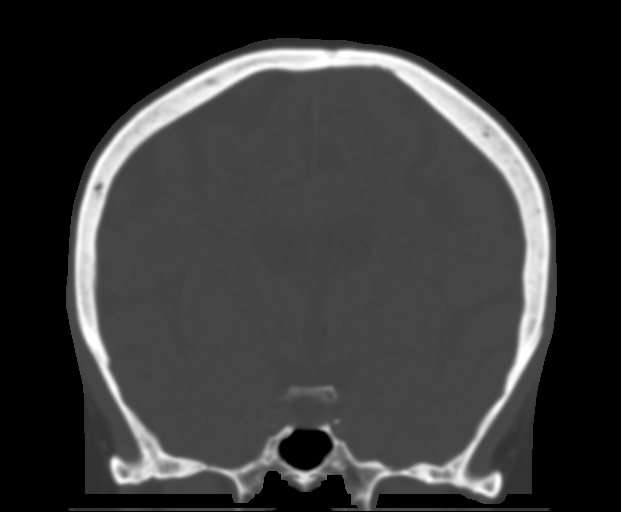
[im 50/67  bone]
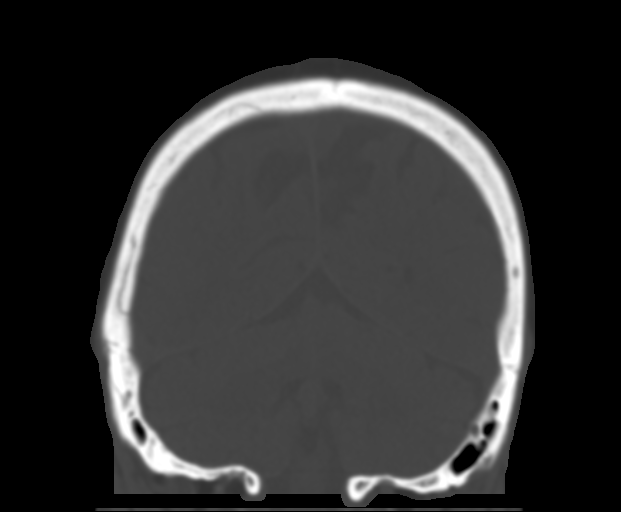

[Series 9: c spine soft · axial · 0.27mm/px · z∈[-182,-130]mm · 2 of 80 slices shown]
[im 27/80  soft-tissue]
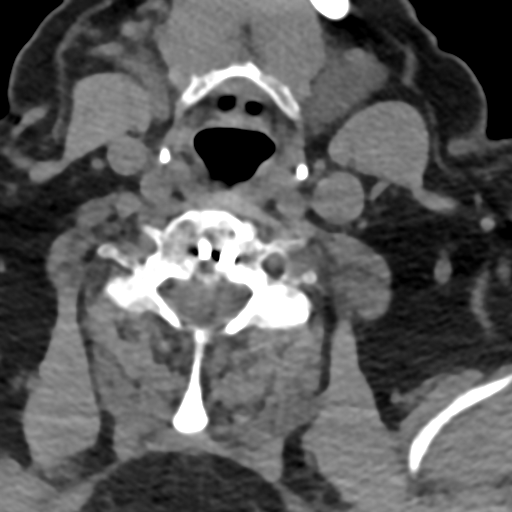
[im 53/80  soft-tissue]
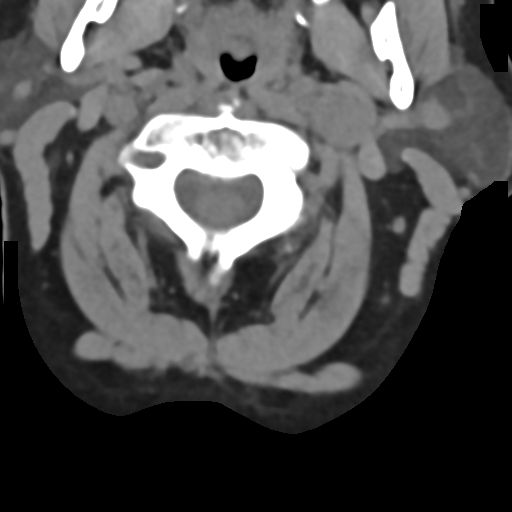

[Series 10: sag bone · sagittal · 0.31mm/px · 5 of 61 slices shown]
[im 11/61  bone]
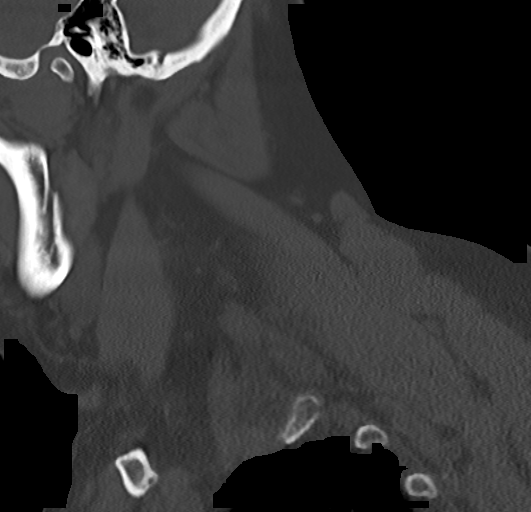
[im 21/61  bone]
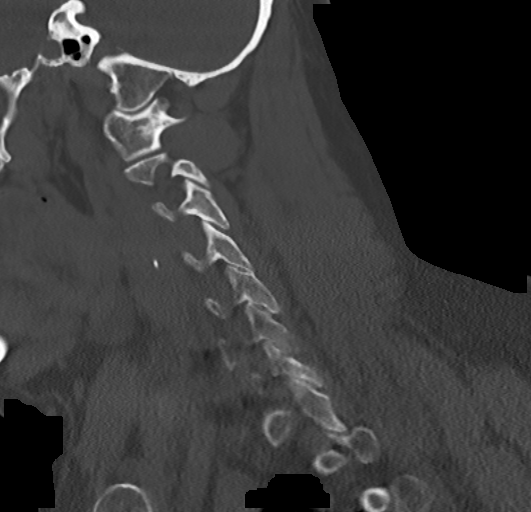
[im 31/61  bone]
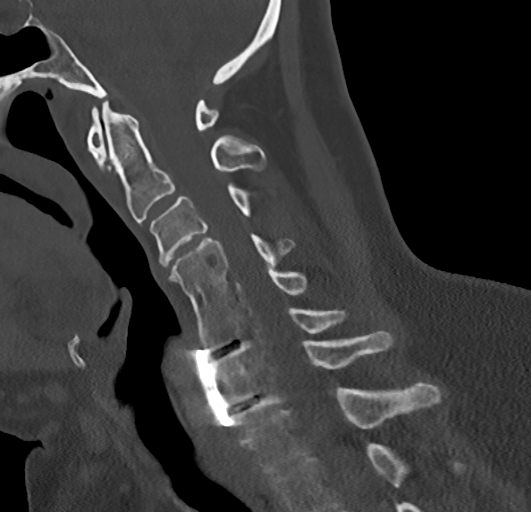
[im 41/61  bone]
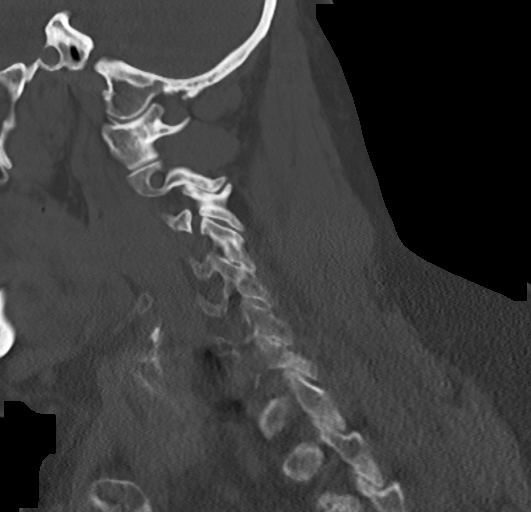
[im 51/61  bone]
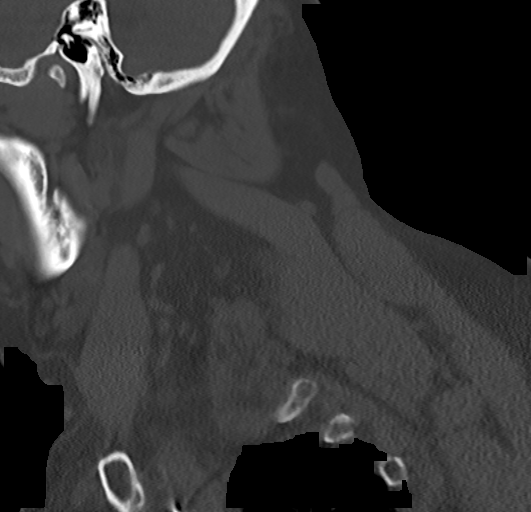

[Series 12: orthogonal axials · axial · 0.21mm/px · z∈[-204,-159]mm · 2 of 84 slices shown, 3 images]
[im 28/84  soft-tissue]
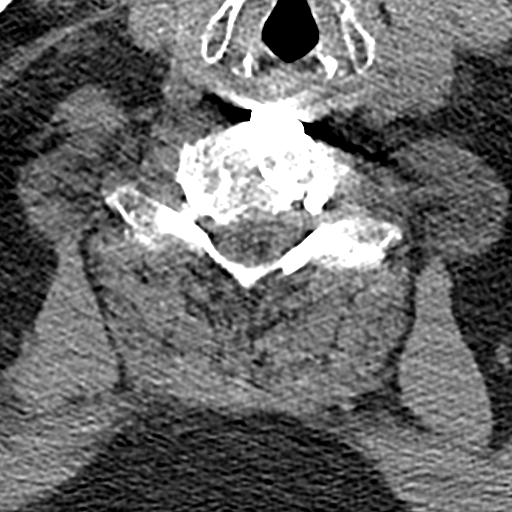
[im 28/84  bone]
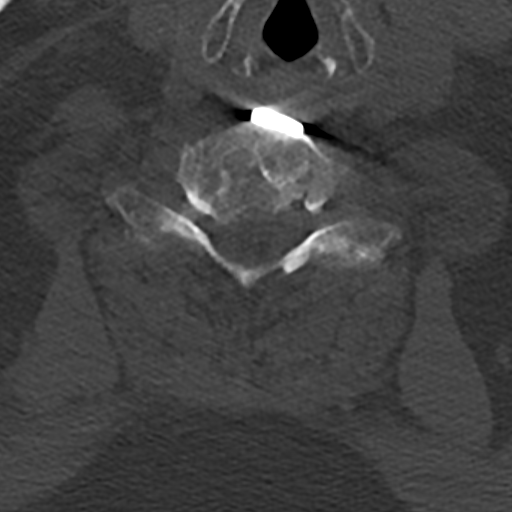
[im 56/84  bone]
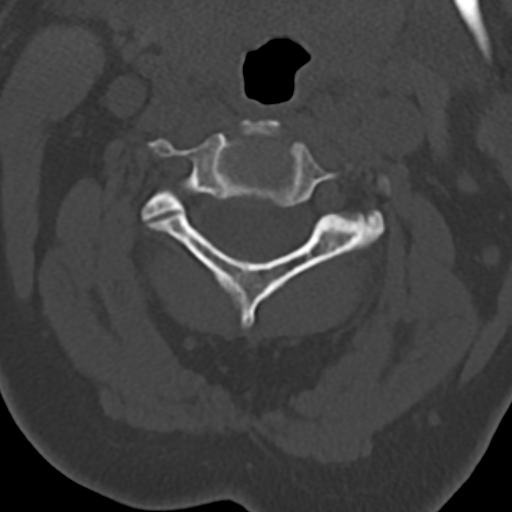

[14 of 33 positions shown; findings below may reference images not displayed]

FINDINGS: CT HEAD FINDINGS

Brain: There is mild age-related atrophy and chronic microvascular
ischemic changes. There is no acute intracranial hemorrhage. No mass
effect or midline shift noted. No extra-axial fluid collection.

Vascular: No hyperdense vessel or unexpected calcification. Left MCA
stent as seen on the prior CT. Evaluation of the state is limited on
this noncontrast CT.

Skull:  No acute calvarial pathology.

Sinuses/Orbits: There is mucoperiosteal thickening of the right
maxillary sinus. The remainder of the visualized paranasal sinuses
and mastoid air cells are clear.

Other: None

CT CERVICAL SPINE FINDINGS

Alignment: No acute subluxation.

Skull base and vertebrae: No acute fracture. Advanced osteopenia
which limits evaluation for fracture.

Soft tissues and spinal canal: No prevertebral fluid or swelling. No
visible canal hematoma.

Disc levels: There is bony fusion of C4-C7. C6-C7 anterior fusion
plate and screws noted. The fixation plate and screws appear intact.

Upper chest: Centrilobular emphysema.

Other: Partially visualized pacemaker wires in the left upper chest.
IMPRESSION: 1. No acute intracranial hemorrhage.
2. Age-related atrophy and chronic microvascular ischemic changes.
Left MCA stent appears in similar position as prior CT.
3. No acute/traumatic cervical spine pathology.
4. Advanced osteopenia of the cervical spine with fusion of C4-C7.

## 2018-11-20 ENCOUNTER — Telehealth: Payer: Self-pay | Admitting: Neurology

## 2018-11-20 MED ORDER — ESLICARBAZEPINE ACETATE 200 MG PO TABS: ORAL_TABLET | ORAL | 5 refills | Status: DC

## 2018-11-20 NOTE — Telephone Encounter (Signed)
Discussed normal 24-hour EEG with patient, however typical events were not captured. Concern for seizures is still high, she continues to note gaps in time. We discussed doing a therapeutic trial with a seizure medication and keeping a calendar of symptoms as she starts medication. She is agreeable to starting medication. She only has 1 kidney "working at 33%." Discussed starting Aptiom 200mg  qhs x 2 weeks, then increase to 400mg  qhs. If cost-prohibitive, discussed that we will need to be on a medication that is taken BID. She will let us know if too costly. F/u in 3 months.

## 2018-11-21 ENCOUNTER — Telehealth: Payer: Self-pay | Admitting: Neurology

## 2018-11-21 NOTE — Telephone Encounter (Signed)
Patient called and is needing to let Dr. Delice Lesch know that the Seizure medication that was for 1 x a day will cost her $752.00 a month. She is asking if you can call in for her the Seizure medication that she takes 2 x a day? She said she will see what that will cost her. Thanks

## 2018-11-22 NOTE — Telephone Encounter (Signed)
pls advise  Pt currently taking Aptiom

## 2018-11-23 MED ORDER — OXCARBAZEPINE 300 MG PO TABS: ORAL_TABLET | ORAL | 5 refills | Status: DC

## 2018-11-23 NOTE — Telephone Encounter (Signed)
Pls send in Rx for oxcarbazepine 300mg : take 1/2 tablet twice a day for 1 week, then increase to 1 tab twice a day. Thanks

## 2018-11-23 NOTE — Addendum Note (Signed)
Addended by: Lenny Pastel on: 11/23/2018 03:30 PM   Modules accepted: Orders

## 2018-11-23 NOTE — Telephone Encounter (Signed)
Oxcarbazpepine 300mg  #60 with 5 refills Sig = take half tab BID x2weeks, the full tab BID  Sent to [facility] on [address].

## 2018-11-27 ENCOUNTER — Telehealth: Payer: Self-pay | Admitting: Neurology

## 2018-11-27 ENCOUNTER — Other Ambulatory Visit: Payer: Self-pay

## 2018-11-27 NOTE — Telephone Encounter (Signed)
Patient states that she is having some spells and her seizures medication. She also needs to have her Trazodone and her pramipexole  Called into to the [facility]

## 2018-11-28 NOTE — Telephone Encounter (Signed)
Spoke with pt advising that Dr. Delice Lesch does not prescribe her Trazodone or her Pramipexole.  Pt apologized stating that she must have gotten her providers mixed up.    Pt states that she called office last week to report that Aptiom was not cost-effective and that nothing was done.  I advised that Rx for Oxcarbazepine was sent to [facility] on [address] on February 25. Advised pt of instructions for titration.    Pt goes on to state that for the last 3 nights she has woken up at 2AM and has to walk around her apartment for at least an hour.  States that she feels as though she has "hairs or spider web all over".  States that this must be nerve related and she wants to know what to do.  I advised that she start taking Oxcarbazepine as this may quiet things down.  Asked that she take for a minimum of 3 days before calling office with update.   Dr. Delice Lesch - Juluis Rainier

## 2018-12-05 ENCOUNTER — Telehealth: Payer: Self-pay | Admitting: Neurology

## 2018-12-05 NOTE — Telephone Encounter (Signed)
Patient called and would like to speak to someone about her RLS. She states that her legs are waking her up at night due to the pain and she needs something done about this please call

## 2018-12-06 NOTE — Telephone Encounter (Signed)
Patient given information and instructed to call the ordering doctor.

## 2018-12-06 NOTE — Telephone Encounter (Signed)
She is on a pretty high dose of Mirapex. I saw her for the seizures, we did not address the restless leg. Pls let her know to talk to the physician prescribing the Mirapex. Thanks

## 2018-12-06 NOTE — Telephone Encounter (Signed)
Can her med be increased?

## 2019-01-04 ENCOUNTER — Encounter: Payer: Self-pay | Admitting: *Deleted

## 2019-01-04 ENCOUNTER — Other Ambulatory Visit: Payer: Self-pay

## 2019-01-04 ENCOUNTER — Telehealth (INDEPENDENT_AMBULATORY_CARE_PROVIDER_SITE_OTHER): Payer: Medicare HMO | Admitting: Neurology

## 2019-01-04 DIAGNOSIS — R55 Syncope and collapse: Secondary | ICD-10-CM | POA: Diagnosis not present

## 2019-01-04 DIAGNOSIS — G2581 Restless legs syndrome: Secondary | ICD-10-CM

## 2019-01-04 DIAGNOSIS — R404 Transient alteration of awareness: Secondary | ICD-10-CM

## 2019-01-04 MED ORDER — OXCARBAZEPINE 300 MG PO TABS: ORAL_TABLET | ORAL | 3 refills | Status: DC

## 2019-01-04 MED ORDER — PRAMIPEXOLE DIHYDROCHLORIDE 1 MG PO TABS: ORAL_TABLET | ORAL | 3 refills | Status: DC

## 2019-01-04 MED ORDER — CLONAZEPAM 0.5 MG PO TABS: ORAL_TABLET | ORAL | 3 refills | Status: DC

## 2019-01-04 NOTE — Patient Instructions (Signed)
Great seeing you even if it was online!  I wanted to send you a note to make sure instructions are clear:  1. Increase the oxcarbazepine 300mg : Take 1 tablet twice a day. Please keep a calendar of the gaps in time.  2. Reduce the Pramipexole 1mg : Take 1 tablet every night and continue on this dose until I see you in follow-up  3. Start clonazepam 0.5mg : Take 1/2 tablet 45 minutes prior to bedtime  4. Call our office for an update a week after starting the clonazepam, we may increase dose if restless leg still very bothersome  5. Our office will contact you to schedule follow-up in-person (fingers crossed!) in 3-4 months, call for any problems

## 2019-01-04 NOTE — Progress Notes (Signed)
I will mail AVS.
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
## 2019-01-08 ENCOUNTER — Telehealth: Payer: Medicare HMO | Admitting: Physician Assistant

## 2019-01-08 DIAGNOSIS — R21 Rash and other nonspecific skin eruption: Secondary | ICD-10-CM

## 2019-01-08 NOTE — Progress Notes (Signed)
Based on what you shared with me, I feel your condition warrants further evaluation and I recommend that you be seen for a face to face office visit.     NOTE: If you entered your credit card information for this eVisit, you will not be charged. You may see a "hold" on your card for the $35 but that hold will drop off and you will not have a charge processed.  If you are having a true medical emergency please call 911.  If you need an urgent face to face visit, [facility] has four urgent care centers for your convenience.    PLEASE NOTE: THE [facility] AND URGENT CARE CLINICS DO NOT HAVE THE TESTING FOR CORONAVIRUS COVID19 AVAILABLE.  IF YOU FEEL YOU NEED THIS TEST YOU MUST GO TO A TRIAGE LOCATION AT ONE OF THE HOSPITAL EMERGENCY DEPARTMENTS   [email or web address] to reserve your spot online an avoid wait times  [facility] [address], [address] [place], [place] [address] Modified hours of operation: Monday-Friday, 10 AM to 6 PM  Saturday & Sunday 10 AM to 4 PM *Across the street from [employer]  [facility] (New Address!) [address], [address] [place], [place] [address] *Just off [address], across the road from [employer]* Modified hours of operation: Monday-Friday, 10 AM to 5 PM  Closed Saturday & Sunday   The following sites will take your insurance:  . [facility]  [phone] Get Driving Directions Find a Provider at this Location  [address] [place], [place] [address] . 10 am to 8 pm Monday-Friday . 12 pm to 8 pm Saturday-Sunday   . [facility]  [phone] Get Driving Directions Find a Provider at this Location  [address], [address] [place], [place] [address] . 8 am to 8 pm Monday-Friday . 9 am to 6 pm Saturday . 11 am to 6 pm Sunday   . [facility] [place]  [phone] Get Driving Directions  [address] [place], [place] [address] . 8 am to 8 pm Monday-Friday . 8 am to 4 pm Saturday-Sunday   Your e-visit answers were reviewed by a board certified advanced clinical practitioner to complete your personal care plan.  Thank you for using e-Visits. 

## 2019-01-14 ENCOUNTER — Emergency Department (HOSPITAL_COMMUNITY)
Admission: EM | Admit: 2019-01-14 | Discharge: 2019-01-14 | Disposition: A | Discharge status: Home / Self Care | Payer: Medicare HMO | Attending: Emergency Medicine | Admitting: Emergency Medicine

## 2019-01-14 ENCOUNTER — Emergency Department (HOSPITAL_COMMUNITY): Payer: Medicare HMO

## 2019-01-14 ENCOUNTER — Other Ambulatory Visit: Payer: Self-pay

## 2019-01-14 ENCOUNTER — Encounter (HOSPITAL_COMMUNITY): Payer: Self-pay | Admitting: Emergency Medicine

## 2019-01-14 DIAGNOSIS — J449 Chronic obstructive pulmonary disease, unspecified: Secondary | ICD-10-CM | POA: Insufficient documentation

## 2019-01-14 DIAGNOSIS — Z951 Presence of aortocoronary bypass graft: Secondary | ICD-10-CM | POA: Insufficient documentation

## 2019-01-14 DIAGNOSIS — Z8673 Personal history of transient ischemic attack (TIA), and cerebral infarction without residual deficits: Secondary | ICD-10-CM | POA: Insufficient documentation

## 2019-01-14 DIAGNOSIS — I5032 Chronic diastolic (congestive) heart failure: Secondary | ICD-10-CM | POA: Insufficient documentation

## 2019-01-14 DIAGNOSIS — N189 Chronic kidney disease, unspecified: Secondary | ICD-10-CM | POA: Insufficient documentation

## 2019-01-14 DIAGNOSIS — Z87891 Personal history of nicotine dependence: Secondary | ICD-10-CM | POA: Diagnosis not present

## 2019-01-14 DIAGNOSIS — R600 Localized edema: Secondary | ICD-10-CM

## 2019-01-14 DIAGNOSIS — Z79899 Other long term (current) drug therapy: Secondary | ICD-10-CM | POA: Diagnosis not present

## 2019-01-14 DIAGNOSIS — Z7982 Long term (current) use of aspirin: Secondary | ICD-10-CM | POA: Diagnosis not present

## 2019-01-14 DIAGNOSIS — I130 Hypertensive heart and chronic kidney disease with heart failure and stage 1 through stage 4 chronic kidney disease, or unspecified chronic kidney disease: Secondary | ICD-10-CM | POA: Diagnosis not present

## 2019-01-14 DIAGNOSIS — Z8541 Personal history of malignant neoplasm of cervix uteri: Secondary | ICD-10-CM | POA: Insufficient documentation

## 2019-01-14 DIAGNOSIS — R21 Rash and other nonspecific skin eruption: Secondary | ICD-10-CM | POA: Insufficient documentation

## 2019-01-14 LAB — CBC WITH DIFFERENTIAL/PLATELET
Abs Immature Granulocytes: 0.03 10*3/uL (ref 0.00–0.07)
Basophils Absolute: 0 10*3/uL (ref 0.0–0.1)
Basophils Relative: 0 %
Eosinophils Absolute: 0.2 10*3/uL (ref 0.0–0.5)
Eosinophils Relative: 3 %
HCT: 41.3 % (ref 36.0–46.0)
Hemoglobin: 12.5 g/dL (ref 12.0–15.0)
Immature Granulocytes: 0 %
Lymphocytes Relative: 20 %
Lymphs Abs: 1.7 10*3/uL (ref 0.7–4.0)
MCH: 27.2 pg (ref 26.0–34.0)
MCHC: 30.3 g/dL (ref 30.0–36.0)
MCV: 89.8 fL (ref 80.0–100.0)
Monocytes Absolute: 1.2 10*3/uL — ABNORMAL HIGH (ref 0.1–1.0)
Monocytes Relative: 14 %
Neutro Abs: 5.4 10*3/uL (ref 1.7–7.7)
Neutrophils Relative %: 63 %
Platelets: 157 10*3/uL (ref 150–400)
RBC: 4.6 MIL/uL (ref 3.87–5.11)
RDW: 13.2 % (ref 11.5–15.5)
WBC: 8.6 10*3/uL (ref 4.0–10.5)
nRBC: 0 % (ref 0.0–0.2)

## 2019-01-14 LAB — BASIC METABOLIC PANEL
Anion gap: 9 (ref 5–15)
BUN: 26 mg/dL — ABNORMAL HIGH (ref 8–23)
CO2: 29 mmol/L (ref 22–32)
Calcium: 8.8 mg/dL — ABNORMAL LOW (ref 8.9–10.3)
Chloride: 101 mmol/L (ref 98–111)
Creatinine, Ser: 1.61 mg/dL — ABNORMAL HIGH (ref 0.44–1.00)
GFR calc Af Amer: 36 mL/min — ABNORMAL LOW (ref >60)
GFR calc non Af Amer: 31 mL/min — ABNORMAL LOW (ref >60)
Glucose, Bld: 65 mg/dL — ABNORMAL LOW (ref 70–99)
Potassium: 4.1 mmol/L (ref 3.5–5.1)
Sodium: 139 mmol/L (ref 135–145)

## 2019-01-14 LAB — BRAIN NATRIURETIC PEPTIDE: B Natriuretic Peptide: 194.8 pg/mL — ABNORMAL HIGH (ref 0.0–100.0)

## 2019-01-14 MED ORDER — ACETAMINOPHEN 500 MG PO TABS
1000.0000 mg | ORAL_TABLET | Freq: Once | ORAL | Status: DC
  Filled 2019-01-14: qty 2

## 2019-01-14 MED ORDER — DIPHENHYDRAMINE HCL 25 MG PO CAPS
25.0000 mg | ORAL_CAPSULE | Freq: Once | ORAL | Status: AC
  Administered 2019-01-14: 21:00:00 25 mg via ORAL
  Filled 2019-01-14: qty 1

## 2019-01-14 MED ORDER — HYDROCODONE-ACETAMINOPHEN 5-325 MG PO TABS
1.0000 | ORAL_TABLET | Freq: Once | ORAL | Status: AC
  Administered 2019-01-14: 1 via ORAL
  Filled 2019-01-14: qty 1

## 2019-01-14 MED ORDER — GENERIC EXTERNAL MEDICATION
Status: DC
Start: ? — End: 2019-01-14

## 2019-01-14 MED ORDER — SODIUM CHLORIDE 0.9 % IV SOLN
10.00 | INTRAVENOUS | Status: DC
Start: ? — End: 2019-01-14

## 2019-01-14 MED ORDER — FUROSEMIDE 20 MG PO TABS
40.0000 mg | ORAL_TABLET | Freq: Once | ORAL | Status: AC
  Administered 2019-01-14: 40 mg via ORAL
  Filled 2019-01-14: qty 2

## 2019-01-14 MED ORDER — DIPHENHYDRAMINE HCL 25 MG PO CAPS
25.0000 mg | ORAL_CAPSULE | Freq: Once | ORAL | Status: AC
  Administered 2019-01-14: 19:00:00 25 mg via ORAL
  Filled 2019-01-14: qty 1

## 2019-01-14 NOTE — ED Triage Notes (Signed)
Pt presents with bilateral leg swelling, unsure when swelling worsened due to chronic swelling from CHF and CKD. Reports swelling increases during the day. States rash developed on RLE in the last few days. Pt appears short of breath, states its not worse than her normal.

## 2019-01-14 NOTE — ED Provider Notes (Signed)
[facility] EMERGENCY DEPARTMENT Provider Note   CSN: [id number] Arrival date & time: 01/14/19  1716    History   Chief Complaint Chief Complaint  Patient presents with  . Leg Swelling    HPI [name] is a 73 y.o. female.     Patient with history of CKD, CHF, stroke, COPD presents with worsening leg edema and rash.  Worsening over the past week.  Patient has had this before however this is worse.  Patient has been compliant with her medications and had Lasix increased fairly recently.  Patient has tried Benadryl for itching and its help with the legs.  No blood clot history.     Past Medical History:  Diagnosis Date  . Anxiety   . Arthritis    "all over" (07/29/2017)  . Bulging disc    "still have one in my neck" (07/29/2017)  . Carotid arterial disease (HCC)    a. s/p L intracranial ICA stenting ([place]).  . Cataract    "left eye; didn't take it out cause of macular degeneration" (07/29/2017)  . Cervical cancer (HCC)    hx  . Chronic kidney disease, stage III (moderate) ([place])    "only have 1 working kidney" (07/29/2017)  . COPD (chronic obstructive pulmonary disease) ([place])    a. quit smoking in 2007.  Marland Kitchen Coronary artery disease    a. 2007 s/p MI and LAD stenting;  10/2006 CABG x 4: LIMA->LAD, VG->OM->dLCX, VG->RPL;  c. 07/2013 MV: no ischemia.  . Depression   . Esophageal reflux   . Hepatitis 1968   "don't remember which one"  . History of blood transfusion 10/29/2006   "related to bypass OR"  . History of echocardiogram    a. 01/2012 Echo: EF 55%, mildly dil LA/RV, mild MR, Ao Sclerosis, mod TR.  Marland Kitchen Hypothyroid   . Insomnia   . Macular degeneration    "left eye" (07/29/2017)  . Mixed hyperlipidemia   . NSTEMI (non-ST elevated myocardial infarction) ([place]) 08/2006; 10/2006  . Osteoporosis   . Pneumonia    "several times before I quit smoking" (07/29/2017)  . Presence of permanent cardiac pacemaker   . PVT (paroxysmal ventricular  tachycardia) ([place])   . Restless leg syndrome   . Stroke [facility]) ?2008   denies residual on 07/29/2017  . Symptomatic bradycardia    a. s/p BSX [employer] dual chamber PPM Beckie Salts).  . Syncope and collapse   . Unspecified hearing loss     Patient Active Problem List   Diagnosis Date Noted  . Morbid obesity ([place]) 01/31/2018  . Syncope 07/28/2017  . Chronic kidney disease, stage III (moderate) ([place]) 07/28/2017  . COPD (chronic obstructive pulmonary disease) ([place]) 07/28/2017  . Pulmonary hypertension ([place]) 07/28/2017  . Chronic diastolic heart failure, NYHA class 1 ([place]) 07/28/2017  . Coronary artery disease 07/28/2017  . Mixed hyperlipidemia 07/28/2017  . Hypothyroid 07/28/2017  . History of PVT (paroxysmal ventricular tachycardia) ([place]) 07/28/2017  . Symptomatic bradycardia 07/28/2017  . Restless leg syndrome 07/28/2017  . Acute lower UTI 04/15/2017  . Syncope and collapse 04/15/2017  . CKD (chronic kidney disease) stage 3, GFR 30-59 ml/min (HCC) 04/15/2017  . CAD (coronary artery disease) S/P CABG 04/14/2017  . Sinus node dysfunction ([place]) 08/21/2013  . Atrial tachycardia ([place]) 08/21/2013  . Pacemaker -[employer] 08/21/2013  . Coronary atherosclerosis 11/21/2009  . GASTRIC ULCER 01/21/2009  . DIARRHEA, CHRONIC 12/31/2008  . IRON DEFICIENCY 11/28/2008  . LEG EDEMA 11/19/2008  . Chronic respiratory  failure with hypoxia ([place]) 11/19/2008  . Angina pectoris ([place]) 11/19/2008  . HEADACHE 08/19/2008  . MACULAR DEGENERATION 02/20/2008  . DISORDER, TOBACCO USE 02/09/2008  . CAROTID ARTERY DISEASE 02/13/2007  . TRANSIENT ISCHEMIC ATTACK 02/13/2007  . INSOMNIA 02/13/2007  . Restless legs syndrome (RLS) 01/27/2007  . OSTEOPENIA 01/13/2007  . ALKALINE PHOSPHATASE, ELEVATED 11/11/2006  . HEMORRHAGE, CONJUNCTIVAL 11/04/2006  . MALAISE AND FATIGUE 11/04/2006  . CERVICAL CANCER 10/11/2006  . Hyperlipidemia 10/11/2006  . Anxiety 10/11/2006  . Depression 10/11/2006  . Essential  hypertension 10/11/2006  . MYOCARDIAL INFARCTION, HX OF 10/11/2006  . GERD 10/11/2006  . OSTEOARTHRITIS 10/11/2006  . LOW BACK PAIN 10/11/2006    Past Surgical History:  Procedure Laterality Date  . ABDOMINAL HYSTERECTOMY  1970s   "partial"  . ANTERIOR CERVICAL DECOMP/DISCECTOMY FUSION  03/2001; 04/2005;?  date for 3rd OR   "used bone from my hips each time"  . BACK SURGERY    . CARDIAC CATHETERIZATION  2008  . CATARACT EXTRACTION W/ INTRAOCULAR LENS IMPLANT Right 05/2013  . CEREBRAL ANGIOGRAM Left 02/14/2007   w/ICA stent/notes 11/04/2010  . COLONOSCOPY    . CORONARY ANGIOPLASTY WITH STENT PLACEMENT  08/2006   "2 stents"  . CORONARY ARTERY BYPASS GRAFT  10/2006   "CABG X4"  . EYE SURGERY  05/2013   cataract removed  . INSERT / REPLACE / REMOVE PACEMAKER  03/2012   [place]; [employer]  . KNEE ARTHROSCOPY Bilateral X 7   "4 on my left; 3 on my right" (07/29/2017)  . LAPAROSCOPIC CHOLECYSTECTOMY    . TUBAL LIGATION       OB History   No obstetric history on file.      Home Medications    Prior to Admission medications   Medication Sig Start Date End Date Taking? Authorizing Provider  albuterol (PROVENTIL HFA;VENTOLIN HFA) 108 (90 Base) MCG/ACT inhaler Inhale 1-2 puffs into the lungs every 6 (six) hours as needed for wheezing or shortness of breath. 04/19/17   Rigoberto Noel, MD  aspirin 81 MG tablet Take 81 mg by mouth daily.    [provider]  atorvastatin (LIPITOR) 20 MG tablet Take 20 mg by mouth daily.    [provider]  clonazePAM Bobbye [place]) 0.5 MG tablet Take 1/2 tablet 45 minutes before bedtime 01/04/19   Cameron Sprang, MD  Ferrous Sulfate (IRON) 325 (65 Fe) MG TABS Take 1 tablet by mouth daily.    [provider]  folic acid (FOLVITE) 1 MG tablet Take 1 mg by mouth daily.    [provider]  furosemide (LASIX) 20 MG tablet Take 20 mg by mouth daily as needed (swelling).     [provider]  isosorbide  mononitrate (IMDUR) 30 MG 24 hr tablet Take 1 tablet (30 mg total) by mouth daily. 01/31/18   Jerline Pain, MD  levothyroxine (SYNTHROID, LEVOTHROID) 75 MCG tablet TAKE 1 TABLET EVERY DAY  (NEW  DOSE) 07/01/17   Susy Frizzle, MD  Melatonin 10 MG TABS Take 20 mg by mouth at bedtime.    [provider]  metoprolol tartrate (LOPRESSOR) 25 MG tablet Take 25 mg by mouth 2 (two) times daily.    [provider]  nitroGLYCERIN (NITROSTAT) 0.4 MG SL tablet Place 0.4 mg under the tongue every 5 (five) minutes as needed for chest pain.    [provider]  Oxcarbazepine (TRILEPTAL) 300 MG tablet Take 1 tablet twice daily 01/04/19   Cameron Sprang,  MD  pramipexole (MIRAPEX) 1 MG tablet Take 1 tablet every night 01/04/19   Cameron Sprang, MD  sertraline (ZOLOFT) 100 MG tablet Take 100 mg by mouth at bedtime.     [provider]  traZODone (DESYREL) 100 MG tablet Take 100 mg by mouth 2 (two) times daily.    [provider]  VITAMIN D PO Take by mouth.    [provider]    Family History Family History  Problem Relation Age of Onset  . Stomach cancer Mother   . Diabetes Mother   . Hyperlipidemia Mother   . Hypertension Father   . Lung cancer Father   . Alcohol abuse Father   . Early death Brother     Social History Social History   Tobacco Use  . Smoking status: Former Smoker    Packs/day: 0.75    Years: 40.00    Pack years: 30.00    Types: Cigarettes    Start date: 09/28/1965    Last attempt to quit: 05/27/2006    Years since quitting: 12.6  . Smokeless tobacco: Never Used  Substance Use Topics  . Alcohol use: Yes    Alcohol/week: 1.0 standard drinks    Types: 1 Glasses of wine per week  . Drug use: No     Allergies   Codeine and Iohexol   Review of Systems Review of Systems  Constitutional: Negative for chills and fever.  HENT: Negative for congestion.   Respiratory: Positive for shortness of breath.   Cardiovascular:  Positive for leg swelling. Negative for chest pain.  Gastrointestinal: Negative for abdominal pain and vomiting.  Musculoskeletal: Negative for back pain, neck pain and neck stiffness.  Skin: Positive for rash.  Neurological: Negative for light-headedness and headaches.     Physical Exam Updated Vital Signs BP (!) 151/68   Pulse 70   Temp 97.7 F (36.5 C) (Oral)   Resp (!) 25   SpO2 99%   Physical Exam Vitals signs and nursing note reviewed.  Constitutional:      Appearance: She is well-developed.  HENT:     Head: Normocephalic and atraumatic.  Eyes:     General:        Right eye: No discharge.        Left eye: No discharge.     Conjunctiva/sclera: Conjunctivae normal.  Neck:     Musculoskeletal: Normal range of motion and neck supple.     Trachea: No tracheal deviation.  Cardiovascular:     Rate and Rhythm: Normal rate and regular rhythm.  Pulmonary:     Effort: Pulmonary effort is normal.     Breath sounds: Normal breath sounds.  Abdominal:     General: There is no distension.     Palpations: Abdomen is soft.     Tenderness: There is no abdominal tenderness. There is no guarding.  Musculoskeletal:     Right lower leg: Edema present.     Left lower leg: Edema present.  Skin:    General: Skin is warm.     Findings: Rash present.     Comments: Patient has 3+ edema right lower extremity below the knee and 2+ on the left.  Patient has rash bilateral lower extremities worse on the right with mild warmth papular component.  No hives.  No crepitus.  Mild tender bilateral both lower extremities to palpation.  Neurological:     Mental Status: She is alert and oriented to person, place, and time.  ED Treatments / Results  Labs (all labs ordered are listed, but only abnormal results are displayed) Labs Reviewed  CBC WITH DIFFERENTIAL/PLATELET - Abnormal; Notable for the following components:      Result Value   Monocytes Absolute 1.2 (*)    All other components  within normal limits  BASIC METABOLIC PANEL - Abnormal; Notable for the following components:   Glucose, Bld 65 (*)    BUN 26 (*)    Creatinine, Ser 1.61 (*)    Calcium 8.8 (*)    GFR calc non Af Amer 31 (*)    GFR calc Af Amer 36 (*)    All other components within normal limits  BRAIN NATRIURETIC PEPTIDE    EKG None  Radiology Dg Chest 2 View  Result Date: 01/14/2019 CLINICAL DATA:  Acute shortness of breath. EXAM: CHEST - 2 VIEW COMPARISON:  07/28/2017 and prior radiograph FINDINGS: Cardiomegaly, CABG changes and LEFT-sided pacemaker again noted. There is no evidence of focal airspace disease, pulmonary edema, suspicious pulmonary nodule/mass, pleural effusion, or pneumothorax. No acute bony abnormalities are identified. LOWER cervical spine fusion hardware again noted. IMPRESSION: Cardiomegaly without evidence of acute cardiopulmonary disease. Electronically Signed   By: Margarette Canada M.D.   On: 01/14/2019 18:39    Procedures Procedures (including critical care time)  Medications Ordered in ED Medications  diphenhydrAMINE (BENADRYL) capsule 25 mg (has no administration in time range)  HYDROcodone-acetaminophen (NORCO/VICODIN) 5-325 MG per tablet 1 tablet (has no administration in time range)  furosemide (LASIX) tablet 40 mg (has no administration in time range)  diphenhydrAMINE (BENADRYL) capsule 25 mg (25 mg Oral Given 01/14/19 1911)     Initial Impression / Assessment and Plan / ED Course  I have reviewed the triage vital signs and the nursing notes.  Pertinent labs & imaging results that were available during my care of the patient were reviewed by me and considered in my medical decision making (see chart for details).       Patient presents with gradually worsening bilateral leg edema and weight gain over the past week.  Patient was seen and had extensive work-up at [facility] on Friday however she feels she is worsening.  Leg edema is worsening, pain because of it and  itching and rash.  Patient was started on antibiotic but unsure if for her leg or another reason.  Patient feels it may be related to her seizure medication however she is not sure and will follow up with primary doctor to wean off to see if it helps.  Patient has mild shortness of breath and has chronic swelling from CHF and CKD.  Reviewed medical records at [facility] kidney function 1.5 range, ultrasound bilateral lower extremities negative for blood clot, BNP and troponin within normal range.  Plan to repeat kidney function, chest x-ray with worsening symptoms and check electrolytes.  Patient is on Lasix.  Patient's blood work reviewed, kidney function similar to previous.  Chest x-ray reviewed no acute pulmonary edema.  Patient's vital signs mild elevated blood pressure for which patient has a history of.  Patient stable for close outpatient follow-up.  Pain meds given in the ER, half dose of Benadryl improved itching and repeat dose patient requesting.  Discussed importance of follow-up this week for repeat potassium and kidney function.  Final Clinical Impressions(s) / ED Diagnoses   Final diagnoses:  CKD (chronic kidney disease), symptom management only, unspecified stage  Bilateral leg edema    ED Discharge Orders    None  Elnora Morrison, MD 01/14/19 2031

## 2019-01-14 NOTE — Discharge Instructions (Addendum)
You can take an extra 20 mg of Lasix the next 3 days in the evening.  Please follow-up with your primary care doctor for repeat kidney function and potassium checked the end of this week.  Return for chest pain, shortness of breath or new concerns.

## 2019-01-16 ENCOUNTER — Telehealth: Payer: Self-pay | Admitting: Neurology

## 2019-01-16 ENCOUNTER — Telehealth: Payer: Self-pay | Admitting: *Deleted

## 2019-01-16 NOTE — Telephone Encounter (Signed)
Spoke with pt about changing her appointment to a virtual appt.      TELEPHONE CALL NOTE  This patient has been deemed a candidate for follow-up tele-health visit to limit community exposure during the Covid-19 pandemic. I spoke with the patient via phone to discuss instructions. This has been outlined on the patient's AVS (dotphrase: [email or web address]). The patient was advised to review the section on consent for treatment as well. The patient will receive a phone call 2-3 days prior to their E-Visit at which time consent will be verbally confirmed.   A Virtual Office Visit appointment type has been scheduled for 5/4 with Dr Barbara Cower, with "VIDEO" or "TELEPHONE" in the appointment notes - patient prefers VIDEO type.  I have either confirmed the patient is active in MyChart or offered to send sign-up link to phone/email via Mychart icon beside patient's photo. Pt has [place], RN 01/16/2019 4:31 PM  Verbal consent obtained but consent information also sent via [place].

## 2019-01-16 NOTE — Telephone Encounter (Signed)
Patient left message with answering service on 01/14/19 regarding her Seizure medication and a month ago she started the medication and noticed her legs, hands and feet swelling. She went to the ER regarding her neck swelling and feeling like she cannot breathe. Please Call. Thanks

## 2019-01-17 NOTE — Telephone Encounter (Signed)
Patient is calling in again about medication and pain in legs. Thanks!

## 2019-01-18 NOTE — Telephone Encounter (Signed)
Pt called pt stated she has been in the ER twice, she stated that she thinks that the increase in her oxcarbazepine called her to break out , have headache, blurry vision, she is itchy, her leg is swollen, she said that the medication is not working. Pt also is out of the Klonopin 0.5mg  she said that the klonopin had started helping her some, in last visit pt was to call back after being on medication a week from 01/04/2019 to see if dose should be increased? Should pt have an virtual visit with Dr. Delice Lesch when she has an opening?

## 2019-01-18 NOTE — Telephone Encounter (Signed)
That is a lot of issues and Dr. Delice Lesch is out of the office.  The one thing that I have a question about is why she is out of the klonopin.  Dr. Delice Lesch wrote for klonopin - 0.5 mg - 1/2 tablet at bedtime (so 15 tablets would last a month) and she wrote #45 with 3 refills.  She wrote the RX on 4/9.  Why is she out of the RX now?

## 2019-01-19 NOTE — Telephone Encounter (Signed)
Called pt this morning to ask about her Klonopin she stated that the drug store only gave her 12 pills part of the prescription and she did not cut them in half she took a whole pill instead of a half, pt informed to call them pharmacy and ask to get the rest of her prescription and to see if they can cut them for her or to get a pill cutter and ask them to show her how to use it so that she is taken her medication properly, pt verbalized understanding and stated that she would call the drug store. Pt asked about decreasing her oxcarbazepine, pt informed Dr Delice Lesch would be in the office Monday for me to ask about that medication change.

## 2019-01-22 NOTE — Telephone Encounter (Signed)
Pt called and informed Ok to reduce oxcarbazepine 300mg : Take 1/2 tablet twice a day. Pls let her know the leg swelling appears to be infection in her skin and this is why she was put on antibiotic in the ER. Usually the rash from medication is over the chest and trunk, not on the legs. Pt verbalized understanding

## 2019-01-22 NOTE — Telephone Encounter (Signed)
Agree with clonazepam instructions, 1/2 tab every night. Ok to reduce oxcarbazepine 300mg : Take 1/2 tablet twice a day. Pls let her know the leg swelling appears to be infection in her skin and this is why she was put on antibiotic in the ER. Usually the rash from medication is over the chest and trunk, not on the legs. Thanks!

## 2019-01-29 ENCOUNTER — Telehealth (INDEPENDENT_AMBULATORY_CARE_PROVIDER_SITE_OTHER): Payer: Medicare HMO | Admitting: Cardiology

## 2019-01-29 ENCOUNTER — Encounter: Payer: Self-pay | Admitting: Cardiology

## 2019-01-29 ENCOUNTER — Other Ambulatory Visit: Payer: Self-pay

## 2019-01-29 VITALS — BP 129/64 | HR 70 | Ht 62.0 in | Wt 226.0 lb

## 2019-01-29 DIAGNOSIS — R600 Localized edema: Secondary | ICD-10-CM | POA: Diagnosis not present

## 2019-01-29 DIAGNOSIS — I10 Essential (primary) hypertension: Secondary | ICD-10-CM

## 2019-01-29 DIAGNOSIS — I495 Sick sinus syndrome: Secondary | ICD-10-CM

## 2019-01-29 DIAGNOSIS — Z950 Presence of cardiac pacemaker: Secondary | ICD-10-CM

## 2019-01-29 DIAGNOSIS — I2510 Atherosclerotic heart disease of native coronary artery without angina pectoris: Secondary | ICD-10-CM | POA: Diagnosis not present

## 2019-01-29 DIAGNOSIS — Z951 Presence of aortocoronary bypass graft: Secondary | ICD-10-CM

## 2019-01-29 NOTE — Progress Notes (Signed)
Virtual Visit via Video Note   This visit type was conducted due to national recommendations for restrictions regarding the COVID-19 Pandemic (e.g. social distancing) in an effort to limit this patient's exposure and mitigate transmission in our community.  Due to her co-morbid illnesses, this patient is at least at moderate risk for complications without adequate follow up.  This format is felt to be most appropriate for this patient at this time.  All issues noted in this document were discussed and addressed.  A limited physical exam was performed with this format.  Please refer to the patient's chart for her consent to telehealth for [facility].   Date:  01/29/2019   ID:  [name], DOB Jan 10, 1946, MRN [id number]  Patient Location: Home Provider Location: Home  PCP:  Stacie Glaze, DO  Cardiologist:  Candee Furbish, MD  Electrophysiologist:  None   Evaluation Performed:  Follow-Up Visit  Chief Complaint: Lower extremity swelling  History of Present Illness:    [name] is a 73 y.o. female with remote CABG, pacemaker placement, morbid obesity with chronic lower extremity edema here for follow-up.  She states that she is feeling terrible, her legs are huge and broken out, scratches it all the time.  Has been taking Benadryl.  Has been to several emergency departments including [place], [place].  Frustrating for her.  Her Lasix has been increased to 40 mg twice a day.  She does state that she drinks a lot of fluid because of her kidneys.  She is tracking her GFR.  She was aware of it being 31 at last check.  Breaking out has moved to arm. Trying to work in with vascular.  Has been previously referred.  We will try again.  "Drinks a lot with the kidney". Lasix 40mg  PO BID.  Used to see kidney specialist, she states, but no longer.  2 Fridays ago [place]. Skin is tender. No fever Baseline cough.  Just finished antibiotics.  The patient does not have  symptoms concerning for COVID-19 infection (fever, chills, cough, or new shortness of breath).    Past Medical History:  Diagnosis Date  . Anxiety   . Arthritis    "all over" (07/29/2017)  . Bulging disc    "still have one in my neck" (07/29/2017)  . Carotid arterial disease (HCC)    a. s/p L intracranial ICA stenting ([place]).  . Cataract    "left eye; didn't take it out cause of macular degeneration" (07/29/2017)  . Cervical cancer (HCC)    hx  . Chronic kidney disease, stage III (moderate) ([place])    "only have 1 working kidney" (07/29/2017)  . COPD (chronic obstructive pulmonary disease) ([place])    a. quit smoking in 2007.  Marland Kitchen Coronary artery disease    a. 2007 s/p MI and LAD stenting;  10/2006 CABG x 4: LIMA->LAD, VG->OM->dLCX, VG->RPL;  c. 07/2013 MV: no ischemia.  . Depression   . Esophageal reflux   . Hepatitis 1968   "don't remember which one"  . History of blood transfusion 10/29/2006   "related to bypass OR"  . History of echocardiogram    a. 01/2012 Echo: EF 55%, mildly dil LA/RV, mild MR, Ao Sclerosis, mod TR.  Marland Kitchen Hypothyroid   . Insomnia   . Macular degeneration    "left eye" (07/29/2017)  . Mixed hyperlipidemia   . NSTEMI (non-ST elevated myocardial infarction) ([place]) 08/2006; 10/2006  . Osteoporosis   . Pneumonia    "several times before  I quit smoking" (07/29/2017)  . Presence of permanent cardiac pacemaker   . PVT (paroxysmal ventricular tachycardia) ([place])   . Restless leg syndrome   . Stroke [facility]) ?2008   denies residual on 07/29/2017  . Symptomatic bradycardia    a. s/p BSX Advantio dual chamber [facility]).  . Syncope and collapse   . Unspecified hearing loss    Past Surgical History:  Procedure Laterality Date  . ABDOMINAL HYSTERECTOMY  1970s   "partial"  . ANTERIOR CERVICAL DECOMP/DISCECTOMY FUSION  03/2001; 04/2005;?  date for 3rd OR   "used bone from my hips each time"  . BACK SURGERY    . CARDIAC CATHETERIZATION  2008  . CATARACT EXTRACTION W/  INTRAOCULAR LENS IMPLANT Right 05/2013  . CEREBRAL ANGIOGRAM Left 02/14/2007   w/ICA stent/notes 11/04/2010  . COLONOSCOPY    . CORONARY ANGIOPLASTY WITH STENT PLACEMENT  08/2006   "2 stents"  . CORONARY ARTERY BYPASS GRAFT  10/2006   "CABG X4"  . EYE SURGERY  05/2013   cataract removed  . INSERT / REPLACE / REMOVE PACEMAKER  03/2012   [place], [place]; [employer]  . KNEE ARTHROSCOPY Bilateral X 7   "4 on my left; 3 on my right" (07/29/2017)  . LAPAROSCOPIC CHOLECYSTECTOMY    . TUBAL LIGATION       Current Meds  Medication Sig  . albuterol (PROVENTIL HFA;VENTOLIN HFA) 108 (90 Base) MCG/ACT inhaler Inhale 1-2 puffs into the lungs every 6 (six) hours as needed for wheezing or shortness of breath.  Marland Kitchen aspirin 81 MG tablet Take 81 mg by mouth daily.  Marland Kitchen atorvastatin (LIPITOR) 20 MG tablet Take 20 mg by mouth daily.  . clonazePAM (KLONOPIN) 0.5 MG tablet Take 1/2 tablet 45 minutes before bedtime  . Ferrous Sulfate (IRON) 325 (65 Fe) MG TABS Take 1 tablet by mouth daily.  . folic acid (FOLVITE) 1 MG tablet Take 1 mg by mouth daily.  . furosemide (LASIX) 20 MG tablet Take 40 mg by mouth 2 (two) times a day.  . isosorbide mononitrate (IMDUR) 30 MG 24 hr tablet Take 1 tablet (30 mg total) by mouth daily.  Marland Kitchen levothyroxine (SYNTHROID, LEVOTHROID) 75 MCG tablet TAKE 1 TABLET EVERY DAY  (NEW  DOSE)  . Melatonin 10 MG TABS Take 30 mg by mouth at bedtime.   . metoprolol tartrate (LOPRESSOR) 25 MG tablet Take 25 mg by mouth 2 (two) times daily.  . nitroGLYCERIN (NITROSTAT) 0.4 MG SL tablet Place 0.4 mg under the tongue every 5 (five) minutes as needed for chest pain.  . Oxcarbazepine (TRILEPTAL) 300 MG tablet Take 1 tablet twice daily  . pramipexole (MIRAPEX) 1 MG tablet Take 1 tablet every night  . sertraline (ZOLOFT) 100 MG tablet Take 100 mg by mouth at bedtime.   . traZODone (DESYREL) 100 MG tablet Take 100 mg by mouth 2 (two) times daily.  Marland Kitchen VITAMIN D PO Take by mouth.     Allergies:    Codeine and Iohexol   Social History   Tobacco Use  . Smoking status: Former Smoker    Packs/day: 0.75    Years: 40.00    Pack years: 30.00    Types: Cigarettes    Start date: 09/28/1965    Last attempt to quit: 05/27/2006    Years since quitting: 12.6  . Smokeless tobacco: Never Used  Substance Use Topics  . Alcohol use: Yes    Alcohol/week: 1.0 standard drinks    Types: 1 Glasses of  wine per week  . Drug use: No     Family Hx: The patient's family history includes Alcohol abuse in her father; Diabetes in her mother; Early death in her brother; Hyperlipidemia in her mother; Hypertension in her father; Lung cancer in her father; Stomach cancer in her mother.  ROS:   Please see the history of present illness.    Denies chest pain, shortness of breath, bleeding All other systems reviewed and are negative.   Prior CV studies:   The following studies were reviewed today:  ECHO 07/28/17 - Left ventricle: The cavity size was normal. Systolic function was   normal. The estimated ejection fraction was in the range of 60%   to 65%. - Mitral valve: There was mild regurgitation. - Right ventricle: The cavity size was moderately dilated. Systolic   function was mildly reduced. - Right atrium: The atrium was severely dilated. - Tricuspid valve: There was severe regurgitation. - Pulmonary arteries: PA peak pressure: 41 mm Hg (S).  Labs/Other Tests and Data Reviewed:    EKG:  An ECG dated 07/31/2018 was personally reviewed today and demonstrated:  Atrial pacing otherwise unremarkable heart rate 71  Recent Labs: 01/14/2019: B Natriuretic Peptide 194.8; BUN 26; Creatinine, Ser 1.61; Hemoglobin 12.5; Platelets 157; Potassium 4.1; Sodium 139   Recent Lipid Panel Lab Results  Component Value Date/Time   CHOL 168 04/16/2017 04:09 AM   TRIG 98 04/16/2017 04:09 AM   HDL 61 04/16/2017 04:09 AM   CHOLHDL 2.8 04/16/2017 04:09 AM   LDLCALC 87 04/16/2017 04:09 AM    Wt Readings from Last  3 Encounters:  01/29/19 226 lb (102.5 kg)  01/04/19 230 lb (104.3 kg)  10/23/18 226 lb (102.5 kg)     Objective:    Vital Signs:  BP 129/64   Pulse 70   Ht 5\' 2"  (1.575 m)   Wt 226 lb (102.5 kg)   BMI 41.34 kg/m    VITAL SIGNS:  reviewed GEN:  no acute distress EYES:  sclerae anicteric, EOMI - Extraocular Movements Intact RESPIRATORY:  normal respiratory effort, symmetric expansion CARDIOVASCULAR:  Bilateral lower extremity edema noted with redness, thickening of ankles, mild excoriations SKIN:  As above erythema MUSCULOSKELETAL:  no obvious deformities. NEURO:  alert and oriented x 3, no obvious focal deficit PSYCH:  normal affect  ASSESSMENT & PLAN:     Lower extremity edema -Has been an ongoing issue for her.  Was last in the ER on 01/14/2019 for this.  Multiple hospital visits.  Creatinine has been in the 1.5 range.  Bilateral ultrasound was negative for DVT, BNP and troponin were normal.  Continuing Lasix 40 mg twice a day, in fact encouraged her to perhaps try an extra 40 mg in the morning.  Continue to watch her fluid intake, sounds like she has been drinking too much.  1.5 L at the max.  Last creatinine 1.6.  01/14/2019. Just finished antibiotic. - She states that vascular referral has been made but she is still waiting for an appointment response.  We will go ahead and place a referral from our side as well.  I told her that conservative measures, decreasing fluids, leg elevation compression stockings movement are needed.  She states that she cannot get her compression stockings on.  Coronary artery disease -Remote PCI and CABG.  Low-dose aspirin.  Plavix was stopped previously.  Previously complained of bruising with this medication.  Obesity, morbid -Continue to encourage weight loss  Essential hypertension Medications reviewed.  Under  reasonable control.   Sick sinus syndrome -Dr. Lovena Le.  Pacemaker placement.  Pacemaker placement-[place]  COVID-19 Education:  The signs and symptoms of COVID-19 were discussed with the patient and how to seek care for testing (follow up with PCP or arrange E-visit).  The importance of social distancing was discussed today.  Time:   Today, I have spent 17 minutes with the patient with telehealth technology discussing the above problems.     Medication Adjustments/Labs and Tests Ordered: Current medicines are reviewed at length with the patient today.  Concerns regarding medicines are outlined above.   Tests Ordered: Orders Placed This Encounter  Procedures  . Ambulatory referral to Vascular Surgery    Medication Changes: No orders of the defined types were placed in this encounter.   Disposition:  Follow up 6 months with Cecille Rubin, 12 with me  Signed, Candee Furbish, MD  01/29/2019 9:52 AM    [place]

## 2019-01-29 NOTE — Patient Instructions (Signed)
Medication Instructions:  The current medical regimen is effective;  continue present plan and medications.  If you need a refill on your cardiac medications before your next appointment, please call your pharmacy.   You have been referred for further evaluation of lower leg swelling.  You will be contacted to schedule to be seen.  Follow-Up: . Follow up in 6 months with Truitt Merle, NP and 1 year with Dr. Marlou Porch.  You will receive a letter in the mail 2 months before you are due.  Please call us when you receive this letter to schedule your follow up appointment.  Thank you for choosing [facility]!!

## 2019-02-05 ENCOUNTER — Telehealth: Payer: Self-pay

## 2019-02-05 ENCOUNTER — Other Ambulatory Visit: Payer: Self-pay

## 2019-02-05 ENCOUNTER — Telehealth: Payer: Self-pay | Admitting: *Deleted

## 2019-02-05 ENCOUNTER — Ambulatory Visit (INDEPENDENT_AMBULATORY_CARE_PROVIDER_SITE_OTHER): Payer: Medicare HMO | Admitting: *Deleted

## 2019-02-05 DIAGNOSIS — Z8673 Personal history of transient ischemic attack (TIA), and cerebral infarction without residual deficits: Secondary | ICD-10-CM

## 2019-02-05 DIAGNOSIS — I495 Sick sinus syndrome: Secondary | ICD-10-CM | POA: Diagnosis not present

## 2019-02-05 NOTE — Telephone Encounter (Signed)
Left message for patient to remind of missed remote transmission.  

## 2019-02-05 NOTE — Telephone Encounter (Signed)
New Message    Pt is returning call and says she sent her transmission and will re send it    Please call

## 2019-02-05 NOTE — Telephone Encounter (Signed)
Pt transmission received 02/05/2019

## 2019-02-06 LAB — CUP PACEART REMOTE DEVICE CHECK
Battery Remaining Longevity: 36 mo
Battery Remaining Percentage: 54 %
Brady Statistic RA Percent Paced: 98 %
Brady Statistic RV Percent Paced: 14 %
Date Time Interrogation Session: 20200511151400
Implantable Lead Implant Date: 20130611
Implantable Lead Implant Date: 20130611
Implantable Lead Location: 753859
Implantable Lead Location: 753860
Implantable Lead Model: 4469
Implantable Lead Model: 5076
Implantable Lead Serial Number: 566580
Implantable Pulse Generator Implant Date: 20130611
Lead Channel Impedance Value: 432 Ohm
Lead Channel Impedance Value: 638 Ohm
Lead Channel Pacing Threshold Amplitude: 0.5 V
Lead Channel Pacing Threshold Pulse Width: 0.4 ms
Lead Channel Setting Pacing Amplitude: 2 V
Lead Channel Setting Pacing Amplitude: 2.4 V
Lead Channel Setting Pacing Pulse Width: 0.4 ms
Lead Channel Setting Sensing Sensitivity: 2.5 mV
Pulse Gen Serial Number: 131914

## 2019-02-14 NOTE — Progress Notes (Signed)
Remote pacemaker transmission.   

## 2019-02-20 ENCOUNTER — Other Ambulatory Visit: Payer: Self-pay

## 2019-02-20 ENCOUNTER — Ambulatory Visit: Payer: Medicare HMO | Admitting: Neurology

## 2019-02-20 ENCOUNTER — Telehealth: Payer: Self-pay | Admitting: Neurology

## 2019-02-20 MED ORDER — CLONAZEPAM 0.5 MG PO TABS: 0.5000 mg | ORAL_TABLET | Freq: Every day | ORAL | 3 refills | Status: DC

## 2019-02-20 NOTE — Telephone Encounter (Signed)
Pt called informed that our office does not prescribe alprazolam, but Dr Delice Lesch would increase her Clonazepam from 1/2 tab to a whole tab every night. Pt verbalized understanding, no questions, script cent to [place]

## 2019-02-20 NOTE — Telephone Encounter (Signed)
RLS medication is not working. Patient wants the Alprezaline (not sure if I spelt that right) medication. She said that her legs are having spastic fits. Her muscles tighten up for 5 hours. She is not sleeping. Please call her back at  [phone]. Thanks!

## 2019-02-20 NOTE — Telephone Encounter (Signed)
Pls let her know that our office does not prescribe alprazolam. I had prescribed clonazepam 0.5mg  1/2 tablet every night, she can increase the dose to 1 tablet every night. Pls send in Rx, thanks

## 2019-02-20 NOTE — Addendum Note (Signed)
Addended by: Cameron Sprang on: 02/20/2019 10:15 AM   Modules accepted: Orders

## 2019-03-27 ENCOUNTER — Other Ambulatory Visit: Payer: Self-pay | Admitting: Cardiology

## 2019-04-25 ENCOUNTER — Encounter (HOSPITAL_COMMUNITY): Payer: Medicare HMO

## 2019-04-25 ENCOUNTER — Encounter: Payer: Medicare HMO | Admitting: Vascular Surgery

## 2019-05-02 ENCOUNTER — Encounter (HOSPITAL_COMMUNITY): Payer: Medicare HMO

## 2019-05-02 ENCOUNTER — Encounter: Payer: Medicare HMO | Admitting: Vascular Surgery

## 2019-05-07 ENCOUNTER — Ambulatory Visit (INDEPENDENT_AMBULATORY_CARE_PROVIDER_SITE_OTHER): Payer: Medicare HMO | Admitting: *Deleted

## 2019-05-07 DIAGNOSIS — I4729 Other ventricular tachycardia: Secondary | ICD-10-CM

## 2019-05-07 DIAGNOSIS — I472 Ventricular tachycardia: Secondary | ICD-10-CM

## 2019-05-07 DIAGNOSIS — I495 Sick sinus syndrome: Secondary | ICD-10-CM

## 2019-05-07 LAB — CUP PACEART REMOTE DEVICE CHECK
Battery Remaining Longevity: 30 mo
Battery Remaining Percentage: 45 %
Brady Statistic RA Percent Paced: 98 %
Brady Statistic RV Percent Paced: 20 %
Date Time Interrogation Session: 20200810065100
Implantable Lead Implant Date: 20130611
Implantable Lead Implant Date: 20130611
Implantable Lead Location: 753859
Implantable Lead Location: 753860
Implantable Lead Model: 4469
Implantable Lead Model: 5076
Implantable Lead Serial Number: 566580
Implantable Pulse Generator Implant Date: 20130611
Lead Channel Impedance Value: 397 Ohm
Lead Channel Impedance Value: 594 Ohm
Lead Channel Pacing Threshold Amplitude: 0.5 V
Lead Channel Pacing Threshold Pulse Width: 0.4 ms
Lead Channel Setting Pacing Amplitude: 2 V
Lead Channel Setting Pacing Amplitude: 2.4 V
Lead Channel Setting Pacing Pulse Width: 0.4 ms
Lead Channel Setting Sensing Sensitivity: 2.5 mV
Pulse Gen Serial Number: 131914

## 2019-05-16 NOTE — Progress Notes (Signed)
Remote pacemaker transmission.   

## 2019-05-31 ENCOUNTER — Encounter (HOSPITAL_COMMUNITY): Payer: Medicare HMO

## 2019-05-31 ENCOUNTER — Encounter: Payer: Medicare HMO | Admitting: Vascular Surgery

## 2019-07-18 ENCOUNTER — Telehealth: Payer: Self-pay | Admitting: Neurology

## 2019-07-18 MED ORDER — CLONAZEPAM 0.5 MG PO TABS: 0.5000 mg | ORAL_TABLET | Freq: Every day | ORAL | 3 refills | Status: DC

## 2019-07-18 NOTE — Telephone Encounter (Signed)
Refills sent to [facility] 

## 2019-07-18 NOTE — Telephone Encounter (Signed)
Caller left msg with after hours about a refill and that she is almost out. [facility] mail pharm. Clonazepam 0.5mg 

## 2019-07-18 NOTE — Telephone Encounter (Signed)
Route to pool

## 2019-07-30 ENCOUNTER — Ambulatory Visit: Payer: Medicare HMO | Admitting: Cardiology

## 2019-08-06 ENCOUNTER — Ambulatory Visit (INDEPENDENT_AMBULATORY_CARE_PROVIDER_SITE_OTHER): Payer: Medicare HMO | Admitting: *Deleted

## 2019-08-06 DIAGNOSIS — R55 Syncope and collapse: Secondary | ICD-10-CM | POA: Diagnosis not present

## 2019-08-06 DIAGNOSIS — I495 Sick sinus syndrome: Secondary | ICD-10-CM

## 2019-08-07 LAB — CUP PACEART REMOTE DEVICE CHECK
Battery Remaining Longevity: 24 mo
Battery Remaining Percentage: 43 %
Brady Statistic RA Percent Paced: 98 %
Brady Statistic RV Percent Paced: 19 %
Date Time Interrogation Session: 20201109075100
Implantable Lead Implant Date: 20130611
Implantable Lead Implant Date: 20130611
Implantable Lead Location: 753859
Implantable Lead Location: 753860
Implantable Lead Model: 4469
Implantable Lead Model: 5076
Implantable Lead Serial Number: 566580
Implantable Pulse Generator Implant Date: 20130611
Lead Channel Impedance Value: 403 Ohm
Lead Channel Impedance Value: 602 Ohm
Lead Channel Pacing Threshold Amplitude: 0.5 V
Lead Channel Pacing Threshold Pulse Width: 0.4 ms
Lead Channel Setting Pacing Amplitude: 2 V
Lead Channel Setting Pacing Amplitude: 2.4 V
Lead Channel Setting Pacing Pulse Width: 0.4 ms
Lead Channel Setting Sensing Sensitivity: 2.5 mV
Pulse Gen Serial Number: 131914

## 2019-08-20 ENCOUNTER — Other Ambulatory Visit: Payer: Self-pay | Admitting: Neurology

## 2019-08-20 ENCOUNTER — Other Ambulatory Visit: Payer: Self-pay

## 2019-08-20 ENCOUNTER — Telehealth: Payer: Self-pay | Admitting: Neurology

## 2019-08-20 MED ORDER — CLONAZEPAM 1 MG PO TABS: 1.0000 mg | ORAL_TABLET | Freq: Every day | ORAL | 3 refills | Status: DC

## 2019-08-20 NOTE — Telephone Encounter (Signed)
Patient switched Drug stores and needs a RX sent to the [facility] for the Clonazepam  She has one pill left  Last seen 01-04-19

## 2019-08-20 NOTE — Telephone Encounter (Signed)
Pt does not want to send refills to [facility] anymore. States that they are expensive and if there is a problem with one medication that they will hold other meds and not mail them out.  New prescription for Clonazepam 1mg  tabs. #90 with 3 refills send to [facility]. Pt states that they will deliver. Rx printed. Will have Dr. Delice Lesch sign.

## 2019-08-30 ENCOUNTER — Other Ambulatory Visit: Payer: Self-pay | Admitting: Neurology

## 2019-08-30 NOTE — Telephone Encounter (Signed)
Patient is changing her pharm from [facility] to [facility] because they deliver. Refill on the Oxcarbazepine 300 mg to the [facility] please. The pharm instructed her to call us because they don't have a prescription. Thanks!

## 2019-08-31 MED ORDER — OXCARBAZEPINE 300 MG PO TABS: ORAL_TABLET | ORAL | 3 refills | Status: DC

## 2019-08-31 NOTE — Telephone Encounter (Signed)
Requested Prescriptions   Pending Prescriptions Disp Refills  . Oxcarbazepine (TRILEPTAL) 300 MG tablet 180 tablet 3    Sig: Take 1 tablet twice daily   Rx last filled:01/04/19 #180 3 refills  Pt last seen:01/04/19  Follow up appt scheduled:none

## 2019-09-03 NOTE — Progress Notes (Signed)
Remote pacemaker transmission.   

## 2019-10-01 ENCOUNTER — Ambulatory Visit: Payer: Medicare HMO | Admitting: Cardiology

## 2019-11-01 ENCOUNTER — Encounter: Payer: Self-pay | Admitting: Cardiology

## 2019-11-01 ENCOUNTER — Other Ambulatory Visit: Payer: Self-pay

## 2019-11-01 ENCOUNTER — Ambulatory Visit: Payer: Medicare HMO | Admitting: Cardiology

## 2019-11-01 VITALS — BP 120/70 | HR 70 | Ht 62.0 in | Wt 221.0 lb

## 2019-11-01 DIAGNOSIS — I10 Essential (primary) hypertension: Secondary | ICD-10-CM

## 2019-11-01 DIAGNOSIS — I209 Angina pectoris, unspecified: Secondary | ICD-10-CM

## 2019-11-01 DIAGNOSIS — Z951 Presence of aortocoronary bypass graft: Secondary | ICD-10-CM | POA: Diagnosis not present

## 2019-11-01 MED ORDER — ISOSORBIDE MONONITRATE ER 60 MG PO TB24: 60.0000 mg | ORAL_TABLET | Freq: Every day | ORAL | 3 refills | Status: DC

## 2019-11-01 MED ORDER — ATORVASTATIN CALCIUM 40 MG PO TABS: 40.0000 mg | ORAL_TABLET | Freq: Every day | ORAL | 3 refills | Status: DC

## 2019-11-01 NOTE — Patient Instructions (Signed)
Medication Instructions:  Please increase your Atorvastatin to 40 mg a day. Increase Isosorbide 60 mg a day. Continue all other medications as listed.  *If you need a refill on your cardiac medications before your next appointment, please call your pharmacy*  Follow-Up: At [facility] you and your health needs are our priority.  As part of our continuing mission to provide you with exceptional heart care, we have created designated Provider Care Teams.  These Care Teams include your primary Cardiologist (physician) and Advanced Practice Providers (APPs -  Physician Assistants and Nurse Practitioners) who all work together to provide you with the care you need, when you need it.  Your next appointment:   6 month(s)  The format for your next appointment:   In Person  Provider:   Truitt Merle, NP and Dr Marlou Porch in 1 year.  Thank you for choosing [facility]!!

## 2019-11-01 NOTE — Progress Notes (Signed)
Cardiology Office Note:    Date:  11/01/2019   ID:  [name], DOB [birth year], MRN [id number]  PCP:  Stacie Glaze, DO  Cardiologist: Dr. Bronson Ing (previously)  Candee Furbish, MD    Referring MD: Stacie Glaze, DO     History of Present Illness:    [name] is a 74 y.o. female here for the follow-up of coronary atherosclerosis.   Moved to the [place] in [place].   She saw Dr. Bronson Ing last on 08/23/16 (but drive was too far at this point) as well as Dr. Lovena Le in 09/10/16. She would like to consolidate her care here in [place].  Has history of CAD/CABG, HTN, HL, COPD, CVA, and pacemaker.  NUC 08/10/2013 - low risk, no ischemia  Rare NTG use. Balance issues, cane/ walker.   Left kidney small, non functioning well.   Overall she is doing quite well. She has lost some weight. She is using walker. She does get short of breath with activity. Deconditioning. Continue to promote movement.  01/31/2018-overall doing fairly well.  Has been complaining of excessive fatigue, leg swelling, diarrhea, back pain, passing out, bruising. Does have some chest pain, like a big bubble in epigastric, sternal like going to explode, out of breath real easy she states. More pressure than pain. Started a few weeks ago. Not when sitting. Only when walking or doing something. If bend over has to straighten up to catch breath. Did not feel like this before CABG. Has COPD and URI. Started after that. Legs are awful. Cramps in feet and hands (fingers contort). Feet swell at at end of day. One kidney does not work well. Stomach is horrible, IBS, no control. Happens in store. House bound.   11/01/2019-here for follow-up angina coronary disease.  Please see below for details.  Past Medical History:  Diagnosis Date  . Anxiety   . Arthritis    "all over" (07/29/2017)  . Bulging disc    "still have one in my neck" (07/29/2017)  . Carotid arterial disease (HCC)    a. s/p L  intracranial ICA stenting (Deveshwar).  . Cataract    "left eye; didn't take it out cause of macular degeneration" (07/29/2017)  . Cervical cancer (HCC)    hx  . Chronic kidney disease, stage III (moderate)    "only have 1 working kidney" (07/29/2017)  . COPD (chronic obstructive pulmonary disease) ([place])    a. quit smoking in 2007.  Marland Kitchen Coronary artery disease    a. 2007 s/p MI and LAD stenting;  10/2006 CABG x 4: LIMA->LAD, VG->OM->dLCX, VG->RPL;  c. 07/2013 MV: no ischemia.  . Depression   . Esophageal reflux   . Hepatitis 1968   "don't remember which one"  . History of blood transfusion 10/29/2006   "related to bypass OR"  . History of echocardiogram    a. 01/2012 Echo: EF 55%, mildly dil LA/RV, mild MR, Ao Sclerosis, mod TR.  Marland Kitchen Hypothyroid   . Insomnia   . Macular degeneration    "left eye" (07/29/2017)  . Mixed hyperlipidemia   . NSTEMI (non-ST elevated myocardial infarction) ([place]) 08/2006; 10/2006  . Osteoporosis   . Pneumonia    "several times before I quit smoking" (07/29/2017)  . Presence of permanent cardiac pacemaker   . PVT (paroxysmal ventricular tachycardia) ([place])   . Restless leg syndrome   . Stroke [facility]) ?2008   denies residual on 07/29/2017  . Symptomatic bradycardia    a. s/p BSX Advantio dual  chamber PPM Beckie Salts).  . Syncope and collapse   . Unspecified hearing loss     Past Surgical History:  Procedure Laterality Date  . ABDOMINAL HYSTERECTOMY  1970s   "partial"  . ANTERIOR CERVICAL DECOMP/DISCECTOMY FUSION  03/2001; 04/2005;?  date for 3rd OR   "used bone from my hips each time"  . BACK SURGERY    . CARDIAC CATHETERIZATION  2008  . CATARACT EXTRACTION W/ INTRAOCULAR LENS IMPLANT Right 05/2013  . CEREBRAL ANGIOGRAM Left 02/14/2007   w/ICA stent/notes 11/04/2010  . COLONOSCOPY    . CORONARY ANGIOPLASTY WITH STENT PLACEMENT  08/2006   "2 stents"  . CORONARY ARTERY BYPASS GRAFT  10/2006   "CABG X4"  . EYE SURGERY  05/2013   cataract removed  . INSERT /  REPLACE / REMOVE PACEMAKER  03/2012   [place], [place]; [employer]  . KNEE ARTHROSCOPY Bilateral X 7   "4 on my left; 3 on my right" (07/29/2017)  . LAPAROSCOPIC CHOLECYSTECTOMY    . TUBAL LIGATION      Current Medications: Current Meds  Medication Sig  . albuterol (PROVENTIL HFA;VENTOLIN HFA) 108 (90 Base) MCG/ACT inhaler Inhale 1-2 puffs into the lungs every 6 (six) hours as needed for wheezing or shortness of breath.  Marland Kitchen aspirin 81 MG tablet Take 81 mg by mouth daily.  . clonazePAM (KLONOPIN) 1 MG tablet Take 1 tablet (1 mg total) by mouth at bedtime. Take 1 tablet 30 minutes before bedtime  . Ferrous Sulfate (IRON) 325 (65 Fe) MG TABS Take 1 tablet by mouth daily.  . folic acid (FOLVITE) 1 MG tablet Take 1 mg by mouth daily.  . furosemide (LASIX) 20 MG tablet Take 20 mg by mouth 2 (two) times a day.   . levothyroxine (SYNTHROID, LEVOTHROID) 75 MCG tablet TAKE 1 TABLET EVERY DAY  (NEW  DOSE)  . Melatonin 10 MG TABS Take 30 mg by mouth at bedtime.   . metoprolol tartrate (LOPRESSOR) 25 MG tablet Take 25 mg by mouth daily.   . nitroGLYCERIN (NITROSTAT) 0.4 MG SL tablet Place 0.4 mg under the tongue every 5 (five) minutes as needed for chest pain.  . Oxcarbazepine (TRILEPTAL) 300 MG tablet Take 1 tablet twice daily  . pramipexole (MIRAPEX) 1 MG tablet Take 1 tablet every night  . sertraline (ZOLOFT) 100 MG tablet Take 100 mg by mouth at bedtime.   . traZODone (DESYREL) 100 MG tablet Take 100 mg by mouth 2 (two) times daily.  Marland Kitchen VITAMIN D PO Take by mouth.  . [DISCONTINUED] atorvastatin (LIPITOR) 20 MG tablet Take 20 mg by mouth daily.  . [DISCONTINUED] isosorbide mononitrate (IMDUR) 30 MG 24 hr tablet TAKE ONE TABLET BY MOUTH DAILY     Allergies:   Codeine and Iohexol   Social History   Socioeconomic History  . Marital status: Widowed    Spouse name: Not on file  . Number of children: 2  . Years of education: Not on file  . Highest education level: Some college, no degree   Occupational History  . 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 Smoking status: Former Smoker    Packs/day: 0.75    Years: 40.00    Pack years: 30.00    Types: Cigarettes    Start date: 09/28/1965    Quit date: 05/27/2006    Years since quitting: 13.4  . Smokeless tobacco: Never Used  Substance and Sexual Activity  . Alcohol use:  Yes    Alcohol/week: 1.0 standard drinks    Types: 1 Glasses of wine per week  . Drug use: No  . Sexual activity: Not Currently  Other Topics Concern  . 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 Difficulty of Paying Living Expenses: Not on file  Food Insecurity:   . Worried About Charity fundraiser in the Last Year: Not on file  . Ran Out of Food in the Last Year: Not on file  Transportation Needs:   . Lack of Transportation (Medical): Not on file  . Lack of Transportation (Non-Medical): Not on file  Physical Activity:   . Days of Exercise per Week: Not on file  . Minutes of Exercise per Session: Not on file  Stress:   . Feeling of Stress : Not on file  Social Connections:   . Frequency of Communication with Friends and Family: Not on file  . Frequency of Social Gatherings with Friends and Family: Not on file  . Attends Religious Services: Not on file  . Active Member of Clubs or Organizations: Not on file  . Attends Archivist Meetings: Not on file  . Marital Status: Not on file     Family History: The patient's family history includes Alcohol abuse in her father; Diabetes in her mother; Early death in her brother; Hyperlipidemia in her mother; Hypertension in her father; Lung cancer in her father; Stomach cancer in her mother. ROS:   Please see the history of  present illness.     All other systems reviewed and are negative.  EKGs/Labs/Other Studies Reviewed:    The following studies were reviewed today: Prior office notes, lab work, EKG  EKG: 11/01/2019-sinus rhythm 70 incomplete right bundle branch block NSR 79. Normal. Personally viewed.   Recent Labs: 01/14/2019: B Natriuretic Peptide 194.8; BUN 26; Creatinine, Ser 1.61; Hemoglobin 12.5; Platelets 157; Potassium 4.1; Sodium 139   Recent Lipid Panel    Component Value Date/Time   CHOL 168 04/16/2017 0409   TRIG 98 04/16/2017 0409   HDL 61 04/16/2017 0409   CHOLHDL 2.8 04/16/2017 0409   VLDL 20 04/16/2017 0409   LDLCALC 87 04/16/2017 0409    Physical Exam:    VS:  BP 120/70   Pulse 70   Ht 5\' 2"  (1.575 m)   Wt 221 lb (100.2 kg)   SpO2 98%   BMI 40.42 kg/m     Wt Readings from Last 3 Encounters:  11/01/19 221 lb (100.2 kg)  01/29/19 226 lb (102.5 kg)  01/04/19 230 lb (104.3 kg)    GEN: Well nourished, well developed, in no acute distress  HEENT: normal  Neck: no JVD, carotid bruits, or masses Cardiac: CABG, RRR; no murmurs, rubs, or gallops,no edema  Respiratory:  clear to auscultation bilaterally, normal work of breathing GI: soft, nontender, nondistended, + BS MS: no deformity or atrophy  Skin: warm and dry, no rash Neuro:  Alert and Oriented x 3, Strength and sensation are intact Psych: euthymic mood, full affect    ASSESSMENT:    1. Essential hypertension   2. Hx of CABG   3. Morbid obesity ([place])   4. Angina pectoris ([place])  PLAN:    In order of problems listed above:  CAD/CABG/history of MI/angina  - Imdur to 30 mg once a day. Shower or making bed hard to breathe and chest discomfort.  NTG 2 has helped. Inhailer, NTG 15 min. Better. No HA  - Will increased IMDUR to 60 QD - Continue with aggressive secondary prevention.  Her bubblelike sensation in her stomach, pressure could be GI related or related to her IBS as well.  If symptoms worsen or become more  worrisome, one could consider cardiac catheterization however we need to weigh the risks and benefits given her creatinine of 1.5.  Continue to encourage exercise.  - ASA, Imdur increased to 60, metop, statin increased    Angina  -As above  [place] HTN  - controlled  - meds reviewed, in fact usually she has low normal blood pressure since her bypass.  Overall she has been doing well.  No further dizzy episodes.  HL  - Atorvastatin increased to 40mg .  LDL 87 and 2018.  Repeat lipid panel in 6 months if not already done by PCP  History of CVA  - ASA,  Stable  Pacer  - Dr. Lovena Le  - Stable.  [employer] dual-chamber.  Last visit reviewed, working normally.  No high risk episodes.  She had a previous episode where she woke up on the bathroom floor at night bowel and bladder incontinence, bleeding.  Everything stable.  LE edema  - chronic, this morning appears normal.  - lasix 40 and 20 alternating.   - Quite mild currently. She states that by the end of the day it is troublesome to get shoes on.  No changes  Morbid obesity  - BMI greater than 35 with 2 comorbidities. Continue to work on weight. She states that most of her adult life she was 130 pounds. After her first heart attack she continued to gain weight.  Challenging for her to exercise because of arthritis.  Continue to encourage exercise.  DNR  -Has this order in place.  Multitude of medical issues, restless leg, IBS, COPD, CAD, morbid obesity, chronic fatigue.  Medication Adjustments/Labs and Tests Ordered: Current medicines are reviewed at length with the patient today.  Concerns regarding medicines are outlined above. Labs and tests ordered and medication changes are outlined in the patient instructions below:  Patient Instructions  Medication Instructions:  Please increase your Atorvastatin to 40 mg a day. Increase Isosorbide 60 mg a day. Continue all other medications as listed.  *If you need a refill on  your cardiac medications before your next appointment, please call your pharmacy*  Follow-Up: At [facility], you and your health needs are our priority.  As part of our continuing mission to provide you with exceptional heart care, we have created designated Provider Care Teams.  These Care Teams include your primary Cardiologist (physician) and Advanced Practice Providers (APPs -  Physician Assistants and Nurse Practitioners) who all work together to provide you with the care you need, when you need it.  Your next appointment:   6 month(s)  The format for your next appointment:   In Person  Provider:   Truitt Merle, NP and Dr Marlou Porch in 1 year.  Thank you for choosing [facility]!!        Signed, Candee Furbish, MD  11/01/2019 5:05 PM    [place]

## 2019-11-05 ENCOUNTER — Ambulatory Visit (INDEPENDENT_AMBULATORY_CARE_PROVIDER_SITE_OTHER): Payer: Medicare HMO | Admitting: *Deleted

## 2019-11-05 DIAGNOSIS — R55 Syncope and collapse: Secondary | ICD-10-CM | POA: Diagnosis not present

## 2019-11-05 LAB — CUP PACEART REMOTE DEVICE CHECK
Battery Remaining Longevity: 24 mo
Battery Remaining Percentage: 36 %
Brady Statistic RA Percent Paced: 97 %
Brady Statistic RV Percent Paced: 17 %
Date Time Interrogation Session: 20210208025100
Implantable Lead Implant Date: 20130611
Implantable Lead Implant Date: 20130611
Implantable Lead Location: 753859
Implantable Lead Location: 753860
Implantable Lead Model: 4469
Implantable Lead Model: 5076
Implantable Lead Serial Number: 566580
Implantable Pulse Generator Implant Date: 20130611
Lead Channel Impedance Value: 397 Ohm
Lead Channel Impedance Value: 561 Ohm
Lead Channel Pacing Threshold Amplitude: 0.5 V
Lead Channel Pacing Threshold Pulse Width: 0.4 ms
Lead Channel Setting Pacing Amplitude: 2 V
Lead Channel Setting Pacing Amplitude: 2.4 V
Lead Channel Setting Pacing Pulse Width: 0.4 ms
Lead Channel Setting Sensing Sensitivity: 2.5 mV
Pulse Gen Serial Number: 131914

## 2019-11-06 NOTE — Progress Notes (Signed)
PPM Remote  

## 2019-12-04 ENCOUNTER — Telehealth: Payer: Self-pay | Admitting: Neurology

## 2019-12-04 ENCOUNTER — Other Ambulatory Visit: Payer: Self-pay

## 2019-12-04 MED ORDER — PRAMIPEXOLE DIHYDROCHLORIDE 1 MG PO TABS: ORAL_TABLET | ORAL | 3 refills | Status: DC

## 2019-12-04 NOTE — Telephone Encounter (Signed)
Patient called in regarding her being out of her medication Pramipexole. She does not need wont [employer] to fill it instead to have [facility] fill it. Thank you

## 2019-12-11 ENCOUNTER — Encounter: Payer: Self-pay | Admitting: Internal Medicine

## 2019-12-11 ENCOUNTER — Ambulatory Visit: Payer: Medicare Other | Admitting: Internal Medicine

## 2019-12-11 ENCOUNTER — Other Ambulatory Visit: Payer: Self-pay

## 2019-12-11 VITALS — BP 106/60 | HR 70 | Ht 62.0 in | Wt 220.0 lb

## 2019-12-11 DIAGNOSIS — I495 Sick sinus syndrome: Secondary | ICD-10-CM

## 2019-12-11 DIAGNOSIS — Z45018 Encounter for adjustment and management of other part of cardiac pacemaker: Secondary | ICD-10-CM | POA: Diagnosis not present

## 2019-12-11 DIAGNOSIS — I10 Essential (primary) hypertension: Secondary | ICD-10-CM

## 2019-12-11 DIAGNOSIS — I2510 Atherosclerotic heart disease of native coronary artery without angina pectoris: Secondary | ICD-10-CM

## 2019-12-11 NOTE — Progress Notes (Signed)
HPI [name] returns today for ongoing evauation of her [facility] PPM, and a h/o CAD, obesity, and HTN. She has multiple aches especially in her legs.  She has muscluloskeletal pain and stopped her statin. No sob and no chest pain. She denies chest pain, sob, or syncope. She would like to get back into the pool.  Allergies  Allergen Reactions  . Codeine     swelling  . Iohexol      Desc: PT HAD CONTRAST REACTION YRS AGO NEEDS 13 HR PREP FOR ALL CONTRAST STUDIES      Current Outpatient Medications  Medication Sig Dispense Refill  . albuterol (PROVENTIL HFA;VENTOLIN HFA) 108 (90 Base) MCG/ACT inhaler Inhale 1-2 puffs into the lungs every 6 (six) hours as needed for wheezing or shortness of breath. 1 Inhaler 5  . aspirin 81 MG tablet Take 81 mg by mouth daily.    Marland Kitchen atorvastatin (LIPITOR) 40 MG tablet Take 1 tablet (40 mg total) by mouth daily. 90 tablet 3  . clonazePAM (KLONOPIN) 1 MG tablet Take 1 tablet (1 mg total) by mouth at bedtime. Take 1 tablet 30 minutes before bedtime 90 tablet 3  . Ferrous Sulfate (IRON) 325 (65 Fe) MG TABS Take 1 tablet by mouth daily.    . folic acid (FOLVITE) 1 MG tablet Take 1 mg by mouth daily.    . furosemide (LASIX) 20 MG tablet Take 20 mg by mouth 2 (two) times a day.     . isosorbide mononitrate (IMDUR) 60 MG 24 hr tablet Take 1 tablet (60 mg total) by mouth daily. 90 tablet 3  . levothyroxine (SYNTHROID, LEVOTHROID) 75 MCG tablet TAKE 1 TABLET EVERY DAY  (NEW  DOSE) 90 tablet 2  . Melatonin 10 MG TABS Take 30 mg by mouth at bedtime.     . metoprolol tartrate (LOPRESSOR) 25 MG tablet Take 25 mg by mouth daily.     . nitroGLYCERIN (NITROSTAT) 0.4 MG SL tablet Place 0.4 mg under the tongue every 5 (five) minutes as needed for chest pain.    . Oxcarbazepine (TRILEPTAL) 300 MG tablet Take 1 tablet twice daily 180 tablet 3  . pramipexole (MIRAPEX) 1 MG tablet Take 1 tablet every night 90 tablet 3  . sertraline (ZOLOFT) 100 MG tablet Take 100 mg  by mouth at bedtime.     . traZODone (DESYREL) 100 MG tablet Take 100 mg by mouth 2 (two) times daily.    Marland Kitchen VITAMIN D PO Take by mouth.     No current facility-administered medications for this visit.     Past Medical History:  Diagnosis Date  . Anxiety   . Arthritis    "all over" (07/29/2017)  . Bulging disc    "still have one in my neck" (07/29/2017)  . Carotid arterial disease (HCC)    a. s/p L intracranial ICA stenting (Deveshwar).  . Cataract    "left eye; didn't take it out cause of macular degeneration" (07/29/2017)  . Cervical cancer (HCC)    hx  . Chronic kidney disease, stage III (moderate)    "only have 1 working kidney" (07/29/2017)  . COPD (chronic obstructive pulmonary disease) ([place])    a. quit smoking in 2007.  Marland Kitchen Coronary artery disease    a. 2007 s/p MI and LAD stenting;  10/2006 CABG x 4: LIMA->LAD, VG->OM->dLCX, VG->RPL;  c. 07/2013 MV: no ischemia.  . Depression   . Esophageal reflux   . Hepatitis 1968   "don't  remember which one"  . History of blood transfusion 10/29/2006   "related to bypass OR"  . History of echocardiogram    a. 01/2012 Echo: EF 55%, mildly dil LA/RV, mild MR, Ao Sclerosis, mod TR.  Marland Kitchen Hypothyroid   . Insomnia   . Macular degeneration    "left eye" (07/29/2017)  . Mixed hyperlipidemia   . NSTEMI (non-ST elevated myocardial infarction) ([place]) 08/2006; 10/2006  . Osteoporosis   . Pneumonia    "several times before I quit smoking" (07/29/2017)  . Presence of permanent cardiac pacemaker   . PVT (paroxysmal ventricular tachycardia) ([place])   . Restless leg syndrome   . Stroke [facility]) ?2008   denies residual on 07/29/2017  . Symptomatic bradycardia    a. s/p BSX Advantio dual chamber PPM Beckie Salts).  . Syncope and collapse   . Unspecified hearing loss     ROS:   All systems reviewed and negative except as noted in the HPI.   Past Surgical History:  Procedure Laterality Date  . ABDOMINAL HYSTERECTOMY  1970s   "partial"  . ANTERIOR  CERVICAL DECOMP/DISCECTOMY FUSION  03/2001; 04/2005;?  date for 3rd OR   "used bone from my hips each time"  . BACK SURGERY    . CARDIAC CATHETERIZATION  2008  . CATARACT EXTRACTION W/ INTRAOCULAR LENS IMPLANT Right 05/2013  . CEREBRAL ANGIOGRAM Left 02/14/2007   w/ICA stent/notes 11/04/2010  . COLONOSCOPY    . CORONARY ANGIOPLASTY WITH STENT PLACEMENT  08/2006   "2 stents"  . CORONARY ARTERY BYPASS GRAFT  10/2006   "CABG X4"  . EYE SURGERY  05/2013   cataract removed  . INSERT / REPLACE / REMOVE PACEMAKER  03/2012   [place], [place]; [employer]  . KNEE ARTHROSCOPY Bilateral X 7   "4 on my left; 3 on my right" (07/29/2017)  . LAPAROSCOPIC CHOLECYSTECTOMY    . TUBAL LIGATION       Family History  Problem Relation Age of Onset  . Stomach cancer Mother   . Diabetes Mother   . Hyperlipidemia Mother   . Hypertension Father   . Lung cancer Father   . Alcohol abuse Father   . Early death Brother      Social History   Socioeconomic History  . Marital status: Widowed    Spouse name: Not on file  . Number of children: 2  . Years of education: Not on file  . Highest education level: Some college, no degree  Occupational History  . 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 Smoking status: Former Smoker    Packs/day: 0.75    Years: 40.00    Pack years: 30.00    Types: Cigarettes    Start date: 09/28/1965    Quit date: 05/27/2006    Years since quitting: 13.5  . Smokeless tobacco: Never Used  Substance and Sexual Activity  . Alcohol use: Yes    Alcohol/week: 1.0 standard drinks    Types: 1 Glasses of wine per week  . Drug use: No  . Sexual activity: Not Currently  Other Topics Concern  . 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  Difficulty of Paying Living Expenses:   Food Insecurity:   . Worried About Charity fundraiser in the Last Year:   . Arboriculturist in the Last Year:   Transportation Needs:   . Film/video editor (Medical):   Marland Kitchen Lack of Transportation (Non-Medical):   Physical Activity:   . Days of Exercise per Week:   . Minutes of Exercise per Session:   Stress:   . Feeling of Stress :   Social Connections:   . Frequency of Communication with Friends and Family:   . Frequency of Social Gatherings with Friends and Family:   . Attends Religious Services:   . Active Member of Clubs or Organizations:   . Attends Archivist Meetings:   Marland Kitchen Marital Status:   Intimate Partner Violence:   . Fear of Current or Ex-Partner:   . Emotionally Abused:   Marland Kitchen Physically Abused:   . Sexually Abused:      BP 106/60   Pulse 70   Ht 5\' 2"  (1.575 m)   Wt 220 lb (99.8 kg)   SpO2 94%   BMI 40.24 kg/m   Physical Exam:  obese appearing woman, NAD HEENT: Unremarkable Neck:  6 cm JVD, no thyromegally Lymphatics:  No adenopathy Back:  No CVA tenderness Lungs:  Clear with no wheezes HEART:  Regular rate rhythm, no murmurs, no rubs, no clicks Abd:  soft, positive bowel sounds, no organomegally, no rebound, no guarding Ext:  2 plus pulses, no edema, no cyanosis, no clubbing Skin:  No rashes no nodules Neuro:  CN II through XII intact, motor grossly intact  DEVICE  Normal device function.  See PaceArt for details.   Assess/Plan: 1. CAD - she denies anginal symptoms, s/p PCI/CABG 2. Obesity - I strongly encouraged her to try and get down below 200 lbs. She used to be in the 130 range. 3. Sinus node dysfunction - she is asymptomatic, s/p PPM insertion. Her [employer] DDD PM is working normally.  Mikle Bosworth.D

## 2019-12-11 NOTE — Patient Instructions (Signed)

## 2019-12-13 ENCOUNTER — Telehealth: Payer: Self-pay

## 2019-12-13 ENCOUNTER — Telehealth: Payer: Self-pay | Admitting: Neurology

## 2019-12-13 DIAGNOSIS — G2581 Restless legs syndrome: Secondary | ICD-10-CM

## 2019-12-13 NOTE — Telephone Encounter (Signed)
Pt called and informed that Mirapex at high dose, we had discussed before how high doses can actually worsen restless leg. Recommend she split the dose, take 1/2 tab at 4pm and 1/2 tab prior to bedtime. Also, will need to check her iron levels, if low, this can also worsen symptoms

## 2019-12-13 NOTE — Telephone Encounter (Signed)
Patient called in regarding her Restless Legs. She said that she cannot lay down or sit she has to be walking. She said that she is very tired. She said the medication was helping some and now it's not helping. "She said she is going out of her mind". She is needing help with it. Please Call. Thank you

## 2019-12-13 NOTE — Telephone Encounter (Signed)
Mirapex at high dose, we had discussed before how high doses can actually worsen restless leg. Recommend she split the dose, take 1/2 tab at 4pm and 1/2 tab prior to bedtime. Also, will need to check her iron levels, if low, this can also worsen symptoms. Pls order Fe+TIBC+Fer. Thanks

## 2019-12-17 LAB — CUP PACEART INCLINIC DEVICE CHECK
Date Time Interrogation Session: 20210316000000
Implantable Lead Implant Date: 20130611
Implantable Lead Implant Date: 20130611
Implantable Lead Location: 753859
Implantable Lead Location: 753860
Implantable Lead Model: 4469
Implantable Lead Model: 5076
Implantable Lead Serial Number: 566580
Implantable Pulse Generator Implant Date: 20130611
Lead Channel Impedance Value: 445 Ohm
Lead Channel Impedance Value: 606 Ohm
Lead Channel Pacing Threshold Amplitude: 0.4 V
Lead Channel Pacing Threshold Amplitude: 1.5 V
Lead Channel Pacing Threshold Pulse Width: 0.4 ms
Lead Channel Pacing Threshold Pulse Width: 0.4 ms
Lead Channel Sensing Intrinsic Amplitude: 2.4 mV
Lead Channel Sensing Intrinsic Amplitude: 5.2 mV
Lead Channel Setting Pacing Amplitude: 2 V
Lead Channel Setting Pacing Amplitude: 2.4 V
Lead Channel Setting Pacing Pulse Width: 0.4 ms
Lead Channel Setting Sensing Sensitivity: 2.5 mV
Pulse Gen Serial Number: 131914

## 2019-12-18 ENCOUNTER — Telehealth: Payer: Self-pay | Admitting: Neurology

## 2019-12-18 ENCOUNTER — Other Ambulatory Visit (INDEPENDENT_AMBULATORY_CARE_PROVIDER_SITE_OTHER): Payer: Medicare Other

## 2019-12-18 ENCOUNTER — Other Ambulatory Visit: Payer: Self-pay

## 2019-12-18 DIAGNOSIS — G2581 Restless legs syndrome: Secondary | ICD-10-CM | POA: Diagnosis not present

## 2019-12-18 MED ORDER — SERTRALINE HCL 100 MG PO TABS: 100.0000 mg | ORAL_TABLET | Freq: Every day | ORAL | 3 refills | Status: DC

## 2019-12-18 NOTE — Telephone Encounter (Signed)
Patient came by the office to get some lab work.   She requested a call back from a nurse regarding her restless leg problem getting worse. She said the problem with her legs is happening at night and throughout the day.

## 2019-12-18 NOTE — Telephone Encounter (Signed)
Thanks for clarifying instructions with her. Refills for zoloft sent, thanks

## 2019-12-18 NOTE — Telephone Encounter (Signed)
Pt came today to get her Iron level done, she miss understood Mirapex   medication change take 1/2 tab at 4pm and 1/2 tab prior to bedtime. Asking if she can get zoloft refilled

## 2019-12-18 NOTE — Telephone Encounter (Signed)
Patient returned call from nurse

## 2019-12-18 NOTE — Telephone Encounter (Signed)
Patient called to check on the status of the inquiry.

## 2019-12-18 NOTE — Telephone Encounter (Signed)
Pt called no answer voice mail left for pt to call back 

## 2019-12-19 LAB — IRON,TIBC AND FERRITIN PANEL
%SAT: 9 % (calc) — ABNORMAL LOW (ref 16–45)
Ferritin: 18 ng/mL (ref 16–288)
Iron: 42 ug/dL — ABNORMAL LOW (ref 45–160)
TIBC: 453 mcg/dL (calc) — ABNORMAL HIGH (ref 250–450)

## 2020-01-08 ENCOUNTER — Telehealth: Payer: Self-pay | Admitting: Neurology

## 2020-01-08 ENCOUNTER — Telehealth: Payer: Self-pay

## 2020-01-08 NOTE — Telephone Encounter (Signed)
The following message was left with [employer] on 01/07/20 at 5:45 PM.  Caller says that she is returning a call about her lab results.

## 2020-01-08 NOTE — Telephone Encounter (Signed)
-----   Message from Cameron Sprang, MD sent at 01/07/2020 12:56 PM EDT ----- Pls fax iron studies to her PCP and let patient know her iron level is on the lower side, this can worsen restless legs. Pls have her f/u with PCP for low iron levels. Thanks

## 2020-01-08 NOTE — Telephone Encounter (Signed)
Spoke with pt informed her that iron level is on the lower side, this can worsen restless legs. Pls have her f/u with PCP for low iron levels. Pt stated she would call her PCP. Labs faxed to PCP office

## 2020-02-04 ENCOUNTER — Ambulatory Visit (INDEPENDENT_AMBULATORY_CARE_PROVIDER_SITE_OTHER): Payer: Medicare Other | Admitting: *Deleted

## 2020-02-04 DIAGNOSIS — I495 Sick sinus syndrome: Secondary | ICD-10-CM

## 2020-02-04 LAB — CUP PACEART REMOTE DEVICE CHECK
Battery Remaining Longevity: 18 mo
Battery Remaining Percentage: 28 %
Brady Statistic RA Percent Paced: 94 %
Brady Statistic RV Percent Paced: 10 %
Date Time Interrogation Session: 20210510025200
Implantable Lead Implant Date: 20130611
Implantable Lead Implant Date: 20130611
Implantable Lead Location: 753859
Implantable Lead Location: 753860
Implantable Lead Model: 4469
Implantable Lead Model: 5076
Implantable Lead Serial Number: 566580
Implantable Pulse Generator Implant Date: 20130611
Lead Channel Impedance Value: 387 Ohm
Lead Channel Impedance Value: 570 Ohm
Lead Channel Pacing Threshold Amplitude: 0.4 V
Lead Channel Pacing Threshold Pulse Width: 0.4 ms
Lead Channel Setting Pacing Amplitude: 2 V
Lead Channel Setting Pacing Amplitude: 2.4 V
Lead Channel Setting Pacing Pulse Width: 0.4 ms
Lead Channel Setting Sensing Sensitivity: 2.5 mV
Pulse Gen Serial Number: 131914

## 2020-02-05 NOTE — Progress Notes (Signed)
Remote pacemaker transmission.   

## 2020-02-14 ENCOUNTER — Other Ambulatory Visit: Payer: Self-pay

## 2020-02-14 ENCOUNTER — Telehealth (INDEPENDENT_AMBULATORY_CARE_PROVIDER_SITE_OTHER): Payer: Medicare Other | Admitting: Neurology

## 2020-02-14 ENCOUNTER — Encounter: Payer: Self-pay | Admitting: *Deleted

## 2020-02-14 ENCOUNTER — Encounter: Payer: Self-pay | Admitting: Neurology

## 2020-02-14 VITALS — Ht 61.0 in | Wt 218.0 lb

## 2020-02-14 DIAGNOSIS — R55 Syncope and collapse: Secondary | ICD-10-CM

## 2020-02-14 DIAGNOSIS — R404 Transient alteration of awareness: Secondary | ICD-10-CM | POA: Diagnosis not present

## 2020-02-14 DIAGNOSIS — G2581 Restless legs syndrome: Secondary | ICD-10-CM | POA: Diagnosis not present

## 2020-02-14 MED ORDER — OXCARBAZEPINE 300 MG PO TABS: ORAL_TABLET | ORAL | 3 refills | Status: AC

## 2020-02-14 MED ORDER — CLONAZEPAM 1 MG PO TABS: ORAL_TABLET | ORAL | 3 refills | Status: DC

## 2020-02-14 MED ORDER — SERTRALINE HCL 100 MG PO TABS: 100.0000 mg | ORAL_TABLET | Freq: Every day | ORAL | 3 refills | Status: DC

## 2020-02-14 MED ORDER — PRAMIPEXOLE DIHYDROCHLORIDE 1 MG PO TABS: ORAL_TABLET | ORAL | 3 refills | Status: DC

## 2020-02-14 MED ORDER — PREGABALIN 50 MG PO CAPS: ORAL_CAPSULE | ORAL | 5 refills | Status: DC
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
## 2020-02-14 NOTE — Progress Notes (Signed)
At[facility] Dozal Key: [id number] - PA Case [id number] - Rx #[id number] Need help? Call us at [phone] Outcome Additional Information Required This medication or product is on your plan's list of covered drugs. Prior authorization is not required at this time. If your pharmacy has questions regarding the processing of your prescription, please have them call the [facility] pharmacy help desk at [phone]. **Please note: Formulary lowering, tiering exception, cost reduction and/or pre-benefit determination review (including prospective Medicare hospice reviews) requests cannot be requested using this method of submission. Please contact us at [phone] instead. Drug clonazePAM 1MG  tablets Form [facility] Medicare Part D Electronic Prior Authorization Form (2017 NCPDP) Original Claim Info [id number] 01Provide Exception Process Printed Notice 02Maximum of 5 Refills Exceeded

## 2020-04-14 ENCOUNTER — Telehealth: Payer: Self-pay | Admitting: Emergency Medicine
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
## 2020-04-14 NOTE — Telephone Encounter (Signed)
[name] is calling back stating she was not at the orthopedic Doctor at the time this occurred like she originally thought. Please advise.

## 2020-04-14 NOTE — Telephone Encounter (Addendum)
Clarified with patient that she hawas having intermittant pain and no cardiac symptoms at time of tachy even on 04/11/20.

## 2020-05-05 ENCOUNTER — Ambulatory Visit (INDEPENDENT_AMBULATORY_CARE_PROVIDER_SITE_OTHER): Payer: Medicare Other | Admitting: *Deleted

## 2020-05-05 DIAGNOSIS — I5032 Chronic diastolic (congestive) heart failure: Secondary | ICD-10-CM | POA: Diagnosis not present

## 2020-05-05 LAB — CUP PACEART REMOTE DEVICE CHECK
Battery Remaining Longevity: 12 mo
Battery Remaining Percentage: 22 %
Brady Statistic RA Percent Paced: 95 %
Brady Statistic RV Percent Paced: 23 %
Date Time Interrogation Session: 20210809025000
Implantable Lead Implant Date: 20130611
Implantable Lead Implant Date: 20130611
Implantable Lead Location: 753859
Implantable Lead Location: 753860
Implantable Lead Model: 4469
Implantable Lead Model: 5076
Implantable Lead Serial Number: 566580
Implantable Pulse Generator Implant Date: 20130611
Lead Channel Impedance Value: 389 Ohm
Lead Channel Impedance Value: 539 Ohm
Lead Channel Pacing Threshold Amplitude: 0.4 V
Lead Channel Pacing Threshold Pulse Width: 0.4 ms
Lead Channel Setting Pacing Amplitude: 2 V
Lead Channel Setting Pacing Amplitude: 2.4 V
Lead Channel Setting Pacing Pulse Width: 0.4 ms
Lead Channel Setting Sensing Sensitivity: 2.5 mV
Pulse Gen Serial Number: 131914

## 2020-05-06 NOTE — Progress Notes (Signed)
Remote pacemaker transmission.   

## 2020-05-15 ENCOUNTER — Ambulatory Visit (INDEPENDENT_AMBULATORY_CARE_PROVIDER_SITE_OTHER): Payer: Medicare Other | Admitting: Neurology

## 2020-05-15 ENCOUNTER — Encounter: Payer: Self-pay | Admitting: Neurology

## 2020-05-15 ENCOUNTER — Other Ambulatory Visit: Payer: Self-pay

## 2020-05-15 VITALS — BP 104/64 | HR 57 | Ht 61.0 in | Wt 217.0 lb

## 2020-05-15 DIAGNOSIS — R55 Syncope and collapse: Secondary | ICD-10-CM

## 2020-05-15 DIAGNOSIS — R404 Transient alteration of awareness: Secondary | ICD-10-CM | POA: Diagnosis not present

## 2020-05-15 DIAGNOSIS — G2581 Restless legs syndrome: Secondary | ICD-10-CM

## 2020-05-15 NOTE — Patient Instructions (Signed)
1. Bloodwork for CBC, CMP, ammonia level  2. Recommend going to ER for worsening symptoms  3. Continue all your medications for now
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
## 2020-05-16 NOTE — Addendum Note (Signed)
Addended by: Armen Pickup A on: 05/16/2020 02:17 PM   Modules accepted: Orders

## 2020-08-04 ENCOUNTER — Ambulatory Visit (INDEPENDENT_AMBULATORY_CARE_PROVIDER_SITE_OTHER): Payer: Medicare Other

## 2020-08-04 DIAGNOSIS — I495 Sick sinus syndrome: Secondary | ICD-10-CM

## 2020-08-05 ENCOUNTER — Telehealth: Payer: Self-pay | Admitting: Student

## 2020-08-05 LAB — CUP PACEART REMOTE DEVICE CHECK
Battery Remaining Longevity: 9 mo
Battery Remaining Percentage: 13 %
Brady Statistic RA Percent Paced: 94 %
Brady Statistic RV Percent Paced: 28 %
Date Time Interrogation Session: 20211108025400
Implantable Lead Implant Date: 20130611
Implantable Lead Implant Date: 20130611
Implantable Lead Location: 753859
Implantable Lead Location: 753860
Implantable Lead Model: 4469
Implantable Lead Model: 5076
Implantable Lead Serial Number: 566580
Implantable Pulse Generator Implant Date: 20130611
Lead Channel Impedance Value: 402 Ohm
Lead Channel Impedance Value: 590 Ohm
Lead Channel Pacing Threshold Amplitude: 0.4 V
Lead Channel Pacing Threshold Pulse Width: 0.4 ms
Lead Channel Setting Pacing Amplitude: 2 V
Lead Channel Setting Pacing Amplitude: 2.4 V
Lead Channel Setting Pacing Pulse Width: 0.4 ms
Lead Channel Setting Sensing Sensitivity: 2.5 mV
Pulse Gen Serial Number: 131914

## 2020-08-05 NOTE — Telephone Encounter (Signed)
Scheduled remote with 9 mo until ERI.   Pt also with occasional HVR (atrially driven) She complains of intermittent, short palpitations that are not limiting or restrictive.   Looking back at reports, pt estimate 2 year battery life earlier this year, but 1.5 years prior to that. Suspect visual glitch related to battery estimate. Will review with [employer].   Will update remotes to monthly for battery checks.   Legrand Como [address]" [place], PA-C  08/05/2020 8:53 AM

## 2020-08-05 NOTE — Progress Notes (Signed)
Remote pacemaker transmission.   

## 2020-08-11 ENCOUNTER — Other Ambulatory Visit: Payer: Self-pay

## 2020-08-11 MED ORDER — PREGABALIN 50 MG PO CAPS: ORAL_CAPSULE | ORAL | 5 refills | Status: DC

## 2020-08-25 ENCOUNTER — Telehealth: Payer: Self-pay

## 2020-08-25 ENCOUNTER — Telehealth: Payer: Self-pay | Admitting: Internal Medicine

## 2020-08-25 NOTE — Telephone Encounter (Signed)
Encounter not needed

## 2020-08-25 NOTE — Telephone Encounter (Signed)
Left detailed message for Pt.  Advised she is not due for a gen change yet.  She has about 9 months left until ERI.  Advised we would monitor her device monthly and let her know when it is time to change out her device.  Advised she could call back to the device clinic if she had any further questions.

## 2020-08-25 NOTE — Telephone Encounter (Signed)
Patient called and states that she has changed her mind and wants to do a gen change. Patient would like a call back to discuss

## 2020-08-25 NOTE — Telephone Encounter (Signed)
Patient called back with questions about wanting to get her device changed out I explained to her the msg below and patient understands.

## 2020-09-08 ENCOUNTER — Ambulatory Visit (INDEPENDENT_AMBULATORY_CARE_PROVIDER_SITE_OTHER): Payer: Medicare Other

## 2020-09-08 DIAGNOSIS — I495 Sick sinus syndrome: Secondary | ICD-10-CM

## 2020-09-08 LAB — CUP PACEART REMOTE DEVICE CHECK
Battery Remaining Longevity: 7 mo
Battery Remaining Percentage: 11 %
Brady Statistic RA Percent Paced: 94 %
Brady Statistic RV Percent Paced: 27 %
Date Time Interrogation Session: 20211213025100
Implantable Lead Implant Date: 20130611
Implantable Lead Implant Date: 20130611
Implantable Lead Location: 753859
Implantable Lead Location: 753860
Implantable Lead Model: 4469
Implantable Lead Model: 5076
Implantable Lead Serial Number: 566580
Implantable Pulse Generator Implant Date: 20130611
Lead Channel Impedance Value: 420 Ohm
Lead Channel Impedance Value: 590 Ohm
Lead Channel Pacing Threshold Amplitude: 0.4 V
Lead Channel Pacing Threshold Pulse Width: 0.4 ms
Lead Channel Setting Pacing Amplitude: 2 V
Lead Channel Setting Pacing Amplitude: 2.4 V
Lead Channel Setting Pacing Pulse Width: 0.4 ms
Lead Channel Setting Sensing Sensitivity: 2.5 mV
Pulse Gen Serial Number: 131914

## 2020-09-23 NOTE — Progress Notes (Signed)
Remote pacemaker transmission.   

## 2020-10-13 ENCOUNTER — Ambulatory Visit (INDEPENDENT_AMBULATORY_CARE_PROVIDER_SITE_OTHER): Payer: Medicare Other

## 2020-10-13 DIAGNOSIS — I495 Sick sinus syndrome: Secondary | ICD-10-CM

## 2020-10-16 LAB — CUP PACEART REMOTE DEVICE CHECK
Battery Remaining Longevity: 4 mo
Battery Remaining Percentage: 6 %
Brady Statistic RA Percent Paced: 94 %
Brady Statistic RV Percent Paced: 28 %
Date Time Interrogation Session: 20220117025100
Implantable Lead Implant Date: 20130611
Implantable Lead Implant Date: 20130611
Implantable Lead Location: 753859
Implantable Lead Location: 753860
Implantable Lead Model: 4469
Implantable Lead Model: 5076
Implantable Lead Serial Number: 566580
Implantable Pulse Generator Implant Date: 20130611
Lead Channel Impedance Value: 385 Ohm
Lead Channel Impedance Value: 576 Ohm
Lead Channel Pacing Threshold Amplitude: 0.4 V
Lead Channel Pacing Threshold Pulse Width: 0.4 ms
Lead Channel Setting Pacing Amplitude: 2 V
Lead Channel Setting Pacing Amplitude: 2.4 V
Lead Channel Setting Pacing Pulse Width: 0.4 ms
Lead Channel Setting Sensing Sensitivity: 2.5 mV
Pulse Gen Serial Number: 131914

## 2020-10-27 NOTE — Addendum Note (Signed)
Addended by: Cheri Kearns A on: 10/27/2020 02:20 PM   Modules accepted: Level of Service

## 2020-10-27 NOTE — Progress Notes (Signed)
Remote pacemaker transmission.   

## 2020-11-03 ENCOUNTER — Other Ambulatory Visit: Payer: Self-pay | Admitting: Cardiology

## 2020-11-10 ENCOUNTER — Telehealth: Payer: Self-pay | Admitting: *Deleted

## 2020-11-10 NOTE — Telephone Encounter (Signed)
  Patient Consent for Virtual Visit         [name] has provided verbal consent on 11/10/2020 for a virtual visit (video or telephone).   CONSENT FOR VIRTUAL VISIT FOR:  [name]  By participating in this virtual visit I agree to the following:  I hereby voluntarily request, consent and authorize [place] and its employed or contracted physicians, physician assistants, nurse practitioners or other licensed health care professionals (the Practitioner), to provide me with telemedicine health care services (the "Services") as deemed necessary by the treating Practitioner. I acknowledge and consent to receive the Services by the Practitioner via telemedicine. I understand that the telemedicine visit will involve communicating with the Practitioner through live audiovisual communication technology and the disclosure of certain medical information by electronic transmission. I acknowledge that I have been given the opportunity to request an in-person assessment or other available alternative prior to the telemedicine visit and am voluntarily participating in the telemedicine visit.  I understand that I have the right to withhold or withdraw my consent to the use of telemedicine in the course of my care at any time, without affecting my right to future care or treatment, and that the Practitioner or I may terminate the telemedicine visit at any time. I understand that I have the right to inspect all information obtained and/or recorded in the course of the telemedicine visit and may receive copies of available information for a reasonable fee.  I understand that some of the potential risks of receiving the Services via telemedicine include:  Marland Kitchen Delay or interruption in medical evaluation due to technological equipment failure or disruption; . Information transmitted may not be sufficient (e.g. poor resolution of images) to allow for appropriate medical decision making by the  Practitioner; and/or  . In rare instances, security protocols could fail, causing a breach of personal health information.  Furthermore, I acknowledge that it is my responsibility to provide information about my medical history, conditions and care that is complete and accurate to the best of my ability. I acknowledge that Practitioner's advice, recommendations, and/or decision may be based on factors not within their control, such as incomplete or inaccurate data provided by me or distortions of diagnostic images or specimens that may result from electronic transmissions. I understand that the practice of medicine is not an exact science and that Practitioner makes no warranties or guarantees regarding treatment outcomes. I acknowledge that a copy of this consent can be made available to me via my patient portal ([place]), or I can request a printed copy by calling the office of [place].    I understand that my insurance will be billed for this visit.   I have read or had this consent read to me. . I understand the contents of this consent, which adequately explains the benefits and risks of the Services being provided via telemedicine.  . I have been provided ample opportunity to ask questions regarding this consent and the Services and have had my questions answered to my satisfaction. . I give my informed consent for the services to be provided through the use of telemedicine in my medical care

## 2020-11-11 ENCOUNTER — Telehealth (INDEPENDENT_AMBULATORY_CARE_PROVIDER_SITE_OTHER): Payer: Medicare Other | Admitting: Physician Assistant

## 2020-11-11 ENCOUNTER — Other Ambulatory Visit: Payer: Self-pay

## 2020-11-11 ENCOUNTER — Encounter: Payer: Self-pay | Admitting: Physician Assistant

## 2020-11-11 VITALS — Ht 61.0 in | Wt 217.0 lb

## 2020-11-11 DIAGNOSIS — I5032 Chronic diastolic (congestive) heart failure: Secondary | ICD-10-CM

## 2020-11-11 DIAGNOSIS — I495 Sick sinus syndrome: Secondary | ICD-10-CM

## 2020-11-11 DIAGNOSIS — I10 Essential (primary) hypertension: Secondary | ICD-10-CM

## 2020-11-11 DIAGNOSIS — I2510 Atherosclerotic heart disease of native coronary artery without angina pectoris: Secondary | ICD-10-CM | POA: Diagnosis not present

## 2020-11-11 DIAGNOSIS — E782 Mixed hyperlipidemia: Secondary | ICD-10-CM

## 2020-11-11 NOTE — Patient Instructions (Signed)
Medication Instructions:  Your physician recommends that you continue on your current medications as directed. Please refer to the Current Medication list given to you today.  *If you need a refill on your cardiac medications before your next appointment, please call your pharmacy*   Lab Work: None ordered  If you have labs (blood work) drawn today and your tests are completely normal, you will receive your results only by: . MyChart Message (if you have MyChart) OR . A paper copy in the mail If you have any lab test that is abnormal or we need to change your treatment, we will call you to review the results.   Testing/Procedures: None ordered   Follow-Up: At [facility], you and your health needs are our priority.  As part of our continuing mission to provide you with exceptional heart care, we have created designated Provider Care Teams.  These Care Teams include your primary Cardiologist (physician) and Advanced Practice Providers (APPs -  Physician Assistants and Nurse Practitioners) who all work together to provide you with the care you need, when you need it.  We recommend signing up for the patient portal called "MyChart".  Sign up information is provided on this After Visit Summary.  MyChart is used to connect with patients for Virtual Visits (Telemedicine).  Patients are able to view lab/test results, encounter notes, upcoming appointments, etc.  Non-urgent messages can be sent to your provider as well.   To learn more about what you can do with MyChart, go to [email or web address].    Your next appointment:   6 month(s)  The format for your next appointment:   In Person  Provider:   You may see Mark Skains, MD or one of the following Advanced Practice Providers on your designated Care Team:    Jill McDaniel, NP    Other Instructions   

## 2020-11-11 NOTE — Progress Notes (Signed)
Virtual Visit via Telephone Note   This visit type was conducted due to national recommendations for restrictions regarding the COVID-19 Pandemic (e.g. social distancing) in an effort to limit this patient's exposure and mitigate transmission in our community.  Due to her co-morbid illnesses, this patient is at least at moderate risk for complications without adequate follow up.  This format is felt to be most appropriate for this patient at this time.  The patient did not have access to video technology/had technical difficulties with video requiring transitioning to audio format only (telephone).  All issues noted in this document were discussed and addressed.  No physical exam could be performed with this format.  Please refer to the patient's chart for her  consent to telehealth for [facility].    Date:  11/11/2020   ID:  [name], DOB [birth year], MRN [id number] The patient was identified using 2 identifiers.  Patient Location: Other:  Independent living facility Provider Location: Home Office   PCP:  Stacie Glaze, [place]  Cardiologist:  Candee Furbish, MD  Electrophysiologist:  Cristopher Peru, MD  [phone]   Evaluation Performed:  Follow-Up Visit  Chief Complaint:  Yearly follow up   History of Present Illness:    [name] is a 75 y.o. female with history of CAD s/p CABG, hypertension, hyperlipidemia, sick sinus syndrome status post pacemaker, COPD, CKD III and CVA seen for follow-up.  Nuclear stress test in 2018 was low risk. Echo in 2018 with normal LVEF.   Reported only one working kidney.   Lives in independent living facility.  Noted slowing down.  Reported shortness of breath with activity which is stable for months.  No associated chest tightness/pressure or palpitation.  She attributes her shortness of breath secondary to COPD.  She does not require oxygen.  Denies dizziness or syncope.  No melena, orthopnea,  lower extremity edema or PND.  The patient does not have symptoms concerning for COVID-19 infection (fever, chills, cough, or new shortness of breath).    Past Medical History:  Diagnosis Date  . Anxiety   . Arthritis    "all over" (07/29/2017)  . Bulging disc    "still have one in my neck" (07/29/2017)  . Carotid arterial disease (HCC)    a. s/p L intracranial ICA stenting ([place]).  . Cataract    "left eye; didn't take it out cause of macular degeneration" (07/29/2017)  . Cervical cancer (HCC)    hx  . Chronic kidney disease, stage III (moderate) ([place])    "only have 1 working kidney" (07/29/2017)  . COPD (chronic obstructive pulmonary disease) ([place])    a. quit smoking in 2007.  Marland Kitchen Coronary artery disease    a. 2007 s/p MI and LAD stenting;  10/2006 CABG x 4: LIMA->LAD, VG->OM->dLCX, VG->RPL;  c. 07/2013 MV: no ischemia.  . Depression   . Esophageal reflux   . Hepatitis 1968   "don't remember which one"  . History of blood transfusion 10/29/2006   "related to bypass OR"  . History of echocardiogram    a. 01/2012 Echo: EF 55%, mildly dil LA/RV, mild MR, Ao Sclerosis, mod TR.  Marland Kitchen Hypothyroid   . Insomnia   . Macular degeneration    "left eye" (07/29/2017)  . Mixed hyperlipidemia   . NSTEMI (non-ST elevated myocardial infarction) ([place]) 08/2006; 10/2006  . Osteoporosis   . Pneumonia    "several times before I quit smoking" (07/29/2017)  .  Presence of permanent cardiac pacemaker   . PVT (paroxysmal ventricular tachycardia) ([place])   . Restless leg syndrome   . Stroke [facility]) ?2008   denies residual on 07/29/2017  . Symptomatic bradycardia    a. s/p BSX Advantio dual chamber PPM Beckie Salts).  . Syncope and collapse   . Unspecified hearing loss    Past Surgical History:  Procedure Laterality Date  . ABDOMINAL HYSTERECTOMY  1970s   "partial"  . ANTERIOR CERVICAL DECOMP/DISCECTOMY FUSION  03/2001; 04/2005;?  date for 3rd OR   "used bone from my hips each time"  . BACK SURGERY    .  CARDIAC CATHETERIZATION  2008  . CATARACT EXTRACTION W/ INTRAOCULAR LENS IMPLANT Right 05/2013  . CEREBRAL ANGIOGRAM Left 02/14/2007   w/ICA stent/notes 11/04/2010  . COLONOSCOPY    . CORONARY ANGIOPLASTY WITH STENT PLACEMENT  08/2006   "2 stents"  . CORONARY ARTERY BYPASS GRAFT  10/2006   "CABG X4"  . EYE SURGERY  05/2013   cataract removed  . INSERT / REPLACE / REMOVE PACEMAKER  03/2012   [place], [place]; [employer]  . KNEE ARTHROSCOPY Bilateral X 7   "4 on my left; 3 on my right" (07/29/2017)  . LAPAROSCOPIC CHOLECYSTECTOMY    . TUBAL LIGATION       Current Meds  Medication Sig  . albuterol (PROVENTIL HFA;VENTOLIN HFA) 108 (90 Base) MCG/ACT inhaler Inhale 1-2 puffs into the lungs every 6 (six) hours as needed for wheezing or shortness of breath.  Marland Kitchen aspirin 81 MG tablet Take 81 mg by mouth daily.  Marland Kitchen atorvastatin (LIPITOR) 40 MG tablet Take 1 tablet (40 mg total) by mouth daily. Please make overdue appt with Dr. Marlou Porch before anymore refills. Thank you 1st attempt  . clonazePAM (KLONOPIN) 1 MG tablet Take 1/2 tab at 4pm, 1/2 tab at bedtime  . Ferrous Sulfate (IRON) 325 (65 Fe) MG TABS Take 1 tablet by mouth daily.  . folic acid (FOLVITE) 1 MG tablet Take 1 mg by mouth daily.  . furosemide (LASIX) 20 MG tablet Take 20 mg by mouth 2 (two) times a day.  Marland Kitchen glimepiride (AMARYL) 2 MG tablet Take 2 mg by mouth daily with breakfast.  . isosorbide mononitrate (IMDUR) 60 MG 24 hr tablet Take 1 tablet (60 mg total) by mouth daily. Please make overdue appt with Dr. Marlou Porch before anymore refills. Thank you 1st attempt  . nitroGLYCERIN (NITROSTAT) 0.4 MG SL tablet Place 0.4 mg under the tongue every 5 (five) minutes as needed for chest pain.  . Oxcarbazepine (TRILEPTAL) 300 MG tablet Take 1 tablet twice daily  . pantoprazole (PROTONIX) 40 MG tablet Take 40 mg by mouth daily.  . pramipexole (MIRAPEX) 1 MG tablet Take 1 tablet every night  . pregabalin (LYRICA) 50 MG capsule Take 1 capsule  at bedtime  . sertraline (ZOLOFT) 100 MG tablet Take 1 tablet (100 mg total) by mouth at bedtime.  . traZODone (DESYREL) 100 MG tablet Take 100 mg by mouth 2 (two) times daily.  Marland Kitchen VITAMIN D PO Take by mouth.  . [DISCONTINUED] hydrOXYzine (ATARAX/VISTARIL) 25 MG tablet Take 25 mg by mouth 3 (three) times daily.     Allergies:   Codeine and Iohexol   Social History   Tobacco Use  . Smoking status: Former Smoker    Packs/day: 0.75    Years: 40.00    Pack years: 30.00    Types: Cigarettes    Start date: 09/28/1965    Quit date: 05/27/2006  Years since quitting: 14.4  . Smokeless tobacco: Never Used  Vaping Use  . Vaping Use: Never used  Substance Use Topics  . Alcohol use: Yes    Alcohol/week: 1.0 standard drink    Types: 1 Glasses of wine per week  . Drug use: No     Family Hx: The patient's family history includes Alcohol abuse in her father; Diabetes in her mother; Early death in her brother; Hyperlipidemia in her mother; Hypertension in her father; Lung cancer in her father; Stomach cancer in her mother.  ROS:   Please see the history of present illness.    All other systems reviewed and are negative.   Prior CV studies:   The following studies were reviewed today:  Stress test 03/2017  There was no ST segment deviation noted during stress.  No T wave inversion was noted during stress.  Defect 1: There is a medium defect of moderate severity present in the basal anteroseptal, basal inferoseptal, mid anteroseptal, mid inferoseptal and apical septal location.  Findings consistent with prior anteroseptal myocardial infarction.  This is a low risk study.  Nuclear stress EF: 61%.  The left ventricular ejection fraction is normal (55-65%).  Echo 07/2017 Left ventricle: The cavity size was normal. Systolic function was  normal. The estimated ejection fraction was in the range of 60%  to 65%.  - Mitral valve: There was mild regurgitation.  - Right ventricle:  The cavity size was moderately dilated. Systolic  function was mildly reduced.  - Right atrium: The atrium was severely dilated.  - Tricuspid valve: There was severe regurgitation.  - Pulmonary arteries: PA peak pressure: 41 mm Hg (S).   Labs/Other Tests and Data Reviewed:    EKG:  No ECG reviewed.  Recent Labs: No results found for requested labs within last 8760 hours.   Recent Lipid Panel Lab Results  Component Value Date/Time   CHOL 168 04/16/2017 04:09 AM   TRIG 98 04/16/2017 04:09 AM   HDL 61 04/16/2017 04:09 AM   CHOLHDL 2.8 04/16/2017 04:09 AM   LDLCALC 87 04/16/2017 04:09 AM    Wt Readings from Last 3 Encounters:  11/11/20 217 lb (98.4 kg)  05/15/20 217 lb (98.4 kg)  02/14/20 218 lb (98.9 kg)     Objective:    Vital Signs:  Ht 5\' 1"  (1.549 m)   Wt 217 lb (98.4 kg)   BMI 41.00 kg/m   Checks blood pressure every 2 weeks.  Reported normal blood pressure at last time checked.  Does not remember reading.  VITAL SIGNS:  reviewed GEN:  no acute distress PSYCH:  normal affect  ASSESSMENT & PLAN:    1. CAD -She attributes her shortness of breath secondary to COPD.  No oxygen requirement.  Her symptoms does not sound anginal.  -Continue aspirin, statin and Imdur  2.  Hyperlipidemia -Continue statin  3.  Hypertension -Reports normal blood pressure       COVID-19 Education: The signs and symptoms of COVID-19 were discussed with the patient and how to seek care for testing (follow up with PCP or arrange E-visit).  The importance of social distancing was discussed today.  Time:   Today, I have spent 10 minutes with the patient with telehealth technology discussing the above problems.     Medication Adjustments/Labs and Tests Ordered: Current medicines are reviewed at length with the patient today.  Concerns regarding medicines are outlined above.   Tests Ordered: No orders of the defined types were placed  in this encounter.   Medication Changes: No  orders of the defined types were placed in this encounter.   Follow Up:  In Person in 6 month(s)  Signed, Leanor Kail, [place]  11/11/2020 9:19 AM    [place]

## 2020-11-12 ENCOUNTER — Other Ambulatory Visit: Payer: Self-pay | Admitting: Neurology

## 2020-11-17 ENCOUNTER — Ambulatory Visit (INDEPENDENT_AMBULATORY_CARE_PROVIDER_SITE_OTHER): Payer: Medicare Other

## 2020-11-17 DIAGNOSIS — I495 Sick sinus syndrome: Secondary | ICD-10-CM

## 2020-11-17 LAB — CUP PACEART REMOTE DEVICE CHECK
Battery Remaining Longevity: 4 mo
Battery Remaining Percentage: 6 %
Brady Statistic RA Percent Paced: 94 %
Brady Statistic RV Percent Paced: 28 %
Date Time Interrogation Session: 20220221025100
Implantable Lead Implant Date: 20130611
Implantable Lead Implant Date: 20130611
Implantable Lead Location: 753859
Implantable Lead Location: 753860
Implantable Lead Model: 4469
Implantable Lead Model: 5076
Implantable Lead Serial Number: 566580
Implantable Pulse Generator Implant Date: 20130611
Lead Channel Impedance Value: 415 Ohm
Lead Channel Impedance Value: 598 Ohm
Lead Channel Pacing Threshold Amplitude: 0.4 V
Lead Channel Pacing Threshold Pulse Width: 0.4 ms
Lead Channel Setting Pacing Amplitude: 2 V
Lead Channel Setting Pacing Amplitude: 2.4 V
Lead Channel Setting Pacing Pulse Width: 0.4 ms
Lead Channel Setting Sensing Sensitivity: 2.5 mV
Pulse Gen Serial Number: 131914

## 2020-11-24 NOTE — Progress Notes (Signed)
Remote pacemaker transmission.   

## 2020-12-08 ENCOUNTER — Other Ambulatory Visit: Payer: Self-pay | Admitting: Cardiology

## 2020-12-12 ENCOUNTER — Ambulatory Visit: Payer: Medicare Other | Admitting: Neurology

## 2020-12-12 ENCOUNTER — Other Ambulatory Visit: Payer: Self-pay

## 2020-12-12 ENCOUNTER — Encounter: Payer: Self-pay | Admitting: Neurology

## 2020-12-12 VITALS — BP 112/68 | HR 79 | Resp 20 | Ht 61.0 in | Wt 228.0 lb

## 2020-12-12 DIAGNOSIS — G2581 Restless legs syndrome: Secondary | ICD-10-CM

## 2020-12-12 DIAGNOSIS — R404 Transient alteration of awareness: Secondary | ICD-10-CM | POA: Diagnosis not present

## 2020-12-12 DIAGNOSIS — R413 Other amnesia: Secondary | ICD-10-CM

## 2020-12-12 NOTE — Patient Instructions (Signed)
1. Schedule Neurocognitive testing  2. Bloodwork from your PCP will be requested for review  3. Follow-up with Cardiology and Pulmonary, PCP, and Ortho   4. Continue all your medications
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
## 2020-12-17 ENCOUNTER — Ambulatory Visit: Payer: Medicare Other | Admitting: Orthopaedic Surgery

## 2020-12-22 ENCOUNTER — Ambulatory Visit (INDEPENDENT_AMBULATORY_CARE_PROVIDER_SITE_OTHER): Payer: Medicare Other

## 2020-12-22 DIAGNOSIS — I495 Sick sinus syndrome: Secondary | ICD-10-CM

## 2020-12-22 LAB — CUP PACEART REMOTE DEVICE CHECK
Battery Remaining Longevity: 3 mo — CL
Battery Remaining Percentage: 2 %
Brady Statistic RA Percent Paced: 95 %
Brady Statistic RV Percent Paced: 33 %
Date Time Interrogation Session: 20220328025300
Implantable Lead Implant Date: 20130611
Implantable Lead Implant Date: 20130611
Implantable Lead Location: 753859
Implantable Lead Location: 753860
Implantable Lead Model: 4469
Implantable Lead Model: 5076
Implantable Lead Serial Number: 566580
Implantable Pulse Generator Implant Date: 20130611
Lead Channel Impedance Value: 388 Ohm
Lead Channel Impedance Value: 588 Ohm
Lead Channel Pacing Threshold Amplitude: 0.4 V
Lead Channel Pacing Threshold Pulse Width: 0.4 ms
Lead Channel Setting Pacing Amplitude: 2 V
Lead Channel Setting Pacing Amplitude: 2.4 V
Lead Channel Setting Pacing Pulse Width: 0.4 ms
Lead Channel Setting Sensing Sensitivity: 2.5 mV
Pulse Gen Serial Number: 131914

## 2021-01-02 NOTE — Progress Notes (Signed)
Remote pacemaker transmission.   

## 2021-01-26 ENCOUNTER — Ambulatory Visit (INDEPENDENT_AMBULATORY_CARE_PROVIDER_SITE_OTHER): Payer: Medicare Other

## 2021-01-26 DIAGNOSIS — I495 Sick sinus syndrome: Secondary | ICD-10-CM

## 2021-01-27 LAB — CUP PACEART REMOTE DEVICE CHECK
Battery Remaining Longevity: 3 mo — CL
Battery Remaining Percentage: 2 %
Brady Statistic RA Percent Paced: 95 %
Brady Statistic RV Percent Paced: 35 %
Date Time Interrogation Session: 20220502111800
Implantable Lead Implant Date: 20130611
Implantable Lead Implant Date: 20130611
Implantable Lead Location: 753859
Implantable Lead Location: 753860
Implantable Lead Model: 4469
Implantable Lead Model: 5076
Implantable Lead Serial Number: 566580
Implantable Pulse Generator Implant Date: 20130611
Lead Channel Impedance Value: 402 Ohm
Lead Channel Impedance Value: 605 Ohm
Lead Channel Pacing Threshold Amplitude: 0.4 V
Lead Channel Pacing Threshold Pulse Width: 0.4 ms
Lead Channel Setting Pacing Amplitude: 2 V
Lead Channel Setting Pacing Amplitude: 2.4 V
Lead Channel Setting Pacing Pulse Width: 0.4 ms
Lead Channel Setting Sensing Sensitivity: 2.5 mV
Pulse Gen Serial Number: 131914

## 2021-02-06 ENCOUNTER — Telehealth: Payer: Self-pay | Admitting: Internal Medicine

## 2021-02-06 NOTE — Telephone Encounter (Signed)
Returned patients phone call. Advised patient that her device has not reached replacement time yet, but has <3 months left until approximate time to explant. Advised next remote would be on 03/02/21. Verbalized understanding. Appreciative for call.

## 2021-02-06 NOTE — Telephone Encounter (Signed)
Calling patient to advise her remote reports she has approximately 3 months until time to explant.   No answer, LMOVM (generic).

## 2021-02-06 NOTE — Telephone Encounter (Signed)
PT is returning a call 

## 2021-02-06 NOTE — Telephone Encounter (Signed)
     1. Has your device fired?   2. Is you device beeping?   3. Are you experiencing draining or swelling at device site?   4. Are you calling to see if we received your device transmission?   5. Have you passed out?   Pt said she recently send a transmission and she wanted to know how much longer her battery will last since it is almost out    Please route to [place]

## 2021-02-09 ENCOUNTER — Other Ambulatory Visit: Payer: Self-pay | Admitting: Neurology

## 2021-02-13 NOTE — Addendum Note (Signed)
Addended by: Cheri Kearns A on: 02/13/2021 07:55 PM   Modules accepted: Level of Service

## 2021-02-13 NOTE — Progress Notes (Signed)
Remote pacemaker transmission.   

## 2021-03-02 ENCOUNTER — Ambulatory Visit (INDEPENDENT_AMBULATORY_CARE_PROVIDER_SITE_OTHER): Payer: Medicare Other

## 2021-03-02 DIAGNOSIS — I495 Sick sinus syndrome: Secondary | ICD-10-CM | POA: Diagnosis not present

## 2021-03-03 ENCOUNTER — Telehealth: Payer: Self-pay | Admitting: Internal Medicine

## 2021-03-03 NOTE — Telephone Encounter (Signed)
  1. Has your device fired? no  2. Is you device beeping? No, blinking orange  3. Are you experiencing draining or swelling at device site? no  4. Are you calling to see if we received your device transmission? no  5. Have you passed out? No   Patient states she tried to send a transmission, but the device is blinking orange now.    Please route to [place]

## 2021-03-03 NOTE — Telephone Encounter (Signed)
I called [facility] to get additional help and she is sending the patient a new adapter for her monitor.

## 2021-03-04 LAB — CUP PACEART REMOTE DEVICE CHECK
Battery Remaining Longevity: 3 mo — CL
Battery Remaining Percentage: 3 %
Brady Statistic RA Percent Paced: 95 %
Brady Statistic RV Percent Paced: 37 %
Date Time Interrogation Session: 20220607132600
Implantable Lead Implant Date: 20130611
Implantable Lead Implant Date: 20130611
Implantable Lead Location: 753859
Implantable Lead Location: 753860
Implantable Lead Model: 4469
Implantable Lead Model: 5076
Implantable Lead Serial Number: 566580
Implantable Pulse Generator Implant Date: 20130611
Lead Channel Impedance Value: 429 Ohm
Lead Channel Impedance Value: 615 Ohm
Lead Channel Pacing Threshold Amplitude: 0.4 V
Lead Channel Pacing Threshold Pulse Width: 0.4 ms
Lead Channel Setting Pacing Amplitude: 2 V
Lead Channel Setting Pacing Amplitude: 2.4 V
Lead Channel Setting Pacing Pulse Width: 0.4 ms
Lead Channel Setting Sensing Sensitivity: 2.5 mV
Pulse Gen Serial Number: 131914

## 2021-03-06 ENCOUNTER — Other Ambulatory Visit: Payer: Self-pay | Admitting: Neurology

## 2021-03-09 ENCOUNTER — Encounter: Payer: Medicare Other | Admitting: Counselor

## 2021-03-17 ENCOUNTER — Encounter: Payer: Medicare Other | Admitting: Counselor

## 2021-03-24 NOTE — Progress Notes (Signed)
Remote pacemaker transmission.   

## 2021-04-06 ENCOUNTER — Ambulatory Visit (INDEPENDENT_AMBULATORY_CARE_PROVIDER_SITE_OTHER): Payer: Medicare Other

## 2021-04-06 ENCOUNTER — Other Ambulatory Visit: Payer: Self-pay | Admitting: Neurology

## 2021-04-06 DIAGNOSIS — I495 Sick sinus syndrome: Secondary | ICD-10-CM

## 2021-04-06 LAB — CUP PACEART REMOTE DEVICE CHECK
Battery Remaining Longevity: 3 mo — CL
Battery Remaining Percentage: 0 %
Brady Statistic RA Percent Paced: 95 %
Brady Statistic RV Percent Paced: 38 %
Date Time Interrogation Session: 20220711085700
Implantable Lead Implant Date: 20130611
Implantable Lead Implant Date: 20130611
Implantable Lead Location: 753859
Implantable Lead Location: 753860
Implantable Lead Model: 4469
Implantable Lead Model: 5076
Implantable Lead Serial Number: 566580
Implantable Pulse Generator Implant Date: 20130611
Lead Channel Impedance Value: 413 Ohm
Lead Channel Impedance Value: 603 Ohm
Lead Channel Pacing Threshold Amplitude: 0.4 V
Lead Channel Pacing Threshold Pulse Width: 0.4 ms
Lead Channel Setting Pacing Amplitude: 2 V
Lead Channel Setting Pacing Amplitude: 2.4 V
Lead Channel Setting Pacing Pulse Width: 0.4 ms
Lead Channel Setting Sensing Sensitivity: 2.5 mV
Pulse Gen Serial Number: 131914

## 2021-04-09 ENCOUNTER — Telehealth: Payer: Self-pay

## 2021-04-09 NOTE — Telephone Encounter (Signed)
Latitude alert received that device has reached RRT 04/08/21. Patient called and advised a scheduler will call to set up apt. In clinic to speak with Dr. Lovena Le about a gen change. Patient verbalized understanding.

## 2021-04-30 ENCOUNTER — Encounter: Payer: Medicare Other | Admitting: Student

## 2021-04-30 NOTE — Progress Notes (Signed)
Remote pacemaker transmission.   

## 2021-06-10 ENCOUNTER — Ambulatory Visit: Payer: Medicare Other | Admitting: Neurology

## 2021-06-15 ENCOUNTER — Ambulatory Visit (INDEPENDENT_AMBULATORY_CARE_PROVIDER_SITE_OTHER): Payer: Medicare Other

## 2021-06-15 DIAGNOSIS — I495 Sick sinus syndrome: Secondary | ICD-10-CM | POA: Diagnosis not present

## 2021-06-16 ENCOUNTER — Telehealth: Payer: Self-pay

## 2021-06-16 LAB — CUP PACEART REMOTE DEVICE CHECK
Brady Statistic RA Percent Paced: 95 %
Brady Statistic RV Percent Paced: 39 %
Date Time Interrogation Session: 20220919025100
Implantable Lead Implant Date: 20130611
Implantable Lead Implant Date: 20130611
Implantable Lead Location: 753859
Implantable Lead Location: 753860
Implantable Lead Model: 4469
Implantable Lead Model: 5076
Implantable Lead Serial Number: 566580
Implantable Pulse Generator Implant Date: 20130611
Lead Channel Impedance Value: 400 Ohm
Lead Channel Impedance Value: 580 Ohm
Lead Channel Pacing Threshold Amplitude: 0.4 V
Lead Channel Pacing Threshold Pulse Width: 0.4 ms
Lead Channel Setting Pacing Amplitude: 2 V
Lead Channel Setting Pacing Amplitude: 2.4 V
Lead Channel Setting Pacing Pulse Width: 0.4 ms
Lead Channel Setting Sensing Sensitivity: 2.5 mV
Pulse Gen Serial Number: 131914

## 2021-06-16 NOTE — Telephone Encounter (Signed)
LOV 11/2019.  Device reached RRT on 04/08/21.  Pt was scheduled for OV on 8/15 (no show).  She called today to reschedule.  She is currently scheduled for appt on 06/29/21.    Pt is device dependant per 11/2019 in clinic check- VP@40 .  Although current pacing percentages show AP 95% and VP 39%.    Spoke with pt.  Advised given the remaining battery life on her device and her dependency on it, she should not wait until October to be seen.  There is an opening this week with Oda Kilts, PA.  She will try to find a ride and callback to confirm if she can make it.

## 2021-06-17 NOTE — Progress Notes (Signed)
Electrophysiology Office Note Date: 06/18/2021  ID:  [name], [name] August 28, 1946, MRN [id number]  PCP: No primary care provider on file. Primary Cardiologist: Candee Furbish, MD Electrophysiologist: Cristopher Peru, MD   CC: Pacemaker follow-up  [name] is a 75 y.o. female seen today for Cristopher Peru, MD for routine electrophysiology followup.  Since last being seen in our clinic the patient reports doing OK. She had hip surgery several months ago and has been doing rehab to get stronger. No chest pain, SOB, or syncope.     Device History: Engineer, agricultural PPM implanted 2013 for SND  Past Medical History:  Diagnosis Date   Anxiety    Arthritis    "all over" (07/29/2017)   Bulging disc    "still have one in my neck" (07/29/2017)   Carotid arterial disease ([place])    a. s/p L intracranial ICA stenting ([place]).   Cataract    "left eye; didn't take it out cause of macular degeneration" (07/29/2017)   Cervical cancer ([place])    hx   Chronic kidney disease, stage III (moderate) ([place])    "only have 1 working kidney" (07/29/2017)   COPD (chronic obstructive pulmonary disease) ([place])    a. quit smoking in 2007.   Coronary artery disease    a. 2007 s/p MI and LAD stenting;  10/2006 CABG x 4: LIMA->LAD, VG->OM->dLCX, VG->RPL;  c. 07/2013 MV: no ischemia.   Depression    Esophageal reflux    Hepatitis 1968   "don't remember which one"   History of blood transfusion 10/29/2006   "related to bypass OR"   History of echocardiogram    a. 01/2012 Echo: EF 55%, mildly dil LA/RV, mild MR, Ao Sclerosis, mod TR.   Hypothyroid    Insomnia    Macular degeneration    "left eye" (07/29/2017)   Mixed hyperlipidemia    NSTEMI (non-ST elevated myocardial infarction) ([place]) 08/2006; 10/2006   Osteoporosis    Pneumonia    "several times before I quit smoking" (07/29/2017)   Presence of permanent cardiac pacemaker    PVT (paroxysmal ventricular tachycardia) ([place])    Restless leg  syndrome    Stroke [facility]) ?2008   denies residual on 07/29/2017   Symptomatic bradycardia    a. s/p BSX [employer]vantio dual chamber PPM Beckie Salts).   Syncope and collapse    Unspecified hearing loss    Past Surgical History:  Procedure Laterality Date   ABDOMINAL HYSTERECTOMY  1970s   "partial"   ANTERIOR CERVICAL DECOMP/DISCECTOMY FUSION  03/2001; 04/2005;?  date for 3rd OR   "used bone from my hips each time"   [place]  2008   CATARACT EXTRACTION W/ INTRAOCULAR LENS IMPLANT Right 05/2013   CEREBRAL ANGIOGRAM Left 02/14/2007   w/ICA stent/notes 11/04/2010   COLONOSCOPY     CORONARY ANGIOPLASTY WITH STENT PLACEMENT  08/2006   "2 stents"   CORONARY ARTERY BYPASS GRAFT  10/2006   "CABG X4"   EYE SURGERY  05/2013   cataract removed   INSERT / REPLACE / REMOVE PACEMAKER  03/2012   [place], [place]; [place];[employer]   KNEE ARTHROSCOPY Bilateral X 7   "4 on my left; 3 on my right" (07/29/2017)   LAPAROSCOPIC CHOLECYSTECTOMY     TUBAL LIGATION      Current Outpatient Medications  Medication Sig Dispense Refill   albuterol (PROVENTIL HFA;VENTOLIN HFA) 108 (90 Base) MCG/ACT inhaler Inhale 1-2 puffs into the  lungs every 6 (six) hours as needed for wheezing or shortness of breath. 1 Inhaler 5   aspirin 81 MG tablet Take 81 mg by mouth daily.     atorvastatin (LIPITOR) 40 MG tablet TAKE ONE (1) TABLET BY MOUTH EACH DAY 90 tablet 3   clonazePAM (KLONOPIN) 1 MG tablet TAKE 1/2 TABLET BY MOUTH AT 4PM AND 1/2 TABLET AT BEDTIME 90 tablet 0   Ferrous Sulfate (IRON) 325 (65 Fe) MG TABS Take 1 tablet by mouth daily.     folic acid (FOLVITE) 1 MG tablet Take 1 mg by mouth daily.     furosemide (LASIX) 20 MG tablet Take 20 mg by mouth 2 (two) times a day.     glimepiride (AMARYL) 2 MG tablet Take 2 mg by mouth daily with breakfast.     isosorbide mononitrate (IMDUR) 60 MG 24 hr tablet TAKE ONE (1) TABLET BY MOUTH EACH DAY 90 tablet 3   metoprolol tartrate (LOPRESSOR) 25  MG tablet Take 25 mg by mouth 2 (two) times daily.     nitroGLYCERIN (NITROSTAT) 0.4 MG SL tablet Place 0.4 mg under the tongue every 5 (five) minutes as needed for chest pain.     Oxcarbazepine (TRILEPTAL) 300 MG tablet Take 1 tablet twice daily 180 tablet 3   pantoprazole (PROTONIX) 40 MG tablet Take 40 mg by mouth daily.     pramipexole (MIRAPEX) 1 MG tablet Take 1 tablet every night 90 tablet 3   pregabalin (LYRICA) 50 MG capsule Take 1 capsule at bedtime 30 capsule 5   sertraline (ZOLOFT) 100 MG tablet TAKE 1 TABLET BY MOUTH AT BEDTIME 30 tablet 0   traZODone (DESYREL) 100 MG tablet Take 100 mg by mouth 2 (two) times daily.     VITAMIN D PO Take by mouth.     No current facility-administered medications for this visit.    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 @ [employer]  Tobacco Use   Smoking status: Former    Packs/day: 0.75    Years: 40.00    Pack years: 30.00    Types: Cigarettes    Start date: 09/28/1965    Quit date: 05/27/2006    Years since quitting: 15.0   Smokeless tobacco: Never  Vaping Use   Vaping Use: Never used  Substance and Sexual Activity   Alcohol use: Yes    Alcohol/week: 1.0 standard drink    Types: 1 Glasses of wine per week   Drug use: No   Sexual activity: Not Currently  Other Topics Concern   Not on file  Social History Narrative   Pt is right handed    Lives alone in single story home within retirement community   Has 2 adult sons   Some college education   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  Physical Exam: Vitals:   06/18/21 1046  BP: 122/64  Pulse: 70  Weight: 204 lb (92.5 kg)  Height: 5\' 1"  (1.549 m)     GEN- [name] well appearing, alert and oriented x 3 today.   HEENT: normocephalic, atraumatic; sclera clear, conjunctiva pink; hearing intact; oropharynx clear; neck supple  Lungs- Clear to ausculation bilaterally, normal work of breathing.  No wheezes, rales, rhonchi Heart- Regular rate and rhythm, no murmurs, rubs or gallops  GI- soft, non-tender, non-distended, bowel sounds present  Extremities- no clubbing or cyanosis. No edema MS- no significant deformity or atrophy Skin- warm and dry, no rash or lesion; PPM pocket well healed Psych- euthymic mood, full affect Neuro- strength and sensation are intact  PPM Interrogation- reviewed in detail today,  See PACEART report  EKG:  EKG is ordered today. Personal review of ekg ordered today shows NSR at 70 bpm (Likely A paced)   Recent Labs: No results found for requested labs within last 8760 hours.   Wt Readings from Last 3 Encounters:  06/18/21 204 lb (92.5 kg)  12/12/20 228 lb (103.4 kg)  11/11/20 217 lb (98.4 kg)     Other studies Reviewed: Additional studies/ records that were reviewed today include: Previous EP office notes, Previous remote checks, Most recent labwork.   Assessment and Plan:  1. SND s/p [employer] PPM  Normal PPM function for device at ERI as of 04/08/21 (Confirmed with [facility] that we should go by original date despite  subsequent checks pushing her ERI date up to 04/27/21) See Pace Art report No changes today  2. CAD s/p PCI/CABG Denies anginal symptoms  3. Morbid Obesity Body mass index is 38.55 kg/m.  Encouraged lifestyle modification as she continues to recover from hip  Current medicines are reviewed at length with the patient today.    Labs/ tests ordered today include:  Orders Placed This Encounter  Procedures   Basic metabolic panel   CBC   CUP PACEART INCLINIC DEVICE CHECK   EKG 12-Lead    Disposition:   Follow up with Dr. Lovena Le for gen change and usual post care.  Jacalyn Lefevre, PA-C  06/18/2021 11:17 AM  [facility] [address] [place] [address] [phone] (office) [phone] (fax)

## 2021-06-18 ENCOUNTER — Ambulatory Visit (INDEPENDENT_AMBULATORY_CARE_PROVIDER_SITE_OTHER): Payer: Medicare Other | Admitting: Student

## 2021-06-18 ENCOUNTER — Other Ambulatory Visit: Payer: Self-pay

## 2021-06-18 VITALS — BP 122/64 | HR 70 | Ht 61.0 in | Wt 204.0 lb

## 2021-06-18 DIAGNOSIS — I495 Sick sinus syndrome: Secondary | ICD-10-CM

## 2021-06-18 DIAGNOSIS — Z45018 Encounter for adjustment and management of other part of cardiac pacemaker: Secondary | ICD-10-CM

## 2021-06-18 LAB — CUP PACEART INCLINIC DEVICE CHECK
Date Time Interrogation Session: 20220922110905
Implantable Lead Implant Date: 20130611
Implantable Lead Implant Date: 20130611
Implantable Lead Location: 753859
Implantable Lead Location: 753860
Implantable Lead Model: 4469
Implantable Lead Model: 5076
Implantable Lead Serial Number: 566580
Implantable Pulse Generator Implant Date: 20130611
Lead Channel Impedance Value: 398 Ohm
Lead Channel Impedance Value: 581 Ohm
Lead Channel Pacing Threshold Amplitude: 0.6 V
Lead Channel Pacing Threshold Amplitude: 0.9 V
Lead Channel Pacing Threshold Pulse Width: 0.4 ms
Lead Channel Pacing Threshold Pulse Width: 0.4 ms
Lead Channel Sensing Intrinsic Amplitude: 1.3 mV
Lead Channel Sensing Intrinsic Amplitude: 3.5 mV
Lead Channel Setting Pacing Amplitude: 2 V
Lead Channel Setting Pacing Amplitude: 2.4 V
Lead Channel Setting Pacing Pulse Width: 0.4 ms
Lead Channel Setting Sensing Sensitivity: 2.5 mV
Pulse Gen Serial Number: 131914

## 2021-06-18 LAB — CBC
Hematocrit: 32.6 % — ABNORMAL LOW (ref 34.0–46.6)
Hemoglobin: 11.1 g/dL (ref 11.1–15.9)
MCH: 27.7 pg (ref 26.6–33.0)
MCHC: 34 g/dL (ref 31.5–35.7)
MCV: 81 fL (ref 79–97)
Platelets: 204 10*3/uL (ref 150–450)
RBC: 4.01 x10E6/uL (ref 3.77–5.28)
RDW: 14.3 % (ref 11.7–15.4)
WBC: 7.2 10*3/uL (ref 3.4–10.8)

## 2021-06-18 LAB — BASIC METABOLIC PANEL
BUN/Creatinine Ratio: 11 — ABNORMAL LOW (ref 12–28)
BUN: 13 mg/dL (ref 8–27)
CO2: 26 mmol/L (ref 20–29)
Calcium: 9.5 mg/dL (ref 8.7–10.3)
Chloride: 102 mmol/L (ref 96–106)
Creatinine, Ser: 1.16 mg/dL — ABNORMAL HIGH (ref 0.57–1.00)
Glucose: 66 mg/dL (ref 65–99)
Potassium: 4.1 mmol/L (ref 3.5–5.2)
Sodium: 142 mmol/L (ref 134–144)
eGFR: 49 mL/min/{1.73_m2} — ABNORMAL LOW (ref >59)

## 2021-06-18 NOTE — Patient Instructions (Signed)
Medication Instructions: Your physician recommends that you continue on your current medications as directed. Please refer to the Current Medication list given to you today.  Labwork: Your physician has recommended that you have lab work today: BMET and CBC  Procedures/Testing: Your physician has recommended that you have a Generator Change of your device on 06/29/2021. This is a procedure that replaces a Pacemaker ICD generator that is at the end of its service life. The remaining lifespan of a pacemaker is determined during visits to the [facility]. The battery in a pacemaker does not stop suddenly but rather loses its charge slowly, which lets the cardiologist plan the replacement date.  Follow-Up: Your physician recommends that you schedule a follow-up appointment in 10 - 14 days from 06/29/2021 with the Device clinic for a wound check  Your physician recommends that you schedule a follow-up appointment in 3 months from 06/29/2021 with Dr. Lovena Le.   If you need a refill on your cardiac medications before your next appointment, please call your pharmacy.   -------------------------------------------------------------------------------------------------------------  Please wash with the CHG Soap the night before and morning of procedure (follow instruction page "Preparing For Surgery").   Please report to the [place] [facility] of [facility] at 5:30am ([place], [place] [id number])  DO NOT eat or drink anything after midnight the night before procedure.  Hold ALL of your morning medications the day of your procedure.   You will need someone to drive you home after the procedure  --------------------------------------------------------------------------------------------------------------- [facility] - Preparing For Surgery  Before surgery, you can play an important role. Because skin is not sterile, your skin needs to be as free of  germs as possible. You can reduce the number of germs on your skin by washing with CHG (chlorahexidine gluconate) Soap before surgery.  CHG is an antiseptic cleaner which kills germs and bonds with the skin to continue killing germs even after washing.   Please do not use if you have an allergy to CHG or antibacterial soaps.  If your skin becomes reddened/irritated stop using the CHG.   Do not shave (including legs and underarms) for at least 48 hours prior to first CHG shower.  It is OK to shave your face.  Please follow these instructions carefully:  1.  Shower the night before surgery and the morning of surgery with CHG.  2.  If you choose to wash your hair, wash your hair first as usual with your normal shampoo.  3.  After you shampoo, rinse your hair and body thoroughly to remove the shampoo.  4.  Use CHG as you would any other liquid soap.  You can apply CHG directly to the skin and wash gently with a clean washcloth. 5.  Apply the CHG Soap to your body ONLY FROM THE NECK DOWN.  Do not use on open wounds or open sores.  Avoid contact with your eyes, ears, mouth and genitals (private parts).  Wash genitals (private parts) with your normal soap.  6.  Wash thoroughly, paying special attention to the area where your surgery will be performed.  7.  Thoroughly rinse your body with warm water from the neck down.   8.  DO NOT shower/wash with your normal soap after using and rinsing off the CHG soap.  9.  Pat yourself dry with a clean towel.           10.  Wear clean pajamas.  11.  Place clean sheets on your bed the night of your first shower and do not sleep with pets.  Day of Surgery: Do not apply any deodorants/lotions.  Please wear clean clothes to the hospital/surgery center.

## 2021-06-19 NOTE — Progress Notes (Signed)
Remote pacemaker transmission.   

## 2021-06-25 ENCOUNTER — Telehealth: Payer: Self-pay | Admitting: Internal Medicine

## 2021-06-25 ENCOUNTER — Telehealth: Payer: Self-pay

## 2021-06-25 NOTE — Telephone Encounter (Signed)
Returned call.  Pt was unable to reach her son,  but she said go ahead and reschedule her for arrival at 9:30 am.  She will call back if it is a problem.

## 2021-06-25 NOTE — Telephone Encounter (Signed)
Pt called back to report her son could have her to [facility] by 10:30 am on procedure day.  Advised Pt that would be fine.

## 2021-06-25 NOTE — Telephone Encounter (Signed)
Patient called and said she can not do the gen change at 9:30 on 06/29/21. She said her Son can not get her there

## 2021-06-25 NOTE — Telephone Encounter (Signed)
Call placed to Pt.  Need to reschedule procedure to 11:30 am.  Pt is being driven by her 2 younger sons.  She will call and see if they can bring Pt in earlier.

## 2021-06-29 ENCOUNTER — Encounter (HOSPITAL_COMMUNITY): Payer: Self-pay | Admitting: Internal Medicine

## 2021-06-29 ENCOUNTER — Ambulatory Visit (HOSPITAL_COMMUNITY)
Admission: RE | Admit: 2021-06-29 | Discharge: 2021-06-29 | Disposition: A | Discharge status: Home / Self Care | Payer: Medicare Other | Source: Ambulatory Visit | Attending: Internal Medicine | Admitting: Internal Medicine

## 2021-06-29 ENCOUNTER — Encounter (HOSPITAL_COMMUNITY)
Admission: RE | Disposition: A | Discharge status: Home / Self Care | Payer: Self-pay | Source: Ambulatory Visit | Attending: Internal Medicine

## 2021-06-29 ENCOUNTER — Other Ambulatory Visit: Payer: Self-pay

## 2021-06-29 ENCOUNTER — Encounter: Payer: Medicare Other | Admitting: Physician Assistant

## 2021-06-29 DIAGNOSIS — Z87891 Personal history of nicotine dependence: Secondary | ICD-10-CM | POA: Insufficient documentation

## 2021-06-29 DIAGNOSIS — Z951 Presence of aortocoronary bypass graft: Secondary | ICD-10-CM | POA: Insufficient documentation

## 2021-06-29 DIAGNOSIS — I2510 Atherosclerotic heart disease of native coronary artery without angina pectoris: Secondary | ICD-10-CM | POA: Insufficient documentation

## 2021-06-29 DIAGNOSIS — Z7984 Long term (current) use of oral hypoglycemic drugs: Secondary | ICD-10-CM | POA: Diagnosis not present

## 2021-06-29 DIAGNOSIS — I495 Sick sinus syndrome: Secondary | ICD-10-CM | POA: Diagnosis not present

## 2021-06-29 DIAGNOSIS — Z6838 Body mass index (BMI) 38.0-38.9, adult: Secondary | ICD-10-CM | POA: Diagnosis not present

## 2021-06-29 DIAGNOSIS — Z79899 Other long term (current) drug therapy: Secondary | ICD-10-CM | POA: Diagnosis not present

## 2021-06-29 DIAGNOSIS — Z7982 Long term (current) use of aspirin: Secondary | ICD-10-CM | POA: Diagnosis not present

## 2021-06-29 DIAGNOSIS — Z885 Allergy status to narcotic agent status: Secondary | ICD-10-CM | POA: Insufficient documentation

## 2021-06-29 DIAGNOSIS — Z45010 Encounter for checking and testing of cardiac pacemaker pulse generator [battery]: Secondary | ICD-10-CM | POA: Diagnosis not present

## 2021-06-29 DIAGNOSIS — Z888 Allergy status to other drugs, medicaments and biological substances status: Secondary | ICD-10-CM | POA: Diagnosis not present

## 2021-06-29 HISTORY — PX: PPM GENERATOR CHANGEOUT: EP1233

## 2021-06-29 SURGERY — PPM GENERATOR CHANGEOUT

## 2021-06-29 MED ORDER — CHLORHEXIDINE GLUCONATE 4 % EX LIQD: 4.0000 "application " | Freq: Once | CUTANEOUS | Status: DC

## 2021-06-29 MED ORDER — SODIUM CHLORIDE 0.9 % IV SOLN
INTRAVENOUS | Status: AC
  Filled 2021-06-29: qty 2

## 2021-06-29 MED ORDER — CEFAZOLIN SODIUM-DEXTROSE 2-4 GM/100ML-% IV SOLN
INTRAVENOUS | Status: AC
  Filled 2021-06-29: qty 100

## 2021-06-29 MED ORDER — SODIUM CHLORIDE 0.9 % IV SOLN: INTRAVENOUS | Status: DC

## 2021-06-29 MED ORDER — CEFAZOLIN SODIUM-DEXTROSE 2-4 GM/100ML-% IV SOLN
2.0000 g | INTRAVENOUS | Status: AC
  Administered 2021-06-29: 2 g via INTRAVENOUS

## 2021-06-29 MED ORDER — LIDOCAINE HCL (PF) 1 % IJ SOLN
INTRAMUSCULAR | Status: DC | PRN
  Administered 2021-06-29: 60 mL

## 2021-06-29 MED ORDER — SODIUM CHLORIDE 0.9 % IV SOLN
80.0000 mg | INTRAVENOUS | Status: AC
  Administered 2021-06-29: 80 mg

## 2021-06-29 MED ORDER — ONDANSETRON HCL 4 MG/2ML IJ SOLN: 4.0000 mg | Freq: Four times a day (QID) | INTRAMUSCULAR | Status: DC | PRN

## 2021-06-29 MED ORDER — LIDOCAINE HCL (PF) 1 % IJ SOLN
INTRAMUSCULAR | Status: AC
  Filled 2021-06-29: qty 60

## 2021-06-29 MED ORDER — ACETAMINOPHEN 325 MG PO TABS
325.0000 mg | ORAL_TABLET | ORAL | Status: DC | PRN
  Filled 2021-06-29: qty 2

## 2021-06-29 SURGICAL SUPPLY — 5 items
CABLE SURGICAL S-101-97-12 (CABLE) ×2 IMPLANT
MAT PREVALON FULL STRYKER (MISCELLANEOUS) ×1 IMPLANT
PACEMAKER ACCOLADE DR-EL (Pacemaker) ×1 IMPLANT
PAD PRO RADIOLUCENT 2001M-C (PAD) ×2 IMPLANT
TRAY PACEMAKER INSERTION (PACKS) ×2 IMPLANT

## 2021-06-29 NOTE — Progress Notes (Signed)
Client c/o , "restless legs" . Andy,PA in to see client and heat packs to legs per Andy,PA. No other orders and ok to d/c home

## 2021-06-29 NOTE — H&P (Signed)
Electrophysiology Office Note Date: 06/18/2021   ID:  [place], [place] 1945/10/29, MRN [id number]   PCP: No primary care provider on file. Primary Cardiologist: Candee Furbish, MD Electrophysiologist: Cristopher Peru, MD    CC: Pacemaker follow-up   [name] is a 75 y.o. female seen today for Cristopher Peru, MD for routine electrophysiology followup.  Since last being seen in our clinic the patient reports doing OK. She had hip surgery several months ago and has been doing rehab to get stronger. No chest pain, SOB, or syncope.      Device History: Engineer, agricultural PPM implanted 2013 for SND       Past Medical History:  Diagnosis Date   Anxiety     Arthritis      "all over" (07/29/2017)   Bulging disc      "still have one in my neck" (07/29/2017)   Carotid arterial disease ([place])      a. s/p L intracranial ICA stenting ([place]).   Cataract      "left eye; didn't take it out cause of macular degeneration" (07/29/2017)   Cervical cancer ([place])      hx   Chronic kidney disease, stage III (moderate) ([place])      "only have 1 working kidney" (07/29/2017)   COPD (chronic obstructive pulmonary disease) ([place])      a. quit smoking in 2007.   Coronary artery disease      a. 2007 s/p MI and LAD stenting;  10/2006 CABG x 4: LIMA->LAD, VG->OM->dLCX, VG->RPL;  c. 07/2013 MV: no ischemia.   Depression     Esophageal reflux     Hepatitis 1968    "don't remember which one"   History of blood transfusion 10/29/2006    "related to bypass OR"   History of echocardiogram      a. 01/2012 Echo: EF 55%, mildly dil LA/RV, mild MR, Ao Sclerosis, mod TR.   Hypothyroid     Insomnia     Macular degeneration      "left eye" (07/29/2017)   Mixed hyperlipidemia     NSTEMI (non-ST elevated myocardial infarction) ([place]) 08/2006; 10/2006   Osteoporosis     Pneumonia      "several times before I quit smoking" (07/29/2017)   Presence of permanent cardiac pacemaker     PVT (paroxysmal  ventricular tachycardia) ([place])     Restless leg syndrome     Stroke [facility]) ?2008    denies residual on 07/29/2017   Symptomatic bradycardia      a. s/p BSX [employer] dual chamber PPM Beckie Salts).   Syncope and collapse     Unspecified hearing loss           Past Surgical History:  Procedure Laterality Date   ABDOMINAL HYSTERECTOMY   1970s    "partial"   ANTERIOR CERVICAL DECOMP/DISCECTOMY FUSION   03/2001; 04/2005;?  date for 3rd OR    "used bone from my hips each time"   [place]   2008   CATARACT EXTRACTION W/ INTRAOCULAR LENS IMPLANT Right 05/2013   CEREBRAL ANGIOGRAM Left 02/14/2007    w/ICA stent/notes 11/04/2010   COLONOSCOPY       CORONARY ANGIOPLASTY WITH STENT PLACEMENT   08/2006    "2 stents"   CORONARY ARTERY BYPASS GRAFT   10/2006    "CABG X4"   EYE SURGERY   05/2013    cataract removed   INSERT /  REPLACE / REMOVE PACEMAKER   03/2012    [place], [place]; [place];[employer]   KNEE ARTHROSCOPY Bilateral X 7    "4 on my left; 3 on my right" (07/29/2017)   LAPAROSCOPIC CHOLECYSTECTOMY       TUBAL LIGATION                Current Outpatient Medications  Medication Sig Dispense Refill   albuterol (PROVENTIL HFA;VENTOLIN HFA) 108 (90 Base) MCG/ACT inhaler Inhale 1-2 puffs into the lungs every 6 (six) hours as needed for wheezing or shortness of breath. 1 Inhaler 5   aspirin 81 MG tablet Take 81 mg by mouth daily.       atorvastatin (LIPITOR) 40 MG tablet TAKE ONE (1) TABLET BY MOUTH EACH DAY 90 tablet 3   clonazePAM (KLONOPIN) 1 MG tablet TAKE 1/2 TABLET BY MOUTH AT 4PM AND 1/2 TABLET AT BEDTIME 90 tablet 0   Ferrous Sulfate (IRON) 325 (65 Fe) MG TABS Take 1 tablet by mouth daily.       folic acid (FOLVITE) 1 MG tablet Take 1 mg by mouth daily.       furosemide (LASIX) 20 MG tablet Take 20 mg by mouth 2 (two) times a day.       glimepiride (AMARYL) 2 MG tablet Take 2 mg by mouth daily with breakfast.       isosorbide mononitrate (IMDUR) 60  MG 24 hr tablet TAKE ONE (1) TABLET BY MOUTH EACH DAY 90 tablet 3   metoprolol tartrate (LOPRESSOR) 25 MG tablet Take 25 mg by mouth 2 (two) times daily.       nitroGLYCERIN (NITROSTAT) 0.4 MG SL tablet Place 0.4 mg under the tongue every 5 (five) minutes as needed for chest pain.       Oxcarbazepine (TRILEPTAL) 300 MG tablet Take 1 tablet twice daily 180 tablet 3   pantoprazole (PROTONIX) 40 MG tablet Take 40 mg by mouth daily.       pramipexole (MIRAPEX) 1 MG tablet Take 1 tablet every night 90 tablet 3   pregabalin (LYRICA) 50 MG capsule Take 1 capsule at bedtime 30 capsule 5   sertraline (ZOLOFT) 100 MG tablet TAKE 1 TABLET BY MOUTH AT BEDTIME 30 tablet 0   traZODone (DESYREL) 100 MG tablet Take 100 mg by mouth 2 (two) times daily.       VITAMIN D PO Take by mouth.        No current facility-administered medications for this visit.      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 @ [employer]  Tobacco Use   Smoking status: Former      Packs/day: 0.75      Years: 40.00      Pack years: 30.00      Types: Cigarettes      Start date: 09/28/1965      Quit date: 05/27/2006      Years since quitting: 15.0   Smokeless tobacco: Never  Vaping Use   Vaping Use: Never used  Substance and Sexual Activity   Alcohol use: Yes  Alcohol/week: 1.0 standard drink      Types: 1 Glasses of wine per week   Drug use: No   Sexual activity: Not Currently  Other Topics Concern   Not on file  Social History Narrative    Pt is right handed     Lives alone in single story home within retirement community    Has 2 adult sons    Some college education    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     Social Determinants of Health    Financial Resource Strain: Not on file  Food Insecurity: Not on file  Transportation Needs: Not on file  Physical Activity: Not on file  Stress: Not on file  Social Connections: Not on file  Intimate Partner Violence: Not on file      Family History:      Family History  Problem Relation Age of Onset   Stomach cancer Mother     Diabetes Mother     Hyperlipidemia Mother     Hypertension Father     Lung cancer Father     Alcohol abuse Father     Early death Brother          Review of Systems: All other systems reviewed and are otherwise negative except as noted above.   Physical Exam:    Vitals:    06/18/21 1046  BP: 122/64  Pulse: 70  Weight: 204 lb (92.5 kg)  Height: 5\' 1"  (1.549 m)      GEN- [name] well appearing, alert and oriented x 3 today.   HEENT: normocephalic, atraumatic; sclera clear, conjunctiva pink; hearing intact; oropharynx clear; neck supple  Lungs- Clear to ausculation bilaterally, normal work of breathing.  No wheezes, rales, rhonchi Heart- Regular rate and rhythm, no murmurs, rubs or gallops  GI- soft, non-tender, non-distended, bowel sounds present  Extremities- no clubbing or cyanosis. No edema MS- no significant deformity or atrophy Skin- warm and dry, no rash or lesion; PPM pocket well healed Psych- euthymic mood, full affect Neuro- strength and sensation are intact   PPM Interrogation- reviewed in detail today,  See PACEART report   EKG:  EKG is ordered today. Personal review of ekg ordered today shows NSR at 70 bpm (Likely A paced)   Recent Labs: No results found for requested labs within last 8760 hours.       Wt Readings from Last 3 Encounters:  06/18/21 204 lb (92.5 kg)  12/12/20 228 lb (103.4 kg)  11/11/20 217 lb (98.4 kg)      Other studies Reviewed: Additional studies/ records that were reviewed today include: Previous EP office notes, Previous remote checks, Most recent labwork.     Assessment and Plan:   1. SND s/p [employer] PPM  Normal PPM function for device at ERI as of 04/08/21 (Confirmed with [facility] that we should go by original date despite subsequent checks pushing her ERI date up to 04/27/21) See Pace Art report No changes today   2. CAD s/p PCI/CABG Denies anginal symptoms   3. Morbid Obesity Body mass index is 38.55 kg/m.  Encouraged lifestyle modification as she continues to recover from hip   Current medicines are reviewed at length with the patient today.     Labs/ tests ordered today include:     Orders Placed This Encounter  Procedures   Basic metabolic panel   CBC   CUP PACEART INCLINIC DEVICE CHECK   EKG 12-Lead  Disposition:   Follow up with Dr. Lovena Le for gen change and usual post care.   Jacalyn Lefevre, PA-C  06/18/2021 11:17 AM  EP Attending  Patient seen and examined. Agree with the findings as noted above. [name] stable but has reached ERI on her [place] [employer] DDD PM and I have discussed the indications/risks/benefits/goals/expectations of DDD PM gen change out and she wishes to proceed.   Carleene Overlie Evalette Montrose,MD

## 2021-06-29 NOTE — Interval H&P Note (Signed)
History and Physical Interval Note:  06/29/2021 10:55 AM  [name]  has presented today for surgery, with the diagnosis of ERI.  The various methods of treatment have been discussed with the patient and family. After consideration of risks, benefits and other options for treatment, the patient has consented to  Procedure(s): PPM GENERATOR CHANGEOUT (N/A) as a surgical intervention.  The patient's history has been reviewed, patient examined, no change in status, stable for surgery.  I have reviewed the patient's chart and labs.  Questions were answered to the patient's satisfaction.     [name]

## 2021-07-08 ENCOUNTER — Telehealth: Payer: Self-pay | Admitting: Internal Medicine

## 2021-07-08 NOTE — Telephone Encounter (Signed)
Called to say that they need for the patient to sign the "rider weaiver and release of liability" form when she comes on Friday. Ask that the form be fax back to them. Please advise

## 2021-07-08 NOTE — Telephone Encounter (Signed)
Pt's ride fell through- she's needing to r/s her wound check tomorrow 10/13   Please call [phone]

## 2021-07-09 ENCOUNTER — Ambulatory Visit: Payer: Medicare Other

## 2021-07-09 NOTE — Telephone Encounter (Signed)
Spoke with transportation services, obtained form that needs to be signed, will give to Oda Kilts, [place] who is seeing pt on 07/10/21.  Was assured that patient will have ride to this appt, form is needed for future transportation.

## 2021-07-10 ENCOUNTER — Other Ambulatory Visit: Payer: Self-pay

## 2021-07-10 ENCOUNTER — Ambulatory Visit: Payer: Medicare Other | Admitting: Student

## 2021-07-10 DIAGNOSIS — I495 Sick sinus syndrome: Secondary | ICD-10-CM

## 2021-07-10 LAB — CUP PACEART INCLINIC DEVICE CHECK
Date Time Interrogation Session: 20221014100453
Implantable Lead Implant Date: 20130611
Implantable Lead Implant Date: 20130611
Implantable Lead Location: 753859
Implantable Lead Location: 753860
Implantable Lead Model: 4469
Implantable Lead Model: 5076
Implantable Lead Serial Number: 566580
Implantable Pulse Generator Implant Date: 20221003
Lead Channel Impedance Value: 388 Ohm
Lead Channel Impedance Value: 497 Ohm
Lead Channel Pacing Threshold Amplitude: 0.3 V
Lead Channel Pacing Threshold Amplitude: 1.1 V
Lead Channel Pacing Threshold Pulse Width: 0.4 ms
Lead Channel Pacing Threshold Pulse Width: 0.4 ms
Lead Channel Sensing Intrinsic Amplitude: 2.3 mV
Lead Channel Sensing Intrinsic Amplitude: 3.8 mV
Lead Channel Setting Pacing Amplitude: 1.9 V
Lead Channel Setting Pacing Amplitude: 2 V
Lead Channel Setting Pacing Pulse Width: 0.4 ms
Lead Channel Setting Sensing Sensitivity: 2 mV
Pulse Gen Serial Number: 541763

## 2021-07-10 NOTE — Progress Notes (Signed)
Wound check appointment. Steri-strips removed. Wound without redness or edema. Incision edges approximated, wound well healed. Normal device function. Thresholds, sensing, and impedances consistent with implant and chronic measurements. Leads with auto-amplitude and stable thresholds. Histogram distribution appropriate for patient and level of activity. No mode switches or high ventricular rates noted. Patient educated about wound care, arm mobility, lifting restrictions. ROV in 3 months with Dr. Lovena Le

## 2021-08-07 DIAGNOSIS — E1169 Type 2 diabetes mellitus with other specified complication: Secondary | ICD-10-CM | POA: Diagnosis not present

## 2021-08-07 DIAGNOSIS — I10 Essential (primary) hypertension: Secondary | ICD-10-CM | POA: Diagnosis not present

## 2021-08-07 DIAGNOSIS — Z1321 Encounter for screening for nutritional disorder: Secondary | ICD-10-CM | POA: Diagnosis not present

## 2021-08-07 DIAGNOSIS — Z1159 Encounter for screening for other viral diseases: Secondary | ICD-10-CM | POA: Diagnosis not present

## 2021-08-07 DIAGNOSIS — I2510 Atherosclerotic heart disease of native coronary artery without angina pectoris: Secondary | ICD-10-CM | POA: Diagnosis not present

## 2021-08-07 DIAGNOSIS — E039 Hypothyroidism, unspecified: Secondary | ICD-10-CM | POA: Diagnosis not present

## 2021-08-11 DIAGNOSIS — E119 Type 2 diabetes mellitus without complications: Secondary | ICD-10-CM | POA: Diagnosis not present

## 2021-08-11 DIAGNOSIS — H40033 Anatomical narrow angle, bilateral: Secondary | ICD-10-CM | POA: Diagnosis not present

## 2021-09-16 DIAGNOSIS — M79606 Pain in leg, unspecified: Secondary | ICD-10-CM | POA: Diagnosis not present

## 2021-09-23 ENCOUNTER — Encounter (HOSPITAL_COMMUNITY): Payer: Self-pay | Admitting: *Deleted

## 2021-09-23 ENCOUNTER — Other Ambulatory Visit: Payer: Self-pay

## 2021-09-23 ENCOUNTER — Emergency Department (HOSPITAL_COMMUNITY)
Admission: EM | Admit: 2021-09-23 | Discharge: 2021-09-23 | Disposition: A | Discharge status: Home / Self Care | Payer: Medicare Other | Attending: Emergency Medicine | Admitting: Emergency Medicine

## 2021-09-23 DIAGNOSIS — R252 Cramp and spasm: Secondary | ICD-10-CM | POA: Insufficient documentation

## 2021-09-23 DIAGNOSIS — Z79899 Other long term (current) drug therapy: Secondary | ICD-10-CM | POA: Insufficient documentation

## 2021-09-23 DIAGNOSIS — E039 Hypothyroidism, unspecified: Secondary | ICD-10-CM | POA: Insufficient documentation

## 2021-09-23 DIAGNOSIS — M79604 Pain in right leg: Secondary | ICD-10-CM | POA: Insufficient documentation

## 2021-09-23 DIAGNOSIS — I5032 Chronic diastolic (congestive) heart failure: Secondary | ICD-10-CM | POA: Diagnosis not present

## 2021-09-23 DIAGNOSIS — Z8541 Personal history of malignant neoplasm of cervix uteri: Secondary | ICD-10-CM | POA: Diagnosis not present

## 2021-09-23 DIAGNOSIS — Z950 Presence of cardiac pacemaker: Secondary | ICD-10-CM | POA: Insufficient documentation

## 2021-09-23 DIAGNOSIS — I2510 Atherosclerotic heart disease of native coronary artery without angina pectoris: Secondary | ICD-10-CM | POA: Insufficient documentation

## 2021-09-23 DIAGNOSIS — Z87891 Personal history of nicotine dependence: Secondary | ICD-10-CM | POA: Insufficient documentation

## 2021-09-23 DIAGNOSIS — Z7982 Long term (current) use of aspirin: Secondary | ICD-10-CM | POA: Diagnosis not present

## 2021-09-23 DIAGNOSIS — N183 Chronic kidney disease, stage 3 unspecified: Secondary | ICD-10-CM | POA: Insufficient documentation

## 2021-09-23 DIAGNOSIS — J449 Chronic obstructive pulmonary disease, unspecified: Secondary | ICD-10-CM | POA: Insufficient documentation

## 2021-09-23 DIAGNOSIS — I130 Hypertensive heart and chronic kidney disease with heart failure and stage 1 through stage 4 chronic kidney disease, or unspecified chronic kidney disease: Secondary | ICD-10-CM | POA: Diagnosis not present

## 2021-09-23 DIAGNOSIS — Z955 Presence of coronary angioplasty implant and graft: Secondary | ICD-10-CM | POA: Insufficient documentation

## 2021-09-23 DIAGNOSIS — M79605 Pain in left leg: Secondary | ICD-10-CM | POA: Insufficient documentation

## 2021-09-23 DIAGNOSIS — R001 Bradycardia, unspecified: Secondary | ICD-10-CM | POA: Diagnosis not present

## 2021-09-23 LAB — CBC WITH DIFFERENTIAL/PLATELET
Abs Immature Granulocytes: 0.02 10*3/uL (ref 0.00–0.07)
Basophils Absolute: 0 10*3/uL (ref 0.0–0.1)
Basophils Relative: 0 %
Eosinophils Absolute: 0.1 10*3/uL (ref 0.0–0.5)
Eosinophils Relative: 1 %
HCT: 34.8 % — ABNORMAL LOW (ref 36.0–46.0)
Hemoglobin: 11 g/dL — ABNORMAL LOW (ref 12.0–15.0)
Immature Granulocytes: 0 %
Lymphocytes Relative: 17 %
Lymphs Abs: 1.6 10*3/uL (ref 0.7–4.0)
MCH: 26.3 pg (ref 26.0–34.0)
MCHC: 31.6 g/dL (ref 30.0–36.0)
MCV: 83.1 fL (ref 80.0–100.0)
Monocytes Absolute: 1.3 10*3/uL — ABNORMAL HIGH (ref 0.1–1.0)
Monocytes Relative: 15 %
Neutro Abs: 5.9 10*3/uL (ref 1.7–7.7)
Neutrophils Relative %: 67 %
Platelets: 193 10*3/uL (ref 150–400)
RBC: 4.19 MIL/uL (ref 3.87–5.11)
RDW: 17.2 % — ABNORMAL HIGH (ref 11.5–15.5)
WBC: 9 10*3/uL (ref 4.0–10.5)
nRBC: 0 % (ref 0.0–0.2)

## 2021-09-23 LAB — COMPREHENSIVE METABOLIC PANEL
ALT: 34 U/L (ref 0–44)
AST: 26 U/L (ref 15–41)
Albumin: 4.3 g/dL (ref 3.5–5.0)
Alkaline Phosphatase: 156 U/L — ABNORMAL HIGH (ref 38–126)
Anion gap: 8 (ref 5–15)
BUN: 19 mg/dL (ref 8–23)
CO2: 26 mmol/L (ref 22–32)
Calcium: 9.1 mg/dL (ref 8.9–10.3)
Chloride: 103 mmol/L (ref 98–111)
Creatinine, Ser: 1.07 mg/dL — ABNORMAL HIGH (ref 0.44–1.00)
GFR, Estimated: 54 mL/min — ABNORMAL LOW (ref >60)
Glucose, Bld: 103 mg/dL — ABNORMAL HIGH (ref 70–99)
Potassium: 4.2 mmol/L (ref 3.5–5.1)
Sodium: 137 mmol/L (ref 135–145)
Total Bilirubin: 0.8 mg/dL (ref 0.3–1.2)
Total Protein: 7.4 g/dL (ref 6.5–8.1)

## 2021-09-23 LAB — CK: Total CK: 80 U/L (ref 38–234)

## 2021-09-23 LAB — MAGNESIUM: Magnesium: 2.2 mg/dL (ref 1.7–2.4)

## 2021-09-23 MED ORDER — BACLOFEN 5 MG PO TABS: 5.0000 mg | ORAL_TABLET | Freq: Three times a day (TID) | ORAL | 0 refills | Status: AC

## 2021-09-23 MED ORDER — BACLOFEN 10 MG PO TABS: 5.0000 mg | ORAL_TABLET | Freq: Three times a day (TID) | ORAL | 0 refills | Status: DC

## 2021-09-23 MED ORDER — DIAZEPAM 5 MG/ML IJ SOLN
2.0000 mg | Freq: Once | INTRAMUSCULAR | Status: AC
  Administered 2021-09-23: 12:00:00 2 mg via INTRAVENOUS
  Filled 2021-09-23: qty 2

## 2021-09-23 NOTE — ED Notes (Signed)
Pt less restless. States valium helped some

## 2021-09-23 NOTE — ED Triage Notes (Signed)
Pt states she was suppose to come here Thursday night but didn't.  Pt sent here by PCP for bilateral leg pain and possible electrolyte disturbance per note sent by Dr. Stana Bunting at [place].

## 2021-09-23 NOTE — ED Notes (Signed)
Pt calling ride and getting dressed.

## 2021-09-23 NOTE — ED Notes (Signed)
See triage ntoes. Ble swelling noted. Tender to touch. Pt legs are restless as well. Pt sob with activity. Pt states she has COPD and sob is not any worse than her usual. Ambulating with walker.

## 2021-09-23 NOTE — Discharge Instructions (Addendum)
Your exam and history suggest that you are having muscle spasms in your upper legs, however your electrolytes are normal including your potassium, magnesium and calcium levels.  Since you have responded favorably in the past by using the medication baclofen you have been prescribed this medication again.  Take medication as prescribed.  Plan follow-up with your primary provider if your symptoms persist or not improving.  As discussed you may also benefit from seeing a back specialist or an orthopedist if your symptoms persist.  I am referring you our local orthopedic Dr. Aline Brochure.  Call his office for an appointment if your symptoms are not improving.

## 2021-09-23 NOTE — ED Provider Notes (Signed)
[facility] EMERGENCY DEPARTMENT Provider Note   CSN: [id number] Arrival date & time: 09/23/21  1049     History Chief Complaint  Patient presents with   Leg Pain    [name] is a 75 y.o. female presenting from a local nursing home, history of arthritis, chronic kidney disease, COPD CAD with history of MI, also has a history of hypokalemia and is currently taking supplementation presenting for evaluation of persistent and worsening pain and muscle spasms which have been nearly constant and localized to her bilateral upper thighs.  Her symptoms started 1 week ago.  She was seen by her PCP who recommended ED evaluation as there is concern for electrolyte abnormalities, however she delayed her care until today, stating she cannot stand it anymore.  Her pain is keeping her awake at night.  She has tried various OTC remedies including muscle rubs, sprays, and OTC "muscle spasm pill" without improvement in her symptoms.  She has no involvement of her upper extremities or her calves.  She denies chest pain, palpitations, has no shortness of breath beyond her baseline.  She does endorse significant fatigue secondary to lack of sleep due to the her symptoms.  The history is provided by the patient.      Past Medical History:  Diagnosis Date   Anxiety    Arthritis    "all over" (07/29/2017)   Bulging disc    "still have one in my neck" (07/29/2017)   Carotid arterial disease ([place])    a. s/p L intracranial ICA stenting (Deveshwar).   Cataract    "left eye; didn't take it out cause of macular degeneration" (07/29/2017)   Cervical cancer ([place])    hx   Chronic kidney disease, stage III (moderate) ([place])    "only have 1 working kidney" (07/29/2017)   COPD (chronic obstructive pulmonary disease) ([place])    a. quit smoking in 2007.   Coronary artery disease    a. 2007 s/p MI and LAD stenting;  10/2006 CABG x 4: LIMA->LAD, VG->OM->dLCX, VG->RPL;  c. 07/2013 MV: no ischemia.   Depression     Esophageal reflux    Hepatitis 1968   "don't remember which one"   History of blood transfusion 10/29/2006   "related to bypass OR"   History of echocardiogram    a. 01/2012 Echo: EF 55%, mildly dil LA/RV, mild MR, Ao Sclerosis, mod TR.   Hypothyroid    Insomnia    Macular degeneration    "left eye" (07/29/2017)   Mixed hyperlipidemia    NSTEMI (non-ST elevated myocardial infarction) ([place]) 08/2006; 10/2006   Osteoporosis    Pneumonia    "several times before I quit smoking" (07/29/2017)   Presence of permanent cardiac pacemaker    PVT (paroxysmal ventricular tachycardia)    Restless leg syndrome    Stroke [facility]) ?2008   denies residual on 07/29/2017   Symptomatic bradycardia    a. s/p BSX [employer] dual chamber PPM Beckie Salts).   Syncope and collapse    Unspecified hearing loss     Patient Active Problem List   Diagnosis Date Noted   Morbid obesity ([place]) 01/31/2018   Syncope 07/28/2017   Chronic kidney disease, stage III (moderate) ([place]) 07/28/2017   COPD (chronic obstructive pulmonary disease) ([place]) 07/28/2017   Pulmonary hypertension ([place]) 07/28/2017   Chronic diastolic heart failure, NYHA class 1 ([place]) 07/28/2017   Coronary artery disease 07/28/2017   Mixed hyperlipidemia 07/28/2017   Hypothyroid 07/28/2017   History of PVT (paroxysmal  ventricular tachycardia) ([place]) 07/28/2017   Symptomatic bradycardia 07/28/2017   Restless leg syndrome 07/28/2017   Acute lower UTI 04/15/2017   Syncope and collapse 04/15/2017   CKD (chronic kidney disease) stage 3, GFR 30-59 ml/min (HCC) 04/15/2017   CAD (coronary artery disease) S/P CABG 04/14/2017   Sinus node dysfunction (HCC) 08/21/2013   Atrial tachycardia ([place]) 08/21/2013   Pacemaker -[employer] 08/21/2013   Coronary atherosclerosis 11/21/2009   GASTRIC ULCER 01/21/2009   DIARRHEA, CHRONIC 12/31/2008   IRON DEFICIENCY 11/28/2008   LEG EDEMA 11/19/2008   Chronic respiratory failure with hypoxia (HCC) 11/19/2008   Angina  pectoris ([place]) 11/19/2008   HEADACHE 08/19/2008   MACULAR DEGENERATION 02/20/2008   DISORDER, TOBACCO USE 02/09/2008   CAROTID ARTERY DISEASE 02/13/2007   TRANSIENT ISCHEMIC ATTACK 02/13/2007   INSOMNIA 02/13/2007   Restless legs syndrome (RLS) 01/27/2007   OSTEOPENIA 01/13/2007   ALKALINE PHOSPHATASE, ELEVATED 11/11/2006   HEMORRHAGE, CONJUNCTIVAL 11/04/2006   MALAISE AND FATIGUE 11/04/2006   CERVICAL CANCER 10/11/2006   Hyperlipidemia 10/11/2006   Anxiety 10/11/2006   Depression 10/11/2006   Essential hypertension 10/11/2006   MYOCARDIAL INFARCTION, HX OF 10/11/2006   GERD 10/11/2006   OSTEOARTHRITIS 10/11/2006   LOW BACK PAIN 10/11/2006    Past Surgical History:  Procedure Laterality Date   ABDOMINAL HYSTERECTOMY  1970s   "partial"   ANTERIOR CERVICAL DECOMP/DISCECTOMY FUSION  03/2001; 04/2005;?  date for 3rd OR   "used bone from my hips each time"   [place]  2008   CATARACT EXTRACTION W/ INTRAOCULAR LENS IMPLANT Right 05/2013   CEREBRAL ANGIOGRAM Left 02/14/2007   w/ICA stent/notes 11/04/2010   COLONOSCOPY     CORONARY ANGIOPLASTY WITH STENT PLACEMENT  08/2006   "2 stents"   CORONARY ARTERY BYPASS GRAFT  10/2006   "CABG X4"   EYE SURGERY  05/2013   cataract removed   INSERT / REPLACE / REMOVE PACEMAKER  03/2012   [place], [place]; [place];[employer]   KNEE ARTHROSCOPY Bilateral X 7   "4 on my left; 3 on my right" (07/29/2017)   LAPAROSCOPIC CHOLECYSTECTOMY     PPM GENERATOR CHANGEOUT N/A 06/29/2021   Procedure: PPM GENERATOR CHANGEOUT;  Surgeon: Evans Lance, MD;  Location: [place];  Service: Cardiovascular;  Laterality: N/A;   TUBAL LIGATION       OB History   No obstetric history on file.     Family History  Problem Relation Age of Onset   Stomach cancer Mother    Diabetes Mother    Hyperlipidemia Mother    Hypertension Father    Lung cancer Father    Alcohol abuse Father    Early death Brother      Social History   Tobacco Use   Smoking status: Former    Packs/day: 0.75    Years: 40.00    Pack years: 30.00    Types: Cigarettes    Start date: 09/28/1965    Quit date: 05/27/2006    Years since quitting: 15.3   Smokeless tobacco: Never  Vaping Use   Vaping Use: Never used  Substance Use Topics   Alcohol use: Yes    Alcohol/week: 1.0 standard drink    Types: 1 Glasses of wine per week   Drug use: No    Home Medications Prior to Admission medications   Medication Sig Start Date End Date Taking? Authorizing Provider  acetaminophen (TYLENOL) 325 MG tablet Take 650 mg by mouth every  6 (six) hours as needed for moderate pain.   Yes [provider]  albuterol (PROVENTIL HFA;VENTOLIN HFA) 108 (90 Base) MCG/ACT inhaler Inhale 1-2 puffs into the lungs every 6 (six) hours as needed for wheezing or shortness of breath. 04/19/17  Yes Rigoberto Noel, MD  aspirin 81 MG tablet Take 81 mg by mouth daily.   Yes [provider]  furosemide (LASIX) 20 MG tablet Take 20 mg by mouth 2 (two) times a day.   Yes [provider]  isosorbide mononitrate (IMDUR) 60 MG 24 hr tablet TAKE ONE (1) TABLET BY MOUTH EACH DAY Patient taking differently: Take 60 mg by mouth daily. 12/09/20  Yes Jerline Pain, MD  levothyroxine (SYNTHROID) 50 MCG tablet Take 50 mcg by mouth daily before breakfast. 02/09/21  Yes [provider]  metoprolol tartrate (LOPRESSOR) 25 MG tablet Take 25 mg by mouth 2 (two) times daily.   Yes [provider]  nitroGLYCERIN (NITROSTAT) 0.4 MG SL tablet Place 0.4 mg under the tongue every 5 (five) minutes as needed for chest pain.   Yes [provider]  Oxcarbazepine (TRILEPTAL) 300 MG tablet Take 1 tablet twice daily Patient taking differently: Take 300 mg by mouth 2 (two) times daily. 02/14/20  Yes Cameron Sprang, MD  pantoprazole (PROTONIX) 40 MG tablet Take 40 mg by mouth at bedtime.   Yes [provider]  potassium chloride  (KLOR-CON) 10 MEQ tablet Take 10 mEq by mouth daily.   Yes [provider]  pramipexole (MIRAPEX) 1.5 MG tablet Take 1.5 mg by mouth at bedtime.   Yes [provider]  sertraline (ZOLOFT) 100 MG tablet TAKE 1 TABLET BY MOUTH AT BEDTIME Patient taking differently: Take 100 mg by mouth daily. 04/06/21  Yes Cameron Sprang, MD  traZODone (DESYREL) 50 MG tablet Take 25 mg by mouth 2 (two) times daily.   Yes [provider]  TRELEGY ELLIPTA 100-62.5-25 MCG/ACT AEPB Inhale 1 puff into the lungs daily. 08/16/21  Yes [provider]  atorvastatin (LIPITOR) 40 MG tablet TAKE ONE (1) TABLET BY MOUTH EACH DAY Patient not taking: Reported on 09/23/2021 12/09/20   Jerline Pain, MD  baclofen 5 MG TABS Take 5 mg by mouth 3 (three) times daily for 10 days. 09/23/21 10/03/21  Evalee Jefferson, PA-C    Allergies    Iohexol and Codeine  Review of Systems   Review of Systems  Constitutional:  Negative for chills and fever.  HENT: Negative.    Respiratory:  Positive for shortness of breath.        Baseline sob  Cardiovascular: Negative.   Gastrointestinal: Negative.   Musculoskeletal:  Positive for myalgias. Negative for joint swelling.  Neurological:  Negative for weakness and numbness.  All other systems reviewed and are negative.  Physical Exam Updated Vital Signs BP (!) 127/58    Pulse 83    Temp 98 F (36.7 C) (Oral)    Resp 18    Ht 5\' 1"  (1.549 m)    Wt 89.4 kg    SpO2 96%    BMI 37.22 kg/m   Physical Exam Constitutional:      Appearance: She is well-developed.  HENT:     Head: Atraumatic.  Cardiovascular:     Pulses:          Dorsalis pedis pulses are 2+ on the right side and 2+ on the left side.     Comments: Pulses equal bilaterally Musculoskeletal:  General: Tenderness present. No deformity.     Cervical back: Normal range of motion.     Left lower leg: No edema.     Comments: Tender to palpation bilateral anterior and posterior thighs.  Her skin  is soft, no induration or erythema.  Skin:    General: Skin is warm and dry.     Findings: No erythema or rash.     Comments: Spider angioma noted scattered on lower extremities.  No palpable cords.  Neurological:     Mental Status: She is alert.     Sensory: No sensory deficit.     Motor: No weakness.     Deep Tendon Reflexes: Reflexes normal.    ED Results / Procedures / Treatments   Labs (all labs ordered are listed, but only abnormal results are displayed) Labs Reviewed  CBC WITH DIFFERENTIAL/PLATELET - Abnormal; Notable for the following components:      Result Value   Hemoglobin 11.0 (*)    HCT 34.8 (*)    RDW 17.2 (*)    Monocytes Absolute 1.3 (*)    All other components within normal limits  COMPREHENSIVE METABOLIC PANEL - Abnormal; Notable for the following components:   Glucose, Bld 103 (*)    Creatinine, Ser 1.07 (*)    Alkaline Phosphatase 156 (*)    GFR, Estimated 54 (*)    All other components within normal limits  MAGNESIUM  CK    EKG EKG Interpretation  Date/Time:  Wednesday September 23 2021 11:41:43 EST Ventricular Rate:  73 PR Interval:  162 QRS Duration: 113 QT Interval:  422 QTC Calculation: 465 R Axis:   119 Text Interpretation: Sinus rhythm Paired ventricular premature complexes IRBBB and LPFB Low voltage, precordial leads Since last tracing Premature ventricular complexes NOW PRESENT Conduction delays are chronic Confirmed by Calvert Cantor [phone]) on 09/23/2021 12:55:34 PM  Radiology No results found.  Procedures Procedures   Medications Ordered in ED Medications  diazepam (VALIUM) injection 2 mg (2 mg Intravenous Given 09/23/21 1147)    ED Course  I have reviewed the triage vital signs and the nursing notes.  Pertinent labs & imaging results that were available during my care of the patient were reviewed by me and considered in my medical decision making (see chart for details).    MDM Rules/Calculators/A&P                          Patient with persistent cramping pain in her bilateral upper thighs of unclear etiology.  She does have a history of restless leg syndrome, however she states her restless leg syndrome does not usually cause pain like her current presentation does.  She does state that she has had baclofen in the past which has been helpful at relieving prior episodes of similar symptoms, however she was taken off this medication along with a number of other medications about 4 months ago by her new PCP who in consultation with patient's son felt she was overmedicated.  Review of her labs today are reassuring she has no electrolyte abnormalities, specifically her potassium, magnesium and her calcium levels are reassuring.  Her symptoms are bilateral and do not involve her calves.  She has no peripheral edema, no exam findings to suggest DVT.  She was given a prescription for baclofen, caution regarding sedation.  She was advised follow-up with her PCP.  Prior to discharge she also indicated chronic low back pain which is not new or worsened,  she also denies weakness in her legs or symptoms suggesting cauda equina.  I doubt the spasms in her thighs are radicular symptoms from her lumbar spine but further evaluation by orthopedics may be reasonable.  She was given a referral to orthopedics for further evaluation if baclofen alone does not improve her symptoms.    Final Clinical Impression(s) / ED Diagnoses Final diagnoses:  Bilateral leg pain    Rx / DC Orders ED Discharge Orders          Ordered    baclofen (LIORESAL) 10 MG tablet  3 times daily,   Status:  Discontinued        09/23/21 1334    baclofen 5 MG TABS  3 times daily        09/23/21 1335             Landis Martins 09/23/21 2021    Truddie Hidden, MD 09/24/21 [phone]

## 2021-10-01 ENCOUNTER — Ambulatory Visit (INDEPENDENT_AMBULATORY_CARE_PROVIDER_SITE_OTHER): Payer: Medicare Other

## 2021-10-01 DIAGNOSIS — I495 Sick sinus syndrome: Secondary | ICD-10-CM

## 2021-10-01 LAB — CUP PACEART REMOTE DEVICE CHECK
Battery Remaining Longevity: 162 mo
Battery Remaining Percentage: 100 %
Brady Statistic RA Percent Paced: 88 %
Brady Statistic RV Percent Paced: 10 %
Date Time Interrogation Session: 20230105034100
Implantable Lead Implant Date: 20130611
Implantable Lead Implant Date: 20130611
Implantable Lead Location: 753859
Implantable Lead Location: 753860
Implantable Lead Model: 4469
Implantable Lead Model: 5076
Implantable Lead Serial Number: 566580
Implantable Pulse Generator Implant Date: 20221003
Lead Channel Impedance Value: 386 Ohm
Lead Channel Impedance Value: 484 Ohm
Lead Channel Pacing Threshold Amplitude: 0.4 V
Lead Channel Pacing Threshold Amplitude: 1.6 V
Lead Channel Pacing Threshold Pulse Width: 0.4 ms
Lead Channel Pacing Threshold Pulse Width: 0.4 ms
Lead Channel Setting Pacing Amplitude: 2 V
Lead Channel Setting Pacing Amplitude: 3.5 V
Lead Channel Setting Pacing Pulse Width: 0.4 ms
Lead Channel Setting Sensing Sensitivity: 2 mV
Pulse Gen Serial Number: 541763

## 2021-10-02 ENCOUNTER — Encounter: Payer: Medicare Other | Admitting: Internal Medicine

## 2021-10-02 DIAGNOSIS — M79605 Pain in left leg: Secondary | ICD-10-CM | POA: Diagnosis not present

## 2021-10-02 DIAGNOSIS — M79606 Pain in leg, unspecified: Secondary | ICD-10-CM | POA: Diagnosis not present

## 2021-10-02 DIAGNOSIS — G4700 Insomnia, unspecified: Secondary | ICD-10-CM | POA: Diagnosis not present

## 2021-10-02 DIAGNOSIS — G2581 Restless legs syndrome: Secondary | ICD-10-CM | POA: Diagnosis not present

## 2021-10-02 DIAGNOSIS — R600 Localized edema: Secondary | ICD-10-CM | POA: Diagnosis not present

## 2021-10-02 DIAGNOSIS — S0990XA Unspecified injury of head, initial encounter: Secondary | ICD-10-CM | POA: Diagnosis not present

## 2021-10-02 DIAGNOSIS — M79604 Pain in right leg: Secondary | ICD-10-CM | POA: Diagnosis not present

## 2021-10-02 DIAGNOSIS — R2243 Localized swelling, mass and lump, lower limb, bilateral: Secondary | ICD-10-CM | POA: Diagnosis not present

## 2021-10-12 NOTE — Progress Notes (Signed)
Remote pacemaker transmission.   

## 2021-10-19 DIAGNOSIS — R609 Edema, unspecified: Secondary | ICD-10-CM | POA: Diagnosis not present

## 2021-10-19 DIAGNOSIS — L0390 Cellulitis, unspecified: Secondary | ICD-10-CM | POA: Diagnosis not present

## 2021-11-11 ENCOUNTER — Other Ambulatory Visit: Payer: Self-pay

## 2021-11-11 ENCOUNTER — Ambulatory Visit (INDEPENDENT_AMBULATORY_CARE_PROVIDER_SITE_OTHER): Payer: Medicare Other | Admitting: Internal Medicine

## 2021-11-11 ENCOUNTER — Encounter: Payer: Self-pay | Admitting: Internal Medicine

## 2021-11-11 VITALS — BP 114/80 | HR 89 | Ht 62.0 in | Wt 201.4 lb

## 2021-11-11 DIAGNOSIS — Z79899 Other long term (current) drug therapy: Secondary | ICD-10-CM

## 2021-11-11 DIAGNOSIS — I495 Sick sinus syndrome: Secondary | ICD-10-CM

## 2021-11-11 LAB — PACEMAKER DEVICE OBSERVATION

## 2021-11-11 MED ORDER — FUROSEMIDE 40 MG PO TABS: 40.0000 mg | ORAL_TABLET | Freq: Two times a day (BID) | ORAL | 3 refills | Status: DC

## 2021-11-11 MED ORDER — APIXABAN 5 MG PO TABS: 5.0000 mg | ORAL_TABLET | Freq: Two times a day (BID) | ORAL | 11 refills | Status: AC

## 2021-11-11 NOTE — Progress Notes (Signed)
HPI [name] returns today for followup. S he is a pleasant 76 yo woman with a h/o chronic diastolic heart failure, Obesity, sinus node dysfunction s/p PPM insertion with recent PPM gen change. She has an elevated atrial threshold. She c/o worsening sob. She has not had any falls and has reverted back to atrial flutter/fib. She is not symptomatic, not having palpitations. Her left leg has been difficult to control the swelling on since her break.  Allergies  Allergen Reactions   Iohexol Anaphylaxis     Desc: PT HAD CONTRAST REACTION YRS AGO NEEDS 13 HR PREP FOR ALL CONTRAST STUDIES    Codeine     swelling     Current Outpatient Medications  Medication Sig Dispense Refill   acetaminophen (TYLENOL) 325 MG tablet Take 650 mg by mouth every 6 (six) hours as needed for moderate pain.     albuterol (PROVENTIL HFA;VENTOLIN HFA) 108 (90 Base) MCG/ACT inhaler Inhale 1-2 puffs into the lungs every 6 (six) hours as needed for wheezing or shortness of breath. 1 Inhaler 5   aspirin 81 MG tablet Take 81 mg by mouth daily.     atorvastatin (LIPITOR) 40 MG tablet TAKE ONE (1) TABLET BY MOUTH EACH DAY 90 tablet 3   furosemide (LASIX) 20 MG tablet Take 20 mg by mouth 2 (two) times a day.     levothyroxine (SYNTHROID) 50 MCG tablet Take 50 mcg by mouth daily before breakfast.     metoprolol tartrate (LOPRESSOR) 25 MG tablet Take 25 mg by mouth 2 (two) times daily.     nitroGLYCERIN (NITROSTAT) 0.4 MG SL tablet Place 0.4 mg under the tongue every 5 (five) minutes as needed for chest pain.     Oxcarbazepine (TRILEPTAL) 300 MG tablet Take 1 tablet twice daily (Patient taking differently: Take 300 mg by mouth 2 (two) times daily.) 180 tablet 3   pantoprazole (PROTONIX) 40 MG tablet Take 40 mg by mouth at bedtime.     potassium chloride (KLOR-CON) 10 MEQ tablet Take 10 mEq by mouth daily.     pramipexole (MIRAPEX) 1.5 MG tablet Take 1.5 mg by mouth at bedtime.     sertraline (ZOLOFT) 100 MG tablet  TAKE 1 TABLET BY MOUTH AT BEDTIME (Patient taking differently: Take 100 mg by mouth daily.) 30 tablet 0   traZODone (DESYREL) 50 MG tablet Take 25 mg by mouth 2 (two) times daily.     TRELEGY ELLIPTA 100-62.5-25 MCG/ACT AEPB Inhale 1 puff into the lungs daily.     isosorbide mononitrate (IMDUR) 60 MG 24 hr tablet TAKE ONE (1) TABLET BY MOUTH EACH DAY (Patient not taking: Reported on 11/11/2021) 90 tablet 3   No current facility-administered medications for this visit.     Past Medical History:  Diagnosis Date   Anxiety    Arthritis    "all over" (07/29/2017)   Bulging disc    "still have one in my neck" (07/29/2017)   Carotid arterial disease ([place])    a. s/p L intracranial ICA stenting (Deveshwar).   Cataract    "left eye; didn't take it out cause of macular degeneration" (07/29/2017)   Cervical cancer ([place])    hx   Chronic kidney disease, stage III (moderate) ([place])    "only have 1 working kidney" (07/29/2017)   COPD (chronic obstructive pulmonary disease) ([place])    a. quit smoking in 2007.   Coronary artery disease    a. 2007 s/p MI and LAD stenting;  10/2006  CABG x 4: LIMA->LAD, VG->OM->dLCX, VG->RPL;  c. 07/2013 MV: no ischemia.   Depression    Esophageal reflux    Hepatitis 1968   "don't remember which one"   History of blood transfusion 10/29/2006   "related to bypass OR"   History of echocardiogram    a. 01/2012 Echo: EF 55%, mildly dil LA/RV, mild MR, Ao Sclerosis, mod TR.   Hypothyroid    Insomnia    Macular degeneration    "left eye" (07/29/2017)   Mixed hyperlipidemia    NSTEMI (non-ST elevated myocardial infarction) ([place]) 08/2006; 10/2006   Osteoporosis    Pneumonia    "several times before I quit smoking" (07/29/2017)   Presence of permanent cardiac pacemaker    PVT (paroxysmal ventricular tachycardia)    Restless leg syndrome    Stroke [facility]) ?2008   denies residual on 07/29/2017   Symptomatic bradycardia    a. s/p BSX [employer]io dual chamber PPM Beckie Salts).    Syncope and collapse    Unspecified hearing loss     ROS:   All systems reviewed and negative except as noted in the HPI.   Past Surgical History:  Procedure Laterality Date   ABDOMINAL HYSTERECTOMY  1970s   "partial"   ANTERIOR CERVICAL DECOMP/DISCECTOMY FUSION  03/2001; 04/2005;?  date for 3rd OR   "used bone from my hips each time"   [place]  2008   CATARACT EXTRACTION W/ INTRAOCULAR LENS IMPLANT Right 05/2013   CEREBRAL ANGIOGRAM Left 02/14/2007   w/ICA stent/notes 11/04/2010   COLONOSCOPY     CORONARY ANGIOPLASTY WITH STENT PLACEMENT  08/2006   "2 stents"   CORONARY ARTERY BYPASS GRAFT  10/2006   "CABG X4"   EYE SURGERY  05/2013   cataract removed   INSERT / REPLACE / REMOVE PACEMAKER  03/2012   [place], [place]; [place];[employer]   KNEE ARTHROSCOPY Bilateral X 7   "4 on my left; 3 on my right" (07/29/2017)   [place] N/A 06/29/2021   Procedure: PPM GENERATOR CHANGEOUT;  Surgeon: Evans Lance, MD;  Location: [facility];  Service: Cardiovascular;  Laterality: N/A;   TUBAL LIGATION       Family History  Problem Relation Age of Onset   Stomach cancer Mother    Diabetes Mother    Hyperlipidemia Mother    Hypertension Father    Lung cancer Father    Alcohol abuse Father    Early death Brother      Social History   Socioeconomic History   Marital status: Widowed    Spouse name: Not on file   Number of children: 2   Years of education: Not on file   Highest education level: Some college, no degree  Occupational History   Occupation: retired    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 @ [employer]  Tobacco Use   Smoking status: Former    Packs/day: 0.75    Years: 40.00    Pack years: 30.00    Types: Cigarettes    Start date: 09/28/1965    Quit date: 05/27/2006    Years since quitting: 15.4   Smokeless tobacco: Never  Vaping Use   Vaping Use: Never used  Substance and Sexual Activity    Alcohol use: Yes    Alcohol/week: 1.0 standard drink    Types: 1 Glasses of wine per week   Drug use: No   Sexual activity: Not Currently  Other Topics Concern   Not on file  Social History Narrative   Pt is right handed    Lives alone in single story home within retirement community   Has 2 adult sons   Some college education   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   Social Determinants of Health   Financial Resource Strain: Not on file  Food Insecurity: Not on file  Transportation Needs: Not on file  Physical Activity: Not on file  Stress: Not on file  Social Connections: Not on file  Intimate Partner Violence: Not on file     BP 114/80    Pulse 89    Ht 5\' 2"  (1.575 m)    Wt 201 lb 6.4 oz (91.4 kg)    SpO2 97%    BMI 36.84 kg/m   Physical Exam:  Well appearing NAD HEENT: Unremarkable Neck:  No JVD, no thyromegally Lymphatics:  No adenopathy Back:  No CVA tenderness Lungs:  Clear with no wheezes HEART:  Regular rate rhythm, no murmurs, no rubs, no clicks Abd:  soft, positive bowel sounds, no organomegally, no rebound, no guarding Ext:  2 plus pulses, 2+edema, no cyanosis, no clubbing Skin:  No rashes no nodules Neuro:  CN II through XII intact, motor grossly intact  DEVICE  Normal device function.  See PaceArt for details.   Assess/Plan:  Atrial fib/flutter - she is not falling and I asked her to get  back on Eliquis. Chronic diastolic heart failure -I have asked her to take more lasix and we will check her bmp in 2 weeks. Leg swelling -I asked her to reduce her salt intake and keep her leg elevated. I suspect venous insuff. PPM - her [employer] DDD PM is working normally. We reprogrammed to DDIR 60/min.  Carleene Overlie Hudsen Fei,MD

## 2021-11-11 NOTE — Patient Instructions (Addendum)
Medication Instructions:  Increase Lasix to 40 mg Two Times Daily  Start Eliquis 5 mg Two Times Daily  Stop Asprin   *If you need a refill on your cardiac medications before your next appointment, please call your pharmacy*   Lab Work: Your physician recommends that you return for lab work in: 2 Weeks (11/25/21)   If you have labs (blood work) drawn today and your tests are completely normal, you will receive your results only by: [place] (if you have [place]) OR A paper copy in the mail If you have any lab test that is abnormal or we need to change your treatment, we will call you to review the results.   Testing/Procedures: NONE    Follow-Up: At [facility], you and your health needs are our priority.  As part of our continuing mission to provide you with exceptional heart care, we have created designated Provider Care Teams.  These Care Teams include your primary Cardiologist (physician) and Advanced Practice Providers (APPs -  Physician Assistants and Nurse Practitioners) who all work together to provide you with the care you need, when you need it.  We recommend signing up for the patient portal called "MyChart".  Sign up information is provided on this After Visit Summary.  MyChart is used to connect with patients for Virtual Visits (Telemedicine).  Patients are able to view lab/test results, encounter notes, upcoming appointments, etc.  Non-urgent messages can be sent to your provider as well.   To learn more about what you can do with MyChart, go to [email or web address].    Your next appointment:   1 year(s)  The format for your next appointment:   In Person  Provider:   Cristopher Peru, MD    Other Instructions Thank you for choosing [facility]!  You have been given samples of Eliquis Lot # [id number], Exp: 3/25, #28

## 2021-11-25 ENCOUNTER — Other Ambulatory Visit (HOSPITAL_COMMUNITY)
Admission: RE | Admit: 2021-11-25 | Discharge: 2021-11-25 | Disposition: A | Discharge status: Home / Self Care | Payer: Medicare Other | Source: Ambulatory Visit | Attending: Internal Medicine | Admitting: Internal Medicine

## 2021-11-25 DIAGNOSIS — Z79899 Other long term (current) drug therapy: Secondary | ICD-10-CM | POA: Diagnosis present

## 2021-11-25 LAB — BASIC METABOLIC PANEL
Anion gap: 11 (ref 5–15)
BUN: 20 mg/dL (ref 8–23)
CO2: 26 mmol/L (ref 22–32)
Calcium: 9.2 mg/dL (ref 8.9–10.3)
Chloride: 99 mmol/L (ref 98–111)
Creatinine, Ser: 1.06 mg/dL — ABNORMAL HIGH (ref 0.44–1.00)
GFR, Estimated: 54 mL/min — ABNORMAL LOW (ref >60)
Glucose, Bld: 108 mg/dL — ABNORMAL HIGH (ref 70–99)
Potassium: 4 mmol/L (ref 3.5–5.1)
Sodium: 136 mmol/L (ref 135–145)

## 2021-12-11 ENCOUNTER — Encounter (HOSPITAL_COMMUNITY): Payer: Self-pay | Admitting: Emergency Medicine

## 2021-12-11 ENCOUNTER — Emergency Department (HOSPITAL_COMMUNITY): Payer: Medicare Other

## 2021-12-11 ENCOUNTER — Other Ambulatory Visit: Payer: Self-pay

## 2021-12-11 ENCOUNTER — Inpatient Hospital Stay (HOSPITAL_COMMUNITY)
Admission: EM | Admit: 2021-12-11 | Discharge: 2021-12-18 | DRG: 682 | Disposition: A | Discharge status: Hospice | Payer: Medicare Other | Attending: Internal Medicine | Admitting: Internal Medicine

## 2021-12-11 ENCOUNTER — Inpatient Hospital Stay (HOSPITAL_COMMUNITY): Payer: Medicare Other

## 2021-12-11 DIAGNOSIS — Z515 Encounter for palliative care: Secondary | ICD-10-CM | POA: Diagnosis not present

## 2021-12-11 DIAGNOSIS — I361 Nonrheumatic tricuspid (valve) insufficiency: Secondary | ICD-10-CM | POA: Diagnosis not present

## 2021-12-11 DIAGNOSIS — Z955 Presence of coronary angioplasty implant and graft: Secondary | ICD-10-CM

## 2021-12-11 DIAGNOSIS — Z888 Allergy status to other drugs, medicaments and biological substances status: Secondary | ICD-10-CM

## 2021-12-11 DIAGNOSIS — K7200 Acute and subacute hepatic failure without coma: Secondary | ICD-10-CM | POA: Diagnosis present

## 2021-12-11 DIAGNOSIS — K7460 Unspecified cirrhosis of liver: Secondary | ICD-10-CM

## 2021-12-11 DIAGNOSIS — Z950 Presence of cardiac pacemaker: Secondary | ICD-10-CM

## 2021-12-11 DIAGNOSIS — G4700 Insomnia, unspecified: Secondary | ICD-10-CM | POA: Diagnosis present

## 2021-12-11 DIAGNOSIS — G40909 Epilepsy, unspecified, not intractable, without status epilepticus: Secondary | ICD-10-CM | POA: Diagnosis present

## 2021-12-11 DIAGNOSIS — N183 Chronic kidney disease, stage 3 unspecified: Secondary | ICD-10-CM | POA: Diagnosis present

## 2021-12-11 DIAGNOSIS — N1831 Chronic kidney disease, stage 3a: Secondary | ICD-10-CM | POA: Diagnosis present

## 2021-12-11 DIAGNOSIS — Z7901 Long term (current) use of anticoagulants: Secondary | ICD-10-CM

## 2021-12-11 DIAGNOSIS — Z8673 Personal history of transient ischemic attack (TIA), and cerebral infarction without residual deficits: Secondary | ICD-10-CM

## 2021-12-11 DIAGNOSIS — E875 Hyperkalemia: Secondary | ICD-10-CM | POA: Diagnosis not present

## 2021-12-11 DIAGNOSIS — R54 Age-related physical debility: Secondary | ICD-10-CM | POA: Diagnosis present

## 2021-12-11 DIAGNOSIS — G2581 Restless legs syndrome: Secondary | ICD-10-CM | POA: Diagnosis present

## 2021-12-11 DIAGNOSIS — N1832 Chronic kidney disease, stage 3b: Secondary | ICD-10-CM | POA: Diagnosis not present

## 2021-12-11 DIAGNOSIS — Z885 Allergy status to narcotic agent status: Secondary | ICD-10-CM

## 2021-12-11 DIAGNOSIS — H1131 Conjunctival hemorrhage, right eye: Secondary | ICD-10-CM | POA: Diagnosis present

## 2021-12-11 DIAGNOSIS — I2510 Atherosclerotic heart disease of native coronary artery without angina pectoris: Secondary | ICD-10-CM | POA: Diagnosis present

## 2021-12-11 DIAGNOSIS — I071 Rheumatic tricuspid insufficiency: Secondary | ICD-10-CM | POA: Diagnosis present

## 2021-12-11 DIAGNOSIS — W19XXXA Unspecified fall, initial encounter: Secondary | ICD-10-CM

## 2021-12-11 DIAGNOSIS — E039 Hypothyroidism, unspecified: Secondary | ICD-10-CM | POA: Diagnosis present

## 2021-12-11 DIAGNOSIS — W010XXA Fall on same level from slipping, tripping and stumbling without subsequent striking against object, initial encounter: Secondary | ICD-10-CM | POA: Diagnosis present

## 2021-12-11 DIAGNOSIS — I50811 Acute right heart failure: Secondary | ICD-10-CM | POA: Diagnosis not present

## 2021-12-11 DIAGNOSIS — Y92009 Unspecified place in unspecified non-institutional (private) residence as the place of occurrence of the external cause: Secondary | ICD-10-CM | POA: Diagnosis not present

## 2021-12-11 DIAGNOSIS — R188 Other ascites: Secondary | ICD-10-CM | POA: Diagnosis not present

## 2021-12-11 DIAGNOSIS — I340 Nonrheumatic mitral (valve) insufficiency: Secondary | ICD-10-CM | POA: Diagnosis not present

## 2021-12-11 DIAGNOSIS — E669 Obesity, unspecified: Secondary | ICD-10-CM | POA: Diagnosis present

## 2021-12-11 DIAGNOSIS — R296 Repeated falls: Secondary | ICD-10-CM | POA: Diagnosis present

## 2021-12-11 DIAGNOSIS — F32A Depression, unspecified: Secondary | ICD-10-CM | POA: Diagnosis present

## 2021-12-11 DIAGNOSIS — I272 Pulmonary hypertension, unspecified: Secondary | ICD-10-CM | POA: Diagnosis not present

## 2021-12-11 DIAGNOSIS — Z800 Family history of malignant neoplasm of digestive organs: Secondary | ICD-10-CM

## 2021-12-11 DIAGNOSIS — S0083XA Contusion of other part of head, initial encounter: Principal | ICD-10-CM

## 2021-12-11 DIAGNOSIS — I081 Rheumatic disorders of both mitral and tricuspid valves: Secondary | ICD-10-CM | POA: Diagnosis not present

## 2021-12-11 DIAGNOSIS — N170 Acute kidney failure with tubular necrosis: Principal | ICD-10-CM | POA: Diagnosis present

## 2021-12-11 DIAGNOSIS — M810 Age-related osteoporosis without current pathological fracture: Secondary | ICD-10-CM | POA: Diagnosis present

## 2021-12-11 DIAGNOSIS — I495 Sick sinus syndrome: Secondary | ICD-10-CM | POA: Diagnosis not present

## 2021-12-11 DIAGNOSIS — E785 Hyperlipidemia, unspecified: Secondary | ICD-10-CM | POA: Diagnosis present

## 2021-12-11 DIAGNOSIS — I130 Hypertensive heart and chronic kidney disease with heart failure and stage 1 through stage 4 chronic kidney disease, or unspecified chronic kidney disease: Secondary | ICD-10-CM | POA: Diagnosis not present

## 2021-12-11 DIAGNOSIS — Z961 Presence of intraocular lens: Secondary | ICD-10-CM | POA: Diagnosis present

## 2021-12-11 DIAGNOSIS — E876 Hypokalemia: Secondary | ICD-10-CM | POA: Diagnosis not present

## 2021-12-11 DIAGNOSIS — Z833 Family history of diabetes mellitus: Secondary | ICD-10-CM

## 2021-12-11 DIAGNOSIS — I509 Heart failure, unspecified: Secondary | ICD-10-CM | POA: Diagnosis not present

## 2021-12-11 DIAGNOSIS — I482 Chronic atrial fibrillation, unspecified: Secondary | ICD-10-CM | POA: Diagnosis not present

## 2021-12-11 DIAGNOSIS — R579 Shock, unspecified: Secondary | ICD-10-CM | POA: Diagnosis not present

## 2021-12-11 DIAGNOSIS — Z8249 Family history of ischemic heart disease and other diseases of the circulatory system: Secondary | ICD-10-CM

## 2021-12-11 DIAGNOSIS — I517 Cardiomegaly: Secondary | ICD-10-CM

## 2021-12-11 DIAGNOSIS — N179 Acute kidney failure, unspecified: Secondary | ICD-10-CM

## 2021-12-11 DIAGNOSIS — F419 Anxiety disorder, unspecified: Secondary | ICD-10-CM | POA: Diagnosis present

## 2021-12-11 DIAGNOSIS — N390 Urinary tract infection, site not specified: Secondary | ICD-10-CM | POA: Diagnosis present

## 2021-12-11 DIAGNOSIS — Z801 Family history of malignant neoplasm of trachea, bronchus and lung: Secondary | ICD-10-CM

## 2021-12-11 DIAGNOSIS — I25758 Atherosclerosis of native coronary artery of transplanted heart with other forms of angina pectoris: Secondary | ICD-10-CM | POA: Diagnosis not present

## 2021-12-11 DIAGNOSIS — Z951 Presence of aortocoronary bypass graft: Secondary | ICD-10-CM

## 2021-12-11 DIAGNOSIS — H3530 Unspecified macular degeneration: Secondary | ICD-10-CM | POA: Diagnosis present

## 2021-12-11 DIAGNOSIS — Z79899 Other long term (current) drug therapy: Secondary | ICD-10-CM

## 2021-12-11 DIAGNOSIS — Z66 Do not resuscitate: Secondary | ICD-10-CM | POA: Diagnosis present

## 2021-12-11 DIAGNOSIS — S0003XA Contusion of scalp, initial encounter: Secondary | ICD-10-CM | POA: Diagnosis present

## 2021-12-11 DIAGNOSIS — Z87891 Personal history of nicotine dependence: Secondary | ICD-10-CM

## 2021-12-11 DIAGNOSIS — Z981 Arthrodesis status: Secondary | ICD-10-CM

## 2021-12-11 DIAGNOSIS — I252 Old myocardial infarction: Secondary | ICD-10-CM

## 2021-12-11 DIAGNOSIS — J449 Chronic obstructive pulmonary disease, unspecified: Secondary | ICD-10-CM | POA: Diagnosis present

## 2021-12-11 DIAGNOSIS — Z7982 Long term (current) use of aspirin: Secondary | ICD-10-CM

## 2021-12-11 DIAGNOSIS — B961 Klebsiella pneumoniae [K. pneumoniae] as the cause of diseases classified elsewhere: Secondary | ICD-10-CM | POA: Diagnosis present

## 2021-12-11 DIAGNOSIS — Z9841 Cataract extraction status, right eye: Secondary | ICD-10-CM

## 2021-12-11 DIAGNOSIS — E782 Mixed hyperlipidemia: Secondary | ICD-10-CM | POA: Diagnosis present

## 2021-12-11 DIAGNOSIS — I5082 Biventricular heart failure: Secondary | ICD-10-CM | POA: Diagnosis present

## 2021-12-11 DIAGNOSIS — H9190 Unspecified hearing loss, unspecified ear: Secondary | ICD-10-CM | POA: Diagnosis present

## 2021-12-11 DIAGNOSIS — Z20822 Contact with and (suspected) exposure to covid-19: Secondary | ICD-10-CM | POA: Diagnosis present

## 2021-12-11 DIAGNOSIS — K219 Gastro-esophageal reflux disease without esophagitis: Secondary | ICD-10-CM | POA: Diagnosis present

## 2021-12-11 DIAGNOSIS — R7989 Other specified abnormal findings of blood chemistry: Secondary | ICD-10-CM | POA: Diagnosis present

## 2021-12-11 DIAGNOSIS — Z6838 Body mass index (BMI) 38.0-38.9, adult: Secondary | ICD-10-CM

## 2021-12-11 DIAGNOSIS — I50810 Right heart failure, unspecified: Secondary | ICD-10-CM | POA: Diagnosis not present

## 2021-12-11 DIAGNOSIS — S0011XA Contusion of right eyelid and periocular area, initial encounter: Secondary | ICD-10-CM | POA: Diagnosis present

## 2021-12-11 DIAGNOSIS — Z7189 Other specified counseling: Secondary | ICD-10-CM | POA: Diagnosis not present

## 2021-12-11 DIAGNOSIS — Z45018 Encounter for adjustment and management of other part of cardiac pacemaker: Secondary | ICD-10-CM

## 2021-12-11 DIAGNOSIS — Z79890 Hormone replacement therapy (postmenopausal): Secondary | ICD-10-CM

## 2021-12-11 DIAGNOSIS — I10 Essential (primary) hypertension: Secondary | ICD-10-CM | POA: Diagnosis present

## 2021-12-11 DIAGNOSIS — I5032 Chronic diastolic (congestive) heart failure: Secondary | ICD-10-CM | POA: Diagnosis present

## 2021-12-11 DIAGNOSIS — Z8541 Personal history of malignant neoplasm of cervix uteri: Secondary | ICD-10-CM

## 2021-12-11 DIAGNOSIS — Z83438 Family history of other disorder of lipoprotein metabolism and other lipidemia: Secondary | ICD-10-CM

## 2021-12-11 DIAGNOSIS — K761 Chronic passive congestion of liver: Secondary | ICD-10-CM | POA: Diagnosis not present

## 2021-12-11 LAB — RESP PANEL BY RT-PCR (FLU A&B, COVID) ARPGX2
Influenza A by PCR: NEGATIVE
Influenza B by PCR: NEGATIVE
SARS Coronavirus 2 by RT PCR: NEGATIVE

## 2021-12-11 LAB — COMPREHENSIVE METABOLIC PANEL
ALT: 140 U/L — ABNORMAL HIGH (ref 0–44)
AST: 109 U/L — ABNORMAL HIGH (ref 15–41)
Albumin: 3.7 g/dL (ref 3.5–5.0)
Alkaline Phosphatase: 249 U/L — ABNORMAL HIGH (ref 38–126)
Anion gap: 16 — ABNORMAL HIGH (ref 5–15)
BUN: 67 mg/dL — ABNORMAL HIGH (ref 8–23)
CO2: 20 mmol/L — ABNORMAL LOW (ref 22–32)
Calcium: 8.2 mg/dL — ABNORMAL LOW (ref 8.9–10.3)
Chloride: 98 mmol/L (ref 98–111)
Creatinine, Ser: 2.17 mg/dL — ABNORMAL HIGH (ref 0.44–1.00)
GFR, Estimated: 23 mL/min — ABNORMAL LOW (ref >60)
Glucose, Bld: 133 mg/dL — ABNORMAL HIGH (ref 70–99)
Potassium: 3.5 mmol/L (ref 3.5–5.1)
Sodium: 134 mmol/L — ABNORMAL LOW (ref 135–145)
Total Bilirubin: 1.8 mg/dL — ABNORMAL HIGH (ref 0.3–1.2)
Total Protein: 6.9 g/dL (ref 6.5–8.1)

## 2021-12-11 LAB — HEPATITIS PANEL, ACUTE
HCV Ab: NONREACTIVE
Hep A IgM: NONREACTIVE
Hep B C IgM: NONREACTIVE
Hepatitis B Surface Ag: NONREACTIVE

## 2021-12-11 LAB — CBC WITH DIFFERENTIAL/PLATELET
Abs Immature Granulocytes: 0.03 10*3/uL (ref 0.00–0.07)
Basophils Absolute: 0 10*3/uL (ref 0.0–0.1)
Basophils Relative: 0 %
Eosinophils Absolute: 0 10*3/uL (ref 0.0–0.5)
Eosinophils Relative: 0 %
HCT: 34.6 % — ABNORMAL LOW (ref 36.0–46.0)
Hemoglobin: 10.6 g/dL — ABNORMAL LOW (ref 12.0–15.0)
Immature Granulocytes: 0 %
Lymphocytes Relative: 5 %
Lymphs Abs: 0.5 10*3/uL — ABNORMAL LOW (ref 0.7–4.0)
MCH: 26.2 pg (ref 26.0–34.0)
MCHC: 30.6 g/dL (ref 30.0–36.0)
MCV: 85.4 fL (ref 80.0–100.0)
Monocytes Absolute: 1.3 10*3/uL — ABNORMAL HIGH (ref 0.1–1.0)
Monocytes Relative: 15 %
Neutro Abs: 7.2 10*3/uL (ref 1.7–7.7)
Neutrophils Relative %: 80 %
Platelets: 182 10*3/uL (ref 150–400)
RBC: 4.05 MIL/uL (ref 3.87–5.11)
RDW: 19.9 % — ABNORMAL HIGH (ref 11.5–15.5)
WBC: 9.1 10*3/uL (ref 4.0–10.5)
nRBC: 0 % (ref 0.0–0.2)

## 2021-12-11 LAB — URINALYSIS, ROUTINE W REFLEX MICROSCOPIC
Bilirubin Urine: NEGATIVE
Glucose, UA: NEGATIVE mg/dL
Ketones, ur: NEGATIVE mg/dL
Nitrite: POSITIVE — AB
Protein, ur: NEGATIVE mg/dL
Specific Gravity, Urine: 1.01 (ref 1.005–1.030)
pH: 5 (ref 5.0–8.0)

## 2021-12-11 LAB — ECHOCARDIOGRAM COMPLETE
AR max vel: 1.33 cm2
AV Area VTI: 1.34 cm2
AV Area mean vel: 1.4 cm2
AV Mean grad: 2.9 mmHg
AV Peak grad: 6.3 mmHg
Ao pk vel: 1.26 m/s
Area-P 1/2: 3.85 cm2
Height: 62 in
S' Lateral: 2.4 cm
Weight: 3120 oz

## 2021-12-11 LAB — AMMONIA: Ammonia: 49 umol/L — ABNORMAL HIGH (ref 9–35)

## 2021-12-11 LAB — BRAIN NATRIURETIC PEPTIDE: B Natriuretic Peptide: 1055 pg/mL — ABNORMAL HIGH (ref 0.0–100.0)

## 2021-12-11 LAB — LACTIC ACID, PLASMA: Lactic Acid, Venous: 1.9 mmol/L (ref 0.5–1.9)

## 2021-12-11 LAB — TROPONIN I (HIGH SENSITIVITY): Troponin I (High Sensitivity): 47 ng/L — ABNORMAL HIGH (ref <18)

## 2021-12-11 MED ORDER — LEVOTHYROXINE SODIUM 75 MCG PO TABS
75.0000 ug | ORAL_TABLET | Freq: Every day | ORAL | Status: DC
  Administered 2021-12-12 – 2021-12-18 (×7): 75 ug via ORAL
  Filled 2021-12-11 (×7): qty 1

## 2021-12-11 MED ORDER — UMECLIDINIUM BROMIDE 62.5 MCG/ACT IN AEPB
1.0000 | INHALATION_SPRAY | Freq: Every day | RESPIRATORY_TRACT | Status: DC
Start: 2021-12-11 — End: 2021-12-18
  Administered 2021-12-12 – 2021-12-18 (×7): 1 via RESPIRATORY_TRACT
  Filled 2021-12-11 (×2): qty 7

## 2021-12-11 MED ORDER — OXCARBAZEPINE 300 MG PO TABS
300.0000 mg | ORAL_TABLET | Freq: Two times a day (BID) | ORAL | Status: DC
Start: 2021-12-11 — End: 2021-12-18
  Administered 2021-12-11 – 2021-12-18 (×13): 300 mg via ORAL
  Filled 2021-12-11 (×19): qty 1

## 2021-12-11 MED ORDER — LEVOTHYROXINE SODIUM 50 MCG PO TABS: 50.0000 ug | ORAL_TABLET | Freq: Every day | ORAL | Status: DC

## 2021-12-11 MED ORDER — SERTRALINE HCL 100 MG PO TABS
100.0000 mg | ORAL_TABLET | Freq: Every day | ORAL | Status: DC
  Administered 2021-12-11 – 2021-12-18 (×8): 100 mg via ORAL
  Filled 2021-12-11 (×2): qty 1
  Filled 2021-12-11: qty 2
  Filled 2021-12-11 (×2): qty 1
  Filled 2021-12-11: qty 2
  Filled 2021-12-11 (×2): qty 1

## 2021-12-11 MED ORDER — ACETAMINOPHEN 650 MG RE SUPP: 650.0000 mg | Freq: Four times a day (QID) | RECTAL | Status: DC | PRN

## 2021-12-11 MED ORDER — SODIUM CHLORIDE 0.9 % IV SOLN: INTRAVENOUS | Status: AC

## 2021-12-11 MED ORDER — FLUTICASONE FUROATE-VILANTEROL 100-25 MCG/ACT IN AEPB
1.0000 | INHALATION_SPRAY | Freq: Every day | RESPIRATORY_TRACT | Status: DC
  Administered 2021-12-12 – 2021-12-18 (×7): 1 via RESPIRATORY_TRACT
  Filled 2021-12-11 (×2): qty 28

## 2021-12-11 MED ORDER — ONDANSETRON HCL 4 MG PO TABS: 4.0000 mg | ORAL_TABLET | Freq: Four times a day (QID) | ORAL | Status: DC | PRN

## 2021-12-11 MED ORDER — PRAMIPEXOLE DIHYDROCHLORIDE 1 MG PO TABS
1.0000 mg | ORAL_TABLET | Freq: Every day | ORAL | Status: DC
  Administered 2021-12-11 – 2021-12-17 (×7): 1 mg via ORAL
  Filled 2021-12-11 (×8): qty 1

## 2021-12-11 MED ORDER — PANTOPRAZOLE SODIUM 40 MG PO TBEC
40.0000 mg | DELAYED_RELEASE_TABLET | Freq: Every day | ORAL | Status: DC
  Administered 2021-12-11 – 2021-12-17 (×7): 40 mg via ORAL
  Filled 2021-12-11 (×7): qty 1

## 2021-12-11 MED ORDER — DICLOFENAC SODIUM 1 % EX GEL
4.0000 g | Freq: Four times a day (QID) | CUTANEOUS | Status: DC
  Administered 2021-12-11 – 2021-12-17 (×16): 4 g via TOPICAL
  Filled 2021-12-11 (×4): qty 100

## 2021-12-11 MED ORDER — FENTANYL CITRATE PF 50 MCG/ML IJ SOSY
50.0000 ug | PREFILLED_SYRINGE | Freq: Once | INTRAMUSCULAR | Status: AC
  Administered 2021-12-11: 50 ug via INTRAVENOUS
  Filled 2021-12-11: qty 1

## 2021-12-11 MED ORDER — ACETAMINOPHEN 325 MG PO TABS
650.0000 mg | ORAL_TABLET | Freq: Four times a day (QID) | ORAL | Status: DC | PRN
  Administered 2021-12-11 – 2021-12-16 (×6): 650 mg via ORAL
  Filled 2021-12-11 (×9): qty 2

## 2021-12-11 MED ORDER — SODIUM CHLORIDE 0.9 % IV SOLN
1.0000 g | INTRAVENOUS | Status: DC
  Administered 2021-12-11 – 2021-12-13 (×3): 1 g via INTRAVENOUS
  Filled 2021-12-11 (×3): qty 10

## 2021-12-11 MED ORDER — ONDANSETRON HCL 4 MG/2ML IJ SOLN: 4.0000 mg | Freq: Four times a day (QID) | INTRAMUSCULAR | Status: DC | PRN

## 2021-12-11 NOTE — Assessment & Plan Note (Addendum)
Patient reports that she lost her balance while walking in the middle of the night.  Denies any dizziness or loss of consciousness.  She felt "loopy" due to her medications ?CT imaging did not indicate any fractures or intracranial injuries.  She is noted to have scalp hematoma as well as some conjunctival hemorrhage in her right eye ? ?Seen by physical therapy with recommendations for skilled nursing facility placement ?

## 2021-12-11 NOTE — ED Provider Notes (Signed)
?[facility] ?Provider Note ? ? ?CSN: [id number] ?Arrival date & time: 12/11/21  0557 ? ?  ? ?History ? ?Chief Complaint  ?Patient presents with  ? Fall  ? ? ?[name] is a 76 y.o. female. ? ?Sent to the emergency department from assisted living after a fall.  Patient attempted to get up and go to the bathroom and fell.  She has bruising around her right eye.  Patient denies extremity injury.  She does take Eliquis. ? ? ?  ? ?Home Medications ?Prior to Admission medications   ?Medication Sig Start Date End Date Taking? Authorizing Provider  ?acetaminophen (TYLENOL) 325 MG tablet Take 650 mg by mouth every 6 (six) hours as needed for moderate pain.    [provider]  ?albuterol (PROVENTIL HFA;VENTOLIN HFA) 108 (90 Base) MCG/ACT inhaler Inhale 1-2 puffs into the lungs every 6 (six) hours as needed for wheezing or shortness of breath. 04/19/17   Rigoberto Noel, MD  ?apixaban (ELIQUIS) 5 MG TABS tablet Take 1 tablet (5 mg total) by mouth 2 (two) times daily. 11/11/21   Evans Lance, MD  ?atorvastatin (LIPITOR) 40 MG tablet TAKE ONE (1) TABLET BY MOUTH EACH DAY 12/09/20   Jerline Pain, MD  ?furosemide (LASIX) 40 MG tablet Take 1 tablet (40 mg total) by mouth 2 (two) times daily. 11/11/21   Evans Lance, MD  ?isosorbide mononitrate (IMDUR) 60 MG 24 hr tablet TAKE ONE (1) TABLET BY MOUTH EACH DAY ?Patient not taking: Reported on 11/11/2021 12/09/20   Jerline Pain, MD  ?levothyroxine (SYNTHROID) 50 MCG tablet Take 50 mcg by mouth daily before breakfast. 02/09/21   [provider]  ?metoprolol tartrate (LOPRESSOR) 25 MG tablet Take 25 mg by mouth 2 (two) times daily.    [provider]  ?nitroGLYCERIN (NITROSTAT) 0.4 MG SL tablet Place 0.4 mg under the tongue every 5 (five) minutes as needed for chest pain.    [provider]  ?Oxcarbazepine (TRILEPTAL) 300 MG tablet Take 1 tablet twice daily ?Patient taking differently: Take 300 mg by mouth 2 (two) times  daily. 02/14/20   Cameron Sprang, MD  ?pantoprazole (PROTONIX) 40 MG tablet Take 40 mg by mouth at bedtime.    [provider]  ?potassium chloride (KLOR-CON) 10 MEQ tablet Take 10 mEq by mouth daily.    [provider]  ?pramipexole (MIRAPEX) 1.5 MG tablet Take 1.5 mg by mouth at bedtime.    [provider]  ?sertraline (ZOLOFT) 100 MG tablet TAKE 1 TABLET BY MOUTH AT BEDTIME ?Patient taking differently: Take 100 mg by mouth daily. 04/06/21   Cameron Sprang, MD  ?traZODone (DESYREL) 50 MG tablet Take 25 mg by mouth 2 (two) times daily.    [provider]  ?Donnal Debar 100-62.5-25 MCG/ACT AEPB Inhale 1 puff into the lungs daily. 08/16/21   [provider]  ?   ? ?Allergies    ?Iohexol and Codeine   ? ?Review of Systems   ?Review of Systems ? ?Physical Exam ?Updated Vital Signs ?BP (!) 102/48 (BP Location: Left Arm)   Pulse 61   Temp (!) 97.5 ?F (36.4 ?C) (Oral)   Resp 20   Ht '5\' 2"'$  (1.575 m)   Wt 88.5 kg   SpO2 90%   BMI 35.67 kg/m?  ?Physical Exam ?Vitals and nursing note reviewed.  ?Constitutional:   ?   General: She is not in acute distress. ?   Appearance: She is  well-developed.  ?HENT:  ?   Head: Normocephalic. Contusion (Right forehead and periorbital) present.  ?   Mouth/Throat:  ?   Mouth: Mucous membranes are moist.  ?Eyes:  ?   General: Vision grossly intact. Gaze aligned appropriately.  ?   Extraocular Movements: Extraocular movements intact.  ?   Conjunctiva/sclera:  ?   Right eye: Hemorrhage (Lateral) present.  ? ?Cardiovascular:  ?   Rate and Rhythm: Normal rate and regular rhythm.  ?   Pulses: Normal pulses.  ?   Heart sounds: Normal heart sounds, S1 normal and S2 normal. No murmur heard. ?  No friction rub. No gallop.  ?Pulmonary:  ?   Effort: Pulmonary effort is normal. No respiratory distress.  ?   Breath sounds: Normal breath sounds.  ?Abdominal:  ?   General: Bowel sounds are normal.  ?   Palpations: Abdomen is soft.  ?   Tenderness: There  is no abdominal tenderness. There is no guarding or rebound.  ?   Hernia: No hernia is present.  ?Musculoskeletal:     ?   General: No swelling.  ?   Cervical back: Full passive range of motion without pain, normal range of motion and neck supple. No spinous process tenderness or muscular tenderness. Normal range of motion.  ?   Right hip: Normal.  ?   Left hip: Normal.  ?   Right lower leg: No edema.  ?   Left lower leg: No edema.  ?Skin: ?   General: Skin is warm and dry.  ?   Capillary Refill: Capillary refill takes less than 2 seconds.  ?   Findings: No ecchymosis, erythema, rash or wound.  ?Neurological:  ?   General: No focal deficit present.  ?   Mental Status: She is alert.  ?   GCS: GCS eye subscore is 4. GCS verbal subscore is 5. GCS motor subscore is 6.  ?   Cranial Nerves: Cranial nerves 2-12 are intact.  ?   Sensory: Sensation is intact.  ?   Motor: Motor function is intact.  ?   Coordination: Coordination is intact.  ?   Comments: appears sleepy but appropriate  ?Psychiatric:     ?   Attention and Perception: Attention normal.     ?   Mood and Affect: Mood normal.     ?   Speech: Speech normal.     ?   Behavior: Behavior normal.  ? ? ?ED Results / Procedures / Treatments   ?Labs ?(all labs ordered are listed, but only abnormal results are displayed) ?Labs Reviewed  ?CBC WITH DIFFERENTIAL/PLATELET  ?COMPREHENSIVE METABOLIC PANEL  ?URINALYSIS, ROUTINE W REFLEX MICROSCOPIC  ?BRAIN NATRIURETIC PEPTIDE  ?LACTIC ACID, PLASMA  ?TROPONIN I (HIGH SENSITIVITY)  ? ? ?EKG ?None ? ?Radiology ?No results found. ? ?Procedures ?Procedures  ? ? ?Medications Ordered in ED ?Medications - No data to display ? ?ED Course/ Medical Decision Making/ A&P ?  ?                        ?Medical Decision Making ?Amount and/or Complexity of Data Reviewed ?Labs: ordered. ?Radiology: ordered. ? ? ?Patient brought to the emergency department for evaluation after a fall.  Patient reports that she attempted to get up to go to the  bathroom and lost her balance, fell injuring the right side of her face and forehead.  She does take Xarelto. ? ?Differential diagnosis includes facial contusion, maxillofacial fracture, intracranial  hemorrhage due to trauma. ? ?Patient noted to be mildly hypoxic and hypotensive at arrival.  Unclear how this related to her fall which initially sounded mechanical.  Reviewing her records reveals history of COPD as well as congestive heart failure.  Work-up for COPD, pneumonia, CHF exacerbation initiated. ? ?Check urine for infection, concentration, electrolytes and kidney function.  Will sign to oncoming ER physician to follow-up on results. ? ? ? ? ? ? ? ?Final Clinical Impression(s) / ED Diagnoses ?Final diagnoses:  ?Facial contusion, initial encounter  ?Subconjunctival hemorrhage of right eye  ? ? ?Rx / DC Orders ?ED Discharge Orders   ? ? None  ? ?  ? ? ?  ?Orpah Greek, MD ?12/11/21 [phone] ? ?

## 2021-12-11 NOTE — Assessment & Plan Note (Addendum)
Acute right sided heart failure ?-Echo with EF 32-41%, RV systolic function severely reduced. Normal PA pressure. ?-She has elevated BNP of 1000, elevated creatinine, elevated LFTs ?-Blood pressure low since admission, holding metoprolol ?-lactate 1.9 on admission ?-discussed with Dr. Haroldine Laws who felt that patient would benefit from transfer to [facility] for evaluation by advanced heart failure team ?-anticipate that she will need RHC ?-recommended to start on milrinone for now ?-VQ scan has been ordered to rule out PE, although she was taking eliquis prior to admission ?-would defer further diuretic dosing to cardiology ?

## 2021-12-11 NOTE — H&P (Signed)
?History and Physical  ? ? ?Patient: [name] QVZ:[id number] DOB: 04/11/1946 ?DOA: 12/11/2021 ?DOS: the patient was seen and examined on 12/11/2021 ?PCP: [facility]  ?Patient coming from:  Independent living ? ?Chief Complaint:  ?Chief Complaint  ?Patient presents with  ? Fall  ? ?HPI: [name] is a 76 y.o. female with medical history significant of chronic kidney disease stage IIIa, COPD, atrial fibrillation on anticoagulation, sick sinus syndrome status post pacemaker, restless leg syndrome, had been evaluated at [facility] on 3/15 for pain in her lower extremities.  Labs were checked and she was noted to have elevated creatinine above her baseline.  She received a liter of IV fluids was discharged home with recommendations to follow-up with her physicians.  She did receive some extra gabapentin for her pain and her legs.  Upon returning home, she reports feeling "loopy" and reports stumbling while she was trying to reach for her walker.  She did have a fall.  Denies any loss of consciousness.  She has not had any recent vomiting, diarrhea, cough, fever. ? ?Upon arrival to the emergency room, labs show that she continues to have elevated creatinine, has elevated LFTs.  Further imaging does indicate cardiomegaly and elevated BNP.  Urinalysis indicates possible infection.  She has been referred for admission. ? ?Review of Systems: As mentioned in the history of present illness. All other systems reviewed and are negative. ?Past Medical History:  ?Diagnosis Date  ? Anxiety   ? Arthritis   ? "all over" (07/29/2017)  ? Bulging disc   ? "still have one in my neck" (07/29/2017)  ? Carotid arterial disease ([place])   ? a. s/p L intracranial ICA stenting ([place]).  ? Cataract   ? "left eye; didn't take it out cause of macular degeneration" (07/29/2017)  ? Cervical cancer (HCC)   ? hx  ? Chronic kidney disease, stage III (moderate) (HCC)   ? "only have 1 working kidney" (07/29/2017)  ? COPD  (chronic obstructive pulmonary disease) ([place])   ? a. quit smoking in 2007.  ? Coronary artery disease   ? a. 2007 s/p MI and LAD stenting;  10/2006 CABG x 4: LIMA->LAD, VG->OM->dLCX, VG->RPL;  c. 07/2013 MV: no ischemia.  ? Depression   ? Esophageal reflux   ? Hepatitis 1968  ? "don't remember which one"  ? History of blood transfusion 10/29/2006  ? "related to bypass OR"  ? History of echocardiogram   ? a. 01/2012 Echo: EF 55%, mildly dil LA/RV, mild MR, Ao Sclerosis, mod TR.  ? Hypothyroid   ? Insomnia   ? Macular degeneration   ? "left eye" (07/29/2017)  ? Mixed hyperlipidemia   ? NSTEMI (non-ST elevated myocardial infarction) ([place]) 08/2006; 10/2006  ? Osteoporosis   ? Pneumonia   ? "several times before I quit smoking" (07/29/2017)  ? Presence of permanent cardiac pacemaker   ? PVT (paroxysmal ventricular tachycardia)   ? Restless leg syndrome   ? Stroke [facility]) ?2008  ? denies residual on 07/29/2017  ? Symptomatic bradycardia   ? a. s/p BSX Advantio dual chamber PPM Beckie Salts).  ? Syncope and collapse   ? Unspecified hearing loss   ? ?Past Surgical History:  ?Procedure Laterality Date  ? ABDOMINAL HYSTERECTOMY  1970s  ? "partial"  ? ANTERIOR CERVICAL DECOMP/DISCECTOMY FUSION  03/2001; 04/2005;?  date for 3rd OR  ? "used bone from my hips each time"  ? BACK SURGERY    ? CARDIAC CATHETERIZATION  2008  ? CATARACT EXTRACTION W/ INTRAOCULAR LENS IMPLANT Right 05/2013  ? CEREBRAL ANGIOGRAM Left 02/14/2007  ? w/ICA stent/notes 11/04/2010  ? COLONOSCOPY    ? CORONARY ANGIOPLASTY WITH STENT PLACEMENT  08/2006  ? "2 stents"  ? CORONARY ARTERY BYPASS GRAFT  10/2006  ? "CABG X4"  ? EYE SURGERY  05/2013  ? cataract removed  ? INSERT / REPLACE / REMOVE PACEMAKER  03/2012  ? New [address], [place]; [place];[employer]  ? KNEE ARTHROSCOPY Bilateral X 7  ? "4 on my left; 3 on my right" (07/29/2017)  ? LAPAROSCOPIC CHOLECYSTECTOMY    ? [place] N/A 06/29/2021  ? Procedure: PPM GENERATOR CHANGEOUT;  Surgeon: Evans Lance, MD;   Location: [facility];  Service: Cardiovascular;  Laterality: N/A;  ? TUBAL LIGATION    ? ?Social History:  reports that she quit smoking about 15 years ago. Her smoking use included cigarettes. She started smoking about 56 years ago. She has a 30.00 pack-year smoking history. She has never used smokeless tobacco. She reports current alcohol use of about 1.0 standard drink per week. She reports that she does not use drugs. ? ?Allergies  ?Allergen Reactions  ? Iohexol Anaphylaxis  ?   Desc: PT HAD CONTRAST REACTION YRS AGO NEEDS 13 HR PREP FOR ALL CONTRAST STUDIES ?  ? Codeine   ?  swelling  ? ? ?Family History  ?Problem Relation Age of Onset  ? Stomach cancer Mother   ? Diabetes Mother   ? Hyperlipidemia Mother   ? Hypertension Father   ? Lung cancer Father   ? Alcohol abuse Father   ? Early death Brother   ? ? ?Prior to Admission medications   ?Medication Sig Start Date End Date Taking? Authorizing Provider  ?acetaminophen (TYLENOL) 325 MG tablet Take 650 mg by mouth every 6 (six) hours as needed for moderate pain.   Yes [provider]  ?albuterol (PROVENTIL HFA;VENTOLIN HFA) 108 (90 Base) MCG/ACT inhaler Inhale 1-2 puffs into the lungs every 6 (six) hours as needed for wheezing or shortness of breath. 04/19/17  Yes Rigoberto Noel, MD  ?apixaban (ELIQUIS) 5 MG TABS tablet Take 1 tablet (5 mg total) by mouth 2 (two) times daily. 11/11/21  Yes Evans Lance, MD  ?aspirin EC 81 MG tablet Take 81 mg by mouth every evening. Swallow whole.   Yes [provider]  ?atorvastatin (LIPITOR) 40 MG tablet TAKE ONE (1) TABLET BY MOUTH EACH DAY ?Patient taking differently: Take 20 mg by mouth daily. 12/09/20  Yes Jerline Pain, MD  ?cetirizine (ZYRTEC) 10 MG tablet Take 10 mg by mouth at bedtime. 12/03/21  Yes [provider]  ?Ferrous Sulfate (IRON) 325 (65 Fe) MG TABS Take by mouth.   Yes [provider]  ?folic acid (FOLVITE) 1 MG tablet Take 1 mg by mouth daily.   Yes [provider]  ?furosemide (LASIX) 40 MG tablet Take 1 tablet (40 mg total) by mouth 2 (two) times daily. ?Patient taking differently: Take 80 mg by mouth 2 (two) times daily. 11/11/21  Yes Evans Lance, MD  ?gabapentin (NEURONTIN) 300 MG capsule Take 300 mg by mouth 3 (three) times daily. 12/10/21 01/10/22 Yes [provider]  ?isosorbide mononitrate (IMDUR) 60 MG 24 hr tablet TAKE ONE (1) TABLET BY MOUTH EACH DAY 12/09/20  Yes Jerline Pain, MD  ?lactobacillus acidophilus (BACID) TABS tablet Take 2 tablets by mouth 3 (three) times daily.   Yes  [provider]  ?levothyroxine (SYNTHROID) 75 MCG tablet Take 75 mcg by mouth every morning. 11/29/21  Yes [provider]  ?metoprolol tartrate (LOPRESSOR) 25 MG tablet Take 25 mg by mouth 2 (two) times daily.   Yes [provider]  ?nitroGLYCERIN (NITROSTAT) 0.4 MG SL tablet Place 0.4 mg under the tongue every 5 (five) minutes as needed for chest pain.   Yes [provider]  ?Oxcarbazepine (TRILEPTAL) 300 MG tablet Take 1 tablet twice daily ?Patient taking differently: Take 300 mg by mouth 2 (two) times daily. 02/14/20  Yes Cameron Sprang, MD  ?pantoprazole (PROTONIX) 40 MG tablet Take 40 mg by mouth at bedtime.   Yes [provider]  ?potassium chloride (KLOR-CON) 10 MEQ tablet Take 10 mEq by mouth daily.   Yes [provider]  ?sertraline (ZOLOFT) 100 MG tablet TAKE 1 TABLET BY MOUTH AT BEDTIME ?Patient taking differently: Take 100 mg by mouth daily. 04/06/21  Yes Cameron Sprang, MD  ?traZODone (DESYREL) 50 MG tablet Take 50 mg by mouth at bedtime.   Yes [provider]  ?triamcinolone cream (KENALOG) 0.1 % Apply 1 application. topically 2 (two) times daily. 12/03/21  Yes [provider]  ? ? ?Physical Exam: ?Vitals:  ? 12/11/21 1530 12/11/21 1730 12/11/21 1838 12/11/21 1845  ?BP: 102/64 (!) 141/55 (!) 88/60 111/76  ?Pulse: 74 71 62 67  ?Resp: 15 20 (!) 24 19  ?Temp:      ?TempSrc:      ?SpO2:  100% 97% 97% 97%  ?Weight:      ?Height:      ? ?General exam: Alert, awake, oriented x 3 ?HEENT: Conjunctival hemorrhage on right eye, significant bruising noted around right orbit ?Respiratory system: Clear to

## 2021-12-11 NOTE — ED Notes (Signed)
Attempted to call report X 2. 

## 2021-12-11 NOTE — ED Notes (Signed)
Patient transported to CT 

## 2021-12-11 NOTE — Assessment & Plan Note (Signed)
Pacemaker insertion for sick sinus syndrome ?

## 2021-12-11 NOTE — Progress Notes (Signed)
Echocardiogram ?2D Echocardiogram has been performed. ? ?Oneal Deputy Benton Tooker RDCS ?12/11/2021, 2:00 PM ?

## 2021-12-11 NOTE — Assessment & Plan Note (Signed)
Confirmed with patient.

## 2021-12-11 NOTE — ED Provider Notes (Signed)
Signout from Dr. Waverly Ferrari.  Patient presenting after a fall, has some bruising around her right eye.  She is on blood thinners.  Plan is to follow-up on labs and imaging.  If no acute findings patient can be ambulated to see if she can return to her facility.  Blood pressure soft. ?Physical Exam  ?BP (!) 102/48 (BP Location: Left Arm)   Pulse 61   Temp (!) 97.5 ?F (36.4 ?C) (Oral)   Resp 20   Ht '5\' 2"'$  (1.575 m)   Wt 88.5 kg   SpO2 90%   BMI 35.67 kg/m?  ? ?Physical Exam ? ?Procedures  ?Procedures ? ?ED Course / MDM  ?  ?Medical Decision Making ?Amount and/or Complexity of Data Reviewed ?Labs: ordered. ?Radiology: ordered. ? ?Risk ?Prescription drug management. ?Decision regarding hospitalization. ? ? ?Patient's labs concerning for new elevations of LFTs, new AKI, elevated BNP.  Blood pressure soft holding on diuresis.  Discussed with [facility] hospitalist who will evaluate for admission.  ? ? ? ? ?  ?Hayden Rasmussen, MD ?12/11/21 1741 ? ?

## 2021-12-11 NOTE — ED Triage Notes (Signed)
Pt brought in from [facility] by [facility] after having fall tonight attempting to get out of bed to use the bathroom. Pt has bruising to right eye and forehead. Pt currently takes Eliquis. ?

## 2021-12-11 NOTE — ED Notes (Signed)
Attempted to call report X 1. 

## 2021-12-11 NOTE — Assessment & Plan Note (Signed)
No complaints of chest pain ?Continue statin ?Beta-blocker, Imdur had been initially held due to soft blood pressures ?

## 2021-12-11 NOTE — Assessment & Plan Note (Addendum)
Creatinine of 2.1 on admission ?Baseline creatinine of 1.0, 2 weeks ago ?No hydronephrosis on imaging ?Likely related to low cardiac output ?Continue to follow on milrinone/diuresis ?

## 2021-12-11 NOTE — Assessment & Plan Note (Signed)
Holding metoprolol due to soft blood pressures ?

## 2021-12-11 NOTE — Assessment & Plan Note (Signed)
Status post permanent pacemaker ?

## 2021-12-11 NOTE — Assessment & Plan Note (Addendum)
Heart rate is currently stable ?Chronically on Eliquis  ?holding anticoagulation in light of recent fall and head injury ?Metoprolol held on admission due to low blood pressures ?

## 2021-12-11 NOTE — Assessment & Plan Note (Signed)
Continue on Trileptal ?

## 2021-12-11 NOTE — ED Notes (Signed)
Hospitalist at bedside 

## 2021-12-11 NOTE — Assessment & Plan Note (Signed)
Continue Synthroid °

## 2021-12-11 NOTE — ED Notes (Signed)
Echo at bedside

## 2021-12-11 NOTE — Assessment & Plan Note (Signed)
No shortness of breath or wheezing at this time ?Continue home regimen ?

## 2021-12-11 NOTE — Progress Notes (Signed)
Report attempted before bed approved. ?

## 2021-12-11 NOTE — Assessment & Plan Note (Addendum)
New finding of possible nodularity of liver on CT imaging ?We will check right upper quadrant ultrasound for further evaluation ?May possibly be related to right-sided heart failure ? ?Ammonia level 49, started on lactulose ?

## 2021-12-11 NOTE — Assessment & Plan Note (Addendum)
Urine culture positive for klebsiella ?Started on ceftriaxone ?Follow-up culture sensitivities ?

## 2021-12-11 NOTE — Assessment & Plan Note (Signed)
Chronic kidney disease stage IIIb ?Baseline creatinine 1.0 ?Continue to monitor ?

## 2021-12-11 NOTE — Assessment & Plan Note (Addendum)
Possibly related to right sided heart failure ?Viral hepatitis panel negative ?Continue to monitor  ?

## 2021-12-11 NOTE — Assessment & Plan Note (Signed)
Continue statin. 

## 2021-12-11 NOTE — Progress Notes (Signed)
Reported attempted to call twice and patient has not been approved yet. Patient has had yellow and red MEWS due to low blood pressure, elevated respirations, and low heart rate. House supervisor is going to evaluate patient before I accept this patient. ?

## 2021-12-12 ENCOUNTER — Inpatient Hospital Stay (HOSPITAL_COMMUNITY): Payer: Medicare Other

## 2021-12-12 DIAGNOSIS — N179 Acute kidney failure, unspecified: Secondary | ICD-10-CM | POA: Diagnosis not present

## 2021-12-12 DIAGNOSIS — N390 Urinary tract infection, site not specified: Secondary | ICD-10-CM | POA: Diagnosis not present

## 2021-12-12 DIAGNOSIS — I50811 Acute right heart failure: Secondary | ICD-10-CM

## 2021-12-12 DIAGNOSIS — N183 Chronic kidney disease, stage 3 unspecified: Secondary | ICD-10-CM | POA: Diagnosis not present

## 2021-12-12 DIAGNOSIS — I482 Chronic atrial fibrillation, unspecified: Secondary | ICD-10-CM | POA: Diagnosis not present

## 2021-12-12 LAB — CBC
HCT: 34.7 % — ABNORMAL LOW (ref 36.0–46.0)
Hemoglobin: 10.5 g/dL — ABNORMAL LOW (ref 12.0–15.0)
MCH: 25.9 pg — ABNORMAL LOW (ref 26.0–34.0)
MCHC: 30.3 g/dL (ref 30.0–36.0)
MCV: 85.5 fL (ref 80.0–100.0)
Platelets: 184 10*3/uL (ref 150–400)
RBC: 4.06 MIL/uL (ref 3.87–5.11)
RDW: 19.7 % — ABNORMAL HIGH (ref 11.5–15.5)
WBC: 9.4 10*3/uL (ref 4.0–10.5)
nRBC: 0 % (ref 0.0–0.2)

## 2021-12-12 LAB — COMPREHENSIVE METABOLIC PANEL
ALT: 151 U/L — ABNORMAL HIGH (ref 0–44)
AST: 102 U/L — ABNORMAL HIGH (ref 15–41)
Albumin: 3.7 g/dL (ref 3.5–5.0)
Alkaline Phosphatase: 265 U/L — ABNORMAL HIGH (ref 38–126)
Anion gap: 18 — ABNORMAL HIGH (ref 5–15)
BUN: 70 mg/dL — ABNORMAL HIGH (ref 8–23)
CO2: 20 mmol/L — ABNORMAL LOW (ref 22–32)
Calcium: 8.5 mg/dL — ABNORMAL LOW (ref 8.9–10.3)
Chloride: 98 mmol/L (ref 98–111)
Creatinine, Ser: 2.29 mg/dL — ABNORMAL HIGH (ref 0.44–1.00)
GFR, Estimated: 22 mL/min — ABNORMAL LOW (ref >60)
Glucose, Bld: 148 mg/dL — ABNORMAL HIGH (ref 70–99)
Potassium: 4 mmol/L (ref 3.5–5.1)
Sodium: 136 mmol/L (ref 135–145)
Total Bilirubin: 2.3 mg/dL — ABNORMAL HIGH (ref 0.3–1.2)
Total Protein: 6.8 g/dL (ref 6.5–8.1)

## 2021-12-12 LAB — GLUCOSE, CAPILLARY: Glucose-Capillary: 144 mg/dL — ABNORMAL HIGH (ref 70–99)

## 2021-12-12 LAB — MRSA NEXT GEN BY PCR, NASAL: MRSA by PCR Next Gen: NOT DETECTED

## 2021-12-12 MED ORDER — CHLORHEXIDINE GLUCONATE CLOTH 2 % EX PADS
6.0000 | MEDICATED_PAD | Freq: Every day | CUTANEOUS | Status: DC
  Administered 2021-12-12 – 2021-12-18 (×6): 6 via TOPICAL

## 2021-12-12 MED ORDER — FUROSEMIDE 10 MG/ML IJ SOLN
20.0000 mg | Freq: Once | INTRAMUSCULAR | Status: AC
Start: 2021-12-12 — End: 2021-12-12
  Administered 2021-12-12: 20 mg via INTRAVENOUS
  Filled 2021-12-12: qty 2

## 2021-12-12 MED ORDER — TECHNETIUM TO 99M ALBUMIN AGGREGATED
4.0000 | Freq: Once | INTRAVENOUS | Status: AC | PRN
  Administered 2021-12-12: 4.05 via INTRAVENOUS

## 2021-12-12 MED ORDER — MILRINONE LACTATE IN DEXTROSE 20-5 MG/100ML-% IV SOLN
0.1250 ug/kg/min | INTRAVENOUS | Status: DC
  Administered 2021-12-12: 0.125 ug/kg/min via INTRAVENOUS
  Administered 2021-12-14 – 2021-12-15 (×3): 0.25 ug/kg/min via INTRAVENOUS
  Administered 2021-12-16 – 2021-12-18 (×2): 0.125 ug/kg/min via INTRAVENOUS
  Filled 2021-12-12 (×7): qty 100

## 2021-12-12 MED ORDER — MILRINONE LACTATE IN DEXTROSE 20-5 MG/100ML-% IV SOLN
INTRAVENOUS | Status: AC
  Filled 2021-12-12: qty 100

## 2021-12-12 MED ORDER — LACTULOSE 10 GM/15ML PO SOLN
20.0000 g | Freq: Two times a day (BID) | ORAL | Status: DC
  Administered 2021-12-12 – 2021-12-14 (×2): 20 g via ORAL
  Filled 2021-12-12 (×4): qty 30

## 2021-12-12 MED ORDER — METOPROLOL TARTRATE 25 MG PO TABS: 12.5000 mg | ORAL_TABLET | Freq: Two times a day (BID) | ORAL | Status: DC

## 2021-12-12 NOTE — Hospital Course (Signed)
76 y/o female admitted to the hospital after having a fall. She reported slipping and falling when she was reaching for her walker.  She felt as though she may have been somewhat sedated from pain medication she received earlier in the day.  Patient is chronically on Eliquis for atrial fibrillation.  Upon arrival to the emergency room, CT imaging did not indicate any fractures or intracranial hemorrhage.  She did have significant ecchymosis around her right orbit, right frontal scalp hematoma as well as some right conjunctival hemorrhage.  Vision remained intact.  Her work-up showed acute kidney injury, elevated LFTs, elevated BNP and cardiomegaly.  Echocardiogram showed preserved ejection fraction with severely reduced RV function.  Blood pressures remain 90s to 100s.  She was also noted to have urinary tract infection.  Echocardiogram and cardiac status reviewed with Dr. Haroldine Laws who agreed that patient would benefit from transfer to [facility] for advanced heart failure evaluation.  May need right heart cath.  Recommended starting on milrinone for now. ?

## 2021-12-12 NOTE — Progress Notes (Addendum)
Tele called stating patient's hr was in the 200s. Went to check on patient and patient was asymptomatic. Hr then went down in the 60s. MD Memon notified.  ? ?EKG was ordered and completed. Vital signs were also taken. BP 117/55, hr 59, resp 17, oxygen 100% on 2L. Patient was also placed back on metoprolol. ?

## 2021-12-12 NOTE — Plan of Care (Signed)
?  Problem: Education: ?Goal: Knowledge of General Education information will improve ?Description: Including pain rating scale, medication(s)/side effects and non-pharmacologic comfort measures ?Outcome: Progressing ?  ?Problem: Health Behavior/Discharge Planning: ?Goal: Ability to manage health-related needs will improve ?Outcome: Progressing ?  ?Problem: Clinical Measurements: ?Goal: Ability to maintain clinical measurements within normal limits will improve ?Outcome: Progressing ?Goal: Will remain free from infection ?Outcome: Progressing ?Goal: Diagnostic test results will improve ?Outcome: Progressing ?Goal: Respiratory complications will improve ?Outcome: Progressing ?Goal: Cardiovascular complication will be avoided ?Outcome: Progressing ?  ?Problem: Activity: ?Goal: Risk for activity intolerance will decrease ?Outcome: Progressing ?  ?Problem: Nutrition: ?Goal: Adequate nutrition will be maintained ?Outcome: Progressing ?  ?Problem: Coping: ?Goal: Level of anxiety will decrease ?Outcome: Progressing ?  ?Problem: Elimination: ?Goal: Will not experience complications related to bowel motility ?Outcome: Progressing ?Goal: Will not experience complications related to urinary retention ?Outcome: Progressing ?  ?Problem: Pain Managment: ?Goal: General experience of comfort will improve ?Outcome: Progressing ?  ?Problem: Safety: ?Goal: Ability to remain free from injury will improve ?Outcome: Progressing ?  ?Problem: Acute Rehab PT Goals(only PT should resolve) ?Goal: Pt Will Go Supine/Side To Sit ?Outcome: Progressing ?Flowsheets (Taken 12/12/2021 1031) ?Pt will go Supine/Side to Sit: with min guard assist ?Goal: Patient Will Transfer Sit To/From Stand ?Outcome: Progressing ?Flowsheets (Taken 12/12/2021 1031) ?Patient will transfer sit to/from stand: with min guard assist ?Goal: Pt Will Transfer Bed To Chair/Chair To Bed ?Outcome: Progressing ?Flowsheets (Taken 12/12/2021 1031) ?Pt will Transfer Bed to Chair/Chair to  Bed: min guard assist ?Goal: Pt Will Ambulate ?Outcome: Progressing ?Flowsheets (Taken 12/12/2021 1031) ?Pt will Ambulate: ? 25 feet ? with minimal assist ? with rolling walker ?  ?

## 2021-12-12 NOTE — TOC Initial Note (Signed)
Transition of Care (TOC) - Initial/Assessment Note  ? ? ?Patient Details  ?Name: [name] ?MRN: [id number] ?Date of Birth: Jun 25, 1946 ? ?Transition of Care (TOC) CM/SW Contact:    ?Boneta Lucks, RN ?Phone Number: ?12/12/2021, 2:33 PM ? ?Clinical Narrative:    Patient admitted acute kidney injury. PT is recommending SNF. TOC spoke with her son Elta Guadeloupe. Patient lives at [facility]. She only walked 2 feets, uses a walker, but can not go back until stronger. He is agreeable to SNF. FL2 complete and sent out. [facility] is [facility], then [facility]. She has been vaccinated for COVID. Her son ask about ALF. TOC given him names of ALF's in the area, he will work on possible ALF for after SNF placement.  Patient will not be medically ready until next week. Needs INS AUTH.            ? ? ?Expected Discharge Plan: [place] ?Barriers to Discharge: Continued Medical Work up ? ?Patient Goals and CMS Choice ?Patient states their goals for this hospitalization and ongoing recovery are:: to get better ?CMS Medicare.gov Compare Post Acute Care list provided to:: Patient Represenative (must comment) ?Choice offered to / list presented to : Adult Children ? ?Expected Discharge Plan and Services ?Expected Discharge Plan: [place] ?  ?  ?  ?Living arrangements for the past 2 months: Apartment (Retirement home) ?      ?Prior Living Arrangements/Services ?Living arrangements for the past 2 months: Apartment (Retirement home) ?Lives with:: Self ?Patient language and need for interpreter reviewed:: Yes ?       ?Need for Family Participation in Patient Care: Yes (Comment) ?Care giver support system in place?: Yes (comment) ?Current home services: DME ?Criminal Activity/Legal Involvement Pertinent to Current Situation/Hospitalization: No - Comment as needed ? ?Activities of Daily Living ?Home Assistive Devices/Equipment: None ?ADL Screening (condition at time of admission) ?Patient's cognitive ability  adequate to safely complete daily activities?: Yes ?Is the patient deaf or have difficulty hearing?: Yes ?Does the patient have difficulty seeing, even when wearing glasses/contacts?: No ?Does the patient have difficulty concentrating, remembering, or making decisions?: No ?Patient able to express need for assistance with ADLs?: Yes ?Does the patient have difficulty dressing or bathing?: No ?Independently performs ADLs?: Yes (appropriate for developmental age) ?Does the patient have difficulty walking or climbing stairs?: No ?Weakness of Legs: Both ?Weakness of Arms/Hands: None ? ?Permission Sought/Granted ?  ?  Share Information with NAME: Elta Guadeloupe ?   ? Permission granted to share info w Relationship: Son ?   ?Emotional Assessment ?  ?  ?Affect (typically observed): Accepting ?Orientation: : Oriented to Self, Oriented to Place, Oriented to  Time, Oriented to Situation ?Alcohol / Substance Use: Not Applicable ?Psych Involvement: No (comment) ? ?Admission diagnosis:  Elevated LFTs [R79.89] ?AKI (acute kidney injury) ([place]) [N17.9] ?Facial contusion, initial encounter [S00.83XA] ?Subconjunctival hemorrhage of right eye [H11.31] ?Acute on chronic congestive heart failure, unspecified heart failure type ([place]) [I50.9] ?Patient Active Problem List  ? Diagnosis Date Noted  ? AKI (acute kidney injury) ([place]) 12/11/2021  ? Fall at home, initial encounter 12/11/2021  ? Seizure disorder ([place]) 12/11/2021  ? Atrial fibrillation, chronic ([place]) 12/11/2021  ? Elevated LFTs 12/11/2021  ? Cirrhosis ([place]) 12/11/2021  ? DNR (do not resuscitate) 12/11/2021  ? Congestive heart failure (CHF) ([place]) 12/11/2021  ? Morbid obesity ([place]) 01/31/2018  ? Syncope 07/28/2017  ? Chronic kidney disease, stage III (moderate) ([place]) 07/28/2017  ? COPD (chronic obstructive pulmonary disease) (  [place]) 07/28/2017  ? Pulmonary hypertension ([place]) 07/28/2017  ? Chronic diastolic heart failure, NYHA class 1 ([place]) 07/28/2017  ? Coronary artery disease 07/28/2017  ?  Mixed hyperlipidemia 07/28/2017  ? Hypothyroid 07/28/2017  ? History of PVT (paroxysmal ventricular tachycardia) ([place]) 07/28/2017  ? Symptomatic bradycardia 07/28/2017  ? Restless leg syndrome 07/28/2017  ? Acute lower UTI 04/15/2017  ? Syncope and collapse 04/15/2017  ? CKD (chronic kidney disease) stage 3, GFR 30-59 ml/min (HCC) 04/15/2017  ? CAD (coronary artery disease) S/P CABG 04/14/2017  ? Sinus node dysfunction ([place]) 08/21/2013  ? Atrial tachycardia ([place]) 08/21/2013  ? Pacemaker -[employer] 08/21/2013  ? Coronary atherosclerosis 11/21/2009  ? GASTRIC ULCER 01/21/2009  ? DIARRHEA, CHRONIC 12/31/2008  ? IRON DEFICIENCY 11/28/2008  ? LEG EDEMA 11/19/2008  ? Chronic respiratory failure with hypoxia ([place]) 11/19/2008  ? Angina pectoris ([place]) 11/19/2008  ? HEADACHE 08/19/2008  ? MACULAR DEGENERATION 02/20/2008  ? DISORDER, TOBACCO USE 02/09/2008  ? CAROTID ARTERY DISEASE 02/13/2007  ? TRANSIENT ISCHEMIC ATTACK 02/13/2007  ? INSOMNIA 02/13/2007  ? Restless legs syndrome (RLS) 01/27/2007  ? OSTEOPENIA 01/13/2007  ? ALKALINE PHOSPHATASE, ELEVATED 11/11/2006  ? HEMORRHAGE, CONJUNCTIVAL 11/04/2006  ? MALAISE AND FATIGUE 11/04/2006  ? CERVICAL CANCER 10/11/2006  ? Hyperlipidemia 10/11/2006  ? Anxiety 10/11/2006  ? Depression 10/11/2006  ? Essential hypertension 10/11/2006  ? MYOCARDIAL INFARCTION, HX OF 10/11/2006  ? GERD 10/11/2006  ? OSTEOARTHRITIS 10/11/2006  ? LOW BACK PAIN 10/11/2006  ? ?PCP:  [facility] ?Pharmacy:   ?[id number] [place] [place] - [id number]?Phone: [phone]: [phone]? ? ?Readmission Risk Interventions ?No flowsheet data found. ? ? ?

## 2021-12-12 NOTE — Progress Notes (Signed)
?Progress Note ? ? ?Patient: [name] SJG:[id number] DOB: Jun 06, 1946 DOA: 12/11/2021     1 ?DOS: the patient was seen and examined on 12/12/2021 ?  ?Brief hospital course: ?76 y/o female admitted to the hospital after having a fall. She reported slipping and falling when she was reaching for her walker.  She felt as though she may have been somewhat sedated from pain medication she received earlier in the day.  Patient is chronically on Eliquis for atrial fibrillation.  Upon arrival to the emergency room, CT imaging did not indicate any fractures or intracranial hemorrhage.  She did have significant ecchymosis around her right orbit, right frontal scalp hematoma as well as some right conjunctival hemorrhage.  Vision remained intact.  Her work-up showed acute kidney injury, elevated LFTs, elevated BNP and cardiomegaly.  Echocardiogram showed preserved ejection fraction with severely reduced RV function.  Blood pressures remain 90s to 100s.  She was also noted to have urinary tract infection.  Echocardiogram and cardiac status reviewed with Dr. Haroldine Laws who agreed that patient would benefit from transfer to [facility] for advanced heart failure evaluation.  May need right heart cath.  Recommended starting on milrinone for now. ? ?Assessment and Plan: ?Congestive heart failure (CHF) ([place]) ?Acute right sided heart failure ?-Echo with EF 65-46%, RV systolic function severely reduced. Normal PA pressure. ?-She has elevated BNP of 1000, elevated creatinine, elevated LFTs ?-Blood pressure low since admission, holding metoprolol ?-lactate 1.9 on admission ?-discussed with Dr. Haroldine Laws who felt that patient would benefit from transfer to [facility] for evaluation by advanced heart failure team ?-anticipate that she will need RHC ?-recommended to start on milrinone for now ?-VQ scan has been ordered to rule out PE, although she was taking eliquis prior to admission ?-would defer further diuretic dosing to cardiology ? ?AKI  (acute kidney injury) ([place]) ?Creatinine of 2.1 on admission ?Baseline creatinine of 1.0, 2 weeks ago ?No hydronephrosis on imaging ?Likely related to low cardiac output ?Continue to follow on milrinone/diuresis ? ?Atrial fibrillation, chronic ([place]) ?Heart rate is currently stable ?Chronically on Eliquis  ?holding anticoagulation in light of recent fall and head injury ?Metoprolol held on admission due to low blood pressures ? ?Cirrhosis ([place]) ?New finding of possible nodularity of liver on CT imaging ?We will check right upper quadrant ultrasound for further evaluation ?May possibly be related to right-sided heart failure ? ?Elevated LFTs ?Possibly related to right sided heart failure ?Viral hepatitis panel negative ?Continue to monitor  ? ?COPD (chronic obstructive pulmonary disease) ([place]) ?No shortness of breath or wheezing at this time ?Continue home regimen ? ?Acute lower UTI ?Urine culture positive for klebsiella ?Started on ceftriaxone ?Follow-up culture sensitivities ? ?Chronic kidney disease, stage III (moderate) (HCC) ?Chronic kidney disease stage IIIb ?Baseline creatinine 1.0 ?Continue to monitor ? ?CAD (coronary artery disease) S/P CABG ?No complaints of chest pain ?Continue statin ?Beta-blocker, Imdur had been initially held due to soft blood pressures ? ?Essential hypertension ?Holding metoprolol due to soft blood pressures ? ?Hyperlipidemia ?Continue statin ? ?Hypothyroid ?Continue Synthroid ? ?Seizure disorder ([place]) ?Continue on Trileptal ? ?Sinus node dysfunction (HCC) ?Status post permanent pacemaker ? ?Pacemaker -[employer] ?Pacemaker insertion for sick sinus syndrome ? ?DNR (do not resuscitate) ?Confirmed with patient ? ?Fall at home, initial encounter ?Patient reports that she lost her balance while walking in the middle of the night.  Denies any dizziness or loss of consciousness.  She felt "loopy" due to her medications ?CT imaging did not indicate any fractures  or intracranial injuries.   She is noted to have scalp hematoma as well as some conjunctival hemorrhage in her right eye ? ?Seen by physical therapy with recommendations for skilled nursing facility placement ? ? ? ? ?  ? ?Subjective: Says she does not feel well.  No chest pain.  No wheezing.  No cough. ? ?Physical Exam: ?Vitals:  ? 12/12/21 0259 12/12/21 1046 12/12/21 1310 12/12/21 1657  ?BP: 109/81  110/69 (!) 117/55  ?Pulse: (!) 59  (!) 59 (!) 59  ?Resp: '18  18 17  '$ ?Temp: 98.6 ?F (37 ?C)  98 ?F (36.7 ?C) 97.6 ?F (36.4 ?C)  ?TempSrc:      ?SpO2: 98% 96% 100% 100%  ?Weight:      ?Height:      ? ?General exam: Alert, awake, oriented x 3 ?Respiratory system: Clear to auscultation. Respiratory effort normal. ?Cardiovascular system:RRR. No murmurs, rubs, gallops. ?Gastrointestinal system: Abdomen is nondistended, soft and nontender. No organomegaly or masses felt. Normal bowel sounds heard. ?Central nervous system: Alert and oriented. No focal neurological deficits. ?Extremities: Lower extremity edema present ?Skin: Bruising noted around right orbit, right conjunctival hemorrhage ?Psychiatry: Judgement and insight appear normal. Mood & affect appropriate.  ? ?Data Reviewed: ? ?Reviewed echocardiogram, chemistry and CBC ? ?Family Communication: Updated patient's son at the bedside ? ?Disposition: ?Status is: Inpatient ?Remains inpatient appropriate because: Transfer to [facility] for further cardiac evaluation ? Planned Discharge Destination: Skilled nursing facility ? ? ? ?Time spent: 45 minutes ? ?Author: ?Kathie Dike, MD ?12/12/2021 7:11 PM ? ?For on call review [email or web address].  ?

## 2021-12-12 NOTE — Progress Notes (Signed)
1925:  Pt transferred to ICU bed 6 at this time.  Report received from nurse on 300.   ?1945:  Pt taken to [facility] at this time for scan to rule out PE.   ?2005:  Pt returned to ICU room 6 at this time.   ?2015:  Called son to inform him of his mothers pending transfer to [facility].  He was aware that pt was going.  He was informed by this nurse what room and unit he was going to.  ?2030:  Pt assessed at this time.   ?2100:  Report called to 4E nurse.  ?2115:  Report given to carelink at this time.  ?2215:  Pt transferred to [facility] at this time via stretcher with carelink. Pt belongings sent.  Pt cell phone, charger and clothes sent with pt.  ? ?

## 2021-12-12 NOTE — Evaluation (Signed)
Physical Therapy Evaluation ?Patient Details ?Name: [name] ?MRN: [id number] ?DOB: 07/14/1946 ?Today's Date: 12/12/2021 ? ?History of Present Illness ? [name] is a 76 y.o. female with medical history significant of chronic kidney disease stage IIIa, COPD, atrial fibrillation on anticoagulation, sick sinus syndrome status post pacemaker, restless leg syndrome, had been evaluated at [facility] on 3/15 for pain in her lower extremities.  Labs were checked and she was noted to have elevated creatinine above her baseline.  She received a liter of IV fluids was discharged home with recommendations to follow-up with her physicians.  She did receive some extra gabapentin for her pain and her legs.  Upon returning home, she reports feeling "loopy" and reports stumbling while she was trying to reach for her walker.  She did have a fall.  Denies any loss of consciousness.  She has not had any recent vomiting, diarrhea, cough, fever. ? ?  ?Clinical Impression ? Patient is asleep on therapist arrival.  She is agreeable to physical therapy.  Patient needs min A and max verbal encouragement to sit up on EOB and to fully come to EOB.  Patient tends to lean back and needs min A to maintain sitting balance.  Patient performs sit to stand from EOB to RW with min A and needs min A for steering and balance and max verbal cues to take several steps with RW to chair.  Patient left sitting up in chair with chair alarm set and feet elevated.  Nursing notified of above. Patient will benefit from continued skilled therapy services to address functional deficits for the remaining stay here at the hospital.  ?   ? ?Recommendations for follow up therapy are one component of a multi-disciplinary discharge planning process, led by the attending physician.  Recommendations may be updated based on patient status, additional functional criteria and insurance authorization. ? ?Follow Up Recommendations Skilled nursing-short term  rehab (<3 hours/day) ? ?  ?Assistance Recommended at Discharge Frequent or constant Supervision/Assistance  ?Patient can return home with the following ? A lot of help with bathing/dressing/bathroom;A lot of help with walking and/or transfers;Help with stairs or ramp for entrance ? ?  ?Equipment Recommendations None recommended by PT  ?Recommendations for Other Services ?    ?  ?Functional Status Assessment Patient has had a recent decline in their functional status and demonstrates the ability to make significant improvements in function in a reasonable and predictable amount of time.  ? ?  ?Precautions / Restrictions Precautions ?Precautions: Fall ?Precaution Comments: appears mildly confused ?Restrictions ?Weight Bearing Restrictions: No  ? ?  ? ?Mobility ? Bed Mobility ?Overal bed mobility: Needs Assistance ?Bed Mobility: Supine to Sit ?  ?  ?Supine to sit: Min assist ?  ?  ?General bed mobility comments: needs min A and verbal and tactile encouragement to come fully to EOB ?  ? ?Transfers ?Overall transfer level: Needs assistance ?Equipment used: Rolling walker (2 wheels) ?Transfers: Bed to chair/wheelchair/BSC, Sit to/from Stand ?Sit to Stand: Min assist ?  ?Step pivot transfers: Min assist ?  ?  ?  ?General transfer comment: needs min A for balance and max verbal cues for navigation ?  ? ?Ambulation/Gait ?Ambulation/Gait assistance: Min assist ?Gait Distance (Feet): 2 Feet ?Assistive device: Rolling walker (2 wheels) ?Gait Pattern/deviations: Decreased weight shift to right, Decreased weight shift to left, Staggering left ?Gait velocity: decreased ?  ?  ?General Gait Details: takes wobbly steps to transfer from bed to chair with RW and  min A for balance and turning walker ? ?Stairs ?  ?  ?  ?  ?  ? ?Wheelchair Mobility ?  ? ?Modified Rankin (Stroke Patients Only) ?  ? ?  ? ?Balance Overall balance assessment: Needs assistance ?Sitting-balance support: Bilateral upper extremity supported, Feet  supported ?Sitting balance-Leahy Scale: Fair ?Sitting balance - Comments: fair sitting balance with therapist min A on EOB with feet on floor and hands on mattress ?Postural control: Posterior lean ?Standing balance support: Bilateral upper extremity supported, During functional activity, Reliant on assistive device for balance ?Standing balance-Leahy Scale: Fair ?Standing balance comment: Fair standing balance with RW and therapist min A ?  ?  ?  ?  ?  ?  ?  ?  ?  ?  ?  ?   ? ? ? ?Pertinent Vitals/Pain Pain Assessment ?Pain Assessment: 0-10 ?Pain Score: 0-No pain ?Pain Location: denies pain but has significant bruising R side of her face  ? ? ?Home Living Family/patient expects to be discharged to:: Assisted living ?  ?  ?  ?  ?  ?  ?  ?  ?Home Equipment: Rolling Walker (2 wheels);Grab bars - toilet;Grab bars - tub/shower ?Additional Comments: states she lives at [facility] in [place]; retirement home  ?  ?Prior Function Prior Level of Function : Needs assist;History of Falls (last six months) ?  ?  ?  ?  ?  ?  ?  ?  ?  ? ? ?Hand Dominance  ? Dominant Hand: Right ? ?  ?Extremity/Trunk Assessment  ? Upper Extremity Assessment ?Upper Extremity Assessment: Generalized weakness ?  ? ?Lower Extremity Assessment ?Lower Extremity Assessment: Generalized weakness ?  ? ?   ?Communication  ? Communication: HOH  ?Cognition Arousal/Alertness: Lethargic, Awake/alert ?  ?Overall Cognitive Status: Within Functional Limits for tasks assessed ?Area of Impairment: Safety/judgement ?  ?  ?  ?  ?  ?  ?  ?  ?  ?  ?  ?  ?Safety/Judgement: Decreased awareness of safety ?  ?  ?General Comments: appears slightly confused but able to follow commands ?  ?  ? ?  ?General Comments General comments (skin integrity, edema, etc.): bruising Right side of face; redness in legs noted ? ?  ?Exercises    ? ?Assessment/Plan  ?  ?PT Assessment Patient needs continued PT services  ?PT Problem List Decreased strength;Decreased mobility;Decreased safety  awareness;Decreased coordination;Decreased activity tolerance;Decreased cognition;Decreased balance;Decreased knowledge of use of DME ? ?   ?  ?PT Treatment Interventions DME instruction;Therapeutic activities;Gait training;Therapeutic exercise;Patient/family education;Balance training;Functional mobility training;Neuromuscular re-education   ? ?PT Goals (Current goals can be found in the Care Plan section)  ?Acute Rehab PT Goals ?Patient Stated Goal: to return to retirement home ?PT Goal Formulation: With patient ?Time For Goal Achievement: 12/26/21 ?Potential to Achieve Goals: Good ? ?  ?Frequency Min 2X/week ?  ? ? ?Co-evaluation   ?  ?  ?  ?  ? ? ?  ?AM-PAC PT "6 Clicks" Mobility  ?Outcome Measure Help needed turning from your back to your side while in a flat bed without using bedrails?: A Little ?Help needed moving from lying on your back to sitting on the side of a flat bed without using bedrails?: A Little ?Help needed moving to and from a bed to a chair (including a wheelchair)?: A Lot ?Help needed standing up from a chair using your arms (e.g., wheelchair or bedside chair)?: A Lot ?Help needed to walk in hospital room?: A  Lot ?Help needed climbing 3-5 steps with a railing? : A Lot ?6 Click Score: 14 ? ?  ?End of Session Equipment Utilized During Treatment: Oxygen ?Activity Tolerance: Patient tolerated treatment well ?Patient left: with chair alarm set;in chair;with call bell/phone within reach ?Nurse Communication: Mobility status ?PT Visit Diagnosis: Unsteadiness on feet (R26.81);Other abnormalities of gait and mobility (R26.89);Muscle weakness (generalized) (M62.81);History of falling (Z91.81) ?  ? ?Time: 6468-0321 ?PT Time Calculation (min) (ACUTE ONLY): 20 min ? ? ?Charges:   PT Evaluation ?$PT Eval Low Complexity: 1 Low ?PT Treatments ?$Gait Training: 8-22 mins ?  ?   ? ? ?10:31 AM, 12/12/21 ?Samaad Hashem Small Lucas Winograd MPT ?[facility] ?[place] [phone] ?Ph:[phone] ? ?

## 2021-12-12 NOTE — [place] FL2 (Signed)
?[place] MEDICAID FL2 LEVEL OF CARE SCREENING TOOL  ?  ? ?IDENTIFICATION  ?Patient Name: ?[name] Birthdate: 1946/07/25 Sex: female Admission Date (Current Location): ?12/11/2021  ?[place] and [place] Number: ? [place] and Address:  ?[facility] [address], [place] ?     Provider Number: ?[id number]  ?Attending Physician Name and Address:  ?Kathie Dike, MD ? Relative Name and Phone Number:  ?Gaetana Kawahara, [facility])  [phone] ?   ?Current Level of Care: ?Hospital Recommended Level of Care: ?[place] Prior Approval Number: ?  ? ?Date Approved/Denied: ?  PASRR Number: ?[id number] A ? ?Discharge Plan: ?SNF ?  ? ?Current Diagnoses: ?Patient Active Problem List  ? Diagnosis Date Noted  ? AKI (acute kidney injury) ([place]) 12/11/2021  ? Fall at home, initial encounter 12/11/2021  ? Seizure disorder ([place]) 12/11/2021  ? Atrial fibrillation, chronic ([place]) 12/11/2021  ? Elevated LFTs 12/11/2021  ? Cirrhosis ([employer]) 12/11/2021  ? DNR (do not resuscitate) 12/11/2021  ? Congestive heart failure (CHF) ([place]) 12/11/2021  ? Morbid obesity ([place]) 01/31/2018  ? Syncope 07/28/2017  ? Chronic kidney disease, stage III (moderate) ([place]) 07/28/2017  ? COPD (chronic obstructive pulmonary disease) ([place]) 07/28/2017  ? Pulmonary hypertension ([place]) 07/28/2017  ? Chronic diastolic heart failure, NYHA class 1 ([place]) 07/28/2017  ? Coronary artery disease 07/28/2017  ? Mixed hyperlipidemia 07/28/2017  ? Hypothyroid 07/28/2017  ? History of PVT (paroxysmal ventricular tachycardia) ([place]) 07/28/2017  ? Symptomatic bradycardia 07/28/2017  ? Restless leg syndrome 07/28/2017  ? Acute lower UTI 04/15/2017  ? Syncope and collapse 04/15/2017  ? CKD (chronic kidney disease) stage 3, GFR 30-59 ml/min (HCC) 04/15/2017  ? CAD (coronary artery disease) S/P CABG 04/14/2017  ? Sinus node dysfunction ([place]) 08/21/2013  ? Atrial tachycardia ([place]) 08/21/2013  ? Pacemaker -[employer] 08/21/2013  ?  Coronary atherosclerosis 11/21/2009  ? GASTRIC ULCER 01/21/2009  ? DIARRHEA, CHRONIC 12/31/2008  ? IRON DEFICIENCY 11/28/2008  ? LEG EDEMA 11/19/2008  ? Chronic respiratory failure with hypoxia ([place]) 11/19/2008  ? Angina pectoris ([place]) 11/19/2008  ? HEADACHE 08/19/2008  ? MACULAR DEGENERATION 02/20/2008  ? DISORDER, TOBACCO USE 02/09/2008  ? CAROTID ARTERY DISEASE 02/13/2007  ? TRANSIENT ISCHEMIC ATTACK 02/13/2007  ? INSOMNIA 02/13/2007  ? Restless legs syndrome (RLS) 01/27/2007  ? OSTEOPENIA 01/13/2007  ? ALKALINE PHOSPHATASE, ELEVATED 11/11/2006  ? HEMORRHAGE, CONJUNCTIVAL 11/04/2006  ? MALAISE AND FATIGUE 11/04/2006  ? CERVICAL CANCER 10/11/2006  ? Hyperlipidemia 10/11/2006  ? Anxiety 10/11/2006  ? Depression 10/11/2006  ? Essential hypertension 10/11/2006  ? MYOCARDIAL INFARCTION, HX OF 10/11/2006  ? GERD 10/11/2006  ? OSTEOARTHRITIS 10/11/2006  ? LOW BACK PAIN 10/11/2006  ? ? ?Orientation RESPIRATION BLADDER Height & Weight   ?  ?Self, Time, Situation, Place ? O2 (2L) Continent Weight: 91.8 kg ?Height:  '5\' 2"'$  (157.5 cm)  ?BEHAVIORAL SYMPTOMS/MOOD NEUROLOGICAL BOWEL NUTRITION STATUS  ?    Continent Diet (See DC  Summary)  ?AMBULATORY STATUS COMMUNICATION OF NEEDS Skin   ?Extensive Assist Verbally Bruising ?  ?  ?  ?    ?     ?     ? ? ?Personal Care Assistance Level of Assistance  ?Bathing, Feeding, Dressing Bathing Assistance: Maximum assistance ?Feeding assistance: Independent ?Dressing Assistance: Maximum assistance ?   ? ?Functional Limitations Info  ?Sight, Hearing, Speech Sight Info: Adequate ?Hearing Info: Adequate ?Speech Info: Adequate  ? ? ?SPECIAL CARE FACTORS FREQUENCY  ?PT (By licensed PT)   ?  ?  PT Frequency: 5 times a week ?  ?  ?  ?  ?   ? ? ?Contractures Contractures Info: Not present  ? ? ?Additional Factors Info  ?Code Status, Allergies Code Status Info: DNR ?Allergies Info: Iohexol, codeine ?  ?  ?  ?   ? ?Current Medications (12/12/2021):  This is the current hospital active medication  list ?Current Facility-Administered Medications  ?Medication Dose Route Frequency Provider Last Rate Last Admin  ? acetaminophen (TYLENOL) tablet 650 mg  650 mg Oral Q6H PRN Kathie Dike, MD   650 mg at 12/11/21 1919  ? Or  ? acetaminophen (TYLENOL) suppository 650 mg  650 mg Rectal Q6H PRN Kathie Dike, MD      ? cefTRIAXone (ROCEPHIN) 1 g in sodium chloride 0.9 % 100 mL IVPB  1 g Intravenous Q24H Kathie Dike, MD 200 mL/hr at 12/12/21 1338 1 g at 12/12/21 1338  ? diclofenac Sodium (VOLTAREN) 1 % topical gel 4 g  4 g Topical QID Kathie Dike, MD   4 g at 12/12/21 1339  ? fluticasone furoate-vilanterol (BREO ELLIPTA) 100-25 MCG/ACT 1 puff  1 puff Inhalation Daily Kathie Dike, MD   1 puff at 12/12/21 1046  ? And  ? umeclidinium bromide (INCRUSE ELLIPTA) 62.5 MCG/ACT 1 puff  1 puff Inhalation Daily Kathie Dike, MD   1 puff at 12/12/21 1046  ? lactulose (CHRONULAC) 10 GM/15ML solution 20 g  20 g Oral BID Kathie Dike, MD   20 g at 12/12/21 1332  ? levothyroxine (SYNTHROID) tablet 75 mcg  75 mcg Oral Q0600 Kathie Dike, MD   75 mcg at 12/12/21 4081  ? ondansetron (ZOFRAN) tablet 4 mg  4 mg Oral Q6H PRN Kathie Dike, MD      ? Or  ? ondansetron (ZOFRAN) injection 4 mg  4 mg Intravenous Q6H PRN Kathie Dike, MD      ? Oxcarbazepine (TRILEPTAL) tablet 300 mg  300 mg Oral BID Kathie Dike, MD   300 mg at 12/12/21 1015  ? pantoprazole (PROTONIX) EC tablet 40 mg  40 mg Oral QHS Kathie Dike, MD   40 mg at 12/11/21 2251  ? pramipexole (MIRAPEX) tablet 1 mg  1 mg Oral QHS Kathie Dike, MD   1 mg at 12/11/21 2251  ? sertraline (ZOLOFT) tablet 100 mg  100 mg Oral Daily Kathie Dike, MD   100 mg at 12/12/21 1015  ? ? ? ?Discharge Medications: ?Please see discharge summary for a list of discharge medications. ? ?Relevant Imaging Results: ? ?Relevant Lab Results: ? ? ?Additional Information ?SS# [id number] ? ?Boneta Lucks, RN ? ? ? ? ?

## 2021-12-13 ENCOUNTER — Encounter (HOSPITAL_COMMUNITY): Payer: Self-pay | Admitting: Internal Medicine

## 2021-12-13 DIAGNOSIS — R7989 Other specified abnormal findings of blood chemistry: Secondary | ICD-10-CM

## 2021-12-13 DIAGNOSIS — K7460 Unspecified cirrhosis of liver: Secondary | ICD-10-CM | POA: Diagnosis not present

## 2021-12-13 DIAGNOSIS — I50811 Acute right heart failure: Secondary | ICD-10-CM | POA: Diagnosis not present

## 2021-12-13 DIAGNOSIS — I482 Chronic atrial fibrillation, unspecified: Secondary | ICD-10-CM | POA: Diagnosis not present

## 2021-12-13 DIAGNOSIS — N179 Acute kidney failure, unspecified: Secondary | ICD-10-CM | POA: Diagnosis not present

## 2021-12-13 DIAGNOSIS — I25758 Atherosclerosis of native coronary artery of transplanted heart with other forms of angina pectoris: Secondary | ICD-10-CM

## 2021-12-13 DIAGNOSIS — I361 Nonrheumatic tricuspid (valve) insufficiency: Secondary | ICD-10-CM

## 2021-12-13 LAB — COMPREHENSIVE METABOLIC PANEL
ALT: 124 U/L — ABNORMAL HIGH (ref 0–44)
AST: 66 U/L — ABNORMAL HIGH (ref 15–41)
Albumin: 3.3 g/dL — ABNORMAL LOW (ref 3.5–5.0)
Alkaline Phosphatase: 265 U/L — ABNORMAL HIGH (ref 38–126)
Anion gap: 12 (ref 5–15)
BUN: 67 mg/dL — ABNORMAL HIGH (ref 8–23)
CO2: 22 mmol/L (ref 22–32)
Calcium: 8.6 mg/dL — ABNORMAL LOW (ref 8.9–10.3)
Chloride: 102 mmol/L (ref 98–111)
Creatinine, Ser: 2.11 mg/dL — ABNORMAL HIGH (ref 0.44–1.00)
GFR, Estimated: 24 mL/min — ABNORMAL LOW (ref >60)
Glucose, Bld: 120 mg/dL — ABNORMAL HIGH (ref 70–99)
Potassium: 3.2 mmol/L — ABNORMAL LOW (ref 3.5–5.1)
Sodium: 136 mmol/L (ref 135–145)
Total Bilirubin: 1 mg/dL (ref 0.3–1.2)
Total Protein: 6 g/dL — ABNORMAL LOW (ref 6.5–8.1)

## 2021-12-13 LAB — CBC
HCT: 32.7 % — ABNORMAL LOW (ref 36.0–46.0)
Hemoglobin: 10.2 g/dL — ABNORMAL LOW (ref 12.0–15.0)
MCH: 26 pg (ref 26.0–34.0)
MCHC: 31.2 g/dL (ref 30.0–36.0)
MCV: 83.2 fL (ref 80.0–100.0)
Platelets: 189 10*3/uL (ref 150–400)
RBC: 3.93 MIL/uL (ref 3.87–5.11)
RDW: 19.8 % — ABNORMAL HIGH (ref 11.5–15.5)
WBC: 6.9 10*3/uL (ref 4.0–10.5)
nRBC: 0.4 % — ABNORMAL HIGH (ref 0.0–0.2)

## 2021-12-13 LAB — URINE CULTURE: Culture: >=100000 — AB

## 2021-12-13 MED ORDER — SPIRONOLACTONE 25 MG PO TABS
25.0000 mg | ORAL_TABLET | Freq: Every day | ORAL | Status: DC
  Administered 2021-12-13 – 2021-12-18 (×6): 25 mg via ORAL
  Filled 2021-12-13 (×6): qty 1

## 2021-12-13 MED ORDER — ACETAMINOPHEN 325 MG PO TABS
325.0000 mg | ORAL_TABLET | Freq: Once | ORAL | Status: AC
  Administered 2021-12-13: 325 mg via ORAL
  Filled 2021-12-13: qty 1

## 2021-12-13 MED ORDER — DEXTROSE 5 % IV SOLN
8.0000 mg/h | INTRAVENOUS | Status: DC
  Administered 2021-12-13 – 2021-12-14 (×3): 15 mg/h via INTRAVENOUS
  Filled 2021-12-13 (×6): qty 20

## 2021-12-13 MED ORDER — POTASSIUM CHLORIDE CRYS ER 20 MEQ PO TBCR
40.0000 meq | EXTENDED_RELEASE_TABLET | Freq: Once | ORAL | Status: AC
  Administered 2021-12-13: 40 meq via ORAL
  Filled 2021-12-13: qty 2

## 2021-12-13 MED ORDER — POLYVINYL ALCOHOL 1.4 % OP SOLN
1.0000 [drp] | OPHTHALMIC | Status: DC | PRN
  Administered 2021-12-13: 1 [drp] via OPHTHALMIC
  Filled 2021-12-13: qty 15

## 2021-12-13 NOTE — Evaluation (Signed)
Occupational Therapy Evaluation Patient Details Name: [name] MRN: [id number] DOB: [birth year] Today's Date: 12/13/2021   History of Present Illness [name] is a 76 y.o. female with medical history significant of chronic kidney disease stage IIIa, COPD, atrial fibrillation on anticoagulation, sick sinus syndrome status post pacemaker, restless leg syndrome, had been evaluated at [facility] on 3/15 for pain in her lower extremities.  Labs were checked and she was noted to have elevated creatinine above her baseline.  She received a liter of IV fluids was discharged home with recommendations to follow-up with her physicians.  She did receive some extra gabapentin for her pain and her legs.  Upon returning home, she reports feeling "loopy" and reports stumbling while she was trying to reach for her walker.  She did have a fall.  Denies any loss of consciousness.  She has not had any recent vomiting, diarrhea, cough, fever.   Clinical Impression   Patient is currently requiring assistance with ADLs including maximum to total assist with Lower body ADLs, minimal assist with Upper body ADLs,  as well as  moderate assist with bed mobility and minimal assist with functional transfers to toilet using RW.  Current level of function may be below patient's typical baseline which is difficult to clearly identify due to mild pt confusion, HOH and pt with limited stamina for answering multiple questions.  During this evaluation, patient was limited by generalized weakness, impaired activity tolerance, and head pain, all of which has the potential to impact patient's safety and independence during functional mobility, as well as performance for ADLs.  Patient lives at a "Retirement Home" in [place] and endorses 24/7 supervision and assistance.  Patient demonstrates good rehab potential, and should benefit from continued skilled occupational therapy services while in acute care to maximize safety,  independence and quality of life at home.  Continued occupational therapy services in a SNF setting prior to return home is recommended.  ?    Recommendations for follow up therapy are one component of a multi-disciplinary discharge planning process, led by the attending physician.  Recommendations may be updated based on patient status, additional functional criteria and insurance authorization.   Follow Up Recommendations  Skilled nursing-short term rehab (<3 hours/day)    Assistance Recommended at Discharge Frequent or constant Supervision/Assistance  Patient can return home with the following A little help with walking and/or transfers;A lot of help with bathing/dressing/bathroom;Direct supervision/assist for medications management    Functional Status Assessment  Patient has had a recent decline in their functional status and demonstrates the ability to make significant improvements in function in a reasonable and predictable amount of time.  Equipment Recommendations       Recommendations for Other Services       Precautions / Restrictions Precautions Precautions: Fall Precaution Comments: appears mildly confused Restrictions Weight Bearing Restrictions: No      Mobility Bed Mobility Overal bed mobility: Needs Assistance Bed Mobility: Supine to Sit     Supine to sit: Mod assist     General bed mobility comments: needs Moderate A to raise trunk, and verbal and tactile encouragement to come fully to EOB. Increased time.    Transfers Overall transfer level:  (see ADL)                        Balance Overall balance assessment: History of Falls, Needs assistance Sitting-balance support: Bilateral upper extremity supported, Feet supported Sitting balance-Leahy Scale: Fair   Postural  control: Posterior lean Standing balance support: Bilateral upper extremity supported, During functional activity, Reliant on assistive device for balance Standing  balance-Leahy Scale: Poor Standing balance comment: Poor today with need of BUE support on RW and external assist for safety.                           ADL either performed or assessed with clinical judgement   ADL Overall ADL's : Needs assistance/impaired Eating/Feeding: Minimal assistance;Set up Eating/Feeding Details (indicate cue type and reason): full setup and Min As for cutting food. Grooming: Wash/dry hands;Sitting;Set up Grooming Details (indicate cue type and reason): Cues to initiate with warm washcloth placed in pt's hands prior to breakfast. Upper Body Bathing: Minimal assistance;Sitting   Lower Body Bathing: Maximal assistance;Sit to/from stand;Sitting/lateral leans   Upper Body Dressing : Minimal assistance;Sitting   Lower Body Dressing: Maximal assistance;Sit to/from stand;Bed level;Total assistance;Cueing for sequencing Lower Body Dressing Details (indicate cue type and reason): Max As to don mesh underwear, beginning in bed then completing with stand. Total Assist while standing. Max As with pt able to perform partial bridge without cues. Toilet Transfer: Minimal assistance;BSC/3in1;Cueing for sequencing;Cueing for safety;Rolling walker (2 wheels) Toilet Transfer Details (indicate cue type and reason): Pt stood from EOB to RW with Min As, performed pivot with RW to recliner with Min As, increased time/effort, step by step cues for safety and hand position prior to sitting. Anterior posture. Toileting- Clothing Manipulation and Hygiene: Maximal assistance Toileting - Clothing Manipulation Details (indicate cue type and reason): Currently using pure wick.     Functional mobility during ADLs: Minimal assistance;Rolling walker (2 wheels);Cueing for sequencing;Cueing for safety       Vision Ability to See in Adequate Light: 2 Moderately impaired Patient Visual Report: No change from baseline Additional Comments: Pt reports baseline "poor" vision      Perception     Praxis      Pertinent Vitals/Pain Pain Assessment Pain Assessment: Faces Faces Pain Scale: Hurts little more Pain Location: Pt reports headache. Pain Descriptors / Indicators: Aching, Moaning, Grimacing Pain Intervention(s): Limited activity within patient's tolerance, Monitored during session, Repositioned     Hand Dominance Right   Extremity/Trunk Assessment Upper Extremity Assessment Upper Extremity Assessment: Generalized weakness   Lower Extremity Assessment Lower Extremity Assessment: Generalized weakness   Cervical / Trunk Assessment Cervical / Trunk Assessment: Kyphotic   Communication Communication Communication: HOH   Cognition Arousal/Alertness: Awake/alert Behavior During Therapy: Anxious, Flat affect Overall Cognitive Status: Within Functional Limits for tasks assessed Area of Impairment: Safety/judgement, Following commands                   Current Attention Level: Selective   Following Commands: Follows one step commands consistently Safety/Judgement: Decreased awareness of safety Awareness: Anticipatory   General Comments: appears slightly confused but able to follow commands. Poor hearing despite RT hearing aid in place.     General Comments       Exercises     Shoulder Instructions      Home Living Family/patient expects to be discharged to:: Assisted living (Pt corrected that she lives in a "retirement home" and denied living in an ALF. Pt reports assistance with all ADLs at baseline.)                             Home Equipment: Rolling Walker (2 wheels);Grab bars - toilet;Grab bars - tub/shower  Additional Comments: states she lives at [facility] in [place]; retirement home      Prior Functioning/Environment Prior Level of Function : Needs assist;History of Falls (last six months)       Physical Assist : ADLs (physical)   ADLs (physical): Dressing;Bathing;Toileting Mobility Comments: Pt reprots one  other fall prior to this one about 6 months ago. Pt reports using her RW at all times. Has not needed a WC yet. ADLs Comments: Pt endorses assistance with all LB ADLs including socks when needed but will often skip and wear slide on shoes. Baseline assistance with bathing, LE dressing and possibly hygiene after toileting. Pt denied bladder incontinence but noted pt was asleep when OT first entered room and was actively voiding into pure wick system.        OT Problem List: Pain;Decreased strength;Increased edema;Decreased activity tolerance;Impaired balance (sitting and/or standing);Decreased knowledge of use of DME or AE;Impaired vision/perception      OT Treatment/Interventions: Self-care/ADL training;Therapeutic exercise;Therapeutic activities;Energy conservation;Visual/perceptual remediation/compensation;Patient/family education;DME and/or AE instruction;Balance training    OT Goals(Current goals can be found in the care plan section) Acute Rehab OT Goals Patient Stated Goal: Pt focused on calling her sons. OT Goal Formulation: Patient unable to participate in goal setting Time For Goal Achievement: 12/27/21 Potential to Achieve Goals: Good ADL Goals Pt Will Perform Grooming: standing;with supervision (at least 1 task) Pt Will Perform Lower Body Bathing: with min assist Pt Will Perform Upper Body Dressing: with set-up Pt Will Transfer to Toilet: with supervision;ambulating Pt Will Perform Toileting - Clothing Manipulation and hygiene: with min guard assist;sitting/lateral leans Additional ADL Goal #1: Pt will progress unsupported sitting balance to good and toelrate 10 min of unsupported sitting during BUE activities in order to demonstrate improved balance and activity tolerance needed to increase participation in self care.  OT Frequency: Min 2X/week    Co-evaluation              AM-PAC OT "6 Clicks" Daily Activity     Outcome Measure Help from another person eating meals?: A  Little Help from another person taking care of personal grooming?: A Little Help from another person toileting, which includes using toliet, bedpan, or urinal?: A Lot Help from another person bathing (including washing, rinsing, drying)?: A Lot Help from another person to put on and taking off regular upper body clothing?: A Little Help from another person to put on and taking off regular lower body clothing?: A Lot 6 Click Score: 15   End of Session Equipment Utilized During Treatment: Gait belt;Rolling walker (2 wheels);Oxygen Nurse Communication: Other (comment) (CNA assisting with supplies to room)  Activity Tolerance: Patient limited by fatigue;Patient limited by pain Patient left: in chair;with call bell/phone within reach  OT Visit Diagnosis: Unsteadiness on feet (R26.81);History of falling (Z91.81);Repeated falls (R29.6);Muscle weakness (generalized) (M62.81);Low vision, both eyes (H54.2);Pain Pain - part of body:  (head)                Time: 0900-0930 OT Time Calculation (min): 30 min Charges:  OT General Charges $OT Visit: 1 Visit OT Evaluation $OT Eval Low Complexity: 1 Low OT Treatments $Self Care/Home Management : 8-22 mins  Victorino Dike, OT Acute Rehab Services Office: [phone] 12/13/2021  Theodoro Clock 12/13/2021, 9:48 AM

## 2021-12-13 NOTE — Progress Notes (Signed)
Pt arrived to the unit, accompanied by [place] staff. Pt is alert and responsive, able to make needs known. Noted bruising to right orbital area with conjunctival redness, hematoma to right frontal area and lower extremities redness and swelling. C/O pain only when moved or with activity. Skin warm and dry, call bell within reach. Continues on Milrinone drip without symptoms. ?

## 2021-12-13 NOTE — Consult Note (Signed)
?  ?Advanced Heart Failure Team Consult Note ? ? ?Primary Physician: [facility] ?PCP-Cardiologist:  None ? ?Reason for Consultation: RV failure/shock ? ?HPI:   ? ?[name] is seen today for evaluation of RV failure and shock at the request of Dr. Roderic Palau.  ? ?[name] is a 76 y.o. 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 Lovena Le 2014), chronic diastolic HF. ? ?Had MI in 2007 with stenting of LAD x 2. In 2008 underwent CABG x 4 LIMA->LAD, VG->OM->dLCX, VG->RPL; ? ?Echo 4/13 EF 55% RV mildly dilated Mild TR RVSP 47 ?Echo 11/18 EF 60-65% Severe TR. RV dilated with moderately reduced RV function. RVSP 41  ? ?Myoview 7/18 EF 61% previous anteroseptal MI. No ischemia ? ?Recently seen at [facility] on 3/15 for pain in her lower extremities.  Labs were checked and she was noted to have elevated creatinine above her baseline.  She received a liter of IV fluids was discharged home with recommendations to follow-up with her physicians. She did receive some extra gabapentin for her pain and her legs.  Upon returning home, she reports feeling "loopy" and reports stumbling while she was trying to reach for her walker and fell and hit her head. ? ?Presented to [facility] ER on 3/17. Large hematoma on forehead. CT negative for bleed. Had progressive AKI and elevated LFTs. Lactate 1.9. Noncontrast CT of abdomen showed evidence of RV failure/cirrhosis with small volume ?ascites, diffuse mesenteric edema and mild body wall edema. ? ?Echo EF 60-65% RV markedly dilated with wide open TR. D-shaped septum. No significant PAH with TR tracing ? ?VQ negative for PE ? ?Started on milrinone and transferred here. Scr 2.3 -> 2.1  ? ?Lives alone at home. Says fluid has been building up for at least 1 month.  ? ? ?Review of Systems: [y] = yes, '[ ]'$  = no  ? ?General: Weight gain '[ ]'$ ; Weight loss '[ ]'$ ;  Anorexia '[ ]'$ ; Fatigue Blue.Reese ]; Fever '[ ]'$ ; Chills '[ ]'$ ; Weakness '[ ]'$   ?Cardiac: Chest pain/pressure '[ ]'$ ; Resting SOB '[ ]'$ ; Exertional SOB [ y]; Orthopnea '[ ]'$ ; Pedal Edema '[ ]'$ ; Palpitations '[ ]'$ ; Syncope '[ ]'$ ; Presyncope '[ ]'$ ; Paroxysmal nocturnal dyspnea'[ ]'$   ?Pulmonary: Cough '[ ]'$ ; Wheezing'[ ]'$ ; Hemoptysis'[ ]'$ ; Sputum '[ ]'$ ; Snoring '[ ]'$   ?GI: Vomiting'[ ]'$ ; Dysphagia'[ ]'$ ; Melena'[ ]'$ ; Hematochezia '[ ]'$ ; Heartburn'[ ]'$ ; Abdominal pain '[ ]'$ ; Constipation '[ ]'$ ; Diarrhea '[ ]'$ ; BRBPR '[ ]'$   ?GU: Hematuria'[ ]'$ ; Dysuria '[ ]'$ ; Nocturia'[ ]'$   ?Vascular: Pain in legs with walking '[ ]'$ ; Pain in feet with lying flat '[ ]'$ ; Non-healing sores '[ ]'$ ; Stroke Blue.Reese ]; TIA '[ ]'$ ; Slurred speech '[ ]'$ ;  ?Neuro: Headaches'[ ]'$ ; Vertigo'[ ]'$ ; Seizures'[ ]'$ ; Paresthesias'[ ]'$ ;Blurred vision '[ ]'$ ; Diplopia '[ ]'$ ; Vision changes '[ ]'$   ?Ortho/Skin: Arthritis [ y]; Joint pain Blue.Reese ]; Muscle pain '[ ]'$ ; Joint swelling '[ ]'$ ; Back Pain '[ ]'$ ; Rash '[ ]'$   ?Psych: Depression'[ ]'$ ; Anxiety'[ ]'$   ?Heme: Bleeding problems '[ ]'$ ; Clotting disorders '[ ]'$ ; Anemia '[ ]'$   ?Endocrine: Diabetes '[ ]'$ ; Thyroid dysfunction'[ ]'$  ? ?Home Medications ?Prior to Admission medications   ?Medication Sig Start Date End Date Taking? Authorizing Provider  ?acetaminophen (TYLENOL) 325 MG tablet Take 650 mg by mouth every 6 (six) hours as needed for moderate pain.   Yes [provider]  ?albuterol (PROVENTIL HFA;VENTOLIN  HFA) 108 (90 Base) MCG/ACT inhaler Inhale 1-2 puffs into the lungs every 6 (six) hours as needed for wheezing or shortness of breath. 04/19/17  Yes Rigoberto Noel, MD  ?apixaban (ELIQUIS) 5 MG TABS tablet Take 1 tablet (5 mg total) by mouth 2 (two) times daily. 11/11/21  Yes Evans Lance, MD  ?aspirin EC 81 MG tablet Take 81 mg by mouth every evening. Swallow whole.   Yes [provider]  ?atorvastatin (LIPITOR) 40 MG tablet TAKE ONE (1) TABLET BY MOUTH EACH DAY ?Patient taking differently: Take 20 mg by mouth daily. 12/09/20  Yes Jerline Pain, MD  ?cetirizine (ZYRTEC) 10 MG tablet Take 10 mg by mouth at  bedtime. 12/03/21  Yes [provider]  ?Ferrous Sulfate (IRON) 325 (65 Fe) MG TABS Take by mouth.   Yes [provider]  ?folic acid (FOLVITE) 1 MG tablet Take 1 mg by mouth daily.   Yes [provider]  ?furosemide (LASIX) 40 MG tablet Take 1 tablet (40 mg total) by mouth 2 (two) times daily. ?Patient taking differently: Take 80 mg by mouth 2 (two) times daily. 11/11/21  Yes Evans Lance, MD  ?gabapentin (NEURONTIN) 300 MG capsule Take 300 mg by mouth 3 (three) times daily. 12/10/21 01/10/22 Yes [provider]  ?isosorbide mononitrate (IMDUR) 60 MG 24 hr tablet TAKE ONE (1) TABLET BY MOUTH EACH DAY 12/09/20  Yes Jerline Pain, MD  ?lactobacillus acidophilus (BACID) TABS tablet Take 2 tablets by mouth 3 (three) times daily.   Yes [provider]  ?levothyroxine (SYNTHROID) 75 MCG tablet Take 75 mcg by mouth every morning. 11/29/21  Yes [provider]  ?metoprolol tartrate (LOPRESSOR) 25 MG tablet Take 25 mg by mouth 2 (two) times daily.   Yes [provider]  ?nitroGLYCERIN (NITROSTAT) 0.4 MG SL tablet Place 0.4 mg under the tongue every 5 (five) minutes as needed for chest pain.   Yes [provider]  ?Oxcarbazepine (TRILEPTAL) 300 MG tablet Take 1 tablet twice daily ?Patient taking differently: Take 300 mg by mouth 2 (two) times daily. 02/14/20  Yes Cameron Sprang, MD  ?pantoprazole (PROTONIX) 40 MG tablet Take 40 mg by mouth at bedtime.   Yes [provider]  ?potassium chloride (KLOR-CON) 10 MEQ tablet Take 10 mEq by mouth daily.   Yes [provider]  ?sertraline (ZOLOFT) 100 MG tablet TAKE 1 TABLET BY MOUTH AT BEDTIME ?Patient taking differently: Take 100 mg by mouth daily. 04/06/21  Yes Cameron Sprang, MD  ?traZODone (DESYREL) 50 MG tablet Take 50 mg by mouth at bedtime.   Yes [provider]  ?triamcinolone cream (KENALOG) 0.1 % Apply 1 application. topically 2 (two) times daily. 12/03/21  Yes [provider]  ? ? ?Past Medical History: ?Past Medical History:  ?Diagnosis Date  ? Anxiety   ? Arthritis   ? "all over" (07/29/2017)  ? Bulging disc   ? "still have one in my neck" (07/29/2017)  ? Carotid arterial disease ([place])   ? a. s/p L intracranial ICA stenting ([place]).  ? Cataract   ? "left eye; didn't take it out cause of macular degeneration" (07/29/2017)  ? Cervical cancer (HCC)   ? hx  ? Chronic kidney disease, stage III (moderate) (HCC)   ? "only have 1 working kidney" (07/29/2017)  ? COPD (chronic obstructive pulmonary disease) ([place])   ? a. quit smoking in 2007.  ? Coronary artery disease   ? a. 2007 s/p  MI and LAD stenting;  10/2006 CABG x 4: LIMA->LAD, VG->OM->dLCX, VG->RPL;  c. 07/2013 MV: no ischemia.  ? Depression   ? Esophageal reflux   ? Hepatitis 1968  ? "don't remember which one"  ? History of blood transfusion 10/29/2006  ? "related to bypass OR"  ? History of echocardiogram   ? a. 01/2012 Echo: EF 55%, mildly dil LA/RV, mild MR, Ao Sclerosis, mod TR.  ? Hypothyroid   ? Insomnia   ? Macular degeneration   ? "left eye" (07/29/2017)  ? Mixed hyperlipidemia   ? NSTEMI (non-ST elevated myocardial infarction) ([place]) 08/2006; 10/2006  ? Osteoporosis   ? Pneumonia   ? "several times before I quit smoking" (07/29/2017)  ? Presence of permanent cardiac pacemaker   ? PVT (paroxysmal ventricular tachycardia)   ? Restless leg syndrome   ? Stroke [facility]) ?2008  ? denies residual on 07/29/2017  ? Symptomatic bradycardia   ? a. s/p BSX Advantio dual chamber PPM Beckie Salts).  ? Syncope and collapse   ? Unspecified hearing loss   ? ? ?Past Surgical History: ?Past Surgical History:  ?Procedure Laterality Date  ? ABDOMINAL HYSTERECTOMY  1970s  ? "partial"  ? ANTERIOR CERVICAL DECOMP/DISCECTOMY FUSION  03/2001; 04/2005;?  date for 3rd OR  ? "used bone from my hips each time"  ? BACK SURGERY    ? CARDIAC CATHETERIZATION  2008  ? CATARACT EXTRACTION W/ INTRAOCULAR LENS IMPLANT Right 05/2013  ? CEREBRAL ANGIOGRAM Left  02/14/2007  ? w/ICA stent/notes 11/04/2010  ? COLONOSCOPY    ? CORONARY ANGIOPLASTY WITH STENT PLACEMENT  08/2006  ? "2 stents"  ? CORONARY ARTERY BYPASS GRAFT  10/2006  ? "CABG X4"  ? EYE SURGERY  05/2013  ? cataract remov

## 2021-12-13 NOTE — Progress Notes (Signed)
?PROGRESS NOTE ? ? ? ?ROMEY COHEA  GNF:[id number] DOB: Jun 29, 1946 DOA: 12/11/2021 ?PCP: [facility]  ? ? ?Brief Narrative:  ?76 y/o female admitted to the hospital after having a fall. She reported slipping and falling when she was reaching for her walker.  She felt as though she may have been somewhat sedated from pain medication she received earlier in the day.  Patient is chronically on Eliquis for atrial fibrillation.  Upon arrival to the emergency room, CT imaging did not indicate any fractures or intracranial hemorrhage.  She did have significant ecchymosis around her right orbit, right frontal scalp hematoma as well as some right conjunctival hemorrhage.  Vision remained intact.  Her work-up showed acute kidney injury, elevated LFTs, elevated BNP and cardiomegaly.  Echocardiogram showed preserved ejection fraction with severely reduced RV function.  Blood pressures remain 90s to 100s.  She was also noted to have urinary tract infection.  Echocardiogram and cardiac status reviewed with Dr. Haroldine Laws who agreed that patient would benefit from transfer to [facility] for advanced heart failure evaluation.  May need right heart cath.  Recommended starting on milrinone for now. ?  ? ? ? ?Consultants:  ?Cardiology HF ? ?Procedures:  ? ?Antimicrobials:  ?  ? ? ?Subjective: ?Denies sob, cp. C/o HA. No other complaints. ? ?Objective: ?Vitals:  ? 12/13/21 0236 12/13/21 0500 12/13/21 0540 12/13/21 0719  ?BP: (!) 119/55  (!) 126/46 (!) 110/47  ?Pulse: (!) 59  60 62  ?Resp: '14  20 20  '$ ?Temp: 97.7 ?F (36.5 ?C)  (!) 97.2 ?F (36.2 ?C) 97.6 ?F (36.4 ?C)  ?TempSrc: Oral  Oral Oral  ?SpO2: 92%  94% 94%  ?Weight:  91.3 kg    ?Height:      ? ? ?Intake/Output Summary (Last 24 hours) at 12/13/2021 0850 ?Last data filed at 12/13/2021 8469 ?Gross per 24 hour  ?Intake 25.46 ml  ?Output --  ?Net 25.46 ml  ? ?Filed Weights  ? 12/11/21 2236 12/12/21 2302 12/13/21 0500  ?Weight: 91.8 kg 91.9 kg 91.3 kg   ? ? ?Examination: ?Calm, NAD ?Rt forehead,eye with ecchymosis. Rt eye hemorrhage.  ?Decrease bs , no wheezing ?Reg s1/s2 3/6 SM  ?Soft benign +bs ?Mild edema b/l ?Awake and alert ?Mood and affect appropriate in current setting  ? ? ? ?Data Reviewed: I have personally reviewed following labs and imaging studies ? ?CBC: ?Recent Labs  ?Lab 12/11/21 ?0653 12/12/21 ?0503 12/13/21 ?6295  ?WBC 9.1 9.4 6.9  ?NEUTROABS 7.2  --   --   ?HGB 10.6* 10.5* 10.2*  ?HCT 34.6* 34.7* 32.7*  ?MCV 85.4 85.5 83.2  ?PLT 182 184 189  ? ?Basic Metabolic Panel: ?Recent Labs  ?Lab 12/11/21 ?0653 12/12/21 ?0503 12/13/21 ?2841  ?NA 134* 136 136  ?K 3.5 4.0 3.2*  ?CL 98 98 102  ?CO2 20* 20* 22  ?GLUCOSE 133* 148* 120*  ?BUN 67* 70* 67*  ?CREATININE 2.17* 2.29* 2.11*  ?CALCIUM 8.2* 8.5* 8.6*  ? ?GFR: ?Estimated Creatinine Clearance: 23.3 mL/min (A) (by C-G formula based on SCr of 2.11 mg/dL (H)). ?Liver Function Tests: ?Recent Labs  ?Lab 12/11/21 ?0653 12/12/21 ?0503 12/13/21 ?3244  ?AST 109* 102* 66*  ?ALT 140* 151* 124*  ?ALKPHOS 249* 265* 265*  ?BILITOT 1.8* 2.3* 1.0  ?PROT 6.9 6.8 6.0*  ?ALBUMIN 3.7 3.7 3.3*  ? ?No results for input(s): LIPASE, AMYLASE in the last 168 hours. ?Recent Labs  ?Lab 12/11/21 ?1243  ?AMMONIA 49*  ? ?Coagulation Profile: ?No results  for input(s): INR, PROTIME in the last 168 hours. ?Cardiac Enzymes: ?No results for input(s): CKTOTAL, CKMB, CKMBINDEX, TROPONINI in the last 168 hours. ?BNP (last 3 results) ?No results for input(s): PROBNP in the last 8760 hours. ?HbA1C: ?No results for input(s): HGBA1C in the last 72 hours. ?CBG: ?Recent Labs  ?Lab 12/12/21 ?2109  ?GLUCAP 144*  ? ?Lipid Profile: ?No results for input(s): CHOL, HDL, LDLCALC, TRIG, CHOLHDL, LDLDIRECT in the last 72 hours. ?Thyroid Function Tests: ?No results for input(s): TSH, T4TOTAL, FREET4, T3FREE, THYROIDAB in the last 72 hours. ?Anemia Panel: ?No results for input(s): VITAMINB12, FOLATE, FERRITIN, TIBC, IRON, RETICCTPCT in the last 72  hours. ?Sepsis Labs: ?Recent Labs  ?Lab 12/11/21 ?0350  ?LATICACIDVEN 1.9  ? ? ?Recent Results (from the past 240 hour(s))  ?Resp Panel by RT-PCR (Flu A&B, Covid) Nasopharyngeal Swab     Status: None  ? Collection Time: 12/11/21  8:48 AM  ? Specimen: Nasopharyngeal Swab; Nasopharyngeal(NP) swabs in vial transport medium  ?Result Value Ref Range Status  ? SARS Coronavirus 2 by RT PCR NEGATIVE NEGATIVE Final  ?  Comment: (NOTE) ?SARS-CoV-2 target nucleic acids are NOT DETECTED. ? ?The SARS-CoV-2 RNA is generally detectable in upper respiratory ?specimens during the acute phase of infection. The lowest ?concentration of SARS-CoV-2 viral copies this assay can detect is ?138 copies/mL. A negative result does not preclude SARS-Cov-2 ?infection and should not be used as the sole basis for treatment or ?other patient management decisions. A negative result may occur with  ?improper specimen collection/handling, submission of specimen other ?than nasopharyngeal swab, presence of viral mutation(s) within the ?areas targeted by this assay, and inadequate number of viral ?copies(<138 copies/mL). A negative result must be combined with ?clinical observations, patient history, and epidemiological ?information. The expected result is Negative. ? ?Fact Sheet for Patients:  ?EntrepreneurPulse.com.au ? ?Fact Sheet for Healthcare Providers:  ?IncredibleEmployment.be ? ?This test is no t yet approved or cleared by the [place] FDA and  ?has been authorized for detection and/or diagnosis of SARS-CoV-2 by ?FDA under an Emergency Use Authorization (EUA). This EUA will remain  ?in effect (meaning this test can be used) for the duration of the ?COVID-19 declaration under Section 564(b)(1) of the Act, 21 ?U.S.C.section 360bbb-3(b)(1), unless the authorization is terminated  ?or revoked sooner.  ? ? ?  ? Influenza A by PCR NEGATIVE NEGATIVE Final  ? Influenza B by PCR NEGATIVE NEGATIVE Final  ?   Comment: (NOTE) ?The Xpert Xpress SARS-CoV-2/FLU/RSV plus assay is intended as an aid ?in the diagnosis of influenza from Nasopharyngeal swab specimens and ?should not be used as a sole basis for treatment. Nasal washings and ?aspirates are unacceptable for Xpert Xpress SARS-CoV-2/FLU/RSV ?testing. ? ?Fact Sheet for Patients: ?EntrepreneurPulse.com.au ? ?Fact Sheet for Healthcare Providers: ?IncredibleEmployment.be ? ?This test is not yet approved or cleared by the [place] FDA and ?has been authorized for detection and/or diagnosis of SARS-CoV-2 by ?FDA under an Emergency Use Authorization (EUA). This EUA will remain ?in effect (meaning this test can be used) for the duration of the ?COVID-19 declaration under Section 564(b)(1) of the Act, 21 U.S.C. ?section 360bbb-3(b)(1), unless the authorization is terminated or ?revoked. ? ?Performed at [facility], [address], [place],  [address] ?  ?Urine Culture     Status: Abnormal (Preliminary result)  ? Collection Time: 12/11/21  9:20 AM  ? Specimen: Urine, Clean Catch  ?Result Value Ref Range Status  ? Specimen Description   Final  ?  URINE,  CLEAN CATCH ?Performed at [facility], [address] [place], [place] [address] ?  ? Special Requests   Final  ?  URINE, CLEAN CATCH ?Performed at [facility], [address], [place], [place] [address] ?  ? Culture (A)  Final  ?  >=100,000 COLONIES/mL KLEBSIELLA PNEUMONIAE ?SUSCEPTIBILITIES TO FOLLOW ?Performed at [facility], [place] [address], [place],  [address] ?  ? Report Status PENDING  Incomplete  ?MRSA Next Gen by PCR, Nasal     Status: None  ? Collection Time: 12/12/21  8:33 PM  ? Specimen: Nasal Mucosa; Nasal Swab  ?Result Value Ref Range Status  ? MRSA by PCR Next Gen NOT DETECTED NOT DETECTED Final  ?  Comment: (NOTE) ?The GeneXpert MRSA Assay (FDA approved for NASAL specimens only), ?is one component of a comprehensive MRSA colonization  surveillance ?program. It is not intended to diagnose MRSA infection nor to guide ?or monitor treatment for MRSA infections. ?Test performance is not FDA approved in patients less than 2 years ?old. ?Performed at [facility], [address], [place]

## 2021-12-14 ENCOUNTER — Inpatient Hospital Stay: Payer: Self-pay

## 2021-12-14 ENCOUNTER — Inpatient Hospital Stay (HOSPITAL_COMMUNITY): Payer: Medicare Other

## 2021-12-14 DIAGNOSIS — K7460 Unspecified cirrhosis of liver: Secondary | ICD-10-CM | POA: Diagnosis not present

## 2021-12-14 DIAGNOSIS — R7989 Other specified abnormal findings of blood chemistry: Secondary | ICD-10-CM | POA: Diagnosis not present

## 2021-12-14 DIAGNOSIS — I50811 Acute right heart failure: Secondary | ICD-10-CM | POA: Diagnosis not present

## 2021-12-14 DIAGNOSIS — N179 Acute kidney failure, unspecified: Secondary | ICD-10-CM | POA: Diagnosis not present

## 2021-12-14 DIAGNOSIS — E876 Hypokalemia: Secondary | ICD-10-CM

## 2021-12-14 LAB — BASIC METABOLIC PANEL
Anion gap: 10 (ref 5–15)
BUN: 57 mg/dL — ABNORMAL HIGH (ref 8–23)
CO2: 22 mmol/L (ref 22–32)
Calcium: 8.5 mg/dL — ABNORMAL LOW (ref 8.9–10.3)
Chloride: 103 mmol/L (ref 98–111)
Creatinine, Ser: 1.68 mg/dL — ABNORMAL HIGH (ref 0.44–1.00)
GFR, Estimated: 31 mL/min — ABNORMAL LOW (ref >60)
Glucose, Bld: 200 mg/dL — ABNORMAL HIGH (ref 70–99)
Potassium: 3.3 mmol/L — ABNORMAL LOW (ref 3.5–5.1)
Sodium: 135 mmol/L (ref 135–145)

## 2021-12-14 LAB — COOXEMETRY PANEL
Carboxyhemoglobin: 2.1 % — ABNORMAL HIGH (ref 0.5–1.5)
Methemoglobin: 1 % (ref 0.0–1.5)
O2 Saturation: 93.9 %
Total hemoglobin: 10.8 g/dL — ABNORMAL LOW (ref 12.0–16.0)

## 2021-12-14 LAB — APTT: aPTT: 61 seconds — ABNORMAL HIGH (ref 24–36)

## 2021-12-14 LAB — HEPARIN LEVEL (UNFRACTIONATED): Heparin Unfractionated: 0.22 IU/mL — ABNORMAL LOW (ref 0.30–0.70)

## 2021-12-14 MED ORDER — SODIUM CHLORIDE 0.9% FLUSH
10.0000 mL | Freq: Two times a day (BID) | INTRAVENOUS | Status: DC
  Administered 2021-12-14: 10 mL
  Administered 2021-12-16: 20 mL
  Administered 2021-12-16 – 2021-12-18 (×5): 10 mL

## 2021-12-14 MED ORDER — POTASSIUM CHLORIDE CRYS ER 20 MEQ PO TBCR
40.0000 meq | EXTENDED_RELEASE_TABLET | Freq: Two times a day (BID) | ORAL | Status: DC
  Administered 2021-12-14 – 2021-12-17 (×7): 40 meq via ORAL
  Filled 2021-12-14 (×7): qty 2

## 2021-12-14 MED ORDER — SODIUM CHLORIDE 0.9% FLUSH
3.0000 mL | Freq: Two times a day (BID) | INTRAVENOUS | Status: DC
  Administered 2021-12-14 (×2): 3 mL via INTRAVENOUS

## 2021-12-14 MED ORDER — SODIUM CHLORIDE 0.9 % IV SOLN
2.0000 g | Freq: Two times a day (BID) | INTRAVENOUS | Status: DC
  Administered 2021-12-14 – 2021-12-15 (×3): 2 g via INTRAVENOUS
  Filled 2021-12-14 (×3): qty 2

## 2021-12-14 MED ORDER — GABAPENTIN 300 MG PO CAPS
300.0000 mg | ORAL_CAPSULE | Freq: Three times a day (TID) | ORAL | Status: DC
  Administered 2021-12-14 – 2021-12-18 (×11): 300 mg via ORAL
  Filled 2021-12-14 (×11): qty 1

## 2021-12-14 MED ORDER — SODIUM CHLORIDE 0.9 % IV SOLN: INTRAVENOUS | Status: DC

## 2021-12-14 MED ORDER — HEPARIN (PORCINE) 25000 UT/250ML-% IV SOLN
1050.0000 [IU]/h | INTRAVENOUS | Status: DC
  Administered 2021-12-14: 900 [IU]/h via INTRAVENOUS
  Administered 2021-12-15 – 2021-12-16 (×2): 1050 [IU]/h via INTRAVENOUS
  Filled 2021-12-14 (×3): qty 250

## 2021-12-14 MED ORDER — LORAZEPAM 2 MG/ML IJ SOLN
0.5000 mg | Freq: Once | INTRAMUSCULAR | Status: AC | PRN
  Administered 2021-12-14: 0.5 mg via INTRAVENOUS
  Filled 2021-12-14: qty 1

## 2021-12-14 MED ORDER — AMIODARONE HCL 200 MG PO TABS
200.0000 mg | ORAL_TABLET | Freq: Two times a day (BID) | ORAL | Status: DC
  Administered 2021-12-14 – 2021-12-17 (×8): 200 mg via ORAL
  Filled 2021-12-14 (×8): qty 1

## 2021-12-14 MED ORDER — SODIUM CHLORIDE 0.9% FLUSH: 10.0000 mL | INTRAVENOUS | Status: DC | PRN

## 2021-12-14 MED ORDER — GERHARDT'S BUTT CREAM
TOPICAL_CREAM | Freq: Two times a day (BID) | CUTANEOUS | Status: DC
  Filled 2021-12-14: qty 1

## 2021-12-14 NOTE — Progress Notes (Addendum)
? ? Advanced Heart Failure Rounding Note ? ?PCP-Cardiologist: None  ? ?Subjective:   ?3/19 Continued on milrinone and started on lasix drip.  ? ?I/O not accurate.  ? ?Feeling ok. Denies headache. Complaining of neck discomfort.  ? ?Objective:   ?Weight Range: ?92.8 kg ?Body mass index is 38.66 kg/m?.  ? ?Vital Signs:   ?Temp:  [97.4 ?F (36.3 ?C)-98.1 ?F (36.7 ?C)] 97.4 ?F (36.3 ?C) (03/20 4562) ?Pulse Rate:  [57-65] 64 (03/20 0733) ?Resp:  [20-23] 20 (03/20 0733) ?BP: (129-138)/(48-58) 136/48 (03/20 5638) ?SpO2:  [94 %-98 %] 97 % (03/20 0733) ?Weight:  [92.8 kg] 92.8 kg (03/20 0221) ?Last BM Date : 12/12/21 ? ?Weight change: ?Filed Weights  ? 12/12/21 2302 12/13/21 0500 12/14/21 0221  ?Weight: 91.9 kg 91.3 kg 92.8 kg  ? ? ?Intake/Output:  ? ?Intake/Output Summary (Last 24 hours) at 12/14/2021 0741 ?Last data filed at 12/14/2021 9373 ?Gross per 24 hour  ?Intake 960 ml  ?Output 1850 ml  ?Net -890 ml  ?  ? ? ?Physical Exam  ?  ?General:   No resp difficulty ?HEENT: Forehead/ R periordital ecchymotic.  ?Neck: Supple. JVP jaw . Carotids 2+ bilat; no bruits. No lymphadenopathy or thyromegaly appreciated. ?Cor: PMI nondisplaced. Regular rate & rhythm. No rubs, gallops or murmurs. ?Lungs: Clear on 2 liters  ?Abdomen: Soft, nontender, nondistended. No hepatosplenomegaly. No bruits or masses. Good bowel sounds. ?Extremities: No cyanosis, clubbing, rash, R and LLE 1-2+ edema extending to thighs.  ?Neuro: Alert & orientedx3, cranial nerves grossly intact. moves all 4 extremities w/o difficulty. Affect pleasant ? ? ?Telemetry  ? ?A paced 50-60s Frequent PVCs (10-19 per hour) ? ?EKG  ?  ?N/A ? ?Labs  ?  ?CBC ?Recent Labs  ?  12/12/21 ?0503 12/13/21 ?4287  ?WBC 9.4 6.9  ?HGB 10.5* 10.2*  ?HCT 34.7* 32.7*  ?MCV 85.5 83.2  ?PLT 184 189  ? ?Basic Metabolic Panel ?Recent Labs  ?  12/13/21 ?0307 12/14/21 ?0313  ?NA 136 135  ?K 3.2* 3.3*  ?CL 102 103  ?CO2 22 22  ?GLUCOSE 120* 200*  ?BUN 67* 57*  ?CREATININE 2.11* 1.68*  ?CALCIUM 8.6*  8.5*  ? ?Liver Function Tests ?Recent Labs  ?  12/12/21 ?0503 12/13/21 ?6811  ?AST 102* 66*  ?ALT 151* 124*  ?ALKPHOS 265* 265*  ?BILITOT 2.3* 1.0  ?PROT 6.8 6.0*  ?ALBUMIN 3.7 3.3*  ? ?No results for input(s): LIPASE, AMYLASE in the last 72 hours. ?Cardiac Enzymes ?No results for input(s): CKTOTAL, CKMB, CKMBINDEX, TROPONINI in the last 72 hours. ? ?BNP: ?BNP (last 3 results) ?Recent Labs  ?  12/11/21 ?0653  ?BNP 1,055.0*  ? ? ?ProBNP (last 3 results) ?No results for input(s): PROBNP in the last 8760 hours. ? ? ?D-Dimer ?No results for input(s): DDIMER in the last 72 hours. ?Hemoglobin A1C ?No results for input(s): HGBA1C in the last 72 hours. ?Fasting Lipid Panel ?No results for input(s): CHOL, HDL, LDLCALC, TRIG, CHOLHDL, LDLDIRECT in the last 72 hours. ?Thyroid Function Tests ?No results for input(s): TSH, T4TOTAL, T3FREE, THYROIDAB in the last 72 hours. ? ?Invalid input(s): FREET3 ? ?Other results: ? ? ?Imaging  ? ? ?No results found. ? ? ?Medications:   ? ? ?Scheduled Medications: ? Chlorhexidine Gluconate Cloth  6 each Topical Daily  ? diclofenac Sodium  4 g Topical QID  ? fluticasone furoate-vilanterol  1 puff Inhalation Daily  ? And  ? umeclidinium bromide  1 puff Inhalation Daily  ? lactulose  20 g Oral  BID  ? levothyroxine  75 mcg Oral Q0600  ? Oxcarbazepine  300 mg Oral BID  ? pantoprazole  40 mg Oral QHS  ? pramipexole  1 mg Oral QHS  ? sertraline  100 mg Oral Daily  ? spironolactone  25 mg Oral Daily  ? ? ?Infusions: ? cefTRIAXone (ROCEPHIN)  IV 1 g (12/13/21 1339)  ? furosemide (LASIX) 200 mg in dextrose 5% 100 mL ('2mg'$ /mL) infusion 15 mg/hr (12/14/21 0427)  ? milrinone 0.25 mcg/kg/min (12/14/21 0431)  ? ? ?PRN Medications: ?acetaminophen **OR** acetaminophen, ondansetron **OR** ondansetron (ZOFRAN) IV, polyvinyl alcohol ? ? ? ?Patient Profile  ? ?  ?Ms. Jaimes is a 76 y.o. female with obesity, COPD, CAD s/p CABG 2008, CKD stage IIIa (with solitary kidney baseline Scr 1.3-1.5), PAF with tachy-brady  syndrome s/p dual chamber pacemaker (Dr. Lovena Le 2014), chronic diastolic HF.   ? ?Transferred from [facility] to [facility] for HF consultation.   ? ?Assessment/Plan  ?1. 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 D-shaped septum. No significant PAH with TR tracing ?- suspect primary issue here is RV dilation with severe TR either due to longstanding severe PAH in setting of OSA/OHS/COPD (but PAH mild on previous echos dating back to 2013) or leaflet tethering from pacer leads ?- Continue milrinone to augment diuresis. Renal function improving.  ?- Volume status improving but still needs a few more days. Continue lasix drip ?- Place UNNA boots ?- will need RHC and TEE once more diuresed Consider TV clip  ?- Place PICC ?  ?2. CAD s/p CABG ?- no s/s angina ?- off ASA with DOAC. Off statin with shock liver ?  ?3. AKI on CKD 3a ?- baseline SCr 1.3-1.5 ?- has scarred R kidney on u/s ?- SCr peaked 2.3-->today 1.7  ?- Likely due to ATN from shock ?- Continue hemodynamic support ?  ?4. 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  ?- Continue milrinone and diuresis ?  ?5. PAF with tachy-brady syndrome ?- in NSR ?- Apixaban currently on hold.  ?- ? Add heparin drip ?  ?6. Hypokalemia ?-Supp K  ?  ?7. Fall with head trauma ?- Head CT negative for bleed ?  ?8. PVCs ?-High PVC burden. 10-19 per hour ?-Add amio 200 mg twice a day to help suppress.  ?- Discussed with pharmacy.    ? ? ?9. DNR/DNI ? ?Place PICC to guide diuresis. Check CVP/CO-OX  ?  ? ?Set up TEE/RHC Wed -- orders placed  ? ?Length of Stay: 3 ? ?Darrick Grinder, NP  ?12/14/2021, 7:41 AM ? ?Advanced Heart Failure Team ?Pager [phone] (M-F; 7a - 5p)  ?Please contact [facility] for night-coverage after hours (5p -7a ) and weekends on [email or web address] ? ?Patient seen and examined with the above-signed Advanced Practice Provider and/or Housestaff. I personally reviewed laboratory  data, imaging studies and relevant notes. I independently examined the patient and formulated the important aspects of the plan. I have edited the note to reflect any of my changes or salient points. I have personally discussed the plan with the patient and/or family. ? ?Remains on milrinone and IV lasix. Diuresing well. SCr improved. Now with frequent PVCs. ? ?General:  Weak appearing. No resp difficulty ?HEENT: normal + ecchymosis ?Neck: supple. no JVD. Carotids 2+ bilat; no bruits. No lymphadenopathy or thryomegaly appreciated. ?Cor: PMI nondisplaced. Regular rate & rhythm. 3/6  TR. ?Lungs: clear ?Abdomen: obese soft, nontender, nondistended. No hepatosplenomegaly. No bruits or masses. Good bowel sounds. ?Extremities: no cyanosis, clubbing, rash, 3+ edema ?Neuro: alert & orientedx3, cranial nerves grossly intact. moves all 4 extremities w/o difficulty. Affect pleasant ? ?Continue milrinone. Continue diuresis. Will need RHC and TEE on Wednesday to look at RV and tricuspid valve. Ok to start heparin (low range) ? ?Glori Bickers, MD  ?9:09 AM ? ? ?

## 2021-12-14 NOTE — Progress Notes (Signed)
PICC team RN reported that they will be here either late afternoon or tonight for PICC insertion. ?

## 2021-12-14 NOTE — Progress Notes (Signed)
Patient had multiple watery stools >10 times. ?Notified MD. Told to hold lactulose this evening. ?

## 2021-12-14 NOTE — Progress Notes (Signed)
ANTICOAGULATION CONSULT NOTE - Initial Consult ? ?Pharmacy Consult for Heparin  ?Indication: atrial fibrillation ? ?Allergies  ?Allergen Reactions  ? Iohexol Anaphylaxis  ?   Desc: PT HAD CONTRAST REACTION YRS AGO NEEDS 13 HR PREP FOR ALL CONTRAST STUDIES ?  ? Codeine   ?  swelling  ? ? ?Patient Measurements: ?Height: '5\' 1"'$  (154.9 cm) ?Weight: 92.8 kg (204 lb 9.4 oz) ?IBW/kg (Calculated) : 47.8 ?Heparin Dosing Weight: 70kg  ? ?Vital Signs: ?Temp: 97.5 ?F (36.4 ?C) (03/20 1045) ?Temp Source: Oral (03/20 1045) ?BP: 151/56 (03/20 1045) ?Pulse Rate: 63 (03/20 1045) ? ?Labs: ?Recent Labs  ?  12/12/21 ?0503 12/13/21 ?8144 12/14/21 ?8185  ?HGB 10.5* 10.2*  --   ?HCT 34.7* 32.7*  --   ?PLT 184 189  --   ?CREATININE 2.29* 2.11* 1.68*  ? ? ?Estimated Creatinine Clearance: 29.6 mL/min (A) (by C-G formula based on SCr of 1.68 mg/dL (H)). ? ? ?Medical History: ?Past Medical History:  ?Diagnosis Date  ? Anxiety   ? Arthritis   ? "all over" (07/29/2017)  ? Bulging disc   ? "still have one in my neck" (07/29/2017)  ? Carotid arterial disease ([place])   ? a. s/p L intracranial ICA stenting ([place]).  ? Cataract   ? "left eye; didn't take it out cause of macular degeneration" (07/29/2017)  ? Cervical cancer (HCC)   ? hx  ? Chronic kidney disease, stage III (moderate) (HCC)   ? "only have 1 working kidney" (07/29/2017)  ? COPD (chronic obstructive pulmonary disease) ([place])   ? a. quit smoking in 2007.  ? Coronary artery disease   ? a. 2007 s/p MI and LAD stenting;  10/2006 CABG x 4: LIMA->LAD, VG->OM->dLCX, VG->RPL;  c. 07/2013 MV: no ischemia.  ? Depression   ? Esophageal reflux   ? Hepatitis 1968  ? "don't remember which one"  ? History of blood transfusion 10/29/2006  ? "related to bypass OR"  ? History of echocardiogram   ? a. 01/2012 Echo: EF 55%, mildly dil LA/RV, mild MR, Ao Sclerosis, mod TR.  ? Hypothyroid   ? Insomnia   ? Macular degeneration   ? "left eye" (07/29/2017)  ? Mixed hyperlipidemia   ? NSTEMI (non-ST elevated myocardial  infarction) ([place]) 08/2006; 10/2006  ? Osteoporosis   ? Pneumonia   ? "several times before I quit smoking" (07/29/2017)  ? Presence of permanent cardiac pacemaker   ? PVT (paroxysmal ventricular tachycardia)   ? Restless leg syndrome   ? Stroke [facility]) ?2008  ? denies residual on 07/29/2017  ? Symptomatic bradycardia   ? a. s/p BSX Advantio dual chamber P[facility] Beckie Salts).  ? Syncope and collapse   ? Unspecified hearing loss   ? ? ? ? ?Assessment: ?76yof admitted s/p fall  - head CT negative for bleed, VQ negative for PE.  Hx Afib on apixaban pta - held on admit > cbc stable > resume anticoagulation.  Pharmacy asked to dose heparin while apixaban on hold for upcoming procedures.   ?Apixaban falsely elevates heparin level- will dose heparin with aptt until apixaban cleared and values correlate.   ? ?Goal of Therapy:  ?Heparin level 0.3-0.7 units/ml ?APTT 66-103 sec  ?Monitor platelets by anticoagulation protocol: Yes ?  ?Plan:  ?No bolus  ?Heparin drip 900 uts/hr  ?Check APTT and heparin level 6hr after starting heparin ?Daily Heparin level and CBC  ?Monitor s/s bleeding  ? ? ?Bonnita Nasuti Pharm.D. CPP, BCPS ?Clinical Pharmacist ?[phone] ?12/14/2021 12:45 PM  ? ? ?

## 2021-12-14 NOTE — Progress Notes (Signed)
Peripherally Inserted Central Catheter Placement ? ?The IV Nurse has discussed with the patient and/or persons authorized to consent for the patient, the purpose of this procedure and the potential benefits and risks involved with this procedure.  The benefits include less needle sticks, lab draws from the catheter, and the patient may be discharged home with the catheter. Risks include, but not limited to, infection, bleeding, blood clot (thrombus formation), and puncture of an artery; nerve damage and irregular heartbeat and possibility to perform a PICC exchange if needed/ordered by physician.  Alternatives to this procedure were also discussed.  Bard Power PICC patient education guide, fact sheet on infection prevention and patient information card has been provided to patient /or left at bedside.   ? ?PICC Placement Documentation  ?PICC Triple Lumen 22/48/25 Right Cephalic 35 cm 0 cm (Active)  ?Indication for Insertion or Continuance of Line Vasoactive infusions 12/14/21 2154  ?Exposed Catheter (cm) 0 cm 12/14/21 2154  ?Site Assessment Clean, Dry, Intact 12/14/21 2154  ?Lumen #1 Status Flushed;Blood return noted;Saline locked 12/14/21 2154  ?Lumen #2 Status Flushed;Blood return noted;Saline locked 12/14/21 2154  ?Lumen #3 Status Flushed;Blood return noted;Saline locked 12/14/21 2154  ?Dressing Type Securing device;Transparent 12/14/21 2154  ?Dressing Status Antimicrobial disc in place;Clean, Dry, Intact 12/14/21 2154  ?Safety Lock Not Applicable 00/37/04 8889  ?Line Care Connections checked and tightened 12/14/21 2154  ?Dressing Intervention New dressing 12/14/21 2154  ?Dressing Change Due 12/21/21 12/14/21 2154  ? ? ? ? ? ?[name], [name] ?12/14/2021, 9:56 PM ? ?

## 2021-12-14 NOTE — Progress Notes (Signed)
Physical Therapy Treatment ?Patient Details ?Name: [name] ?MRN: [id number] ?DOB: 05/29/1946 ?Today's Date: 12/14/2021 ? ? ?History of Present Illness The pt is a 76 yo female presenting 3/17 from her home in a retirement community after sustaining a fall getting out of bed. Upon arrival, pt also noted to have AMS, hypoxia, elevated LFT and BNP. Pt transferred to [place] 3/18 for continued HF evaluation.  Plan for need RHC and TEE once more diuresed. PMH includes: COPD, CHF, anxiety, arthritis, CAD s/p ICA and LAD stenting and CABG in 2008, CKD III, NSTEMI, PVT, stroke, and bradycardia. ? ?  ?PT Comments  ? ? The pt presents with similar mobility as noted on initial PT evaluation at [facility]. Therefore, no need to update goals or d/c plan at this time. The pt was able to complete sit-stand transfers with minA and BUE support on RW, and was limited to 3 x 4 ft ambulation today with minA to steady and RW for BUE support. She presents with poor functional strength and power in LE which also fatigue rapidly. The pt complains of significant pain "everywhere" through the session, but was agreeable to limited mobility as described above at this time. The pt would like to return to her retirement community (describes services available as similar to independent living facility), but will require SNF rehab prior to being safe to return to that level of independence.  ?   ?Recommendations for follow up therapy are one component of a multi-disciplinary discharge planning process, led by the attending physician.  Recommendations may be updated based on patient status, additional functional criteria and insurance authorization. ? ?Follow Up Recommendations ? Skilled nursing-short term rehab (<3 hours/day) ?  ?  ?Assistance Recommended at Discharge Frequent or constant Supervision/Assistance  ?Patient can return home with the following A lot of help with bathing/dressing/bathroom;A lot of help with walking and/or  transfers;Help with stairs or ramp for entrance;Assistance with cooking/housework;Assist for transportation ?  ?Equipment Recommendations ? None recommended by PT  ?  ?Recommendations for Other Services   ? ? ?  ?Precautions / Restrictions Precautions ?Precautions: Fall ?Restrictions ?Weight Bearing Restrictions: No  ?  ? ?Mobility ? Bed Mobility ?Overal bed mobility: Needs Assistance ?Bed Mobility: Sit to Supine ?  ?  ?  ?Sit to supine: Mod assist ?  ?General bed mobility comments: modA to return to bed, assist to LE ?  ? ?Transfers ?Overall transfer level: Needs assistance ?Equipment used: Rolling walker (2 wheels) ?Transfers: Sit to/from Stand, Bed to chair/wheelchair/BSC ?Sit to Stand: Min assist ?  ?Step pivot transfers: Min assist ?  ?  ?  ?General transfer comment: minA to power up to standing, and steady while turning ?  ? ?Ambulation/Gait ?Ambulation/Gait assistance: Min assist ?Gait Distance (Feet): 4 Feet (x3) ?Assistive device: Rolling walker (2 wheels) ?Gait Pattern/deviations: Step-to pattern, Decreased stride length, Shuffle, Narrow base of support ?Gait velocity: decreased ?Gait velocity interpretation: <1.31 ft/sec, indicative of household ambulator ?  ?General Gait Details: pt taking small steps with minimal stride and clearance. minA to steady and manage RW ? ? ? ?  ?Balance Overall balance assessment: History of Falls, Needs assistance ?Sitting-balance support: Bilateral upper extremity supported, Feet supported ?Sitting balance-Leahy Scale: Fair ?Sitting balance - Comments: fair sitting balance with therapist min A on EOB with feet on floor and hands on mattress ?Postural control: Posterior lean ?Standing balance support: Bilateral upper extremity supported, During functional activity, Reliant on assistive device for balance ?Standing balance-Leahy Scale: Poor ?Standing  balance comment: Poor today with need of BUE support on RW and external assist for safety. ?  ?  ?  ?  ?  ?  ?  ?  ?  ?  ?  ?   ? ?  ?Cognition Arousal/Alertness: Awake/alert ?Behavior During Therapy: Restless, Anxious ?Overall Cognitive Status: Impaired/Different from baseline ?Area of Impairment: Safety/judgement ?  ?  ?  ?  ?  ?  ?  ?  ?  ?  ?  ?  ?Safety/Judgement: Decreased awareness of safety ?  ?  ?General Comments: pt following all cues and instructions, poor insight to deficits and awareness but could be at her baseline. pt with significant internal distraction by pain ?  ?  ? ?  ?Exercises   ? ?  ?General Comments General comments (skin integrity, edema, etc.): VSS ?  ?  ? ?Pertinent Vitals/Pain Pain Assessment ?Pain Assessment: Faces ?Faces Pain Scale: Hurts even more ?Pain Location: headache and "everywhere" ?Pain Descriptors / Indicators: Aching, Discomfort, Crying, Moaning ?Pain Intervention(s): Limited activity within patient's tolerance, Monitored during session, Repositioned  ? ? ? ?PT Goals (current goals can now be found in the care plan section) Acute Rehab PT Goals ?Patient Stated Goal: to return to retirement home ?PT Goal Formulation: With patient ?Time For Goal Achievement: 12/26/21 ?Potential to Achieve Goals: Good ?Progress towards PT goals: Progressing toward goals ? ?  ?Frequency ? ? ? Min 2X/week ? ? ? ?  ?PT Plan Current plan remains appropriate  ? ? ?   ?AM-PAC PT "6 Clicks" Mobility   ?Outcome Measure ? Help needed turning from your back to your side while in a flat bed without using bedrails?: A Little ?Help needed moving from lying on your back to sitting on the side of a flat bed without using bedrails?: A Little ?Help needed moving to and from a bed to a chair (including a wheelchair)?: A Lot ?Help needed standing up from a chair using your arms (e.g., wheelchair or bedside chair)?: A Lot ?Help needed to walk in hospital room?: A Lot ?Help needed climbing 3-5 steps with a railing? : A Lot ?6 Click Score: 14 ? ?  ?End of Session Equipment Utilized During Treatment: Gait belt ?Activity Tolerance: Patient  tolerated treatment well ?Patient left: in bed;with call bell/phone within reach;with bed alarm set;with nursing/sitter in room ?Nurse Communication: Mobility status ?PT Visit Diagnosis: Unsteadiness on feet (R26.81);Other abnormalities of gait and mobility (R26.89);Muscle weakness (generalized) (M62.81);History of falling (Z91.81) ?  ? ? ?Time: 3009-2330 ?PT Time Calculation (min) (ACUTE ONLY): 16 min ? ?Charges:  $Therapeutic Exercise: 8-22 mins          ?          ? ?West Carbo, PT, DPT  ? ?Acute Rehabilitation Department ?Pager #: [phone] - [phone] ? ? ?Sandra Cockayne ?12/14/2021, 4:23 PM ? ?

## 2021-12-14 NOTE — Care Management Important Message (Signed)
Important Message ? ?Patient Details  ?Name: [name] ?MRN: [id number] ?Date of Birth: 1945/12/27 ? ? ?Medicare Important Message Given:  Yes ? ? ? ? ?[name] ?12/14/2021, 9:28 AM ?

## 2021-12-14 NOTE — Progress Notes (Signed)
PT Cancellation Note ? ?Patient Details ?Name: [name] ?MRN: [id number] ?DOB: August 04, 1946 ? ? ?Cancelled Treatment:    Reason Eval/Treat Not Completed: Medical issues which prohibited therapy as pt with multiple soft BP prior to start of session and in significant pain from BP measurement. Given I would need to take multiple BP measurements to monitor during mobility, pt requested I return at a later time. Will continue to follow and circle back as time/schedule allow.  ? ?West Carbo, PT, DPT  ? ?Acute Rehabilitation Department ?Pager #: [phone] - [phone] ? ? ?Sandra Cockayne ?12/14/2021, 12:57 PM ?

## 2021-12-14 NOTE — Progress Notes (Signed)
?PROGRESS NOTE ? ? ? ?[name]  JOI:[id number] DOB: 23-Jun-1946 DOA: 12/11/2021 ?PCP: [facility]  ? ? ?Brief Narrative:  ?76 y/o female admitted to the hospital after having a fall. She reported slipping and falling when she was reaching for her walker.  She felt as though she may have been somewhat sedated from pain medication she received earlier in the day.  Patient is chronically on Eliquis for atrial fibrillation.  Upon arrival to the emergency room, CT imaging did not indicate any fractures or intracranial hemorrhage.  She did have significant ecchymosis around her right orbit, right frontal scalp hematoma as well as some right conjunctival hemorrhage.  Vision remained intact.  Her work-up showed acute kidney injury, elevated LFTs, elevated BNP and cardiomegaly.  Echocardiogram showed preserved ejection fraction with severely reduced RV function.  Blood pressures remain 90s to 100s.  She was also noted to have urinary tract infection.  Echocardiogram and cardiac status reviewed with Dr. Haroldine Laws who agreed that patient would benefit from transfer to [facility] for advanced heart failure evaluation.  May need right heart cath.   ? ?3/20 no blurry vision. Able to see clearly from right eye. On milrinone gtt. ?  ? ? ? ?Consultants:  ?Cardiology HF ? ?Procedures:  ? ?Antimicrobials:  ?  ? ? ?Subjective: ?Sob about the same. No cp. No dizziness ? ?Objective: ?Vitals:  ? 12/14/21 0221 12/14/21 4709 12/14/21 6283 12/14/21 0742  ?BP:  (!) 133/58 (!) 136/48   ?Pulse:  (!) 58 64 74  ?Resp:  (!) 23 20 (!) 120  ?Temp:   (!) 97.4 ?F (36.3 ?C)   ?TempSrc:   Oral   ?SpO2:  98% 97% 97%  ?Weight: 92.8 kg     ?Height:      ? ? ?Intake/Output Summary (Last 24 hours) at 12/14/2021 0823 ?Last data filed at 12/14/2021 6629 ?Gross per 24 hour  ?Intake 1080 ml  ?Output 1850 ml  ?Net -770 ml  ? ?Filed Weights  ? 12/12/21 2302 12/13/21 0500 12/14/21 0221  ?Weight: 91.9 kg 91.3 kg 92.8 kg  ? ? ?Examination: ?Calm,  NAD ?Decreased breath sounds no wheezing ?Reg s1/s2 no gallop ?Soft benign +bs ?2-3+pitting edema ?Aaoxox3  ?Mood and affect appropriate in current setting  ? ? ? ?Data Reviewed: I have personally reviewed following labs and imaging studies ? ?CBC: ?Recent Labs  ?Lab 12/11/21 ?0653 12/12/21 ?0503 12/13/21 ?4765  ?WBC 9.1 9.4 6.9  ?NEUTROABS 7.2  --   --   ?HGB 10.6* 10.5* 10.2*  ?HCT 34.6* 34.7* 32.7*  ?MCV 85.4 85.5 83.2  ?PLT 182 184 189  ? ?Basic Metabolic Panel: ?Recent Labs  ?Lab 12/11/21 ?0653 12/12/21 ?0503 12/13/21 ?4650 12/14/21 ?3546  ?NA 134* 136 136 135  ?K 3.5 4.0 3.2* 3.3*  ?CL 98 98 102 103  ?CO2 20* 20* 22 22  ?GLUCOSE 133* 148* 120* 200*  ?BUN 67* 70* 67* 57*  ?CREATININE 2.17* 2.29* 2.11* 1.68*  ?CALCIUM 8.2* 8.5* 8.6* 8.5*  ? ?GFR: ?Estimated Creatinine Clearance: 29.6 mL/min (A) (by C-G formula based on SCr of 1.68 mg/dL (H)). ?Liver Function Tests: ?Recent Labs  ?Lab 12/11/21 ?0653 12/12/21 ?0503 12/13/21 ?5681  ?AST 109* 102* 66*  ?ALT 140* 151* 124*  ?ALKPHOS 249* 265* 265*  ?BILITOT 1.8* 2.3* 1.0  ?PROT 6.9 6.8 6.0*  ?ALBUMIN 3.7 3.7 3.3*  ? ?No results for input(s): LIPASE, AMYLASE in the last 168 hours. ?Recent Labs  ?Lab 12/11/21 ?1243  ?AMMONIA 49*  ? ?  Coagulation Profile: ?No results for input(s): INR, PROTIME in the last 168 hours. ?Cardiac Enzymes: ?No results for input(s): CKTOTAL, CKMB, CKMBINDEX, TROPONINI in the last 168 hours. ?BNP (last 3 results) ?No results for input(s): PROBNP in the last 8760 hours. ?HbA1C: ?No results for input(s): HGBA1C in the last 72 hours. ?CBG: ?Recent Labs  ?Lab 12/12/21 ?2109  ?GLUCAP 144*  ? ?Lipid Profile: ?No results for input(s): CHOL, HDL, LDLCALC, TRIG, CHOLHDL, LDLDIRECT in the last 72 hours. ?Thyroid Function Tests: ?No results for input(s): TSH, T4TOTAL, FREET4, T3FREE, THYROIDAB in the last 72 hours. ?Anemia Panel: ?No results for input(s): VITAMINB12, FOLATE, FERRITIN, TIBC, IRON, RETICCTPCT in the last 72 hours. ?Sepsis Labs: ?Recent Labs   ?Lab 12/11/21 ?0092  ?LATICACIDVEN 1.9  ? ? ?Recent Results (from the past 240 hour(s))  ?Resp Panel by RT-PCR (Flu A&B, Covid) Nasopharyngeal Swab     Status: None  ? Collection Time: 12/11/21  8:48 AM  ? Specimen: Nasopharyngeal Swab; Nasopharyngeal(NP) swabs in vial transport medium  ?Result Value Ref Range Status  ? SARS Coronavirus 2 by RT PCR NEGATIVE NEGATIVE Final  ?  Comment: (NOTE) ?SARS-CoV-2 target nucleic acids are NOT DETECTED. ? ?The SARS-CoV-2 RNA is generally detectable in upper respiratory ?specimens during the acute phase of infection. The lowest ?concentration of SARS-CoV-2 viral copies this assay can detect is ?138 copies/mL. A negative result does not preclude SARS-Cov-2 ?infection and should not be used as the sole basis for treatment or ?other patient management decisions. A negative result may occur with  ?improper specimen collection/handling, submission of specimen other ?than nasopharyngeal swab, presence of viral mutation(s) within the ?areas targeted by this assay, and inadequate number of viral ?copies(<138 copies/mL). A negative result must be combined with ?clinical observations, patient history, and epidemiological ?information. The expected result is Negative. ? ?Fact Sheet for Patients:  ?EntrepreneurPulse.com.au ? ?Fact Sheet for Healthcare Providers:  ?IncredibleEmployment.be ? ?This test is no t yet approved or cleared by the [place] FDA and  ?has been authorized for detection and/or diagnosis of SARS-CoV-2 by ?FDA under an Emergency Use Authorization (EUA). This EUA will remain  ?in effect (meaning this test can be used) for the duration of the ?COVID-19 declaration under Section 564(b)(1) of the Act, 21 ?U.S.C.section 360bbb-3(b)(1), unless the authorization is terminated  ?or revoked sooner.  ? ? ?  ? Influenza A by PCR NEGATIVE NEGATIVE Final  ? Influenza B by PCR NEGATIVE NEGATIVE Final  ?  Comment: (NOTE) ?The Xpert Xpress  SARS-CoV-2/FLU/RSV plus assay is intended as an aid ?in the diagnosis of influenza from Nasopharyngeal swab specimens and ?should not be used as a sole basis for treatment. Nasal washings and ?aspirates are unacceptable for Xpert Xpress SARS-CoV-2/FLU/RSV ?testing. ? ?Fact Sheet for Patients: ?EntrepreneurPulse.com.au ? ?Fact Sheet for Healthcare Providers: ?IncredibleEmployment.be ? ?This test is not yet approved or cleared by the [place] FDA and ?has been authorized for detection and/or diagnosis of SARS-CoV-2 by ?FDA under an Emergency Use Authorization (EUA). This EUA will remain ?in effect (meaning this test can be used) for the duration of the ?COVID-19 declaration under Section 564(b)(1) of the Act, 21 U.S.C. ?section 360bbb-3(b)(1), unless the authorization is terminated or ?revoked. ? ?Performed at [facility], [address], [place], [place] [address] ?  ?Urine Culture     Status: Abnormal  ? Collection Time: 12/11/21  9:20 AM  ? Specimen: Urine, Clean Catch  ?Result Value Ref Range Status  ? Specimen Description   Final  ?  URINE, CLEAN CATCH ?Performed at [facility], [address] [place], [place] [address] ?  ? Special Requests   Final  ?  URINE, CLEAN CATCH ?Performed at [facility], [address] [place], [place] [address] ?  ? Culture >=100,000 COLONIES/mL KLEBSIELLA PNEUMONIAE (A)  Final  ? Report Status 12/13/2021 FINAL  Final  ? Organism ID, Bacteria KLEBSIELLA PNEUMONIAE (A)  Final  ?    Susceptibility  ? Klebsiella pneumoniae - MIC*  ?  AMPICILLIN >=32 RESISTANT Resistant   ?  CEFAZOLIN <=4 SENSITIVE Sensitive   ?  CEFEPIME <=0.12 SENSITIVE Sensitive   ?  CEFTRIAXONE <=0.25 SENSITIVE Sensitive   ?  CIPROFLOXACIN <=0.25 SENSITIVE Sensitive   ?  GENTAMICIN <=1 SENSITIVE Sensitive   ?  IMIPENEM <=0.25 SENSITIVE Sensitive   ?  NITROFURANTOIN 64 INTERMEDIATE Intermediate   ?  TRIMETH/SULFA <=20 SENSITIVE Sensitive   ?  AMPICILLIN/SULBACTAM >=32 RESISTANT  Resistant   ?  PIP/TAZO <=4 SENSITIVE Sensitive   ?  * >=100,000 COLONIES/mL KLEBSIELLA PNEUMONIAE  ?MRSA Next Gen by PCR, Nasal     Status: None  ? Collection Time: 12/12/21  8:33 PM  ? Specimen: Nasal Mucos

## 2021-12-14 NOTE — Progress Notes (Signed)
ANTICOAGULATION CONSULT NOTE ? ?Pharmacy Consult for Heparin  ?Indication: atrial fibrillation ? ?Allergies  ?Allergen Reactions  ? Iohexol Anaphylaxis  ?   Desc: PT HAD CONTRAST REACTION YRS AGO NEEDS 13 HR PREP FOR ALL CONTRAST STUDIES ?  ? Codeine   ?  swelling  ? ? ?Patient Measurements: ?Height: '5\' 1"'$  (154.9 cm) ?Weight: 92.8 kg (204 lb 9.4 oz) ?IBW/kg (Calculated) : 47.8 ?Heparin Dosing Weight: 70kg  ? ?Vital Signs: ?Temp: 97.5 ?F (36.4 ?C) (03/20 1626) ?Temp Source: Oral (03/20 1626) ?BP: 107/34 (03/20 1626) ?Pulse Rate: 70 (03/20 1626) ? ?Labs: ?Recent Labs  ?  12/12/21 ?0503 12/13/21 ?4696 12/14/21 ?2952 12/14/21 ?1555  ?HGB 10.5* 10.2*  --   --   ?HCT 34.7* 32.7*  --   --   ?PLT 184 189  --   --   ?APTT  --   --   --  61*  ?HEPARINUNFRC  --   --   --  0.22*  ?CREATININE 2.29* 2.11* 1.68*  --   ? ? ? ?Estimated Creatinine Clearance: 29.6 mL/min (A) (by C-G formula based on SCr of 1.68 mg/dL (H)). ? ? ?Medical History: ?Past Medical History:  ?Diagnosis Date  ? Anxiety   ? Arthritis   ? "all over" (07/29/2017)  ? Bulging disc   ? "still have one in my neck" (07/29/2017)  ? Carotid arterial disease ([place])   ? a. s/p L intracranial ICA stenting ([place]).  ? Cataract   ? "left eye; didn't take it out cause of macular degeneration" (07/29/2017)  ? Cervical cancer (HCC)   ? hx  ? Chronic kidney disease, stage III (moderate) (HCC)   ? "only have 1 working kidney" (07/29/2017)  ? COPD (chronic obstructive pulmonary disease) ([place])   ? a. quit smoking in 2007.  ? Coronary artery disease   ? a. 2007 s/p MI and LAD stenting;  10/2006 CABG x 4: LIMA->LAD, VG->OM->dLCX, VG->RPL;  c. 07/2013 MV: no ischemia.  ? Depression   ? Esophageal reflux   ? Hepatitis 1968  ? "don't remember which one"  ? History of blood transfusion 10/29/2006  ? "related to bypass OR"  ? History of echocardiogram   ? a. 01/2012 Echo: EF 55%, mildly dil LA/RV, mild MR, Ao Sclerosis, mod TR.  ? Hypothyroid   ? Insomnia   ? Macular degeneration   ? "left  eye" (07/29/2017)  ? Mixed hyperlipidemia   ? NSTEMI (non-ST elevated myocardial infarction) ([place]) 08/2006; 10/2006  ? Osteoporosis   ? Pneumonia   ? "several times before I quit smoking" (07/29/2017)  ? Presence of permanent cardiac pacemaker   ? PVT (paroxysmal ventricular tachycardia)   ? Restless leg syndrome   ? Stroke [facility]) ?2008  ? denies residual on 07/29/2017  ? Symptomatic bradycardia   ? a. s/p BSX Advantio dual chamber PPM Beckie Salts).  ? Syncope and collapse   ? Unspecified hearing loss   ? ? ? ? ?Assessment: ?76yof admitted s/p fall  - head CT negative for bleed, VQ negative for PE.  Hx Afib on apixaban pta - held on admit (LD 3/17) > CBC stable > resume anticoagulation.  Pharmacy asked to dose heparin while apixaban on hold for upcoming procedures.   ? ?Apixaban falsely elevates heparin level- will dose heparin with aPTT until apixaban cleared and values correlate.   ? ?Initial heparin level subtherapeutic at 0.22, aPTT also slightly subtherapeutic at 61 - correlating on heparin at 900 units/hr. No s/sx of bleeding  or infusion issues.  ? ?Goal of Therapy:  ?Heparin level 0.3-0.7 units/ml ?APTT 66-103 sec  ?Monitor platelets by anticoagulation protocol: Yes ?  ?Plan:  ?Increase heparin infusion to 1050 units/hr  ?Order heparin level in 8 hr with AM labs  ?Daily Heparin level and CBC  ?Monitor s/s bleeding  ? ?Antonietta Jewel, PharmD, BCCCP ?Clinical Pharmacist  ?Phone: [phone] ?12/14/2021 4:55 PM ? ?Please check AMION for all [place] phone numbers ?After 10:00 PM, call [place] [phone] ?  ? ? ?

## 2021-12-15 ENCOUNTER — Other Ambulatory Visit (HOSPITAL_COMMUNITY): Payer: Self-pay

## 2021-12-15 DIAGNOSIS — N179 Acute kidney failure, unspecified: Secondary | ICD-10-CM | POA: Diagnosis not present

## 2021-12-15 DIAGNOSIS — N390 Urinary tract infection, site not specified: Secondary | ICD-10-CM | POA: Diagnosis not present

## 2021-12-15 DIAGNOSIS — E039 Hypothyroidism, unspecified: Secondary | ICD-10-CM

## 2021-12-15 DIAGNOSIS — R7989 Other specified abnormal findings of blood chemistry: Secondary | ICD-10-CM | POA: Diagnosis not present

## 2021-12-15 DIAGNOSIS — I50811 Acute right heart failure: Secondary | ICD-10-CM | POA: Diagnosis not present

## 2021-12-15 LAB — COMPREHENSIVE METABOLIC PANEL
ALT: 73 U/L — ABNORMAL HIGH (ref 0–44)
AST: 29 U/L (ref 15–41)
Albumin: 3.2 g/dL — ABNORMAL LOW (ref 3.5–5.0)
Alkaline Phosphatase: 201 U/L — ABNORMAL HIGH (ref 38–126)
Anion gap: 9 (ref 5–15)
BUN: 33 mg/dL — ABNORMAL HIGH (ref 8–23)
CO2: 29 mmol/L (ref 22–32)
Calcium: 8 mg/dL — ABNORMAL LOW (ref 8.9–10.3)
Chloride: 100 mmol/L (ref 98–111)
Creatinine, Ser: 1.11 mg/dL — ABNORMAL HIGH (ref 0.44–1.00)
GFR, Estimated: 52 mL/min — ABNORMAL LOW (ref >60)
Glucose, Bld: 142 mg/dL — ABNORMAL HIGH (ref 70–99)
Potassium: 3 mmol/L — ABNORMAL LOW (ref 3.5–5.1)
Sodium: 138 mmol/L (ref 135–145)
Total Bilirubin: 1.2 mg/dL (ref 0.3–1.2)
Total Protein: 6 g/dL — ABNORMAL LOW (ref 6.5–8.1)

## 2021-12-15 LAB — CBC
HCT: 34.7 % — ABNORMAL LOW (ref 36.0–46.0)
Hemoglobin: 10.5 g/dL — ABNORMAL LOW (ref 12.0–15.0)
MCH: 25 pg — ABNORMAL LOW (ref 26.0–34.0)
MCHC: 30.3 g/dL (ref 30.0–36.0)
MCV: 82.6 fL (ref 80.0–100.0)
Platelets: 172 10*3/uL (ref 150–400)
RBC: 4.2 MIL/uL (ref 3.87–5.11)
RDW: 20.2 % — ABNORMAL HIGH (ref 11.5–15.5)
WBC: 7.3 10*3/uL (ref 4.0–10.5)
nRBC: 0 % (ref 0.0–0.2)

## 2021-12-15 LAB — COOXEMETRY PANEL
Carboxyhemoglobin: 1.5 % (ref 0.5–1.5)
Methemoglobin: 1.2 % (ref 0.0–1.5)
O2 Saturation: 64.4 %
Total hemoglobin: 10.4 g/dL — ABNORMAL LOW (ref 12.0–16.0)

## 2021-12-15 LAB — HEPARIN LEVEL (UNFRACTIONATED)
Heparin Unfractionated: 0.5 IU/mL (ref 0.30–0.70)
Heparin Unfractionated: 0.54 IU/mL (ref 0.30–0.70)

## 2021-12-15 LAB — MAGNESIUM: Magnesium: 1.9 mg/dL (ref 1.7–2.4)

## 2021-12-15 MED ORDER — RISAQUAD PO CAPS
1.0000 | ORAL_CAPSULE | Freq: Every day | ORAL | Status: DC
  Administered 2021-12-15 – 2021-12-18 (×4): 1 via ORAL
  Filled 2021-12-15 (×4): qty 1

## 2021-12-15 MED ORDER — SODIUM CHLORIDE 0.9 % IV SOLN
250.0000 mL | INTRAVENOUS | Status: DC | PRN
Start: 2021-12-15 — End: 2021-12-16

## 2021-12-15 MED ORDER — POTASSIUM CHLORIDE 10 MEQ/50ML IV SOLN
10.0000 meq | INTRAVENOUS | Status: AC
  Administered 2021-12-15 (×4): 10 meq via INTRAVENOUS
  Filled 2021-12-15 (×4): qty 50

## 2021-12-15 MED ORDER — ASPIRIN 81 MG PO CHEW
81.0000 mg | CHEWABLE_TABLET | ORAL | Status: AC
  Administered 2021-12-16: 81 mg via ORAL
  Filled 2021-12-15: qty 1

## 2021-12-15 MED ORDER — SODIUM CHLORIDE 0.9 % IV SOLN: INTRAVENOUS | Status: DC

## 2021-12-15 MED ORDER — MAGNESIUM SULFATE 2 GM/50ML IV SOLN
2.0000 g | Freq: Once | INTRAVENOUS | Status: AC
  Administered 2021-12-15: 2 g via INTRAVENOUS
  Filled 2021-12-15: qty 50

## 2021-12-15 MED ORDER — SODIUM CHLORIDE 0.9% FLUSH: 3.0000 mL | INTRAVENOUS | Status: DC | PRN

## 2021-12-15 MED ORDER — POTASSIUM CHLORIDE CRYS ER 20 MEQ PO TBCR
40.0000 meq | EXTENDED_RELEASE_TABLET | Freq: Once | ORAL | Status: AC
  Administered 2021-12-15: 40 meq via ORAL
  Filled 2021-12-15: qty 2

## 2021-12-15 MED ORDER — SODIUM CHLORIDE 0.9 % IV SOLN
1.0000 g | INTRAVENOUS | Status: AC
  Administered 2021-12-15 – 2021-12-16 (×2): 1 g via INTRAVENOUS
  Filled 2021-12-15 (×3): qty 10

## 2021-12-15 NOTE — Progress Notes (Signed)
ANTICOAGULATION CONSULT NOTE ? ?Pharmacy Consult for Heparin  ?Indication: atrial fibrillation ? ?Allergies  ?Allergen Reactions  ? Iohexol Anaphylaxis  ?   Desc: PT HAD CONTRAST REACTION YRS AGO NEEDS 13 HR PREP FOR ALL CONTRAST STUDIES ?  ? Codeine   ?  swelling  ? ? ?Patient Measurements: ?Height: '5\' 1"'$  (154.9 cm) ?Weight: 87.4 kg (192 lb 11.2 oz) ?IBW/kg (Calculated) : 47.8 ?Heparin Dosing Weight: 70kg  ? ?Vital Signs: ?Temp: 97.6 ?F (36.4 ?C) (03/21 5102) ?Temp Source: Oral (03/21 1058) ?BP: 124/62 (03/21 1058) ?Pulse Rate: 66 (03/21 1058) ? ?Labs: ?Recent Labs  ?  12/13/21 ?0307 12/14/21 ?0313 12/14/21 ?1555 12/15/21 ?5852 12/15/21 ?1114 12/15/21 ?1241  ?HGB 10.2*  --   --   --  10.5*  --   ?HCT 32.7*  --   --   --  34.7*  --   ?PLT 189  --   --   --  172  --   ?APTT  --   --  61*  --   --   --   ?HEPARINUNFRC  --   --  0.22* 0.50  --  0.54  ?CREATININE 2.11* 1.68*  --  1.11*  --   --   ? ? ? ?Estimated Creatinine Clearance: 43.3 mL/min (A) (by C-G formula based on SCr of 1.11 mg/dL (H)). ? ? ?Medical History: ?Past Medical History:  ?Diagnosis Date  ? Anxiety   ? Arthritis   ? "all over" (07/29/2017)  ? Bulging disc   ? "still have one in my neck" (07/29/2017)  ? Carotid arterial disease ([place])   ? a. s/p L intracranial ICA stenting ([place]).  ? Cataract   ? "left eye; didn't take it out cause of macular degeneration" (07/29/2017)  ? Cervical cancer (HCC)   ? hx  ? Chronic kidney disease, stage III (moderate) (HCC)   ? "only have 1 working kidney" (07/29/2017)  ? COPD (chronic obstructive pulmonary disease) ([place])   ? a. quit smoking in 2007.  ? Coronary artery disease   ? a. 2007 s/p MI and LAD stenting;  10/2006 CABG x 4: LIMA->LAD, VG->OM->dLCX, VG->RPL;  c. 07/2013 MV: no ischemia.  ? Depression   ? Esophageal reflux   ? Hepatitis 1968  ? "don't remember which one"  ? History of blood transfusion 10/29/2006  ? "related to bypass OR"  ? History of echocardiogram   ? a. 01/2012 Echo: EF 55%, mildly dil LA/RV,  mild MR, Ao Sclerosis, mod TR.  ? Hypothyroid   ? Insomnia   ? Macular degeneration   ? "left eye" (07/29/2017)  ? Mixed hyperlipidemia   ? NSTEMI (non-ST elevated myocardial infarction) ([place]) 08/2006; 10/2006  ? Osteoporosis   ? Pneumonia   ? "several times before I quit smoking" (07/29/2017)  ? Presence of permanent cardiac pacemaker   ? PVT (paroxysmal ventricular tachycardia)   ? Restless leg syndrome   ? Stroke [facility]) ?2008  ? denies residual on 07/29/2017  ? Symptomatic bradycardia   ? a. s/p BSX Advantio dual chamber PPM Beckie Salts).  ? Syncope and collapse   ? Unspecified hearing loss   ? ? ? ? ?Assessment: ?76yof admitted s/p fall  - head CT negative for bleed, VQ negative for PE.  Hx Afib on apixaban pta - held on admit (LD 3/17) > CBC stable > resume anticoagulation.  Pharmacy asked to dose heparin while apixaban on hold for upcoming procedures.   ? ?aPTT levels and heparin levels correlating  as off 3/20 PM check.  ? ?Heparin level this afternoon therapeutic at 0.54 on 1050 units/hr. CBC stable and wnl. No issues with bleeding per RN. Can move to daily checks.  ? ?Goal of Therapy:  ?Heparin level 0.3-0.7 units/ml ?APTT 66-103 sec  ?Monitor platelets by anticoagulation protocol: Yes ?  ?Plan:  ?Continue heparin infusion at 1050 units/hr  ?Daily Heparin level and CBC  ?Monitor s/s bleeding  ? ?Cathrine Muster, PharmD ?PGY2 Cardiology Pharmacy Resident ?Phone: [phone] ?12/15/2021  1:26 PM ? ?Please check [email or web address] for unit-specific pharmacy phone numbers. ? ?  ? ? ?

## 2021-12-15 NOTE — TOC Progression Note (Addendum)
Transition of Care (TOC) - Progression Note  ? ? ?Patient Details  ?Name: [name] ?MRN: [id number] ?Date of Birth: [birth year] ? ?Transition of Care (TOC) CM/SW Contact  ?Yun Gutierrez, LCSW ?Phone Number: ?12/15/2021, 2:59 PM ? ?Clinical Narrative:    ?Patient's first choice is [facility] for SNF. HF CSW left a voicemail for Marianna Fuss at [facility] for her to return the call. [facility] is patient's [facility] choice. RHC and TEE planned for tomorrow. ?3:33pm- HF CSW received a call back from Ashley at [facility] reporting that she does have a bed for [name] at time of DC as long as she is not on any drips at time of DC. ? ?CSW will continue to follow throughout discharge. ? ?Expected Discharge Plan: [place] ?Barriers to Discharge: Continued Medical Work up ? ?Expected Discharge Plan and Services ?Expected Discharge Plan: [place] ?  ?  ?  ?Living arrangements for the past 2 months: Apartment (Retirement home) ?                ?  ?  ?  ?  ?  ?  ?  ?  ?  ?  ? ? ?Social Determinants of Health (SDOH) Interventions ?  ? ?Readmission Risk Interventions ?No flowsheet data found. ? ?

## 2021-12-15 NOTE — Progress Notes (Signed)
Orthopedic Tech Progress Note ?Patient Details:  ?[name] ?1946-08-17 ?[id number] ? ?Ortho Devices ?Type of Ortho Device: Unna boot ?Ortho Device/Splint Location: BLE ?Ortho Device/Splint Interventions: Ordered, Application ?  ?Post Interventions ?Patient Tolerated: Well ?Instructions Provided: Care of device ? ?Janit Pagan ?12/15/2021, 3:11 PM ? ?

## 2021-12-15 NOTE — Consult Note (Signed)
[facility] Nurse Consult Note: ?Discussed with Bedside RN , who is a Wound Treatment Associate (WTA), Florina Ou, the order for [employer].  Those were placed by Ortho Tech today and are scheduled for twice weekly changes. ? ?No need for [facility] Nurse consult for Unna's Boots. ? ?[facility] nursing team will not follow, but will remain available to this patient, the nursing and medical teams.  Please re-consult if needed. ?Thanks, ?Maudie Flakes, MSN, RN, [place], [place], CWON-AP, [place]  ?Pager# [phone]  ? ? ?  ?

## 2021-12-15 NOTE — Progress Notes (Addendum)
ANTICOAGULATION CONSULT NOTE ? ?Pharmacy Consult for Heparin  ?Indication: atrial fibrillation ? ?Allergies  ?Allergen Reactions  ? Iohexol Anaphylaxis  ?   Desc: PT HAD CONTRAST REACTION YRS AGO NEEDS 13 HR PREP FOR ALL CONTRAST STUDIES ?  ? Codeine   ?  swelling  ? ? ?Patient Measurements: ?Height: '5\' 1"'$  (154.9 cm) ?Weight: 87.4 kg (192 lb 11.2 oz) ?IBW/kg (Calculated) : 47.8 ?Heparin Dosing Weight: 70kg  ? ?Vital Signs: ?Temp: 97.6 ?F (36.4 ?C) (03/21 4650) ?Temp Source: Oral (03/21 3546) ?BP: 136/47 (03/21 5681) ?Pulse Rate: 63 (03/21 0828) ? ?Labs: ?Recent Labs  ?  12/13/21 ?0307 12/14/21 ?0313 12/14/21 ?1555 12/15/21 ?2751  ?HGB 10.2*  --   --   --   ?HCT 32.7*  --   --   --   ?PLT 189  --   --   --   ?APTT  --   --  61*  --   ?HEPARINUNFRC  --   --  0.22* 0.50  ?CREATININE 2.11* 1.68*  --  1.11*  ? ? ? ?Estimated Creatinine Clearance: 43.3 mL/min (A) (by C-G formula based on SCr of 1.11 mg/dL (H)). ? ? ?Medical History: ?Past Medical History:  ?Diagnosis Date  ? Anxiety   ? Arthritis   ? "all over" (07/29/2017)  ? Bulging disc   ? "still have one in my neck" (07/29/2017)  ? Carotid arterial disease ([place])   ? a. s/p L intracranial ICA stenting ([place]).  ? Cataract   ? "left eye; didn't take it out cause of macular degeneration" (07/29/2017)  ? Cervical cancer (HCC)   ? hx  ? Chronic kidney disease, stage III (moderate) (HCC)   ? "only have 1 working kidney" (07/29/2017)  ? COPD (chronic obstructive pulmonary disease) ([place])   ? a. quit smoking in 2007.  ? Coronary artery disease   ? a. 2007 s/p MI and LAD stenting;  10/2006 CABG x 4: LIMA->LAD, VG->OM->dLCX, VG->RPL;  c. 07/2013 MV: no ischemia.  ? Depression   ? Esophageal reflux   ? Hepatitis 1968  ? "don't remember which one"  ? History of blood transfusion 10/29/2006  ? "related to bypass OR"  ? History of echocardiogram   ? a. 01/2012 Echo: EF 55%, mildly dil LA/RV, mild MR, Ao Sclerosis, mod TR.  ? Hypothyroid   ? Insomnia   ? Macular degeneration   ? "left  eye" (07/29/2017)  ? Mixed hyperlipidemia   ? NSTEMI (non-ST elevated myocardial infarction) ([place]) 08/2006; 10/2006  ? Osteoporosis   ? Pneumonia   ? "several times before I quit smoking" (07/29/2017)  ? Presence of permanent cardiac pacemaker   ? PVT (paroxysmal ventricular tachycardia)   ? Restless leg syndrome   ? Stroke [facility]) ?2008  ? denies residual on 07/29/2017  ? Symptomatic bradycardia   ? a. s/p BSX Advantio dual chamber PPM Beckie Salts).  ? Syncope and collapse   ? Unspecified hearing loss   ? ? ? ? ?Assessment: ?76yof admitted s/p fall  - head CT negative for bleed, VQ negative for PE.  Hx Afib on apixaban pta - held on admit (LD 3/17) > CBC stable > resume anticoagulation.  Pharmacy asked to dose heparin while apixaban on hold for upcoming procedures.   ? ?aPTT levels and heparin levels correlating as off 3/20 PM check.  ? ?Heparin level this morning therapeutic at 0.5 on 1050 units/hr. CBC in process. However, no issues with bleeding per RN.  ? ?Goal of Therapy:  ?  Heparin level 0.3-0.7 units/ml ?APTT 66-103 sec  ?Monitor platelets by anticoagulation protocol: Yes ?  ?Plan:  ?Continue heparin infusion at 1050 units/hr  ?Confirmatory heparin level in 6 hours ?Daily Heparin level and CBC  ?Monitor s/s bleeding  ? ?Cathrine Muster, PharmD ?PGY2 Cardiology Pharmacy Resident ?Phone: [phone] ?12/15/2021  9:29 AM ? ?Please check [email or web address] for unit-specific pharmacy phone numbers. ? ?  ? ? ?

## 2021-12-15 NOTE — H&P (View-Only) (Signed)
? ? Advanced Heart Failure Rounding Note ? ?PCP-Cardiologist: None  ? ?Subjective:   ?3/19 Continued on milrinone and started on lasix drip.  ?3/20 started on po amio to suppress PVCs. PICC placed. Diuresed with lasix drip.  ? ?Remains on milrinone 0.25 mcg. CO-OX 64%.  ? ?Complaining of mild headache. Denies SOB.  ? ?Objective:   ?Weight Range: ?87.4 kg ?Body mass index is 36.41 kg/m?.  ? ?Vital Signs:   ?Temp:  [97.4 ?F (36.3 ?C)-97.6 ?F (36.4 ?C)] 97.6 ?F (36.4 ?C) (03/21 2505) ?Pulse Rate:  [63-70] 63 (03/21 0828) ?Resp:  [13-28] 18 (03/21 3976) ?BP: (104-151)/(34-64) 136/47 (03/21 7341) ?SpO2:  [93 %-96 %] 95 % (03/21 0828) ?Weight:  [87.4 kg] 87.4 kg (03/21 0449) ?Last BM Date : 12/14/21 ? ?Weight change: ?Filed Weights  ? 12/13/21 0500 12/14/21 0221 12/15/21 0449  ?Weight: 91.3 kg 92.8 kg 87.4 kg  ? ? ?Intake/Output:  ? ?Intake/Output Summary (Last 24 hours) at 12/15/2021 0850 ?Last data filed at 12/15/2021 9379 ?Gross per 24 hour  ?Intake 1170.87 ml  ?Output 5350 ml  ?Net -4179.13 ml  ?  ? ? ?Physical Exam  ?CVP 8-9 ?General:  Appears weak.  No resp difficulty ?HEENT: forehead/R periorbital ecchymotic ?Neck: supple. JVP 8-9 . Carotids 2+ bilat; no bruits. No lymphadenopathy or thryomegaly appreciated. ?Cor: PMI nondisplaced. Regular rate & rhythm. No rubs, gallops . 2/6 TR . ?Lungs: clear ?Abdomen: soft, nontender, nondistended. No hepatosplenomegaly. No bruits or masses. Good bowel sounds. ?Extremities: no cyanosis, clubbing, rash, edema. RUE PICC  ?Neuro: alert & orientedx3, cranial nerves grossly intact. moves all 4 extremities w/o difficulty. Affect pleasant ? ?Telemetry  ? ?A paced 50-60s rare PVC.  ? ?EKG  ?  ?N/A ? ?Labs  ?  ?CBC ?Recent Labs  ?  12/13/21 ?0307  ?WBC 6.9  ?HGB 10.2*  ?HCT 32.7*  ?MCV 83.2  ?PLT 189  ? ?Basic Metabolic Panel ?Recent Labs  ?  12/14/21 ?0313 12/15/21 ?0240  ?NA 135 138  ?K 3.3* 3.0*  ?CL 103 100  ?CO2 22 29  ?GLUCOSE 200* 142*  ?BUN 57* 33*  ?CREATININE 1.68* 1.11*   ?CALCIUM 8.5* 8.0*  ?MG  --  1.9  ? ?Liver Function Tests ?Recent Labs  ?  12/13/21 ?0307 12/15/21 ?9735  ?AST 66* 29  ?ALT 124* 73*  ?ALKPHOS 265* 201*  ?BILITOT 1.0 1.2  ?PROT 6.0* 6.0*  ?ALBUMIN 3.3* 3.2*  ? ?No results for input(s): LIPASE, AMYLASE in the last 72 hours. ?Cardiac Enzymes ?No results for input(s): CKTOTAL, CKMB, CKMBINDEX, TROPONINI in the last 72 hours. ? ?BNP: ?BNP (last 3 results) ?Recent Labs  ?  12/11/21 ?0653  ?BNP 1,055.0*  ? ? ?ProBNP (last 3 results) ?No results for input(s): PROBNP in the last 8760 hours. ? ? ?D-Dimer ?No results for input(s): DDIMER in the last 72 hours. ?Hemoglobin A1C ?No results for input(s): HGBA1C in the last 72 hours. ?Fasting Lipid Panel ?No results for input(s): CHOL, HDL, LDLCALC, TRIG, CHOLHDL, LDLDIRECT in the last 72 hours. ?Thyroid Function Tests ?No results for input(s): TSH, T4TOTAL, T3FREE, THYROIDAB in the last 72 hours. ? ?Invalid input(s): FREET3 ? ?Other results: ? ? ?Imaging  ? ? ?DG CHEST PORT 1 VIEW ? ?Result Date: 12/14/2021 ?CLINICAL DATA:  Post PICC line. EXAM: PORTABLE CHEST 1 VIEW COMPARISON:  Chest x-ray 12/12/2021. FINDINGS: Right-sided PICC is present with distal catheter tip projecting over the SVC. There is no pneumothorax. Patient is status post cardiac surgery. Left-sided pacemaker is again  seen. The cardiac silhouette is enlarged. There is no focal lung infiltrate or pleural effusion. There is a calcified nodule in the right lung base. Cervical spinal fusion plate is present. IMPRESSION: 1. Right-sided PICC line tip at the level of the SVC. 2. Cardiomegaly is stable. 3. No acute cardiopulmonary process. Electronically Signed   By: Ronney Asters M.D.   On: 12/14/2021 21:50   ? ? ?Medications:   ? ? ?Scheduled Medications: ? acidophilus  1 capsule Oral Daily  ? amiodarone  200 mg Oral BID  ? Chlorhexidine Gluconate Cloth  6 each Topical Daily  ? diclofenac Sodium  4 g Topical QID  ? fluticasone furoate-vilanterol  1 puff Inhalation  Daily  ? And  ? umeclidinium bromide  1 puff Inhalation Daily  ? gabapentin  300 mg Oral TID  ? Gerhardt's butt cream   Topical BID  ? levothyroxine  75 mcg Oral Q0600  ? Oxcarbazepine  300 mg Oral BID  ? pantoprazole  40 mg Oral QHS  ? potassium chloride  40 mEq Oral BID  ? pramipexole  1 mg Oral QHS  ? sertraline  100 mg Oral Daily  ? sodium chloride flush  10-40 mL Intracatheter Q12H  ? sodium chloride flush  3 mL Intravenous Q12H  ? spironolactone  25 mg Oral Daily  ? ? ?Infusions: ? sodium chloride 20 mL/hr at 12/15/21 3086  ? ceFEPime (MAXIPIME) IV 2 g (12/14/21 2142)  ? furosemide (LASIX) 200 mg in dextrose 5% 100 mL ('2mg'$ /mL) infusion 15 mg/hr (12/15/21 5784)  ? heparin 1,050 Units/hr (12/15/21 0825)  ? magnesium sulfate bolus IVPB 2 g (12/15/21 0835)  ? milrinone 0.25 mcg/kg/min (12/15/21 6962)  ? potassium chloride    ? ? ?PRN Medications: ?acetaminophen **OR** acetaminophen, ondansetron **OR** ondansetron (ZOFRAN) IV, polyvinyl alcohol, sodium chloride flush ? ? ? ?Patient Profile  ? ?  ?Ms. Defranco is a 76 y.o. 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 Lovena Le 2014), chronic diastolic HF.   ? ?Transferred from [facility] to [facility] for HF consultation.   ? ?Assessment/Plan  ?1. 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 D-shaped septum. No significant PAH with TR tracing ?- suspect primary issue here is RV dilation with severe TR either due to longstanding severe PAH in setting of OSA/OHS/COPD (but PAH mild on previous echos dating back to 2013) or leaflet tethering from pacer leads ?- Continue milrinone to augment diuresis. Renal function improving. CO-OX stable.  ?- CVP 9. Will cut back lasix 8 mg per hour. Anticipate stopping in am. Supp K.  ?- Place UNNA boots ?- will need RHC and TEE once more diuresed Consider TV clip  ?   ?2. CAD s/p CABG ?- no chest pain.  ?- off ASA with DOAC. Off statin with shock liver ?  ?3. AKI on CKD 3a ?- baseline SCr 1.3-1.5 ?- has scarred R kidney on u/s ?- SCr peaked 2.3-->today 1.1 ?- Likely due to ATN from shock ?- Continue hemodynamic support ?  ?4. 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   ?- Continue milrinone and diuresis ?  ?5. PAF with tachy-brady syndrome ?- in NSR ?- Apixaban currently on hold.  ?- ? Add heparin drip ?  ?6. Hypokalemia ?-Supp  K  and getting K runs.  ? ? 7. Fall with head trauma ?- Head CT negative for bleed ?  ?8. PVCs ?-High PVC burden. 3/20 Started on amio 200 mg twice a day to help suppress.  ?- PVC burden significantly lower today. Continue  ?- Discussed with pharmacy.    ? ? ?9. DNR/DNI ?  ?Set up TEE/RHC Wed -- orders placed  ? ?Consult PT.  ? ?Length of Stay: 4 ? ?Darrick Grinder, NP  ?12/15/2021, 8:50 AM ? ?Advanced Heart Failure Team ?Pager [phone] (M-F; 7a - 5p)  ?Please contact [facility] for night-coverage after hours (5p -7a ) and weekends on [email or web address] ? ?Patient seen with NP, agree with the above note.  ? ?Good diuresis yesterday on Lasix 15 mg/hr and milrinone 0.25 for RV support.  Co-ox 64% today, CVP 8-9.  ? ?Patient fell prior to admission, has large peri-orbital ecchymosis.  ? ?General: NAD ?Neck: JVP 9-10 cm, no thyromegaly or thyroid nodule.  ?Lungs: Clear to auscultation bilaterally with normal respiratory effort. ?CV: Nondisplaced PMI.  Heart regular S1/S2, no S3/S4, 2/6 HSM LLSB.  No edema.  No carotid bruit.  Normal pedal pulses.  ?Abdomen: Soft, nontender, no hepatosplenomegaly, no distention.  ?Skin: Intact without lesions or rashes.  ?Neurologic: Alert and oriented x 3.  ?Psych: Normal affect. ?Extremities: No clubbing or cyanosis.  ?HEENT: Peri-orbital ecchymosis on right.  ? ?Heparin gtt given PAF.  ? ?RV failure.  Volume improved with CVP 8-9.  Decrease milrinone to 0.125 today and  decrease Lasix gtt to 8 mg/hr.  Suspect we can stop tomorrow.  ? ?Will set up for [place] for tomorrow as well as TEE to assess the tricuspid valve (?candidate for percutaneous repair). Discussed risks/benefits with patie

## 2021-12-15 NOTE — Progress Notes (Addendum)
? ? Advanced Heart Failure Rounding Note ? ?PCP-Cardiologist: None  ? ?Subjective:   ?3/19 Continued on milrinone and started on lasix drip.  ?3/20 started on po amio to suppress PVCs. PICC placed. Diuresed with lasix drip.  ? ?Remains on milrinone 0.25 mcg. CO-OX 64%.  ? ?Complaining of mild headache. Denies SOB.  ? ?Objective:   ?Weight Range: ?87.4 kg ?Body mass index is 36.41 kg/m?.  ? ?Vital Signs:   ?Temp:  [97.4 ?F (36.3 ?C)-97.6 ?F (36.4 ?C)] 97.6 ?F (36.4 ?C) (03/21 1594) ?Pulse Rate:  [63-70] 63 (03/21 0828) ?Resp:  [13-28] 18 (03/21 5859) ?BP: (104-151)/(34-64) 136/47 (03/21 2924) ?SpO2:  [93 %-96 %] 95 % (03/21 0828) ?Weight:  [87.4 kg] 87.4 kg (03/21 0449) ?Last BM Date : 12/14/21 ? ?Weight change: ?Filed Weights  ? 12/13/21 0500 12/14/21 0221 12/15/21 0449  ?Weight: 91.3 kg 92.8 kg 87.4 kg  ? ? ?Intake/Output:  ? ?Intake/Output Summary (Last 24 hours) at 12/15/2021 0850 ?Last data filed at 12/15/2021 4628 ?Gross per 24 hour  ?Intake 1170.87 ml  ?Output 5350 ml  ?Net -4179.13 ml  ?  ? ? ?Physical Exam  ?CVP 8-9 ?General:  Appears weak.  No resp difficulty ?HEENT: forehead/R periorbital ecchymotic ?Neck: supple. JVP 8-9 . Carotids 2+ bilat; no bruits. No lymphadenopathy or thryomegaly appreciated. ?Cor: PMI nondisplaced. Regular rate & rhythm. No rubs, gallops . 2/6 TR . ?Lungs: clear ?Abdomen: soft, nontender, nondistended. No hepatosplenomegaly. No bruits or masses. Good bowel sounds. ?Extremities: no cyanosis, clubbing, rash, edema. RUE PICC  ?Neuro: alert & orientedx3, cranial nerves grossly intact. moves all 4 extremities w/o difficulty. Affect pleasant ? ?Telemetry  ? ?A paced 50-60s rare PVC.  ? ?EKG  ?  ?N/A ? ?Labs  ?  ?CBC ?Recent Labs  ?  12/13/21 ?0307  ?WBC 6.9  ?HGB 10.2*  ?HCT 32.7*  ?MCV 83.2  ?PLT 189  ? ?Basic Metabolic Panel ?Recent Labs  ?  12/14/21 ?0313 12/15/21 ?6381  ?NA 135 138  ?K 3.3* 3.0*  ?CL 103 100  ?CO2 22 29  ?GLUCOSE 200* 142*  ?BUN 57* 33*  ?CREATININE 1.68* 1.11*   ?CALCIUM 8.5* 8.0*  ?MG  --  1.9  ? ?Liver Function Tests ?Recent Labs  ?  12/13/21 ?0307 12/15/21 ?7711  ?AST 66* 29  ?ALT 124* 73*  ?ALKPHOS 265* 201*  ?BILITOT 1.0 1.2  ?PROT 6.0* 6.0*  ?ALBUMIN 3.3* 3.2*  ? ?No results for input(s): LIPASE, AMYLASE in the last 72 hours. ?Cardiac Enzymes ?No results for input(s): CKTOTAL, CKMB, CKMBINDEX, TROPONINI in the last 72 hours. ? ?BNP: ?BNP (last 3 results) ?Recent Labs  ?  12/11/21 ?0653  ?BNP 1,055.0*  ? ? ?ProBNP (last 3 results) ?No results for input(s): PROBNP in the last 8760 hours. ? ? ?D-Dimer ?No results for input(s): DDIMER in the last 72 hours. ?Hemoglobin A1C ?No results for input(s): HGBA1C in the last 72 hours. ?Fasting Lipid Panel ?No results for input(s): CHOL, HDL, LDLCALC, TRIG, CHOLHDL, LDLDIRECT in the last 72 hours. ?Thyroid Function Tests ?No results for input(s): TSH, T4TOTAL, T3FREE, THYROIDAB in the last 72 hours. ? ?Invalid input(s): FREET3 ? ?Other results: ? ? ?Imaging  ? ? ?DG CHEST PORT 1 VIEW ? ?Result Date: 12/14/2021 ?CLINICAL DATA:  Post PICC line. EXAM: PORTABLE CHEST 1 VIEW COMPARISON:  Chest x-ray 12/12/2021. FINDINGS: Right-sided PICC is present with distal catheter tip projecting over the SVC. There is no pneumothorax. Patient is status post cardiac surgery. Left-sided pacemaker is again  seen. The cardiac silhouette is enlarged. There is no focal lung infiltrate or pleural effusion. There is a calcified nodule in the right lung base. Cervical spinal fusion plate is present. IMPRESSION: 1. Right-sided PICC line tip at the level of the SVC. 2. Cardiomegaly is stable. 3. No acute cardiopulmonary process. Electronically Signed   By: Ronney Asters M.D.   On: 12/14/2021 21:50   ? ? ?Medications:   ? ? ?Scheduled Medications: ? acidophilus  1 capsule Oral Daily  ? amiodarone  200 mg Oral BID  ? Chlorhexidine Gluconate Cloth  6 each Topical Daily  ? diclofenac Sodium  4 g Topical QID  ? fluticasone furoate-vilanterol  1 puff Inhalation  Daily  ? And  ? umeclidinium bromide  1 puff Inhalation Daily  ? gabapentin  300 mg Oral TID  ? Gerhardt's butt cream   Topical BID  ? levothyroxine  75 mcg Oral Q0600  ? Oxcarbazepine  300 mg Oral BID  ? pantoprazole  40 mg Oral QHS  ? potassium chloride  40 mEq Oral BID  ? pramipexole  1 mg Oral QHS  ? sertraline  100 mg Oral Daily  ? sodium chloride flush  10-40 mL Intracatheter Q12H  ? sodium chloride flush  3 mL Intravenous Q12H  ? spironolactone  25 mg Oral Daily  ? ? ?Infusions: ? sodium chloride 20 mL/hr at 12/15/21 3536  ? ceFEPime (MAXIPIME) IV 2 g (12/14/21 2142)  ? furosemide (LASIX) 200 mg in dextrose 5% 100 mL ('2mg'$ /mL) infusion 15 mg/hr (12/15/21 1443)  ? heparin 1,050 Units/hr (12/15/21 0825)  ? magnesium sulfate bolus IVPB 2 g (12/15/21 0835)  ? milrinone 0.25 mcg/kg/min (12/15/21 1540)  ? potassium chloride    ? ? ?PRN Medications: ?acetaminophen **OR** acetaminophen, ondansetron **OR** ondansetron (ZOFRAN) IV, polyvinyl alcohol, sodium chloride flush ? ? ? ?Patient Profile  ? ?  ?[name] is a 76 y.o. 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 Lovena Le 2014), chronic diastolic HF.   ? ?Transferred from [facility] to [facility] for HF consultation.   ? ?Assessment/Plan  ?1. 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 D-shaped septum. No significant PAH with TR tracing ?- suspect primary issue here is RV dilation with severe TR either due to longstanding severe PAH in setting of OSA/OHS/COPD (but PAH mild on previous echos dating back to 2013) or leaflet tethering from pacer leads ?- Continue milrinone to augment diuresis. Renal function improving. CO-OX stable.  ?- CVP 9. Will cut back lasix 8 mg per hour. Anticipate stopping in am. Supp K.  ?- Place UNNA boots ?- will need RHC and TEE once more diuresed Consider TV clip  ?   ?2. CAD s/p CABG ?- no chest pain.  ?- off ASA with DOAC. Off statin with shock liver ?  ?3. AKI on CKD 3a ?- baseline SCr 1.3-1.5 ?- has scarred R kidney on u/s ?- SCr peaked 2.3-->today 1.1 ?- Likely due to ATN from shock ?- Continue hemodynamic support ?  ?4. 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   ?- Continue milrinone and diuresis ?  ?5. PAF with tachy-brady syndrome ?- in NSR ?- Apixaban currently on hold.  ?- ? Add heparin drip ?  ?6. Hypokalemia ?-Supp  K  and getting K runs.  ? ? 7. Fall with head trauma ?- Head CT negative for bleed ?  ?8. PVCs ?-High PVC burden. 3/20 Started on amio 200 mg twice a day to help suppress.  ?- PVC burden significantly lower today. Continue  ?- Discussed with pharmacy.    ? ? ?9. DNR/DNI ?  ?Set up TEE/RHC Wed -- orders placed  ? ?Consult PT.  ? ?Length of Stay: 4 ? ?Darrick Grinder, NP  ?12/15/2021, 8:50 AM ? ?Advanced Heart Failure Team ?Pager [phone] (M-F; 7a - 5p)  ?Please contact [facility] for night-coverage after hours (5p -7a ) and weekends on [email or web address] ? ?Patient seen with NP, agree with the above note.  ? ?Good diuresis yesterday on Lasix 15 mg/hr and milrinone 0.25 for RV support.  Co-ox 64% today, CVP 8-9.  ? ?Patient fell prior to admission, has large peri-orbital ecchymosis.  ? ?General: NAD ?Neck: JVP 9-10 cm, no thyromegaly or thyroid nodule.  ?Lungs: Clear to auscultation bilaterally with normal respiratory effort. ?CV: Nondisplaced PMI.  Heart regular S1/S2, no S3/S4, 2/6 HSM LLSB.  No edema.  No carotid bruit.  Normal pedal pulses.  ?Abdomen: Soft, nontender, no hepatosplenomegaly, no distention.  ?Skin: Intact without lesions or rashes.  ?Neurologic: Alert and oriented x 3.  ?Psych: Normal affect. ?Extremities: No clubbing or cyanosis.  ?HEENT: Peri-orbital ecchymosis on right.  ? ?Heparin gtt given PAF.  ? ?RV failure.  Volume improved with CVP 8-9.  Decrease milrinone to 0.125 today and  decrease Lasix gtt to 8 mg/hr.  Suspect we can stop tomorrow.  ? ?Will set up for [place] for tomorrow as well as TEE to assess the tricuspid valve (?candidate for percutaneous repair). Discussed risks/benefits with patie

## 2021-12-15 NOTE — Progress Notes (Signed)
?PROGRESS NOTE ? ? ? ?[name]  DDU:[id number] DOB: 03-08-1946 DOA: 12/11/2021 ?PCP: [facility]  ? ? ?Brief Narrative:  ?76 y/o female admitted to the hospital after having a fall. She reported slipping and falling when she was reaching for her walker.  She felt as though she may have been somewhat sedated from pain medication she received earlier in the day.  Patient is chronically on Eliquis for atrial fibrillation.  Upon arrival to the emergency room, CT imaging did not indicate any fractures or intracranial hemorrhage.  She did have significant ecchymosis around her right orbit, right frontal scalp hematoma as well as some right conjunctival hemorrhage.  Vision remained intact.  Her work-up showed acute kidney injury, elevated LFTs, elevated BNP and cardiomegaly.  Echocardiogram showed preserved ejection fraction with severely reduced RV function.     ? ?Transferred from [facility] to [facility] for HF consultation.  HF team managing her cardiac issues.  ? ?  ? ? ? ?Consultants:  ?Cardiology HF ? ?Procedures:  ? ?Antimicrobials:  ?  ? ? ?Subjective: ?No blurry vision, her left eye hemorrhaging is improved unable to tell me if she has shortness of breath while she is in bed.  No chest pain or dizziness ? ?Objective: ?Vitals:  ? 12/15/21 0449 12/15/21 0738 12/15/21 0828 12/15/21 1058  ?BP: (!) 123/53  (!) 136/47 124/62  ?Pulse:  63 63 66  ?Resp:  '13 18 20  '$ ?Temp: (!) 97.4 ?F (36.3 ?C)  97.6 ?F (36.4 ?C)   ?TempSrc: Oral  Oral Oral  ?SpO2: 94% 93% 95% 97%  ?Weight: 87.4 kg     ?Height:      ? ? ?Intake/Output Summary (Last 24 hours) at 12/15/2021 1502 ?Last data filed at 12/15/2021 1222 ?Gross per 24 hour  ?Intake 687.87 ml  ?Output 4250 ml  ?Net -3562.13 ml  ? ?Filed Weights  ? 12/13/21 0500 12/14/21 0221 12/15/21 0449  ?Weight: 91.3 kg 92.8 kg 87.4 kg  ? ? ?Examination: ?Calm, NAD ?Decrease bs, no wheezing  ?Reg s1/s2 no gallop ?Soft benign +bs ?+2 edema ?Aaoxox3  ?Mood and affect appropriate in current  setting  ? ? ?Data Reviewed: I have personally reviewed following labs and imaging studies ? ?CBC: ?Recent Labs  ?Lab 12/11/21 ?0653 12/12/21 ?0503 12/13/21 ?2376 12/15/21 ?1114  ?WBC 9.1 9.4 6.9 7.3  ?NEUTROABS 7.2  --   --   --   ?HGB 10.6* 10.5* 10.2* 10.5*  ?HCT 34.6* 34.7* 32.7* 34.7*  ?MCV 85.4 85.5 83.2 82.6  ?PLT 182 184 189 172  ? ?Basic Metabolic Panel: ?Recent Labs  ?Lab 12/11/21 ?0653 12/12/21 ?0503 12/13/21 ?2831 12/14/21 ?5176 12/15/21 ?1607  ?NA 134* 136 136 135 138  ?K 3.5 4.0 3.2* 3.3* 3.0*  ?CL 98 98 102 103 100  ?CO2 20* 20* '22 22 29  '$ ?GLUCOSE 133* 148* 120* 200* 142*  ?BUN 67* 70* 67* 57* 33*  ?CREATININE 2.17* 2.29* 2.11* 1.68* 1.11*  ?CALCIUM 8.2* 8.5* 8.6* 8.5* 8.0*  ?MG  --   --   --   --  1.9  ? ?GFR: ?Estimated Creatinine Clearance: 43.3 mL/min (A) (by C-G formula based on SCr of 1.11 mg/dL (H)). ?Liver Function Tests: ?Recent Labs  ?Lab 12/11/21 ?0653 12/12/21 ?0503 12/13/21 ?3710 12/15/21 ?6269  ?AST 109* 102* 66* 29  ?ALT 140* 151* 124* 73*  ?ALKPHOS 249* 265* 265* 201*  ?BILITOT 1.8* 2.3* 1.0 1.2  ?PROT 6.9 6.8 6.0* 6.0*  ?ALBUMIN 3.7 3.7 3.3* 3.2*  ? ?  No results for input(s): LIPASE, AMYLASE in the last 168 hours. ?Recent Labs  ?Lab 12/11/21 ?1243  ?AMMONIA 49*  ? ?Coagulation Profile: ?No results for input(s): INR, PROTIME in the last 168 hours. ?Cardiac Enzymes: ?No results for input(s): CKTOTAL, CKMB, CKMBINDEX, TROPONINI in the last 168 hours. ?BNP (last 3 results) ?No results for input(s): PROBNP in the last 8760 hours. ?HbA1C: ?No results for input(s): HGBA1C in the last 72 hours. ?CBG: ?Recent Labs  ?Lab 12/12/21 ?2109  ?GLUCAP 144*  ? ?Lipid Profile: ?No results for input(s): CHOL, HDL, LDLCALC, TRIG, CHOLHDL, LDLDIRECT in the last 72 hours. ?Thyroid Function Tests: ?No results for input(s): TSH, T4TOTAL, FREET4, T3FREE, THYROIDAB in the last 72 hours. ?Anemia Panel: ?No results for input(s): VITAMINB12, FOLATE, FERRITIN, TIBC, IRON, RETICCTPCT in the last 72 hours. ?Sepsis  Labs: ?Recent Labs  ?Lab 12/11/21 ?2297  ?LATICACIDVEN 1.9  ? ? ?Recent Results (from the past 240 hour(s))  ?Resp Panel by RT-PCR (Flu A&B, Covid) Nasopharyngeal Swab     Status: None  ? Collection Time: 12/11/21  8:48 AM  ? Specimen: Nasopharyngeal Swab; Nasopharyngeal(NP) swabs in vial transport medium  ?Result Value Ref Range Status  ? SARS Coronavirus 2 by RT PCR NEGATIVE NEGATIVE Final  ?  Comment: (NOTE) ?SARS-CoV-2 target nucleic acids are NOT DETECTED. ? ?The SARS-CoV-2 RNA is generally detectable in upper respiratory ?specimens during the acute phase of infection. The lowest ?concentration of SARS-CoV-2 viral copies this assay can detect is ?138 copies/mL. A negative result does not preclude SARS-Cov-2 ?infection and should not be used as the sole basis for treatment or ?other patient management decisions. A negative result may occur with  ?improper specimen collection/handling, submission of specimen other ?than nasopharyngeal swab, presence of viral mutation(s) within the ?areas targeted by this assay, and inadequate number of viral ?copies(<138 copies/mL). A negative result must be combined with ?clinical observations, patient history, and epidemiological ?information. The expected result is Negative. ? ?Fact Sheet for Patients:  ?EntrepreneurPulse.com.au ? ?Fact Sheet for Healthcare Providers:  ?IncredibleEmployment.be ? ?This test is no t yet approved or cleared by the [place] FDA and  ?has been authorized for detection and/or diagnosis of SARS-CoV-2 by ?FDA under an Emergency Use Authorization (EUA). This EUA will remain  ?in effect (meaning this test can be used) for the duration of the ?COVID-19 declaration under Section 564(b)(1) of the Act, 21 ?U.S.C.section 360bbb-3(b)(1), unless the authorization is terminated  ?or revoked sooner.  ? ? ?  ? Influenza A by PCR NEGATIVE NEGATIVE Final  ? Influenza B by PCR NEGATIVE NEGATIVE Final  ?  Comment: (NOTE) ?The  Xpert Xpress SARS-CoV-2/FLU/RSV plus assay is intended as an aid ?in the diagnosis of influenza from Nasopharyngeal swab specimens and ?should not be used as a sole basis for treatment. Nasal washings and ?aspirates are unacceptable for Xpert Xpress SARS-CoV-2/FLU/RSV ?testing. ? ?Fact Sheet for Patients: ?EntrepreneurPulse.com.au ? ?Fact Sheet for Healthcare Providers: ?IncredibleEmployment.be ? ?This test is not yet approved or cleared by the [place] FDA and ?has been authorized for detection and/or diagnosis of SARS-CoV-2 by ?FDA under an Emergency Use Authorization (EUA). This EUA will remain ?in effect (meaning this test can be used) for the duration of the ?COVID-19 declaration under Section 564(b)(1) of the Act, 21 U.S.C. ?section 360bbb-3(b)(1), unless the authorization is terminated or ?revoked. ? ?Performed at [facility], [address] [place], [place] [address] ?  ?Urine Culture     Status: Abnormal  ? Collection Time: 12/11/21  9:20  AM  ? Specimen: Urine, Clean Catch  ?Result Value Ref Range Status  ? Specimen Description   Final  ?  URINE, CLEAN CATCH ?Performed at [facility], [address] [place], [place] [address] ?  ? Special Requests   Final  ?  URINE, CLEAN CATCH ?Performed at [facility], [address] [place], [place] [address] ?  ? Culture >=100,000 COLONIES/mL KLEBSIELLA PNEUMONIAE (A)  Final  ? Report Status 12/13/2021 FINAL  Final  ? Organism ID, Bacteria KLEBSIELLA PNEUMONIAE (A)  Final  ?    Susceptibility  ? Klebsiella pneumoniae - MIC*  ?  AMPICILLIN >=32 RESISTANT Resistant   ?  CEFAZOLIN <=4 SENSITIVE Sensitive   ?  CEFEPIME <=0.12 SENSITIVE Sensitive   ?  CEFTRIAXONE <=0.25 SENSITIVE Sensitive   ?  CIPROFLOXACIN <=0.25 SENSITIVE Sensitive   ?  GENTAMICIN <=1 SENSITIVE Sensitive   ?  IMIPENEM <=0.25 SENSITIVE Sensitive   ?  NITROFURANTOIN 64 INTERMEDIATE Intermediate   ?  TRIMETH/SULFA <=20 SENSITIVE Sensitive   ?  AMPICILLIN/SULBACTAM  >=32 RESISTANT Resistant   ?  PIP/TAZO <=4 SENSITIVE Sensitive   ?  * >=100,000 COLONIES/mL KLEBSIELLA PNEUMONIAE  ?MRSA Next Gen by PCR, Nasal     Status: None  ? Collection Time: 12/12/21  8:33 PM  ? Specime

## 2021-12-15 NOTE — TOC Benefit Eligibility Note (Signed)
Patient Advocate Encounter  Insurance verification completed.    [name] currently admitted and upon discharge could be taking Entresto 24-26 mg.  The current 30 day co-pay is, $47.00.   [name] currently admitted and upon discharge could be taking Farxiga 10 mg.  The current 30 day co-pay is, $47.00.   [name] currently admitted and upon discharge could be taking Jardiance 10 mg.  The current 30 day co-pay is, $47.00.   [name] insured through [employer] Medicare Part D     Mrytle Bento, CPhT Pharmacy Patient Advocate Specialist [facility] Patient Advocate Team Direct Number: [phone]  Fax: [phone]        

## 2021-12-16 ENCOUNTER — Inpatient Hospital Stay (HOSPITAL_COMMUNITY): Payer: Medicare Other | Admitting: Anesthesiology

## 2021-12-16 ENCOUNTER — Encounter (HOSPITAL_COMMUNITY)
Admission: EM | Disposition: A | Discharge status: Hospice | Payer: Self-pay | Source: Home / Self Care | Attending: Internal Medicine

## 2021-12-16 ENCOUNTER — Encounter (HOSPITAL_COMMUNITY): Payer: Self-pay | Admitting: Internal Medicine

## 2021-12-16 ENCOUNTER — Inpatient Hospital Stay (HOSPITAL_COMMUNITY): Payer: Medicare Other

## 2021-12-16 DIAGNOSIS — I071 Rheumatic tricuspid insufficiency: Secondary | ICD-10-CM

## 2021-12-16 DIAGNOSIS — I50810 Right heart failure, unspecified: Secondary | ICD-10-CM | POA: Diagnosis not present

## 2021-12-16 DIAGNOSIS — I340 Nonrheumatic mitral (valve) insufficiency: Secondary | ICD-10-CM

## 2021-12-16 DIAGNOSIS — I2510 Atherosclerotic heart disease of native coronary artery without angina pectoris: Secondary | ICD-10-CM

## 2021-12-16 DIAGNOSIS — I130 Hypertensive heart and chronic kidney disease with heart failure and stage 1 through stage 4 chronic kidney disease, or unspecified chronic kidney disease: Secondary | ICD-10-CM

## 2021-12-16 DIAGNOSIS — I361 Nonrheumatic tricuspid (valve) insufficiency: Secondary | ICD-10-CM

## 2021-12-16 DIAGNOSIS — N1832 Chronic kidney disease, stage 3b: Secondary | ICD-10-CM

## 2021-12-16 DIAGNOSIS — I482 Chronic atrial fibrillation, unspecified: Secondary | ICD-10-CM | POA: Diagnosis not present

## 2021-12-16 DIAGNOSIS — N1831 Chronic kidney disease, stage 3a: Secondary | ICD-10-CM

## 2021-12-16 DIAGNOSIS — I081 Rheumatic disorders of both mitral and tricuspid valves: Secondary | ICD-10-CM

## 2021-12-16 DIAGNOSIS — I5032 Chronic diastolic (congestive) heart failure: Secondary | ICD-10-CM

## 2021-12-16 HISTORY — PX: RIGHT HEART CATH: CATH118263

## 2021-12-16 HISTORY — PX: TEE WITHOUT CARDIOVERSION: SHX5443

## 2021-12-16 LAB — POCT I-STAT EG7
Acid-Base Excess: 8 mmol/L — ABNORMAL HIGH (ref 0.0–2.0)
Acid-Base Excess: 8 mmol/L — ABNORMAL HIGH (ref 0.0–2.0)
Bicarbonate: 32.9 mmol/L — ABNORMAL HIGH (ref 20.0–28.0)
Bicarbonate: 32.9 mmol/L — ABNORMAL HIGH (ref 20.0–28.0)
Calcium, Ion: 1.09 mmol/L — ABNORMAL LOW (ref 1.15–1.40)
Calcium, Ion: 1.13 mmol/L — ABNORMAL LOW (ref 1.15–1.40)
HCT: 37 % (ref 36.0–46.0)
HCT: 37 % (ref 36.0–46.0)
Hemoglobin: 12.6 g/dL (ref 12.0–15.0)
Hemoglobin: 12.6 g/dL (ref 12.0–15.0)
O2 Saturation: 69 %
O2 Saturation: 70 %
Potassium: 4.7 mmol/L (ref 3.5–5.1)
Potassium: 4.8 mmol/L (ref 3.5–5.1)
Sodium: 140 mmol/L (ref 135–145)
Sodium: 140 mmol/L (ref 135–145)
TCO2: 34 mmol/L — ABNORMAL HIGH (ref 22–32)
TCO2: 34 mmol/L — ABNORMAL HIGH (ref 22–32)
pCO2, Ven: 45.4 mmHg (ref 44–60)
pCO2, Ven: 45.5 mmHg (ref 44–60)
pH, Ven: 7.467 — ABNORMAL HIGH (ref 7.25–7.43)
pH, Ven: 7.468 — ABNORMAL HIGH (ref 7.25–7.43)
pO2, Ven: 34 mmHg (ref 32–45)
pO2, Ven: 35 mmHg (ref 32–45)

## 2021-12-16 LAB — CBC
HCT: 34.2 % — ABNORMAL LOW (ref 36.0–46.0)
Hemoglobin: 10.5 g/dL — ABNORMAL LOW (ref 12.0–15.0)
MCH: 25.2 pg — ABNORMAL LOW (ref 26.0–34.0)
MCHC: 30.7 g/dL (ref 30.0–36.0)
MCV: 82 fL (ref 80.0–100.0)
Platelets: 168 10*3/uL (ref 150–400)
RBC: 4.17 MIL/uL (ref 3.87–5.11)
RDW: 20 % — ABNORMAL HIGH (ref 11.5–15.5)
WBC: 7.1 10*3/uL (ref 4.0–10.5)
nRBC: 0 % (ref 0.0–0.2)

## 2021-12-16 LAB — BASIC METABOLIC PANEL
Anion gap: 7 (ref 5–15)
BUN: 26 mg/dL — ABNORMAL HIGH (ref 8–23)
CO2: 33 mmol/L — ABNORMAL HIGH (ref 22–32)
Calcium: 8.6 mg/dL — ABNORMAL LOW (ref 8.9–10.3)
Chloride: 98 mmol/L (ref 98–111)
Creatinine, Ser: 1.11 mg/dL — ABNORMAL HIGH (ref 0.44–1.00)
GFR, Estimated: 52 mL/min — ABNORMAL LOW (ref >60)
Glucose, Bld: 123 mg/dL — ABNORMAL HIGH (ref 70–99)
Potassium: 4.9 mmol/L (ref 3.5–5.1)
Sodium: 138 mmol/L (ref 135–145)

## 2021-12-16 LAB — COOXEMETRY PANEL
Carboxyhemoglobin: 2 % — ABNORMAL HIGH (ref 0.5–1.5)
Methemoglobin: <0.7 % (ref 0.0–1.5)
O2 Saturation: 64 %
Total hemoglobin: 11 g/dL — ABNORMAL LOW (ref 12.0–16.0)

## 2021-12-16 LAB — TSH: TSH: 7.399 u[IU]/mL — ABNORMAL HIGH (ref 0.350–4.500)

## 2021-12-16 LAB — HEPARIN LEVEL (UNFRACTIONATED): Heparin Unfractionated: 0.55 IU/mL (ref 0.30–0.70)

## 2021-12-16 LAB — T4, FREE: Free T4: 1.05 ng/dL (ref 0.61–1.12)

## 2021-12-16 LAB — HEMOGLOBIN A1C
Hgb A1c MFr Bld: 7 % — ABNORMAL HIGH (ref 4.8–5.6)
Mean Plasma Glucose: 154.2 mg/dL

## 2021-12-16 SURGERY — RIGHT HEART CATH
Anesthesia: LOCAL

## 2021-12-16 SURGERY — ECHOCARDIOGRAM, TRANSESOPHAGEAL
Anesthesia: Monitor Anesthesia Care

## 2021-12-16 MED ORDER — LIDOCAINE HCL (PF) 1 % IJ SOLN
INTRAMUSCULAR | Status: AC
  Filled 2021-12-16: qty 30

## 2021-12-16 MED ORDER — APIXABAN 5 MG PO TABS
5.0000 mg | ORAL_TABLET | Freq: Two times a day (BID) | ORAL | Status: DC
  Administered 2021-12-16 – 2021-12-18 (×4): 5 mg via ORAL
  Filled 2021-12-16 (×4): qty 1

## 2021-12-16 MED ORDER — LIDOCAINE HCL (PF) 1 % IJ SOLN
INTRAMUSCULAR | Status: DC | PRN
Start: 2021-12-16 — End: 2021-12-16
  Administered 2021-12-16: 5 mL

## 2021-12-16 MED ORDER — LABETALOL HCL 5 MG/ML IV SOLN: 10.0000 mg | INTRAVENOUS | Status: AC | PRN

## 2021-12-16 MED ORDER — SODIUM CHLORIDE 0.9% FLUSH: 3.0000 mL | INTRAVENOUS | Status: DC | PRN

## 2021-12-16 MED ORDER — HEPARIN (PORCINE) IN NACL 1000-0.9 UT/500ML-% IV SOLN
INTRAVENOUS | Status: DC | PRN
  Administered 2021-12-16: 500 mL

## 2021-12-16 MED ORDER — HEPARIN (PORCINE) IN NACL 1000-0.9 UT/500ML-% IV SOLN
INTRAVENOUS | Status: AC
  Filled 2021-12-16: qty 500

## 2021-12-16 MED ORDER — ONDANSETRON HCL 4 MG/2ML IJ SOLN: 4.0000 mg | Freq: Four times a day (QID) | INTRAMUSCULAR | Status: DC | PRN

## 2021-12-16 MED ORDER — SODIUM CHLORIDE 0.9% FLUSH: 3.0000 mL | Freq: Two times a day (BID) | INTRAVENOUS | Status: DC

## 2021-12-16 MED ORDER — FUROSEMIDE 10 MG/ML IJ SOLN
80.0000 mg | Freq: Two times a day (BID) | INTRAMUSCULAR | Status: DC
  Administered 2021-12-16 – 2021-12-17 (×3): 80 mg via INTRAVENOUS
  Filled 2021-12-16 (×3): qty 8

## 2021-12-16 MED ORDER — PROPOFOL 10 MG/ML IV BOLUS
INTRAVENOUS | Status: DC | PRN
  Administered 2021-12-16 (×3): 30 mg via INTRAVENOUS
  Administered 2021-12-16: 40 mg via INTRAVENOUS

## 2021-12-16 MED ORDER — PHENYLEPHRINE 40 MCG/ML (10ML) SYRINGE FOR IV PUSH (FOR BLOOD PRESSURE SUPPORT)
PREFILLED_SYRINGE | INTRAVENOUS | Status: DC | PRN
  Administered 2021-12-16: 120 ug via INTRAVENOUS
  Administered 2021-12-16: 80 ug via INTRAVENOUS
  Administered 2021-12-16: 120 ug via INTRAVENOUS
  Administered 2021-12-16: 80 ug via INTRAVENOUS

## 2021-12-16 MED ORDER — HYDRALAZINE HCL 20 MG/ML IJ SOLN: 10.0000 mg | INTRAMUSCULAR | Status: AC | PRN

## 2021-12-16 MED ORDER — EPHEDRINE SULFATE-NACL 50-0.9 MG/10ML-% IV SOSY
PREFILLED_SYRINGE | INTRAVENOUS | Status: DC | PRN
  Administered 2021-12-16 (×2): 5 mg via INTRAVENOUS
  Administered 2021-12-16: 10 mg via INTRAVENOUS

## 2021-12-16 MED ORDER — SODIUM CHLORIDE 0.9 % IV SOLN: 250.0000 mL | INTRAVENOUS | Status: DC | PRN

## 2021-12-16 MED ORDER — ACETAMINOPHEN 325 MG PO TABS: 650.0000 mg | ORAL_TABLET | ORAL | Status: DC | PRN

## 2021-12-16 MED ORDER — PROPOFOL 500 MG/50ML IV EMUL
INTRAVENOUS | Status: DC | PRN
Start: 2021-12-16 — End: 2021-12-16
  Administered 2021-12-16: 75 ug/kg/min via INTRAVENOUS

## 2021-12-16 SURGICAL SUPPLY — 12 items
CATH BALLN WEDGE 5F 110CM (CATHETERS) ×1 IMPLANT
CATH SWAN GANZ 7F STRAIGHT (CATHETERS) ×1 IMPLANT
GUIDEWIRE .025 260CM (WIRE) ×1 IMPLANT
KIT MICROPUNCTURE NIT STIFF (SHEATH) ×1 IMPLANT
MAT PREVALON FULL STRYKER (MISCELLANEOUS) ×1 IMPLANT
PACK CARDIAC CATHETERIZATION (CUSTOM PROCEDURE TRAY) ×3 IMPLANT
PROTECTION STATION PRESSURIZED (MISCELLANEOUS) ×2
SHEATH GLIDE SLENDER 4/5FR (SHEATH) ×1 IMPLANT
SHEATH PINNACLE 7F 10CM (SHEATH) ×1 IMPLANT
SHEATH PROBE COVER 6X72 (BAG) ×2 IMPLANT
STATION PROTECTION PRESSURIZED (MISCELLANEOUS) IMPLANT
TRANSDUCER W/STOPCOCK (MISCELLANEOUS) ×3 IMPLANT

## 2021-12-16 NOTE — Transfer of Care (Signed)
Immediate Anesthesia Transfer of Care Note ? ?Patient: [name] ? ?Procedure(s) Performed: TRANSESOPHAGEAL ECHOCARDIOGRAM (TEE) ? ?Patient Location: PACU and Cath Lab ? ?Anesthesia Type:MAC ? ?Level of Consciousness: drowsy ? ?Airway & Oxygen Therapy: Patient Spontanous Breathing ? ?Post-op Assessment: Report given to RN and Post -op Vital signs reviewed and stable ? ?Post vital signs: Reviewed and stable ? ?Last Vitals:  ?Vitals Value Taken Time  ?BP    ?Temp    ?Pulse    ?Resp    ?SpO2    ? ? ?Last Pain:  ?Vitals:  ? 12/16/21 0831  ?TempSrc: Temporal  ?PainSc: 0-No pain  ?   ? ?Patients Stated Pain Goal: 2 (12/13/21 0244) ? ?Complications: No notable events documented. ?

## 2021-12-16 NOTE — Progress Notes (Signed)
Pt scheduled for RHC 3/22. Confirmed with cath lab RN Rennis Harding that pt will not need a 20 G PIV. ?

## 2021-12-16 NOTE — Anesthesia Preprocedure Evaluation (Signed)
Anesthesia Evaluation  ?Patient identified by MRN, date of birth, ID band ?Patient awake ? ? ? ?Reviewed: ?Allergy & Precautions, NPO status , Patient's Chart, lab work & pertinent test results ? ?Airway ?Mallampati: II ? ?TM Distance: >3 FB ?Neck ROM: Full ? ? ? Dental ?no notable dental hx. ? ?  ?Pulmonary ?COPD, former smoker,  ?  ?Pulmonary exam normal ?breath sounds clear to auscultation ? ? ? ? ? ? Cardiovascular ?hypertension, + CAD, + Past MI, + Cardiac Stents, + CABG and +CHF  ?+ pacemaker + Valvular Problems/Murmurs MR  ?Rhythm:Regular Rate:Normal ?+ Systolic murmurs ?The  ?left ventricle has normal function. The left ventricle has no regional  ?wall motion abnormalities. Left ventricular diastolic parameters are  ?indeterminate.  ??2. Ventricular septum is flattened in systole and diastole consistent  ?with RV pressure and volume overload. . Right ventricular systolic  ?function is severely reduced. The right ventricular size is severely  ?enlarged. There is normal pulmonary artery  ?systolic pressure.  ??3. Right atrial size was severely dilated.  ??4. The mitral valve is abnormal. Mild mitral valve regurgitation. No  ?evidence of mitral stenosis.  ??5. The tricuspid valve is abnormal. Tricuspid valve regurgitation is  ?severe.  ??6. The aortic valve is tricuspid. Aortic valve regurgitation is not  ?visualized. No aortic stenosis is present.  ??7. The inferior vena cava is dilated in size with <50% respiratory  ?variability, suggesting right atrial pressure of 15 mmHg.  ?  ?Neuro/Psych ?negative neurological ROS ? negative psych ROS  ? GI/Hepatic ?Neg liver ROS, GERD  ,  ?Endo/Other  ?Hypothyroidism  ? Renal/GU ?negative Renal ROS  ?negative genitourinary ?  ?Musculoskeletal ?negative musculoskeletal ROS ?(+)  ? Abdominal ?  ?Peds ?negative pediatric ROS ?(+)  Hematology ? ?(+) Blood dyscrasia, anemia ,   ?Anesthesia Other Findings ? ? Reproductive/Obstetrics ?negative  OB ROS ? ?  ? ? ? ? ? ? ? ? ? ? ? ? ? ?  ?  ? ? ? ? ? ? ? ? ?Anesthesia Physical ?Anesthesia Plan ? ?ASA: 4 ? ?Anesthesia Plan: MAC  ? ?Post-op Pain Management:   ? ?Induction: Intravenous ? ?PONV Risk Score and Plan: 2 and Propofol infusion and Treatment may vary due to age or medical condition ? ?Airway Management Planned: Simple Face Mask ? ?Additional Equipment:  ? ?Intra-op Plan:  ? ?Post-operative Plan:  ? ?Informed Consent: I have reviewed the patients History and Physical, chart, labs and discussed the procedure including the risks, benefits and alternatives for the proposed anesthesia with the patient or authorized representative who has indicated his/her understanding and acceptance.  ? ? ? ?Dental advisory given ? ?Plan Discussed with: CRNA and Surgeon ? ?Anesthesia Plan Comments:   ? ? ? ? ? ? ?Anesthesia Quick Evaluation ? ?

## 2021-12-16 NOTE — Progress Notes (Signed)
?  Echocardiogram ?Echocardiogram Transesophageal has been performed. ? ?[name] ?12/16/2021, 12:01 PM ?

## 2021-12-16 NOTE — H&P (View-Only) (Signed)
? ? Advanced Heart Failure Rounding Note ? ?PCP-Cardiologist: None  ? ?Subjective:   ?3/19 Continued on milrinone and started on lasix drip.  ?3/20 started on po amio to suppress PVCs. PICC placed. Diuresed with lasix drip.  ? ?Remains on milrinone 0.25 mcg. CO-OX  64% ? ?Continues to diurese well on lasix gtt weight down another 5 pounds. (16 pounds total).  ? ?SCr has normalized  ? ?Feels weak. Denies CP, orthopnea or PND  ? ?Objective:   ?Weight Range: ?85.3 kg ?Body mass index is 35.53 kg/m?.  ? ?Vital Signs:   ?Temp:  [97.2 ?F (36.2 ?C)-97.9 ?F (36.6 ?C)] 97.2 ?F (36.2 ?C) (03/22 0450) ?Pulse Rate:  [66-84] 84 (03/22 0450) ?Resp:  [15-28] 25 (03/22 0453) ?BP: (108-129)/(42-62) 108/60 (03/22 0450) ?SpO2:  [92 %-98 %] 92 % (03/22 0755) ?Weight:  [85.3 kg] 85.3 kg (03/22 0450) ?Last BM Date : 12/14/21 ? ?Weight change: ?Filed Weights  ? 12/14/21 0221 12/15/21 0449 12/16/21 0450  ?Weight: 92.8 kg 87.4 kg 85.3 kg  ? ? ?Intake/Output:  ? ?Intake/Output Summary (Last 24 hours) at 12/16/2021 0831 ?Last data filed at 12/16/2021 5361 ?Gross per 24 hour  ?Intake 417 ml  ?Output 3750 ml  ?Net -3333 ml  ? ?  ? ? ?Physical Exam  ? ?General:  Weak appearing.  No resp difficulty ?HEENT: forehead/R periorbital ecchymotic ?General:  Well appearing. No resp difficulty ?Cor: PMI nondisplaced. Regular rate & rhythm. 3/6 TR ?Lungs: clear ?Abdomen: obese soft, nontender, nondistended. No hepatosplenomegaly. No bruits or masses. Good bowel sounds. ?Extremities: no cyanosis, clubbing, rash, tredema ?Neuro: alert & orientedx3, cranial nerves grossly intact. moves all 4 extremities w/o difficulty. Affect pleasant ? ? ?Telemetry  ? ?A paced 50-60s rare PVC. Personally reviewed ? ? ?Labs  ?  ?CBC ?Recent Labs  ?  12/15/21 ?1114 12/16/21 ?4431  ?WBC 7.3 7.1  ?HGB 10.5* 10.5*  ?HCT 34.7* 34.2*  ?MCV 82.6 82.0  ?PLT 172 168  ? ? ?Basic Metabolic Panel ?Recent Labs  ?  12/15/21 ?5400 12/16/21 ?0455  ?NA 138 138  ?K 3.0* 4.9  ?CL 100 98  ?CO2  29 33*  ?GLUCOSE 142* 123*  ?BUN 33* 26*  ?CREATININE 1.11* 1.11*  ?CALCIUM 8.0* 8.6*  ?MG 1.9  --   ? ? ?Liver Function Tests ?Recent Labs  ?  12/15/21 ?8676  ?AST 29  ?ALT 73*  ?ALKPHOS 201*  ?BILITOT 1.2  ?PROT 6.0*  ?ALBUMIN 3.2*  ? ? ?No results for input(s): LIPASE, AMYLASE in the last 72 hours. ?Cardiac Enzymes ?No results for input(s): CKTOTAL, CKMB, CKMBINDEX, TROPONINI in the last 72 hours. ? ?BNP: ?BNP (last 3 results) ?Recent Labs  ?  12/11/21 ?0653  ?BNP 1,055.0*  ? ? ? ?ProBNP (last 3 results) ?No results for input(s): PROBNP in the last 8760 hours. ? ? ?D-Dimer ?No results for input(s): DDIMER in the last 72 hours. ?Hemoglobin A1C ?Recent Labs  ?  12/16/21 ?0456  ?HGBA1C 7.0*  ? ?Fasting Lipid Panel ?No results for input(s): CHOL, HDL, LDLCALC, TRIG, CHOLHDL, LDLDIRECT in the last 72 hours. ?Thyroid Function Tests ?Recent Labs  ?  12/16/21 ?0456  ?TSH 7.399*  ? ? ?Other results: ? ? ?Imaging  ? ? ?No results found. ? ? ?Medications:   ? ? ?Scheduled Medications: ? [MAR Hold] acidophilus  1 capsule Oral Daily  ? [MAR Hold] amiodarone  200 mg Oral BID  ? [MAR Hold] Chlorhexidine Gluconate Cloth  6 each Topical Daily  ? [facility] Hold]  diclofenac Sodium  4 g Topical QID  ? [MAR Hold] fluticasone furoate-vilanterol  1 puff Inhalation Daily  ? And  ? [MAR Hold] umeclidinium bromide  1 puff Inhalation Daily  ? [MAR Hold] gabapentin  300 mg Oral TID  ? [MAR Hold] Gerhardt's butt cream   Topical BID  ? [MAR Hold] levothyroxine  75 mcg Oral Q0600  ? [MAR Hold] Oxcarbazepine  300 mg Oral BID  ? [MAR Hold] pantoprazole  40 mg Oral QHS  ? [MAR Hold] potassium chloride  40 mEq Oral BID  ? [MAR Hold] pramipexole  1 mg Oral QHS  ? [MAR Hold] sertraline  100 mg Oral Daily  ? [MAR Hold] sodium chloride flush  10-40 mL Intracatheter Q12H  ? [MAR Hold] sodium chloride flush  3 mL Intravenous Q12H  ? [MAR Hold] spironolactone  25 mg Oral Daily  ? ? ?Infusions: ? sodium chloride 20 mL/hr at 12/16/21 0437  ? sodium chloride     ? sodium chloride 10 mL/hr at 12/16/21 0141  ? [MAR Hold] cefTRIAXone (ROCEPHIN)  IV 1 g (12/15/21 2059)  ? furosemide (LASIX) 200 mg in dextrose 5% 100 mL ('2mg'$ /mL) infusion 8 mg/hr (12/15/21 0948)  ? heparin 1,050 Units/hr (12/16/21 0829)  ? milrinone 0.125 mcg/kg/min (12/15/21 0949)  ? ? ?PRN Medications: ?sodium chloride, [MAR Hold] acetaminophen **OR** [MAR Hold] acetaminophen, [MAR Hold] ondansetron **OR** [MAR Hold] ondansetron (ZOFRAN) IV, [MAR Hold] polyvinyl alcohol, [MAR Hold] sodium chloride flush, sodium chloride flush ? ? ? ?Patient Profile  ? ?  ?[name] is a 76 y.o. 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 Lovena Le 2014), chronic diastolic HF.   ? ?Transferred from [facility] to [facility] for HF consultation.   ? ?Assessment/Plan  ?1. 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 D-shaped septum. No significant PAH with TR tracing ?- suspect primary issue here is RV dilation with severe TR either due to longstanding severe PAH in setting of OSA/OHS/COPD (but PAH mild on previous echos dating back to 2013) or leaflet tethering from pacer leads ?- Appears nearly euvolemic. Stop lasix. Will drop milrinone to 0.125 ?- For RHC and TEE today to evaluate for possibleTV clip  ?  ?2. CAD s/p CABG ?- no s/s angina ?- off ASA with DOAC. Off statin with shock liver ?  ?3. AKI on CKD 3a ?- baseline SCr 1.3-1.5 ?- has scarred R kidney on u/s ?- SCr peaked 2.3-->today 1.1 ?- Likely due to ATN from shock ?- Continue hemodynamic support ?  ?4. 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  ?5. PAF with tachy-brady syndrome ?- in NSR ?- Apixaban currently on hold. On heparin ?- Resume apixaban when procedures complete ?  ?6. Hypokalemia ?- supp ? ? 7. Fall with head  trauma ?- Head CT negative for bleed ?  ?8. PVCs ?-High PVC burden. 3/20 Started on amio 200 mg twice a day to help suppress.  ?- PVC burden significantly lower today. Continue ? ? ?9. DNR/DNI ?  ? ?Length of Stay: 5 ? ?Glori Bickers, MD  ?12/16/2021, 8:31 AM ? ?Advanced Heart Failure Team ?Pager [phone] (M-F; 7a - 5p)  ?Please contact [facility] for night-coverage after hours (5p -7a ) and weekends on [email or web address] ? ? ? ?

## 2021-12-16 NOTE — CV Procedure (Signed)
? ? ?  TRANSESOPHAGEAL ECHOCARDIOGRAM  ? ?NAME:  [name]   MRN: [id number] ?DOB:  21-Sep-1946   ADMIT DATE: 12/11/2021 ? ?INDICATIONS: ? ? ?PROCEDURE:  ? ?Informed consent was obtained prior to the procedure. The risks, benefits and alternatives for the procedure were discussed and the patient comprehended these risks.  Risks include, but are not limited to, cough, sore throat, vomiting, nausea, somnolence, esophageal and stomach trauma or perforation, bleeding, low blood pressure, aspiration, pneumonia, infection, trauma to the teeth and death.   ? ?After a procedural time-out, the patient was sedated by the anesthesia service.  The transesophageal probe was inserted in the esophagus and stomach without difficulty and multiple views were obtained.  ? ? ?COMPLICATIONS:   ? ?There were no immediate complications. ? ?FINDINGS: ? ?LEFT VENTRICLE: EF = 60%. No regional wall motion abnormalities. ? ?RIGHT VENTRICLE: Mildly dilated. Mildly HK (on milrinone 0.125) ? ?LEFT ATRIUM: Mildly dilated ? ?LEFT ATRIAL APPENDAGE: No thrombus.  ? ?RIGHT ATRIUM: Severely dilated ? ?AORTIC VALVE:  Trileaflet. Trivial AI ? ?MITRAL VALVE:    Normal. Mild to moderate MR ? ?TRICUSPID VALVE: Poor coaptation. Suspect component of pacer wire restriction. Wide open TR willing entire RA ? ?PULMONIC VALVE: Moderate PR ? ?INTERATRIAL SEPTUM: No PFO or ASD. Septum bulges R to left ? ?PERICARDIUM: No effusion ? ?DESCENDING AORTA: Severe plaque ? ? ? ?Dmitriy Gair,MD ?10:11 AM ?  ?

## 2021-12-16 NOTE — Progress Notes (Signed)
? ? Advanced Heart Failure Rounding Note ? ?PCP-Cardiologist: None  ? ?Subjective:   ?3/19 Continued on milrinone and started on lasix drip.  ?3/20 started on po amio to suppress PVCs. PICC placed. Diuresed with lasix drip.  ? ?Remains on milrinone 0.25 mcg. CO-OX  64% ? ?Continues to diurese well on lasix gtt weight down another 5 pounds. (16 pounds total).  ? ?SCr has normalized  ? ?Feels weak. Denies CP, orthopnea or PND  ? ?Objective:   ?Weight Range: ?85.3 kg ?Body mass index is 35.53 kg/m?.  ? ?Vital Signs:   ?Temp:  [97.2 ?F (36.2 ?C)-97.9 ?F (36.6 ?C)] 97.2 ?F (36.2 ?C) (03/22 0450) ?Pulse Rate:  [66-84] 84 (03/22 0450) ?Resp:  [15-28] 25 (03/22 0453) ?BP: (108-129)/(42-62) 108/60 (03/22 0450) ?SpO2:  [92 %-98 %] 92 % (03/22 0755) ?Weight:  [85.3 kg] 85.3 kg (03/22 0450) ?Last BM Date : 12/14/21 ? ?Weight change: ?Filed Weights  ? 12/14/21 0221 12/15/21 0449 12/16/21 0450  ?Weight: 92.8 kg 87.4 kg 85.3 kg  ? ? ?Intake/Output:  ? ?Intake/Output Summary (Last 24 hours) at 12/16/2021 0831 ?Last data filed at 12/16/2021 6269 ?Gross per 24 hour  ?Intake 417 ml  ?Output 3750 ml  ?Net -3333 ml  ? ?  ? ? ?Physical Exam  ? ?General:  Weak appearing.  No resp difficulty ?HEENT: forehead/R periorbital ecchymotic ?General:  Well appearing. No resp difficulty ?Cor: PMI nondisplaced. Regular rate & rhythm. 3/6 TR ?Lungs: clear ?Abdomen: obese soft, nontender, nondistended. No hepatosplenomegaly. No bruits or masses. Good bowel sounds. ?Extremities: no cyanosis, clubbing, rash, tredema ?Neuro: alert & orientedx3, cranial nerves grossly intact. moves all 4 extremities w/o difficulty. Affect pleasant ? ? ?Telemetry  ? ?A paced 50-60s rare PVC. Personally reviewed ? ? ?Labs  ?  ?CBC ?Recent Labs  ?  12/15/21 ?1114 12/16/21 ?4854  ?WBC 7.3 7.1  ?HGB 10.5* 10.5*  ?HCT 34.7* 34.2*  ?MCV 82.6 82.0  ?PLT 172 168  ? ? ?Basic Metabolic Panel ?Recent Labs  ?  12/15/21 ?6270 12/16/21 ?0455  ?NA 138 138  ?K 3.0* 4.9  ?CL 100 98  ?CO2  29 33*  ?GLUCOSE 142* 123*  ?BUN 33* 26*  ?CREATININE 1.11* 1.11*  ?CALCIUM 8.0* 8.6*  ?MG 1.9  --   ? ? ?Liver Function Tests ?Recent Labs  ?  12/15/21 ?3500  ?AST 29  ?ALT 73*  ?ALKPHOS 201*  ?BILITOT 1.2  ?PROT 6.0*  ?ALBUMIN 3.2*  ? ? ?No results for input(s): LIPASE, AMYLASE in the last 72 hours. ?Cardiac Enzymes ?No results for input(s): CKTOTAL, CKMB, CKMBINDEX, TROPONINI in the last 72 hours. ? ?BNP: ?BNP (last 3 results) ?Recent Labs  ?  12/11/21 ?0653  ?BNP 1,055.0*  ? ? ? ?ProBNP (last 3 results) ?No results for input(s): PROBNP in the last 8760 hours. ? ? ?D-Dimer ?No results for input(s): DDIMER in the last 72 hours. ?Hemoglobin A1C ?Recent Labs  ?  12/16/21 ?0456  ?HGBA1C 7.0*  ? ?Fasting Lipid Panel ?No results for input(s): CHOL, HDL, LDLCALC, TRIG, CHOLHDL, LDLDIRECT in the last 72 hours. ?Thyroid Function Tests ?Recent Labs  ?  12/16/21 ?0456  ?TSH 7.399*  ? ? ?Other results: ? ? ?Imaging  ? ? ?No results found. ? ? ?Medications:   ? ? ?Scheduled Medications: ? [MAR Hold] acidophilus  1 capsule Oral Daily  ? [MAR Hold] amiodarone  200 mg Oral BID  ? [MAR Hold] Chlorhexidine Gluconate Cloth  6 each Topical Daily  ? [facility] Hold]  diclofenac Sodium  4 g Topical QID  ? [MAR Hold] fluticasone furoate-vilanterol  1 puff Inhalation Daily  ? And  ? [MAR Hold] umeclidinium bromide  1 puff Inhalation Daily  ? [MAR Hold] gabapentin  300 mg Oral TID  ? [MAR Hold] Gerhardt's butt cream   Topical BID  ? [MAR Hold] levothyroxine  75 mcg Oral Q0600  ? [MAR Hold] Oxcarbazepine  300 mg Oral BID  ? [MAR Hold] pantoprazole  40 mg Oral QHS  ? [MAR Hold] potassium chloride  40 mEq Oral BID  ? [MAR Hold] pramipexole  1 mg Oral QHS  ? [MAR Hold] sertraline  100 mg Oral Daily  ? [MAR Hold] sodium chloride flush  10-40 mL Intracatheter Q12H  ? [MAR Hold] sodium chloride flush  3 mL Intravenous Q12H  ? [MAR Hold] spironolactone  25 mg Oral Daily  ? ? ?Infusions: ? sodium chloride 20 mL/hr at 12/16/21 0437  ? sodium chloride     ? sodium chloride 10 mL/hr at 12/16/21 0141  ? [MAR Hold] cefTRIAXone (ROCEPHIN)  IV 1 g (12/15/21 2059)  ? furosemide (LASIX) 200 mg in dextrose 5% 100 mL ('2mg'$ /mL) infusion 8 mg/hr (12/15/21 0948)  ? heparin 1,050 Units/hr (12/16/21 0829)  ? milrinone 0.125 mcg/kg/min (12/15/21 0949)  ? ? ?PRN Medications: ?sodium chloride, [MAR Hold] acetaminophen **OR** [MAR Hold] acetaminophen, [MAR Hold] ondansetron **OR** [MAR Hold] ondansetron (ZOFRAN) IV, [MAR Hold] polyvinyl alcohol, [MAR Hold] sodium chloride flush, sodium chloride flush ? ? ? ?Patient Profile  ? ?  ?[name] is a 76 y.o. 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 Lovena Le 2014), chronic diastolic HF.   ? ?Transferred from [facility] to [facility] for HF consultation.   ? ?Assessment/Plan  ?1. 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 D-shaped septum. No significant PAH with TR tracing ?- suspect primary issue here is RV dilation with severe TR either due to longstanding severe PAH in setting of OSA/OHS/COPD (but PAH mild on previous echos dating back to 2013) or leaflet tethering from pacer leads ?- Appears nearly euvolemic. Stop lasix. Will drop milrinone to 0.125 ?- For RHC and TEE today to evaluate for possibleTV clip  ?  ?2. CAD s/p CABG ?- no s/s angina ?- off ASA with DOAC. Off statin with shock liver ?  ?3. AKI on CKD 3a ?- baseline SCr 1.3-1.5 ?- has scarred R kidney on u/s ?- SCr peaked 2.3-->today 1.1 ?- Likely due to ATN from shock ?- Continue hemodynamic support ?  ?4. 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  ?5. PAF with tachy-brady syndrome ?- in NSR ?- Apixaban currently on hold. On heparin ?- Resume apixaban when procedures complete ?  ?6. Hypokalemia ?- supp ? ? 7. Fall with head  trauma ?- Head CT negative for bleed ?  ?8. PVCs ?-High PVC burden. 3/20 Started on amio 200 mg twice a day to help suppress.  ?- PVC burden significantly lower today. Continue ? ? ?9. DNR/DNI ?  ? ?Length of Stay: 5 ? ?Glori Bickers, MD  ?12/16/2021, 8:31 AM ? ?Advanced Heart Failure Team ?Pager [phone] (M-F; 7a - 5p)  ?Please contact [facility] for night-coverage after hours (5p -7a ) and weekends on [email or web address] ? ? ? ?

## 2021-12-16 NOTE — Interval H&P Note (Signed)
History and Physical Interval Note: ? ?12/16/2021 ?10:16 AM ? ?[name]  has presented today for surgery, with the diagnosis of heart failure.  The various methods of treatment have been discussed with the patient and family. After consideration of risks, benefits and other options for treatment, the patient has consented to  Procedure(s): ?RIGHT HEART CATH (N/A) as a surgical intervention.  The patient's history has been reviewed, patient examined, no change in status, stable for surgery.  I have reviewed the patient's chart and labs.  Questions were answered to the patient's satisfaction.   ? ? ?[name] ? ? ?

## 2021-12-16 NOTE — Interval H&P Note (Signed)
History and Physical Interval Note: ? ?12/16/2021 ?8:30 AM ? ?[name]  has presented today for surgery, with the diagnosis of RV failure/severe TR.  The various methods of treatment have been discussed with the patient and family. After consideration of risks, benefits and other options for treatment, the patient has consented to  Procedure(s): ?TRANSESOPHAGEAL ECHOCARDIOGRAM (TEE) (N/A) as a surgical intervention.  The patient's history has been reviewed, patient examined, no change in status, stable for surgery.  I have reviewed the patient's chart and labs.  Questions were answered to the patient's satisfaction.   ? ? ?[name] ? ? ?

## 2021-12-16 NOTE — Progress Notes (Signed)
ANTICOAGULATION CONSULT NOTE ? ?Pharmacy Consult for Heparin  ?Indication: atrial fibrillation ? ?Allergies  ?Allergen Reactions  ? Iohexol Anaphylaxis  ?   Desc: PT HAD CONTRAST REACTION YRS AGO NEEDS 13 HR PREP FOR ALL CONTRAST STUDIES ?  ? Codeine   ?  swelling  ? ? ?Patient Measurements: ?Height: '5\' 1"'$  (154.9 cm) ?Weight: 85.3 kg (188 lb 0.8 oz) ?IBW/kg (Calculated) : 47.8 ?Heparin Dosing Weight: 70kg  ? ?Vital Signs: ?Temp: 97 ?F (36.1 ?C) (03/22 0831) ?Temp Source: Temporal (03/22 0831) ?BP: 134/44 (03/22 0831) ?Pulse Rate: 66 (03/22 0831) ? ?Labs: ?Recent Labs  ?  12/14/21 ?0313 12/14/21 ?1555 12/14/21 ?1555 12/15/21 ?4270 12/15/21 ?1114 12/15/21 ?1241 12/16/21 ?0455 12/16/21 ?0456  ?HGB  --   --   --   --  10.5*  --  10.5*  --   ?HCT  --   --   --   --  34.7*  --  34.2*  --   ?PLT  --   --   --   --  172  --  168  --   ?APTT  --  61*  --   --   --   --   --   --   ?HEPARINUNFRC  --  0.22*   < > 0.50  --  0.54  --  0.55  ?CREATININE 1.68*  --   --  1.11*  --   --  1.11*  --   ? < > = values in this interval not displayed.  ? ? ? ?Estimated Creatinine Clearance: 42.7 mL/min (A) (by C-G formula based on SCr of 1.11 mg/dL (H)). ? ? ?Medical History: ?Past Medical History:  ?Diagnosis Date  ? Anxiety   ? Arthritis   ? "all over" (07/29/2017)  ? Bulging disc   ? "still have one in my neck" (07/29/2017)  ? Carotid arterial disease ([place])   ? a. s/p L intracranial ICA stenting ([place]).  ? Cataract   ? "left eye; didn't take it out cause of macular degeneration" (07/29/2017)  ? Cervical cancer (H[facility])   ? hx  ? Chronic kidney disease, stage III (moderate) (H[facility])   ? "only have 1 working kidney" (07/29/2017)  ? COPD (chronic obstructive pulmonary disease) ([place])   ? a. quit smoking in 2007.  ? Coronary artery disease   ? a. 2007 s/p MI and LAD stenting;  10/2006 CABG x 4: LIMA->LAD, VG->OM->dLCX, VG->RPL;  c. 07/2013 MV: no ischemia.  ? Depression   ? Esophageal reflux   ? Hepatitis 1968  ? "don't remember which one"  ?  History of blood transfusion 10/29/2006  ? "related to bypass OR"  ? History of echocardiogram   ? a. 01/2012 Echo: EF 55%, mildly dil LA/RV, mild MR, Ao Sclerosis, mod TR.  ? Hypothyroid   ? Insomnia   ? Macular degeneration   ? "left eye" (07/29/2017)  ? Mixed hyperlipidemia   ? NSTEMI (non-ST elevated myocardial infarction) ([place]) 08/2006; 10/2006  ? Osteoporosis   ? Pneumonia   ? "several times before I quit smoking" (07/29/2017)  ? Presence of permanent cardiac pacemaker   ? PVT (paroxysmal ventricular tachycardia)   ? Restless leg syndrome   ? Stroke [facility]) ?2008  ? denies residual on 07/29/2017  ? Symptomatic bradycardia   ? a. s/p BSX Ad[employer]io dual chamber PPM Beckie Salts).  ? Syncope and collapse   ? Unspecified hearing loss   ? ? ? ? ?Assessment: ?76yof admitted s/p fall  -  head CT negative for bleed, VQ negative for PE.  Hx Afib on apixaban pta - held on admit (LD 3/17) > CBC stable > resume anticoagulation.  Pharmacy asked to dose heparin while apixaban on hold for upcoming procedures.   ? ?aPTT levels and heparin levels correlating as off 3/20 PM check.  ? ?Heparin level this morning therapeutic at 0.55 on 1050 units/hr. CBC stable and wnl. No issues with bleeding per RN. Plan for cath today.  ? ?Goal of Therapy:  ?Heparin level 0.3-0.7 units/ml ?APTT 66-103 sec  ?Monitor platelets by anticoagulation protocol: Yes ?  ?Plan:  ?Continue heparin infusion at 1050 units/hr  ?Daily Heparin level and CBC  ?Monitor s/s bleeding  ?F/u transition back to [place] post cath ? ?Cathrine Muster, PharmD ?PGY2 Cardiology Pharmacy Resident ?Phone: [phone] ?12/16/2021  10:24 AM ? ?Please check [email or web address] for unit-specific pharmacy phone numbers. ? ?  ? ? ?

## 2021-12-16 NOTE — Progress Notes (Signed)
?  I reviewed TEE and cath findings with patient and her son Elta [place] (by telephone) ? ?We discussed that only option to address her severe tricuspid valve regurgitation might be to consider percutaneous TV replacement in [place]. ? ?She is adamant that she would not want this and pointed to her DNR bracelet.  ? ?Her son mark understands her wishes and agrees.  ? ?We discussed Palliative Care involvement as she will likely decompensate again in next few weeks off milrinone. She is OK with this but would like to stay in her current retirement community. ? ?I will consult Palliative Care.  ? ?Glori Bickers, MD  ?12:18 PM ? ?

## 2021-12-16 NOTE — Anesthesia Procedure Notes (Signed)
Procedure Name: [place] ?Date/Time: 12/16/2021 9:25 AM ?Performed by: Trinna Post., CRNA ?Pre-anesthesia Checklist: Patient identified, Emergency Drugs available, Suction available, Patient being monitored and Timeout performed ?Patient Re-evaluated:Patient Re-evaluated prior to induction ?Oxygen Delivery Method: Nasal cannula ?Preoxygenation: Pre-oxygenation with 100% oxygen ?Induction Type: IV induction ?Placement Confirmation: positive ETCO2 ? ? ? ? ?

## 2021-12-16 NOTE — Progress Notes (Signed)
?PROGRESS NOTE ? ? ? ?[name]  MWU:[id number] DOB: 1946-07-10 DOA: 12/11/2021 ?PCP: [facility] ? ? ?Brief Narrative:  ?76 y/o female admitted to the hospital after having a fall. She reported slipping and falling when she was reaching for her walker.  She felt as though she may have been somewhat sedated from pain medication she received earlier in the day.  Patient is chronically on Eliquis for atrial fibrillation.  Upon arrival to the emergency room, CT imaging did not indicate any fractures or intracranial hemorrhage.  She did have significant ecchymosis around her right orbit, right frontal scalp hematoma as well as some right conjunctival hemorrhage.  Vision remained intact.  Her work-up showed acute kidney injury, elevated LFTs, elevated BNP and cardiomegaly.  Echocardiogram showed preserved ejection fraction with severely reduced RV function.     ?  ?Transferred from [facility] to [facility] for HF consultation.  HF team managing her cardiac issues.  ? ?Assessment & Plan: ?  ?Active Problems: ?  Congestive heart failure (CHF) ([place]) ?  AKI (acute kidney injury) ([place]) ?  Atrial fibrillation, chronic ([place]) ?  Cirrhosis ([place]) ?  Elevated LFTs ?  Acute lower UTI ?  COPD (chronic obstructive pulmonary disease) ([place]) ?  Chronic kidney disease, stage III (moderate) (HCC) ?  CAD (coronary artery disease) S/P CABG ?  Essential hypertension ?  Hyperlipidemia ?  Hypothyroid ?  Seizure disorder ([place]) ?  Sinus node dysfunction (HCC) ?  Pacemaker -[employer] ?  Fall at home, initial encounter ?  DNR (do not resuscitate) ? ? ?Acute on chronic heart failure exacerbation; right ventriclar failure, POA ?Concurrent severe TR ?- Echo with EF 72-53%, RV systolic function severely reduced. Normal PA pressure. Elevated BNP of 1000 ?- VQ scan negative for PE ?3/21 suspect primary issue from RV dilatation with severe TR due to longstanding severe PAH in the setting of OSA/OHS/COPD.  Continue milrinone and  Lasix ?Cardiology following ?Right heart cath/TEE 12/16/21 - Cards recommending percutaneous Tricuspid valve replacement in [place] but patient/family do not want to go through any further procedures and are going to follow with palliative care. ? ?AKI on CKD stage IIIb (acute kidney injury) ([place]) ?Baseline creatinine of 1.7 ?Secondary to low cardiac output ?Continue monitoring and management HF as above ? ?Hypokalemia  ?In the setting of diuresis, continue to follow ? ?Atrial fibrillation, chronic ([place]) ?Heart rate is currently stable ?Holding Eliquis in light of recent fall and head injury ? ?Cirrhosis ([place]) ?New finding of possible nodularity of liver on CT imaging ?LFTs improving likely due to congestion/right heart failure  ?Viral hepatitis panel negative ? ?COPD (chronic obstructive pulmonary disease) ([place]) ?No shortness of breath or wheezing at this time ?Continue home regimen ? ?Acute UTI, POA ?Urine culture positive for klebsiella ?Continue ceftriaxone ?Follow-up culture sensitivities ?  ?CAD (coronary artery disease) S/P CABG ?No complaints of chest pain ?Holding statins  ?Beta-blocker, Imdur had been initially held due to soft blood pressures ?  ?Essential hypertension ?Holding metoprolol due to soft blood pressures ?  ?Hyperlipidemia ?Holding statins due to elevated LFT ?  ?Hypothyroid ?Continue Synthroid ?  ?Seizure disorder ([place]) ?Continue on Trileptal ?  ?Sinus node dysfunction (HCC) ?Status post permanent pacemaker ?  ?Pacemaker -[employer] ?Pacemaker insertion for sick sinus syndrome ?  ?Fall at home, initial encounter ?Patient reports that she lost her balance while walking in the middle of the night.  Denies any dizziness or loss of consciousness.  She felt "loopy" due to her  medications ?CT imaging did not indicate any fractures or intracranial injuries.  She is noted to have scalp hematoma as well as some conjunctival hemorrhage in her right eye ?Conjuctival hemorrhage improving. ?  ?DVT  prophylaxis: SCD ?Code Status: DNR ?Family Communication: None at bedside ? ?Status is: Inpatient ? ?Dispo: [name] from: Home ?             Anticipated d/c is to: SNF ?             Anticipated d/c date is: 48 to 72 hours ?             Patient currently not medically stable for discharge ? ?Consultants:  ?Cardiology/heart failure ? ?Procedures:  ?TEE ? ?Antimicrobials:  ?Ceftriaxone ? ?Subjective: ?No acute issues or events overnight ? ?Objective: ?Vitals:  ? 12/16/21 0450 12/16/21 0451 12/16/21 9892 12/16/21 0453  ?BP: 108/60     ?Pulse: 84     ?Resp: 18 15 (!) 28 (!) 25  ?Temp: (!) 97.2 ?F (36.2 ?C)     ?TempSrc: Oral     ?SpO2: 94%     ?Weight: 85.3 kg     ?Height:      ? ? ?Intake/Output Summary (Last 24 hours) at 12/16/2021 0723 ?Last data filed at 12/16/2021 1194 ?Gross per 24 hour  ?Intake 417 ml  ?Output 3750 ml  ?Net -3333 ml  ? ?Filed Weights  ? 12/14/21 0221 12/15/21 0449 12/16/21 0450  ?Weight: 92.8 kg 87.4 kg 85.3 kg  ? ? ?Examination: ? ?General:  Pleasantly resting in bed, No acute distress. ?Neck:  Without mass or deformity.  Trachea is midline. ?Lungs:  Clear to auscultate bilaterally without rhonchi, wheeze, or rales. ?Heart:  Regular rate and rhythm ?Abdomen:  Soft, nontender, nondistended.  Without guarding or rebound. ?Extremities: Without cyanosis, clubbing, edema, or obvious deformity. ? ?Data Reviewed: I have personally reviewed following labs and imaging studies ? ?CBC: ?Recent Labs  ?Lab 12/11/21 ?0653 12/12/21 ?0503 12/13/21 ?1740 12/15/21 ?1114 12/16/21 ?0455  ?WBC 9.1 9.4 6.9 7.3 7.1  ?NEUTROABS 7.2  --   --   --   --   ?HGB 10.6* 10.5* 10.2* 10.5* 10.5*  ?HCT 34.6* 34.7* 32.7* 34.7* 34.2*  ?MCV 85.4 85.5 83.2 82.6 82.0  ?PLT 182 184 189 172 168  ? ?Basic Metabolic Panel: ?Recent Labs  ?Lab 12/12/21 ?0503 12/13/21 ?8144 12/14/21 ?8185 12/15/21 ?6314 12/16/21 ?0455  ?NA 136 136 135 138 138  ?K 4.0 3.2* 3.3* 3.0* 4.9  ?CL 98 102 103 100 98  ?CO2 20* '22 22 29 '$ 33*  ?GLUCOSE 148* 120*  200* 142* 123*  ?BUN 70* 67* 57* 33* 26*  ?CREATININE 2.29* 2.11* 1.68* 1.11* 1.11*  ?CALCIUM 8.5* 8.6* 8.5* 8.0* 8.6*  ?MG  --   --   --  1.9  --   ? ?GFR: ?Estimated Creatinine Clearance: 42.7 mL/min (A) (by C-G formula based on SCr of 1.11 mg/dL (H)). ?Liver Function Tests: ?Recent Labs  ?Lab 12/11/21 ?0653 12/12/21 ?0503 12/13/21 ?9702 12/15/21 ?6378  ?AST 109* 102* 66* 29  ?ALT 140* 151* 124* 73*  ?ALKPHOS 249* 265* 265* 201*  ?BILITOT 1.8* 2.3* 1.0 1.2  ?PROT 6.9 6.8 6.0* 6.0*  ?ALBUMIN 3.7 3.7 3.3* 3.2*  ? ?No results for input(s): LIPASE, AMYLASE in the last 168 hours. ?Recent Labs  ?Lab 12/11/21 ?1243  ?AMMONIA 49*  ? ?Coagulation Profile: ?No results for input(s): INR, PROTIME in the last 168 hours. ?Cardiac Enzymes: ?No results for input(s): CKTOTAL,  CKMB, CKMBINDEX, TROPONINI in the last 168 hours. ?BNP (last 3 results) ?No results for input(s): PROBNP in the last 8760 hours. ?HbA1C: ?Recent Labs  ?  12/16/21 ?0456  ?HGBA1C 7.0*  ? ?CBG: ?Recent Labs  ?Lab 12/12/21 ?2109  ?GLUCAP 144*  ? ?Lipid Profile: ?No results for input(s): CHOL, HDL, LDLCALC, TRIG, CHOLHDL, LDLDIRECT in the last 72 hours. ?Thyroid Function Tests: ?Recent Labs  ?  12/16/21 ?0455 12/16/21 ?0456  ?TSH  --  7.399*  ?FREET4 1.05  --   ? ?Anemia Panel: ?No results for input(s): VITAMINB12, FOLATE, FERRITIN, TIBC, IRON, RETICCTPCT in the last 72 hours. ?Sepsis Labs: ?Recent Labs  ?Lab 12/11/21 ?7482  ?LATICACIDVEN 1.9  ? ? ?Recent Results (from the past 240 hour(s))  ?Resp Panel by RT-PCR (Flu A&B, Covid) Nasopharyngeal Swab     Status: None  ? Collection Time: 12/11/21  8:48 AM  ? Specimen: Nasopharyngeal Swab; Nasopharyngeal(NP) swabs in vial transport medium  ?Result Value Ref Range Status  ? SARS Coronavirus 2 by RT PCR NEGATIVE NEGATIVE Final  ?  Comment: (NOTE) ?SARS-CoV-2 target nucleic acids are NOT DETECTED. ? ?The SARS-CoV-2 RNA is generally detectable in upper respiratory ?specimens during the acute phase of infection. The  lowest ?concentration of SARS-CoV-2 viral copies this assay can detect is ?138 copies/mL. A negative result does not preclude SARS-Cov-2 ?infection and should not be used as the sole basis for treatment or ?other patien

## 2021-12-17 DIAGNOSIS — Z7189 Other specified counseling: Secondary | ICD-10-CM

## 2021-12-17 DIAGNOSIS — I509 Heart failure, unspecified: Secondary | ICD-10-CM | POA: Diagnosis not present

## 2021-12-17 DIAGNOSIS — K7460 Unspecified cirrhosis of liver: Secondary | ICD-10-CM | POA: Diagnosis not present

## 2021-12-17 DIAGNOSIS — I482 Chronic atrial fibrillation, unspecified: Secondary | ICD-10-CM | POA: Diagnosis not present

## 2021-12-17 DIAGNOSIS — Z66 Do not resuscitate: Secondary | ICD-10-CM

## 2021-12-17 DIAGNOSIS — I50810 Right heart failure, unspecified: Secondary | ICD-10-CM | POA: Diagnosis not present

## 2021-12-17 DIAGNOSIS — N390 Urinary tract infection, site not specified: Secondary | ICD-10-CM | POA: Diagnosis not present

## 2021-12-17 LAB — CBC
HCT: 34.1 % — ABNORMAL LOW (ref 36.0–46.0)
Hemoglobin: 10.8 g/dL — ABNORMAL LOW (ref 12.0–15.0)
MCH: 26 pg (ref 26.0–34.0)
MCHC: 31.7 g/dL (ref 30.0–36.0)
MCV: 82.2 fL (ref 80.0–100.0)
Platelets: 157 10*3/uL (ref 150–400)
RBC: 4.15 MIL/uL (ref 3.87–5.11)
RDW: 19.7 % — ABNORMAL HIGH (ref 11.5–15.5)
WBC: 7.2 10*3/uL (ref 4.0–10.5)
nRBC: 0 % (ref 0.0–0.2)

## 2021-12-17 LAB — BASIC METABOLIC PANEL
Anion gap: 7 (ref 5–15)
Anion gap: 10 (ref 5–15)
BUN: 29 mg/dL — ABNORMAL HIGH (ref 8–23)
BUN: 27 mg/dL — ABNORMAL HIGH (ref 8–23)
CO2: 30 mmol/L (ref 22–32)
CO2: 30 mmol/L (ref 22–32)
Calcium: 8.7 mg/dL — ABNORMAL LOW (ref 8.9–10.3)
Calcium: 9.4 mg/dL (ref 8.9–10.3)
Chloride: 99 mmol/L (ref 98–111)
Chloride: 94 mmol/L — ABNORMAL LOW (ref 98–111)
Creatinine, Ser: 1.4 mg/dL — ABNORMAL HIGH (ref 0.44–1.00)
Creatinine, Ser: 1.4 mg/dL — ABNORMAL HIGH (ref 0.44–1.00)
GFR, Estimated: 39 mL/min — ABNORMAL LOW (ref >60)
GFR, Estimated: 39 mL/min — ABNORMAL LOW (ref >60)
Glucose, Bld: 111 mg/dL — ABNORMAL HIGH (ref 70–99)
Glucose, Bld: 133 mg/dL — ABNORMAL HIGH (ref 70–99)
Potassium: 5.2 mmol/L — ABNORMAL HIGH (ref 3.5–5.1)
Potassium: 5.1 mmol/L (ref 3.5–5.1)
Sodium: 136 mmol/L (ref 135–145)
Sodium: 134 mmol/L — ABNORMAL LOW (ref 135–145)

## 2021-12-17 LAB — COOXEMETRY PANEL
Carboxyhemoglobin: 2 % — ABNORMAL HIGH (ref 0.5–1.5)
Methemoglobin: 0.9 % (ref 0.0–1.5)
O2 Saturation: 71.5 %
Total hemoglobin: 11.3 g/dL — ABNORMAL LOW (ref 12.0–16.0)

## 2021-12-17 LAB — MAGNESIUM: Magnesium: 2.1 mg/dL (ref 1.7–2.4)

## 2021-12-17 MED ORDER — INSULIN ASPART 100 UNIT/ML IJ SOLN
0.0000 [IU] | Freq: Three times a day (TID) | INTRAMUSCULAR | Status: DC
  Administered 2021-12-18: 1 [IU] via SUBCUTANEOUS

## 2021-12-17 MED ORDER — ATORVASTATIN CALCIUM 40 MG PO TABS
40.0000 mg | ORAL_TABLET | Freq: Every day | ORAL | Status: DC
  Administered 2021-12-17 – 2021-12-18 (×2): 40 mg via ORAL
  Filled 2021-12-17 (×2): qty 1

## 2021-12-17 MED ORDER — METOLAZONE 2.5 MG PO TABS
2.5000 mg | ORAL_TABLET | Freq: Once | ORAL | Status: AC
  Administered 2021-12-17: 2.5 mg via ORAL
  Filled 2021-12-17: qty 1

## 2021-12-17 NOTE — Care Management Important Message (Signed)
Important Message ? ?Patient Details  ?Name: [name] ?MRN: [id number] ?Date of Birth: 05-09-1946 ? ? ?Medicare Important Message Given:  Yes ? ? ? ? ?[name] ?12/17/2021, 9:18 AM ?

## 2021-12-17 NOTE — Progress Notes (Signed)
?PROGRESS NOTE ? ? ? ?[name]  OMB:[id number] DOB: 01/28/1946 DOA: 12/11/2021 ?PCP: [facility] ? ? ?Brief Narrative:  ?76 y/o female admitted to the hospital after having a fall. She reported slipping and falling when she was reaching for her walker.  She felt as though she may have been somewhat sedated from pain medication she received earlier in the day.  Patient is chronically on Eliquis for atrial fibrillation.  Upon arrival to the emergency room, CT imaging did not indicate any fractures or intracranial hemorrhage.  She did have significant ecchymosis around her right orbit, right frontal scalp hematoma as well as some right conjunctival hemorrhage.  Vision remained intact.  Her work-up showed acute kidney injury, elevated LFTs, elevated BNP and cardiomegaly.  Echocardiogram showed preserved ejection fraction with severely reduced RV function.     ?  ?Transferred from [facility] to [facility] for HF consultation.  HF team managing her cardiac issues.  ? ?Assessment & Plan: ?  ?Active Problems: ?  Congestive heart failure (CHF) ([place]) ?  AKI (acute kidney injury) ([place]) ?  Atrial fibrillation, chronic ([place]) ?  Cirrhosis ([place]) ?  Elevated LFTs ?  Acute lower UTI ?  COPD (chronic obstructive pulmonary disease) ([place]) ?  Chronic kidney disease, stage III (moderate) (HCC) ?  CAD (coronary artery disease) S/P CABG ?  Essential hypertension ?  Hyperlipidemia ?  Hypothyroid ?  Seizure disorder ([place]) ?  Sinus node dysfunction (HCC) ?  Pacemaker -[employer] ?  Fall at home, initial encounter ?  DNR (do not resuscitate) ? ? ?Goals of care  ?-Lengthy discussion at bedside today about goals of care ?-Appreciate palliative care insight and recommendations ?-Discussed planning for future, attempts to limit hospitalizations and interventions given only real treatment moving forward would be tricuspid valve replacement which she does not wish to undergo. ? ?Acute on chronic heart failure exacerbation; right  ventriclar failure, POA ?Concurrent severe TR ?- Echo with EF 45-36%, RV systolic function severely reduced. Normal PA pressure. Elevated BNP of 1000 ?- VQ scan negative for PE ?- 3/21 suspect primary issue from RV dilatation with severe TR due to longstanding severe PAH in the setting of OSA/OHS/COPD.  Continue milrinone and Lasix ?- Right heart cath/TEE 12/16/21 - Cards recommending percutaneous Tricuspid valve replacement in [place] but patient/family do not want to go through any further procedures and are going to follow with palliative care. ?-Continues on milrinone drip per cardiology, attempting to wean off over the next 48 hours as tolerated ? ?AKI on CKD stage IIIb (acute kidney injury) ([place]) ?Creatinine labile but generally downtrending in the setting of diuresis given above ?Secondary to low cardiac output ?Continue monitoring and management HF as above ? ?Hypokalemia  ?Continue to follow, minimally elevated today, expected to downtrend given ongoing IV Lasix ? ?Atrial fibrillation, chronic ([place]) ?Heart rate is currently stable ?Holding Eliquis in light of recent fall and head injury ? ?Cirrhosis ([place]) ?New finding of possible nodularity of liver on CT imaging ?LFTs improving likely due to congestion/right heart failure  ?Viral hepatitis panel negative ? ?COPD (chronic obstructive pulmonary disease) ([place]) ?No shortness of breath or wheezing at this time ?Continue home regimen ? ?Acute UTI, POA ?Urine culture positive for klebsiella ?Continue ceftriaxone ?Follow-up culture sensitivities ?  ?CAD (coronary artery disease) S/P CABG ?No complaints of chest pain ?Holding statins  ?Beta-blocker, Imdur had been initially held due to soft blood pressures ?  ?Essential hypertension ?Holding metoprolol due to soft blood pressures ?  ?  Hyperlipidemia ?Holding statins due to elevated LFT ?  ?Hypothyroid ?Continue Synthroid ?  ?Seizure disorder ([place]) ?Continue on Trileptal ?  ?Sinus node dysfunction (HCC) ?Status post  permanent pacemaker ?  ?Pacemaker -[employer] ?Pacemaker insertion for sick sinus syndrome ?  ?Fall at home, initial encounter ?Imaging unremarkable ?Conjuctival and scalp hemorrhage improving. ?  ?DVT prophylaxis: SCD ?Code Status: DNR ?Family Communication: None at bedside ? ?Status is: Inpatient ? ?Dispo: [name] from: Home ?             Anticipated d/c is to: SNF ?             Anticipated d/c date is: 24-48 hours ?             Patient currently not medically stable for discharge ? ?Consultants:  ?Cardiology/heart failure ? ?Procedures:  ?TEE ? ?Antimicrobials:  ?Ceftriaxone completed ? ?Subjective: ?No acute issues or events overnight, denies nausea vomiting diarrhea constipation headache fevers chills or chest pain ? ?Objective: ?Vitals:  ? 12/17/21 0400 12/17/21 0724 12/17/21 0818 12/17/21 0830  ?BP: (!) 115/50 (!) 100/55 116/64 111/64  ?Pulse: 60 67  60  ?Resp: '18 20 18 20  '$ ?Temp: 97.8 ?F (36.6 ?C) 97.6 ?F (36.4 ?C) (!) 97.5 ?F (36.4 ?C) (!) 97.5 ?F (36.4 ?C)  ?TempSrc: Oral Oral Oral Oral  ?SpO2: 90% 92%    ?Weight: 87.4 kg     ?Height:      ? ? ?Intake/Output Summary (Last 24 hours) at 12/17/2021 1138 ?Last data filed at 12/17/2021 0900 ?Gross per 24 hour  ?Intake 2786.54 ml  ?Output 1900 ml  ?Net 886.54 ml  ? ? ?Filed Weights  ? 12/15/21 0449 12/16/21 0450 12/17/21 0400  ?Weight: 87.4 kg 85.3 kg 87.4 kg  ? ? ?Examination: ? ?General:  Pleasantly resting in bed, No acute distress. ?Neck:  Without mass or deformity.  Trachea is midline. ?Lungs:  Clear to auscultate bilaterally without rhonchi, wheeze, or rales. ?Heart:  Regular rate and rhythm ?Abdomen:  Soft, nontender, nondistended.  Without guarding or rebound. ?Extremities: Without cyanosis, clubbing, edema, or obvious deformity. ? ?Data Reviewed: I have personally reviewed following labs and imaging studies ? ?CBC: ?Recent Labs  ?Lab 12/11/21 ?0653 12/12/21 ?0503 12/13/21 ?2330 12/15/21 ?1114 12/16/21 ?0455 12/16/21 ?1102 12/17/21 ?0762   ?WBC 9.1 9.4 6.9 7.3 7.1  --  7.2  ?NEUTROABS 7.2  --   --   --   --   --   --   ?HGB 10.6* 10.5* 10.2* 10.5* 10.5* 12.6  12.6 10.8*  ?HCT 34.6* 34.7* 32.7* 34.7* 34.2* 37.0  37.0 34.1*  ?MCV 85.4 85.5 83.2 82.6 82.0  --  82.2  ?PLT 182 184 189 172 168  --  157  ? ? ?Basic Metabolic Panel: ?Recent Labs  ?Lab 12/13/21 ?0307 12/14/21 ?0313 12/15/21 ?2633 12/16/21 ?0455 12/16/21 ?1102 12/17/21 ?3545  ?NA 136 135 138 138 140  140 136  ?K 3.2* 3.3* 3.0* 4.9 4.8  4.7 5.2*  ?CL 102 103 100 98  --  99  ?CO2 '22 22 29 '$ 33*  --  30  ?GLUCOSE 120* 200* 142* 123*  --  111*  ?BUN 67* 57* 33* 26*  --  29*  ?CREATININE 2.11* 1.68* 1.11* 1.11*  --  1.40*  ?CALCIUM 8.6* 8.5* 8.0* 8.6*  --  8.7*  ?MG  --   --  1.9  --   --   --   ? ? ?GFR: ?Estimated Creatinine Clearance: 34.3 mL/min (A) (  by C-G formula based on SCr of 1.4 mg/dL (H)). ?Liver Function Tests: ?Recent Labs  ?Lab 12/11/21 ?0653 12/12/21 ?0503 12/13/21 ?1884 12/15/21 ?1660  ?AST 109* 102* 66* 29  ?ALT 140* 151* 124* 73*  ?ALKPHOS 249* 265* 265* 201*  ?BILITOT 1.8* 2.3* 1.0 1.2  ?PROT 6.9 6.8 6.0* 6.0*  ?ALBUMIN 3.7 3.7 3.3* 3.2*  ? ? ?No results for input(s): LIPASE, AMYLASE in the last 168 hours. ?Recent Labs  ?Lab 12/11/21 ?1243  ?AMMONIA 49*  ? ? ?Coagulation Profile: ?No results for input(s): INR, PROTIME in the last 168 hours. ?Cardiac Enzymes: ?No results for input(s): CKTOTAL, CKMB, CKMBINDEX, TROPONINI in the last 168 hours. ?BNP (last 3 results) ?No results for input(s): PROBNP in the last 8760 hours. ?HbA1C: ?Recent Labs  ?  12/16/21 ?0456  ?HGBA1C 7.0*  ? ? ?CBG: ?Recent Labs  ?Lab 12/12/21 ?2109  ?GLUCAP 144*  ? ? ?Lipid Profile: ?No results for input(s): CHOL, HDL, LDLCALC, TRIG, CHOLHDL, LDLDIRECT in the last 72 hours. ?Thyroid Function Tests: ?Recent Labs  ?  12/16/21 ?0455 12/16/21 ?0456  ?TSH  --  7.399*  ?FREET4 1.05  --   ? ? ?Anemia Panel: ?No results for input(s): VITAMINB12, FOLATE, FERRITIN, TIBC, IRON, RETICCTPCT in the last 72 hours. ?Sepsis  Labs: ?Recent Labs  ?Lab 12/11/21 ?6301  ?LATICACIDVEN 1.9  ? ? ? ?Recent Results (from the past 240 hour(s))  ?Resp Panel by RT-PCR (Flu A&B, Covid) Nasopharyngeal Swab     Status: None  ? Collection Time: 12/11/21

## 2021-12-17 NOTE — Anesthesia Postprocedure Evaluation (Signed)
Anesthesia Post Note ? ?Patient: [name] ? ?Procedure(s) Performed: TRANSESOPHAGEAL ECHOCARDIOGRAM (TEE) ? ?  ? ?Patient location during evaluation: PACU ?Anesthesia Type: MAC ?Level of consciousness: awake and alert ?Pain management: pain level controlled ?Vital Signs Assessment: post-procedure vital signs reviewed and stable ?Respiratory status: spontaneous breathing, nonlabored ventilation, respiratory function stable and patient connected to nasal cannula oxygen ?Cardiovascular status: stable and blood pressure returned to baseline ?Postop Assessment: no apparent nausea or vomiting ?Anesthetic complications: no ? ? ?No notable events documented. ? ?Last Vitals:  ?Vitals:  ? 12/17/21 0818 12/17/21 0830  ?BP: 116/64 111/64  ?Pulse:    ?Resp: 18   ?Temp: (!) 36.4 ?C (!) 36.4 ?C  ?SpO2:    ?  ?Last Pain:  ?Vitals:  ? 12/17/21 0830  ?TempSrc: Oral  ?PainSc:   ? ? ?  ?  ?  ?  ?  ?  ? ?Mir Fullilove S ? ? ? ? ?

## 2021-12-17 NOTE — Progress Notes (Signed)
Physical Therapy Treatment ?Patient Details ?Name: [name] ?MRN: [id number] ?DOB: 01-Dec-1945 ?Today's Date: 12/17/2021 ? ? ?History of Present Illness The pt is a 76 yo female presenting 3/17 from her home in a retirement community after sustaining a fall getting out of bed. Upon arrival, pt also noted to have AMS, hypoxia, elevated LFT and BNP. Pt transferred to [facility] 3/18 for continued HF evaluation.  Plan for need RHC and TEE once more diuresed. PMH includes: COPD, CHF, anxiety, arthritis, CAD s/p ICA and LAD stenting and CABG in 2008, CKD III, NSTEMI, PVT, stroke, and bradycardia. ? ?  ?PT Comments  ? ? The pt was motivated to work with therapies at this time as she states she is hopeful to return to her retirement community (where she must be able to ambulate to the bathroom and shower without assist). The pt was able to make great progress with ambulation distance in the room and did progress to hallway ambulation as well at this time. She was able to complete mobility with rollator while on RA with SpO2 > 94%. The pt continues to present with general weakness and progressive shakiness in BLE with fatigue and is dependent on UE support for balance with gait. Will benefit from continued PT acutely to allow for pt to progress towards her goal of return home. At this time, would recommend frequent assist/supervision for safety.  ?   ?Recommendations for follow up therapy are one component of a multi-disciplinary discharge planning process, led by the attending physician.  Recommendations may be updated based on patient status, additional functional criteria and insurance authorization. ? ?Follow Up Recommendations ? Home health PT ?  ?  ?Assistance Recommended at Discharge Frequent or constant Supervision/Assistance  ?Patient can return home with the following A little help with walking and/or transfers;A little help with bathing/dressing/bathroom;Assist for transportation;Help with stairs or ramp for  entrance ?  ?Equipment Recommendations ? None recommended by PT  ?  ?Recommendations for Other Services   ? ? ?  ?Precautions / Restrictions Precautions ?Precautions: Fall ?Precaution Comments: x2 falls PTA ?Restrictions ?Weight Bearing Restrictions: No  ?  ? ?Mobility ? Bed Mobility ?Overal bed mobility: Needs Assistance ?  ?  ?  ?  ?  ?  ?General bed mobility comments: pt OOB in chair at start and end of session ?  ? ?Transfers ?Overall transfer level: Needs assistance ?Equipment used: Rollator (4 wheels) ?Transfers: Sit to/from Stand ?Sit to Stand: Min guard ?  ?  ?  ?  ?  ?General transfer comment: minG to power up from recliner and low toilet ?  ? ?Ambulation/Gait ?Ambulation/Gait assistance: Min assist ?Gait Distance (Feet): 15 Feet (+ 45 ft) ?Assistive device: Rollator (4 wheels) ?Gait Pattern/deviations: Step-to pattern, Decreased stride length, Shuffle, Narrow base of support ?Gait velocity: decreased ?Gait velocity interpretation: <1.31 ft/sec, indicative of household ambulator ?  ?General Gait Details: pt with slow and unsteady gait, dependent on BUE support on rollator. no overt LOB and SpO2 remained in 90s on RA. ambulated to bathroom then hallway with no seated rest but progressive shaking in BLE ? ? ? ?  ?Balance Overall balance assessment: History of Falls, Needs assistance ?Sitting-balance support: Feet supported, No upper extremity supported ?Sitting balance-Leahy Scale: Good ?Sitting balance - Comments: no assist given ?  ?Standing balance support: Bilateral upper extremity supported, During functional activity, Reliant on assistive device for balance ?Standing balance-Leahy Scale: Fair ?Standing balance comment: fair without UE support for static stance, BUE support  for gait ?  ?  ?  ?  ?  ?  ?  ?  ?  ?  ?  ?  ? ?  ?Cognition Arousal/Alertness: Awake/alert ?Behavior During Therapy: [facility] for tasks assessed/performed ?Overall Cognitive Status: Impaired/Different from baseline ?Area of Impairment:  Safety/judgement ?  ?  ?  ?  ?  ?  ?  ?  ?  ?  ?  ?  ?Safety/Judgement: Decreased awareness of deficits ?  ?  ?General Comments: pt with improved attention and understanding of safety this session. Remains adamant about returning home with slightly reduced insight to need for assist. ?  ?  ? ?  ?Exercises   ? ?  ?General Comments General comments (skin integrity, edema, etc.): VSS on 2L upon arrival, decreased to RA with SpO2 stable ?  ?  ? ?Pertinent Vitals/Pain Pain Assessment ?Pain Assessment: Faces ?Faces Pain Scale: Hurts little more ?Pain Location: headache ?Pain Descriptors / Indicators: Headache ?Pain Intervention(s): Monitored during session  ? ? ? ?PT Goals (current goals can now be found in the care plan section) Acute Rehab PT Goals ?PT Goal Formulation: With patient ?Time For Goal Achievement: 12/26/21 ?Potential to Achieve Goals: Good ?Progress towards PT goals: Progressing toward goals ? ?  ?Frequency ? ? ? Min 3X/week ? ? ? ?  ?PT Plan Current plan remains appropriate  ? ? ?   ?AM-PAC PT "6 Clicks" Mobility   ?Outcome Measure ? Help needed turning from your back to your side while in a flat bed without using bedrails?: A Little ?Help needed moving from lying on your back to sitting on the side of a flat bed without using bedrails?: A Little ?Help needed moving to and from a bed to a chair (including a wheelchair)?: A Little ?Help needed standing up from a chair using your arms (e.g., wheelchair or bedside chair)?: A Little ?Help needed to walk in hospital room?: A Little ?Help needed climbing 3-5 steps with a railing? : A Lot ?6 Click Score: 17 ? ?  ?End of Session Equipment Utilized During Treatment: Gait belt ?Activity Tolerance: Patient tolerated treatment well ?Patient left: with call bell/phone within reach;in chair ?Nurse Communication: Mobility status ?PT Visit Diagnosis: Unsteadiness on feet (R26.81);Other abnormalities of gait and mobility (R26.89);Muscle weakness (generalized)  (M62.81);History of falling (Z91.81) ?  ? ? ?Time: 1448-1856 ?PT Time Calculation (min) (ACUTE ONLY): 10 min ? ?Charges:  $Therapeutic Exercise: 8-22 mins          ?          ? ?West Carbo, PT, DPT  ? ?Acute Rehabilitation Department ?Pager #: [phone] - [phone] ? ? ?[name] ?12/17/2021, 3:09 PM ? ?

## 2021-12-17 NOTE — Progress Notes (Addendum)
? ? Advanced Heart Failure Rounding Note ? ?PCP-Cardiologist: None  ? ?Subjective:   ?3/19 Continued on milrinone and started on lasix drip.  ?3/20 Started on po amio to suppress PVCs. PICC placed. Diuresed with lasix drip.  ?03/22 Milrinone decreased to 0.125. Lasix gtt stopped. IV lasix added back post cath d/t elevated filling pressures. ? ?Remains on milrinone 0.125 mcg. CO-OX 71.5%. CVP 8. On IV lasix 80 mg BID. ? ?Is/Os not accurate with multiple unmeasured voids. 1.2L UOP charted. Weighed in bed. Scr 1.1>1.40. K 5.2. ? ?Not feeling well today. Reports nausea and abdominal pain with cramps. Feels like she needs to have a bowel movement. No dyspnea at rest. ? ? ?RHC on milrinone 0.125 ?RA = 19 (with v-waves to 30)  - looked like RV tracing  ?RV = 41/17 ?PA = 46/17 (31) ?PCW =24 (v waves to 34) ?Fick cardiac output/index = 4.5/2.5 ?PVR = 1.5 WU ?PAPi = 1.3 ?Ao sat = 97% ?PA sat = 59%, 60% ?  ?Assessment: ?  ?1. Mild pulmonary HTN ?2. Severe TR ?3. Moderately elevated left-sided pressures ? ? ?TEE ?FINDINGS: ?LEFT VENTRICLE: EF = 60%. No regional wall motion abnormalities. ?RIGHT VENTRICLE: Mildly dilated. Mildly HK (on milrinone 0.125) ?LEFT ATRIUM: Mildly dilated ?LEFT ATRIAL APPENDAGE: No thrombus.  ?RIGHT ATRIUM: Severely dilated ?AORTIC VALVE:  Trileaflet. Trivial AI ?MITRAL VALVE:    Normal. Mild to moderate MR ?TRICUSPID VALVE: Poor coaptation. Suspect component of pacer wire restriction. Wide open TR willing entire RA ?PULMONIC VALVE: Moderate PR  ?INTERATRIAL SEPTUM: No PFO or ASD. Septum bulges R to left ?PERICARDIUM: No effusion ?DESCENDING AORTA: Severe plaque ?  ?  ? ?Objective:   ?Weight Range: ?87.4 kg ?Body mass index is 36.41 kg/m?.  ? ?Vital Signs:   ?Temp:  [97.5 ?F (36.4 ?C)-98.7 ?F (37.1 ?C)] 97.5 ?F (36.4 ?C) (03/23 0830) ?Pulse Rate:  [0-74] 67 (03/23 0724) ?Resp:  [14-31] 18 (03/23 0818) ?BP: (94-152)/(41-131) 111/64 (03/23 0830) ?SpO2:  [90 %-98 %] 92 % (03/23 0724) ?Weight:  [87.4 kg]  87.4 kg (03/23 0400) ?Last BM Date : 12/16/21 (per report from day shift RN) ? ?Weight change: ?Filed Weights  ? 12/15/21 0449 12/16/21 0450 12/17/21 0400  ?Weight: 87.4 kg 85.3 kg 87.4 kg  ? ? ?Intake/Output:  ? ?Intake/Output Summary (Last 24 hours) at 12/17/2021 0900 ?Last data filed at 12/17/2021 [phone] ?Gross per 24 hour  ?Intake 2606.54 ml  ?Output 1200 ml  ?Net 1406.54 ml  ?  ? ? ?Physical Exam  ? ?General:  Appears weak. Lying in bed. ?HEENT: forehead/R Periobital eccyhmoses ?Neck: supple. JVP 10 cm. Carotids 2+ bilat; no bruits.  ?Cor: PMI nondisplaced. Regular rate & rhythm. No rubs, gallops, 3/6 TR murmur ?Lungs: clear ?Abdomen: soft, nondistended, abdomen not tender with palpation but grimacing in response to cramps ?Extremities: no cyanosis, clubbing, rash, 1-2+ edema, + UNNA ?Neuro: alert & orientedx3, cranial nerves grossly intact. moves all 4 extremities w/o difficulty. Affect pleasant ? ? ? ?Telemetry  ? ?A paced 50s-60s, occasional PVCs, ? 1 19 beat run NSVT vs artifact ? ? ?Labs  ?  ?CBC ?Recent Labs  ?  12/16/21 ?0455 12/16/21 ?1102 12/17/21 ?6767  ?WBC 7.1  --  7.2  ?HGB 10.5* 12.6  12.6 10.8*  ?HCT 34.2* 37.0  37.0 34.1*  ?MCV 82.0  --  82.2  ?PLT 168  --  157  ? ?Basic Metabolic Panel ?Recent Labs  ?  12/15/21 ?2094 12/16/21 ?0455 12/16/21 ?1102 12/17/21 ?7096  ?NA  138 138 140  140 136  ?K 3.0* 4.9 4.8  4.7 5.2*  ?CL 100 98  --  99  ?CO2 29 33*  --  30  ?GLUCOSE 142* 123*  --  111*  ?BUN 33* 26*  --  29*  ?CREATININE 1.11* 1.11*  --  1.40*  ?CALCIUM 8.0* 8.6*  --  8.7*  ?MG 1.9  --   --   --   ? ?Liver Function Tests ?Recent Labs  ?  12/15/21 ?1610  ?AST 29  ?ALT 73*  ?ALKPHOS 201*  ?BILITOT 1.2  ?PROT 6.0*  ?ALBUMIN 3.2*  ? ?No results for input(s): LIPASE, AMYLASE in the last 72 hours. ?Cardiac Enzymes ?No results for input(s): CKTOTAL, CKMB, CKMBINDEX, TROPONINI in the last 72 hours. ? ?BNP: ?BNP (last 3 results) ?Recent Labs  ?  12/11/21 ?0653  ?BNP 1,055.0*  ? ? ?ProBNP (last 3  results) ?No results for input(s): PROBNP in the last 8760 hours. ? ? ?D-Dimer ?No results for input(s): DDIMER in the last 72 hours. ?Hemoglobin A1C ?Recent Labs  ?  12/16/21 ?0456  ?HGBA1C 7.0*  ? ?Fasting Lipid Panel ?No results for input(s): CHOL, HDL, LDLCALC, TRIG, CHOLHDL, LDLDIRECT in the last 72 hours. ?Thyroid Function Tests ?Recent Labs  ?  12/16/21 ?0456  ?TSH 7.399*  ? ? ?Other results: ? ? ?Imaging  ? ? ?CARDIAC CATHETERIZATION ? ?Result Date: 12/16/2021 ?Findings: On milrinone 0.125 mcg/kg/min RA = 19 (with v-waves to 30)  - looked like RV tracing RV = 41/17 PA = 46/17 (31) PCW =24 (v waves to 34) Fick cardiac output/index = 4.5/2.5 PVR = 1.5 WU PAPi = 1.3 Ao sat = 97% PA sat = 59%, 60% Assessment: 1. Mild pulmonary HTN 2. Severe TR 3. Moderately elevated left-sided pressures Plan/Discussion: Will d/w structural team about potential percutaneous TV. Continue diuresis. Wean milrinone . Glori Bickers, MD 12:15 PM  ? ? ?Medications:   ? ? ?Scheduled Medications: ? acidophilus  1 capsule Oral Daily  ? amiodarone  200 mg Oral BID  ? apixaban  5 mg Oral BID  ? Chlorhexidine Gluconate Cloth  6 each Topical Daily  ? diclofenac Sodium  4 g Topical QID  ? fluticasone furoate-vilanterol  1 puff Inhalation Daily  ? And  ? umeclidinium bromide  1 puff Inhalation Daily  ? furosemide  80 mg Intravenous BID  ? gabapentin  300 mg Oral TID  ? Gerhardt's butt cream   Topical BID  ? levothyroxine  75 mcg Oral Q0600  ? Oxcarbazepine  300 mg Oral BID  ? pantoprazole  40 mg Oral QHS  ? potassium chloride  40 mEq Oral BID  ? pramipexole  1 mg Oral QHS  ? sertraline  100 mg Oral Daily  ? sodium chloride flush  10-40 mL Intracatheter Q12H  ? spironolactone  25 mg Oral Daily  ? ? ?Infusions: ? milrinone 0.125 mcg/kg/min (12/17/21 0100)  ? ? ?PRN Medications: ?acetaminophen **OR** acetaminophen, acetaminophen, ondansetron **OR** ondansetron (ZOFRAN) IV, polyvinyl alcohol, sodium chloride flush ? ? ? ?Patient Profile  ? ?  ?[name] is a 76 y.o. 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 Lovena Le 2014), chronic diastolic HF.   ? ?[facility] from [facility] for HF [facility].   ? ?Assessment/Plan  ?1. 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 D-shaped septum. No significant PAH with TR tracing ?- suspect primary issue here is RV dilation with severe TR either due to longstanding severe PAH in setting of OSA/OHS/COPD (but PAH mild on previous echos dating back to 2013) or leaflet tethering from pacer leads ?- RHC: RA 19 ( v 30), PA 46/17 (31), PCWP 24 (v waves 34), Fick CO/CI 4.5/2.5, PAPi 1.3, PA sat 59%/60% ?- TEE: EF 60%, RV mildly HK on mil 0.125, wide open TR with poor coaptation, suspect component of pacer wire restriction ?- Dr. Haroldine Laws discussed consideration of percutaneous TV replacement in [place] with patient and her son. Patient is adamant she does not want to proceed with this. Wishes to remain DNR. Palliative care consulted.  ?-CO-OX 71.5% on milrinone 0.125. On IV lasix 80 mg BID. CVP 8 but elevated filling pressures on RHC yesterday. Continue IV diuresis for now but check labs this afternoon. Will need to stop if Scr continues to trend up.  ?- Continue spiro 25 mg daily for now. K 5.2. Stopped K supplement. Check CMET this afternoon. ? ?2. CAD s/p CABG ?- no s/s angina ?- off ASA with DOAC. Off statin with shock liver ?  ?3. AKI on CKD 3a ?- baseline SCr 1.3-1.5 ?- has scarred R kidney on u/s ?- SCr peaked 2.3-->1.1>1.4 today ?- Likely due to ATN from shock, completed treatment for Klebsiella UTI with ceftriaxone ?- Continue hemodynamic support ?  ?4. 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  ?5. PAF with tachy-brady syndrome ?- in NSR ?- On apixaban 5 mg  BID ? ?6. Fall with head trauma ?- Head CT negative for bleed ?  ?7. PVCs ?- High PVC burden. 3/20 Started on amio 200 mg twice a day to help suppress.  ?- PVC burden significantly lower. Continue. ?- ? 19 beat run

## 2021-12-17 NOTE — Progress Notes (Signed)
Occupational Therapy Treatment ?Patient Details ?Name: [name] ?MRN: [id number] ?DOB: 07/17/1946 ?Today's Date: 12/17/2021 ? ? ?History of present illness The pt is a 76 yo female presenting 3/17 from her home in a retirement community after sustaining a fall getting out of bed. Upon arrival, pt also noted to have AMS, hypoxia, elevated LFT and BNP. Pt transferred to [facility] 3/18 for continued HF evaluation.  Plan for need RHC and TEE once more diuresed. PMH includes: COPD, CHF, anxiety, arthritis, CAD s/p ICA and LAD stenting and CABG in 2008, CKD III, NSTEMI, PVT, stroke, and bradycardia. ?  ?OT comments ? Pt making good progress with OT goals. She tolerated increased ambulation and standing tasks this session. Overall she presents this session at a min guard level for safety, however OT anticipates that at this time she will continue to need some assist with LB ADL's. Continuing to recommend SNF level therapies, however as pt continues to progress she may be able to go home with home health therapies.   ? ?Recommendations for follow up therapy are one component of a multi-disciplinary discharge planning process, led by the attending physician.  Recommendations may be updated based on patient status, additional functional criteria and insurance authorization. ?   ?Follow Up Recommendations ? Skilled nursing-short term rehab (<3 hours/day) (progressing towards hopeful HH therapies at home)  ?  ?Assistance Recommended at Discharge Frequent or constant Supervision/Assistance  ?Patient can return home with the following ? A little help with walking and/or transfers;A lot of help with bathing/dressing/bathroom;Direct supervision/assist for medications management ?  ?Equipment Recommendations ? None recommended by OT  ?  ?Recommendations for Other Services   ? ?  ?Precautions / Restrictions Precautions ?Precautions: Fall ?Precaution Comments: x2 falls PTA ?Restrictions ?Weight Bearing Restrictions: No  ? ? ?   ? ?Mobility Bed Mobility ?Overal bed mobility: Needs Assistance ?  ?  ?  ?  ?  ?  ?General bed mobility comments: pt OOB in chair at start and end of session ?  ? ?Transfers ?Overall transfer level: Needs assistance ?Equipment used: Rollator (4 wheels) ?Transfers: Sit to/from Stand ?Sit to Stand: Min guard ?  ?  ?  ?  ?  ?General transfer comment: minG to power up from recliner and low toilet ?  ?  ?Balance Overall balance assessment: History of Falls, Needs assistance ?Sitting-balance support: Feet supported, No upper extremity supported ?Sitting balance-Leahy Scale: Good ?Sitting balance - Comments: no assist given ?  ?Standing balance support: Bilateral upper extremity supported, During functional activity, Reliant on assistive device for balance ?Standing balance-Leahy Scale: Fair ?Standing balance comment: fair without UE support for static stance, BUE support for gait ?  ?  ?  ?  ?  ?  ?  ?  ?  ?  ?  ?   ? ?ADL either performed or assessed with clinical judgement  ? ?ADL Overall ADL's : Needs assistance/impaired ?  ?  ?Grooming: Wash/dry hands;Wash/dry face;Oral care;Applying deodorant;Supervision/safety;Standing ?Grooming Details (indicate cue type and reason): tolerating standing to complete all tasks for 5+ mins ?  ?  ?  ?  ?  ?  ?  ?  ?Toilet Transfer: Min guard;Ambulation ?Toilet Transfer Details (indicate cue type and reason): W/ rollator pt ambulated to the bathroom to complete toileting and grooming needs, min g for safety as pt presents with unsteady dynamic standing and weakness ?Toileting- Clothing Manipulation and Hygiene: Supervision/safety;Sitting/lateral lean ?Toileting - Clothing Manipulation Details (indicate cue type and reason): completed on  toilet, needed superivision due to line managemnt ?  ?  ?Functional mobility during ADLs: Min guard;Rollator (4 wheels) ?General ADL Comments: Pt tolerating increased activities today with improved balance and strength ?  ? ?Extremity/Trunk Assessment    ?  ?  ?  ?  ?  ? ?Vision   ?  ?  ?Perception   ?  ?Praxis   ?  ? ?Cognition Arousal/Alertness: Awake/alert ?Behavior During Therapy: [facility] for tasks assessed/performed ?Overall Cognitive Status: Impaired/Different from baseline ?Area of Impairment: Safety/judgement ?  ?  ?  ?  ?  ?  ?  ?  ?  ?  ?  ?  ?Safety/Judgement: Decreased awareness of deficits ?  ?  ?General Comments: pt with improved attention and understanding of safety this session. Remains adamant about returning home with slightly reduced insight to need for assist. ?  ?  ?   ?Exercises   ? ?  ?Shoulder Instructions   ? ? ?  ?General Comments VSS on RA at end of session. She started on 2L [place] 98%- remained 91% and above with activity on RA during session  ? ? ?Pertinent Vitals/ Pain       Pain Assessment ?Pain Assessment: Faces ?Faces Pain Scale: Hurts little more ?Pain Location: headache ?Pain Descriptors / Indicators: Headache ?Pain Intervention(s): Monitored during session ? ?Home Living   ?  ?  ?  ?  ?  ?  ?  ?  ?  ?  ?  ?  ?  ?  ?  ?  ?  ?  ? ?  ?Prior Functioning/Environment    ?  ?  ?  ?   ? ?Frequency ? Min 2X/week  ? ? ? ? ?  ?Progress Toward Goals ? ?OT Goals(current goals can now be found in the care plan section) ? Progress towards OT goals: Progressing toward goals ? ?Acute Rehab OT Goals ?Patient Stated Goal: To go home instead of rehab ?OT Goal Formulation: With patient ?Time For Goal Achievement: 12/27/21 ?Potential to Achieve Goals: Good ?ADL Goals ?Pt Will Perform Grooming: standing;with supervision ?Pt Will Perform Lower Body Bathing: with min assist ?Pt Will Perform Upper Body Dressing: with set-up ?Pt Will Transfer to Toilet: with supervision;ambulating ?Pt Will Perform Toileting - Clothing Manipulation and hygiene: with min guard assist;sitting/lateral leans ?Additional ADL Goal #1: Pt will progress unsupported sitting balance to good and toelrate 10 min of unsupported sitting during BUE activities in order to demonstrate improved  balance and activity tolerance needed to increase participation in self care.  ?Plan Discharge plan remains appropriate;Frequency remains appropriate   ? ?Co-evaluation ? ? ?   ?  ?  ?  ?  ? ?  ?AM-PAC OT "6 Clicks" Daily Activity     ?Outcome Measure ? ? Help from another person eating meals?: None ?Help from another person taking care of personal grooming?: A Little ?Help from another person toileting, which includes using toliet, bedpan, or urinal?: A Little ?Help from another person bathing (including washing, rinsing, drying)?: A Lot ?Help from another person to put on and taking off regular upper body clothing?: A Little ?Help from another person to put on and taking off regular lower body clothing?: A Lot ?6 Click Score: 17 ? ?  ?End of Session Equipment Utilized During Treatment: Rollator (4 wheels);Oxygen ? ?OT Visit Diagnosis: Unsteadiness on feet (R26.81);History of falling (Z91.81);Repeated falls (R29.6);Muscle weakness (generalized) (M62.81);Low vision, both eyes (H54.2);Pain ?  ?Activity Tolerance Patient tolerated treatment well ?  ?  Patient Left in chair;with call bell/phone within reach ?  ?Nurse Communication Mobility status ?  ? ?   ? ?Time: 9476-5465 ?OT Time Calculation (min): 24 min ? ?Charges: OT General Charges ?$OT Visit: 1 Visit ?OT Treatments ?$Self Care/Home Management : 23-37 mins ? ?Latyra Jaye H., OTR/L ?Acute Rehabilitation ? ?Bonnie Roig Elane Yolanda Bonine ?12/17/2021, 4:24 PM ?

## 2021-12-17 NOTE — Plan of Care (Signed)

## 2021-12-17 NOTE — Consult Note (Signed)
? ?                                                                                ?Consultation Note ?Date: 12/17/2021  ? ?Patient Name: [name]  ?DOB: Apr 25, 1946  MRN: [id number]  Age / Sex: 76 y.o., female  ?PCP: [facility] ?Referring Physician: Little Ishikawa, MD ? ?Reason for Consultation: Establishing goals of care ? ?HPI/Patient Profile: 76 y.o. female  with past medical history of chronic kidney disease stage IIIa, COPD, atrial fibrillation on anticoagulation, sick sinus syndrome status post pacemaker, restless leg syndrome, CAD s/p CABG, chronic RV failure, admitted on 12/11/2021 with fall and head injury due to feeling "loopy" after discharge from [facility] on 3/15. ? ?Patient's labs in ED revealed elevated Cr, LFTs, BNP, and possible infection on UA. Was transferred to [facility] for HF consultation and new echo showed acute exabervation of RV HF with severe TR. Patient does not desire surgical intervention. PMT has been consulted to assist with goals of care conversation. ? ?Clinical Assessment and Goals of Care: ? ?I have reviewed medical records including EPIC notes, labs and imaging, received report from RN, assessed the patient and then called patient's son Elta Guadeloupe and daughter-in-law Darol Destine to discuss diagnosis prognosis, GOC, EOL wishes, disposition and options. ? ?I introduced Palliative Medicine as specialized medical care for people living with serious illness. It focuses on providing relief from the symptoms and stress of a serious illness. The goal is to improve quality of life for both the patient and the family. ? ?We discussed a brief life review of the patient and then focused on their current illness. I attempted to elicit values and goals of care important to the patient.   ? ?Medical History Review and Understanding: ?We reviewed patient's acute heart failure, long-standing chronic illnesses and frailty, and severe TR for which she does not want surgical  intervention. Discussed the unpredictable nature of accompanying symptoms. ? ?Social History: ?Patient has two sons and lives at [facility] for the past 6 months, reports moving there after a fall. ? ?Functional and Nutritional State: ?Patient has recurrent falls per family and she tells me she is able to feed herself, but is uncertain of whether she can dress herself as required by ALF to return there. ? ?Advance Directives: ?A detailed discussion regarding advanced directives was had. ? ?Code Status: ?Concepts specific to code status, artifical feeding and hydration, and rehospitalization were considered and discussed. ? ?Discussion: ?Patient is quite tired during our conversation. She is very clear that she does not want surgical intervention and would not want to return to the hospital after discharge. Discussed palliative care vs hospice and she initially states "I can't do that." After further discussion, she decides she would like hospice involvement. I reviewed hospice philosophy and end-of-life support, reassuring patient's family that this insurance benefit can be renewed every 6 months as required if patient continues to benefit with poor prognosis due to her illnesses. Patient's daughter-in-law shares family's concern that patient is still able to walk and has more falls when she takes mind-altering substances. Provided reassurance that these medications are started on an as-needed basis, with ongoing conversation based  on patient's needs as her natural disease process progresses.   ? ? ?The difference between aggressive medical intervention and comfort care was considered in light of the patient's goals of care. Hospice and Palliative Care services outpatient were explained and offered.  ? ?Discussed the importance of continued conversation with family and the medical providers regarding overall plan of care and treatment options, ensuring decisions are within the context of the patient?s values and GOCs.   ? ?Questions and concerns were addressed. The family was encouraged to call with questions or concerns.  PMT will continue to support holistically.  ? ?  ? ?SUMMARY OF RECOMMENDATIONS   ?-DNR ?-Continue current care while admitted, consult TOC for referral to hospice at ALF ?-Patient strongly desires to return to ALF if they will accept her with hospice support ?-PMT will continue to follow ? ? ?Prognosis:  ?Unable to determine ? ?Discharge Planning: To Be Determined  ? ?  ? ?Primary Diagnoses: ?Present on Admission: ? AKI (acute kidney injury) ([place]) ? Acute lower UTI ? CAD (coronary artery disease) S/P CABG ? Chronic kidney disease, stage III (moderate) (HCC) ? COPD (chronic obstructive pulmonary disease) ([place]) ? Essential hypertension ? Hyperlipidemia ? Hypothyroid ? Sinus node dysfunction (HCC) ? Elevated LFTs ? DNR (do not resuscitate) ? ? ?I have reviewed the medical record, interviewed the patient and family, and examined the patient. The following aspects are pertinent. ? ?Past Medical History:  ?Diagnosis Date  ? Anxiety   ? Arthritis   ? "all over" (07/29/2017)  ? Bulging disc   ? "still have one in my neck" (07/29/2017)  ? Carotid arterial disease ([place])   ? a. s/p L intracranial ICA stenting ([place]).  ? Cataract   ? "left eye; didn't take it out cause of macular degeneration" (07/29/2017)  ? Cervical cancer (HCC)   ? hx  ? Chronic kidney disease, stage III (moderate) (HCC)   ? "only have 1 working kidney" (07/29/2017)  ? COPD (chronic obstructive pulmonary disease) ([place])   ? a. quit smoking in 2007.  ? Coronary artery disease   ? a. 2007 s/p MI and LAD stenting;  10/2006 CABG x 4: LIMA->LAD, VG->OM->dLCX, VG->RPL;  c. 07/2013 MV: no ischemia.  ? Depression   ? Esophageal reflux   ? Hepatitis 1968  ? "don't remember which one"  ? History of blood transfusion 10/29/2006  ? "related to bypass OR"  ? History of echocardiogram   ? a. 01/2012 Echo: EF 55%, mildly dil LA/RV, mild MR, Ao Sclerosis, mod TR.  ?  Hypothyroid   ? Insomnia   ? Macular degeneration   ? "left eye" (07/29/2017)  ? Mixed hyperlipidemia   ? NSTEMI (non-ST elevated myocardial infarction) ([place]) 08/2006; 10/2006  ? Osteoporosis   ? Pneumonia   ? "several times before I quit smoking" (07/29/2017)  ? Presence of permanent cardiac pacemaker   ? PVT (paroxysmal ventricular tachycardia)   ? Restless leg syndrome   ? Stroke [facility]) ?2008  ? denies residual on 07/29/2017  ? Symptomatic bradycardia   ? a. s/p BSX [employer] dual chamber PPM Beckie Salts).  ? Syncope and collapse   ? Unspecified hearing loss   ? ?Social History  ? ?Socioeconomic History  ? Marital status: Widowed  ?  Spouse name: Not on file  ? Number of children: 2  ? Years of education: Not on file  ? Highest education level: Some college, no degree  ?Occupational History  ? Occupation: retired  ?  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 Smoking status: Former  ?  Packs/day: 0.75  ?  Years: 40.00  ?  Pack years: 30.00  ?  Types: Cigarettes  ?  Start date: 09/28/1965  ?  Quit date: 05/27/2006  ?  Years since quitting: 15.5  ? Smokeless tobacco: Never  ?Vaping Use  ? Vaping Use: Never used  ?Substance and Sexual Activity  ? Alcohol use: Yes  ?  Alcohol/week: 1.0 standard drink  ?  Types: 1 Glasses of wine per week  ? Drug use: No  ? Sexual activity: Not Currently  ?Other Topics Concern  ? Not on file  ?Social History Narrative  ? Pt is right handed   ? Lives alone in single story home within retirement community  ? Has 2 adult sons  ? Some college education  ? Last employment was with [employer]  ? ?Social Determinants of Health  ? ?Financial Resource Strain: Not on file  ?Food Insecurity: Not on file  ?Transportation Needs: Not on file  ?Physical Activity: Not on file  ?Stress: Not on file  ?Social Connections: Not on file  ? ?Family History  ?Problem Relation Age of Onset  ? Stomach cancer Mother   ? Diabetes Mother   ? Hyperlipidemia Mother   ? Hypertension Father   ? Lung cancer  Father   ? Alcohol abuse Father   ? Early death Brother   ? ?Scheduled Meds: ? acidophilus  1 capsule Oral Daily  ? amiodarone  200 mg Oral BID  ? apixaban  5 mg Oral BID  ? Chlorhexidine Gluconate Cloth  6 each Topical Da

## 2021-12-17 NOTE — TOC Progression Note (Addendum)
Transition of Care (TOC) - Progression Note  ? ? ?Patient Details  ?Name: [name] ?MRN: [id number] ?Date of Birth: [birth year] ? ?Transition of Care (TOC) CM/SW Contact  ?Chase Arnall, LCSW ?Phone Number: ?12/17/2021, 4:41 PM ? ?Clinical Narrative:    ?4:36pm - HF CSW reached out to [facility] [phone] and spoke with Judeen Hammans who reported that [name] can return to [place] with hospice as long as she is able to self-dress, feed and toilet independently than she can return. Judeen Hammans reported that she spoke with [name] son's yesterday and they are aware of this also. Sherry's direct phone is [phone] ? ?4:45pm- HF CSW spoke [name] and her daughter in Sports coach, [place] at bedside and offered choice for Hospice. Darol Destine and [name] reported preference for [facility] for home hospice. ?5:18pm - HF CSW called [facility] and requested a referral for [name]. Although it was after hours and CSW had to leave a voicemail for a callback. 5:28pm - HF CSW received a call back from [facility] and CSW was able to initiate referral for home with hospice. RNCM to fax H&P to [phone].  ? ?CSW will continue to follow throughout discharge.  ? ? ?Expected Discharge Plan: [place] ?Barriers to Discharge: Continued Medical Work up ? ?Expected Discharge Plan and Services ?Expected Discharge Plan: [place] ?  ?  ?  ?Living arrangements for the past 2 months: Apartment (Retirement home) ?                ?  ?  ?  ?  ?  ?  ?  ?  ?  ?  ? ? ?Social Determinants of Health (SDOH) Interventions ?  ? ?Readmission Risk Interventions ?   ? View : No data to display.  ?  ?  ?  ? ?[place], MSW, LCSW ?[phone] ?Heart Failure Social Worker  ?

## 2021-12-18 DIAGNOSIS — G40909 Epilepsy, unspecified, not intractable, without status epilepticus: Secondary | ICD-10-CM

## 2021-12-18 LAB — CBC
HCT: 39.8 % (ref 36.0–46.0)
Hemoglobin: 12.4 g/dL (ref 12.0–15.0)
MCH: 24.9 pg — ABNORMAL LOW (ref 26.0–34.0)
MCHC: 31.2 g/dL (ref 30.0–36.0)
MCV: 79.9 fL — ABNORMAL LOW (ref 80.0–100.0)
Platelets: 164 10*3/uL (ref 150–400)
RBC: 4.98 MIL/uL (ref 3.87–5.11)
RDW: 19.9 % — ABNORMAL HIGH (ref 11.5–15.5)
WBC: 9.3 10*3/uL (ref 4.0–10.5)
nRBC: 0 % (ref 0.0–0.2)

## 2021-12-18 LAB — COOXEMETRY PANEL
Carboxyhemoglobin: 2.2 % — ABNORMAL HIGH (ref 0.5–1.5)
Methemoglobin: <0.7 % (ref 0.0–1.5)
O2 Saturation: 69.3 %
Total hemoglobin: 12.9 g/dL (ref 12.0–16.0)

## 2021-12-18 LAB — BASIC METABOLIC PANEL
Anion gap: 10 (ref 5–15)
BUN: 27 mg/dL — ABNORMAL HIGH (ref 8–23)
CO2: 31 mmol/L (ref 22–32)
Calcium: 9.5 mg/dL (ref 8.9–10.3)
Chloride: 90 mmol/L — ABNORMAL LOW (ref 98–111)
Creatinine, Ser: 1.41 mg/dL — ABNORMAL HIGH (ref 0.44–1.00)
GFR, Estimated: 39 mL/min — ABNORMAL LOW (ref >60)
Glucose, Bld: 126 mg/dL — ABNORMAL HIGH (ref 70–99)
Potassium: 4.4 mmol/L (ref 3.5–5.1)
Sodium: 131 mmol/L — ABNORMAL LOW (ref 135–145)

## 2021-12-18 LAB — MAGNESIUM: Magnesium: 2.2 mg/dL (ref 1.7–2.4)

## 2021-12-18 LAB — GLUCOSE, CAPILLARY
Glucose-Capillary: 119 mg/dL — ABNORMAL HIGH (ref 70–99)
Glucose-Capillary: 144 mg/dL — ABNORMAL HIGH (ref 70–99)
Glucose-Capillary: 169 mg/dL — ABNORMAL HIGH (ref 70–99)

## 2021-12-18 MED ORDER — AMIODARONE HCL 200 MG PO TABS: 200.0000 mg | ORAL_TABLET | Freq: Every day | ORAL | 0 refills | Status: AC

## 2021-12-18 MED ORDER — SPIRONOLACTONE 25 MG PO TABS: 25.0000 mg | ORAL_TABLET | Freq: Every day | ORAL | 0 refills | Status: AC

## 2021-12-18 MED ORDER — AMIODARONE HCL 200 MG PO TABS
200.0000 mg | ORAL_TABLET | Freq: Every day | ORAL | Status: DC
Start: 2021-12-18 — End: 2021-12-18
  Administered 2021-12-18: 200 mg via ORAL
  Filled 2021-12-18: qty 1

## 2021-12-18 MED ORDER — TORSEMIDE 20 MG PO TABS: 60.0000 mg | ORAL_TABLET | Freq: Every day | ORAL | Status: DC

## 2021-12-18 MED ORDER — TORSEMIDE 40 MG PO TABS: 40.0000 mg | ORAL_TABLET | Freq: Every day | ORAL | 0 refills | Status: AC

## 2021-12-18 MED ORDER — PRAMIPEXOLE DIHYDROCHLORIDE 1 MG PO TABS: 1.0000 mg | ORAL_TABLET | Freq: Every day | ORAL | 0 refills | Status: AC

## 2021-12-18 MED ORDER — UMECLIDINIUM BROMIDE 62.5 MCG/ACT IN AEPB: 1.0000 | INHALATION_SPRAY | Freq: Every day | RESPIRATORY_TRACT | 0 refills | Status: AC

## 2021-12-18 MED ORDER — TORSEMIDE 20 MG PO TABS: 40.0000 mg | ORAL_TABLET | Freq: Every day | ORAL | Status: DC

## 2021-12-18 MED ORDER — FLUTICASONE FUROATE-VILANTEROL 100-25 MCG/ACT IN AEPB: 1.0000 | INHALATION_SPRAY | Freq: Every day | RESPIRATORY_TRACT | 0 refills | Status: AC

## 2021-12-18 NOTE — Progress Notes (Signed)
Mobility Specialist Progress Note: ? ? 12/17/21 1605  ?Mobility  ?Activity Ambulated with assistance in hallway  ?Level of Assistance Standby assist, set-up cues, supervision of patient - no hands on  ?Assistive Device Four wheel walker  ?Distance Ambulated (ft) 100 ft  ?Activity Response Tolerated well  ?$Mobility charge 1 Mobility  ? ?Pt agreeable to mobility session. Required no physical assist throughout. VSS on RA. Pt left in bed with all needs met.  ? ?Nelta Numbers ?Acute Rehab ?Phone: [phone] ?Office Phone: [phone] ? ?

## 2021-12-18 NOTE — Progress Notes (Addendum)
? ? Advanced Heart Failure Rounding Note ? ?PCP-Cardiologist: None  ? ?Subjective:   ?3/19 Continued on milrinone and started on lasix drip.  ?3/20 Started on po amio to suppress PVCs. PICC placed. Diuresed with lasix drip.  ?03/22 Milrinone decreased to 0.125. Lasix gtt stopped. IV lasix added back post cath d/t elevated filling pressures. ?3/23 Diuresed with IV lasix + metolazone. Improved diuresis . Weight down 2 pounds.  ? ?Feels better. Walked yesterday. Wants to get out of bed.  ? ?RHC on milrinone 0.125 ?RA = 19 (with v-waves to 30)  - looked like RV tracing  ?RV = 41/17 ?PA = 46/17 (31) ?PCW =24 (v waves to 34) ?Fick cardiac output/index = 4.5/2.5 ?PVR = 1.5 WU ?PAPi = 1.3 ?Ao sat = 97% ?PA sat = 59%, 60%  ?Assessment:  ?1. Mild pulmonary HTN ?2. Severe TR ?3. Moderately elevated left-sided pressures ? ? ?TEE ?FINDINGS: ?LEFT VENTRICLE: EF = 60%. No regional wall motion abnormalities. ?RIGHT VENTRICLE: Mildly dilated. Mildly HK (on milrinone 0.125) ?LEFT ATRIUM: Mildly dilated ?LEFT ATRIAL APPENDAGE: No thrombus.  ?RIGHT ATRIUM: Severely dilated ?AORTIC VALVE:  Trileaflet. Trivial AI ?MITRAL VALVE:    Normal. Mild to moderate MR ?TRICUSPID VALVE: Poor coaptation. Suspect component of pacer wire restriction. Wide open TR willing entire RA ?PULMONIC VALVE: Moderate PR  ?INTERATRIAL SEPTUM: No PFO or ASD. Septum bulges R to left ?PERICARDIUM: No effusion ?DESCENDING AORTA: Severe plaque ?  ?  ? ?Objective:   ?Weight Range: ?86.2 kg ?Body mass index is 35.91 kg/m?.  ? ?Vital Signs:   ?Temp:  [97.5 ?F (36.4 ?C)-97.8 ?F (36.6 ?C)] 97.6 ?F (36.4 ?C) (03/24 0400) ?Pulse Rate:  [60-104] 96 (03/24 0400) ?Resp:  [14-22] 15 (03/24 0400) ?BP: (111-123)/(48-93) 113/48 (03/24 0400) ?SpO2:  [90 %-98 %] 90 % (03/24 0400) ?Weight:  [86.2 kg] 86.2 kg (03/24 0400) ?Last BM Date : 12/17/21 ? ?Weight change: ?Filed Weights  ? 12/16/21 0450 12/17/21 0400 12/18/21 0400  ?Weight: 85.3 kg 87.4 kg 86.2 kg   ? ? ?Intake/Output:  ? ?Intake/Output Summary (Last 24 hours) at 12/18/2021 0735 ?Last data filed at 12/18/2021 0400 ?Gross per 24 hour  ?Intake 2207.98 ml  ?Output 2500 ml  ?Net -292.02 ml  ?  ? ? ?Physical Exam  ?CVP 6 ?General:  No resp difficulty ?HEENT: Ecchymotic R forehead/periorbital ?Neck: supple. no JVD. Carotids 2+ bilat; no bruits. No lymphadenopathy or thryomegaly appreciated. ?Cor: PMI nondisplaced. Regular rate & rhythm. No rubs, gallops. 3/6 TR  ?Lungs: clear ?Abdomen: soft, nontender, nondistended. No hepatosplenomegaly. No bruits or masses. Good bowel sounds. ?Extremities: no cyanosis, clubbing, rash, R and LLE unna boots. RUE PICC ?Neuro: alert & orientedx3, cranial nerves grossly intact. moves all 4 extremities w/o difficulty. Affect pleasant ? ? ? ?Telemetry  ? ?A paced 50-60s . Rare PVCs ? ? ?Labs  ?  ?CBC ?Recent Labs  ?  12/17/21 ?8185 12/18/21 ?0420  ?WBC 7.2 9.3  ?HGB 10.8* 12.4  ?HCT 34.1* 39.8  ?MCV 82.2 79.9*  ?PLT 157 164  ? ?Basic Metabolic Panel ?Recent Labs  ?  12/17/21 ?6314 12/17/21 ?1532 12/18/21 ?0420  ?NA 136 134* 131*  ?K 5.2* 5.1 4.4  ?CL 99 94* 90*  ?CO2 '30 30 31  '$ ?GLUCOSE 111* 133* 126*  ?BUN 29* 27* 27*  ?CREATININE 1.40* 1.40* 1.41*  ?CALCIUM 8.7* 9.4 9.5  ?MG 2.1  --  2.2  ? ?Liver Function Tests ?No results for input(s): AST, ALT, ALKPHOS, BILITOT, PROT, ALBUMIN in the last  72 hours. ? ?No results for input(s): LIPASE, AMYLASE in the last 72 hours. ?Cardiac Enzymes ?No results for input(s): CKTOTAL, CKMB, CKMBINDEX, TROPONINI in the last 72 hours. ? ?BNP: ?BNP (last 3 results) ?Recent Labs  ?  12/11/21 ?0653  ?BNP 1,055.0*  ? ? ?ProBNP (last 3 results) ?No results for input(s): PROBNP in the last 8760 hours. ? ? ?D-Dimer ?No results for input(s): DDIMER in the last 72 hours. ?Hemoglobin A1C ?Recent Labs  ?  12/16/21 ?0456  ?HGBA1C 7.0*  ? ?Fasting Lipid Panel ?No results for input(s): CHOL, HDL, LDLCALC, TRIG, CHOLHDL, LDLDIRECT in the last 72 hours. ?Thyroid Function  Tests ?Recent Labs  ?  12/16/21 ?0456  ?TSH 7.399*  ? ? ?Other results: ? ? ?Imaging  ? ? ?No results found. ? ? ?Medications:   ? ? ?Scheduled Medications: ? acidophilus  1 capsule Oral Daily  ? amiodarone  200 mg Oral BID  ? apixaban  5 mg Oral BID  ? atorvastatin  40 mg Oral Daily  ? Chlorhexidine Gluconate Cloth  6 each Topical Daily  ? diclofenac Sodium  4 g Topical QID  ? fluticasone furoate-vilanterol  1 puff Inhalation Daily  ? And  ? umeclidinium bromide  1 puff Inhalation Daily  ? furosemide  80 mg Intravenous BID  ? gabapentin  300 mg Oral TID  ? Gerhardt's butt cream   Topical BID  ? insulin aspart  0-9 Units Subcutaneous TID WC  ? levothyroxine  75 mcg Oral Q0600  ? Oxcarbazepine  300 mg Oral BID  ? pantoprazole  40 mg Oral QHS  ? pramipexole  1 mg Oral QHS  ? sertraline  100 mg Oral Daily  ? sodium chloride flush  10-40 mL Intracatheter Q12H  ? spironolactone  25 mg Oral Daily  ? ? ?Infusions: ? milrinone 0.125 mcg/kg/min (12/18/21 0005)  ? ? ?PRN Medications: ?acetaminophen **OR** acetaminophen, acetaminophen, ondansetron **OR** ondansetron (ZOFRAN) IV, polyvinyl alcohol, sodium chloride flush ? ? ? ?Patient Profile  ? ?  ?[name] is a 76 y.o. 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 Lovena Le 2014), chronic diastolic HF.   ? ?Transferred from [facility] to [facility] for HF consultation.   ? ?Assessment/Plan  ?1. 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 D-shaped septum. No significant PAH with TR tracing ?- suspect primary issue here is RV dilation with severe TR either due to longstanding severe PAH in setting of OSA/OHS/COPD (but PAH mild on previous echos dating back to 2013) or leaflet tethering from pacer leads ?- RHC: RA 19 ( v 30), PA 46/17 (31), PCWP 24 (v waves 34), Fick CO/CI 4.5/2.5, PAPi 1.3, PA sat 59%/60% ?-  TEE: EF 60%, RV mildly HK on mil 0.125, wide open TR with poor coaptation, suspect component of pacer wire restriction ?- Dr. Haroldine Laws discussed consideration of percutaneous TV replacement in [place] with patient and her son. Patient is adamant she does not want to proceed with this. Wishes to remain DNR. Palliative care consulted.  ?-CVP 6. Stop milrinone. Start torsemide 40 mg daily tomorrow.  ?- Continue spiro 25 mg daily for now.  ? ?2. CAD s/p CABG ?- no s/s angina ?- off ASA with DOAC. Off statin with shock liver ?  ?3. AKI on CKD 3a ?- baseline SCr 1.3-1.5 ?- has scarred R  kidney on u/s ?- SCr peaked 2.3-->1.1>1.4 ->1.4 today  ?- Likely due to ATN from shock, completed treatment for Klebsiella UTI with ceftriaxone ?- Continue hemodynamic support ?  ?4. 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  ?5. PAF with tachy-brady syndrome ?- in NSR ?- On apixaban 5 mg BID ? ?6. Fall with head trauma ?- Head CT negative for bleed ?  ?7. PVCs ?- High PVC burden. 3/20 Started on amio 200 mg twice a day to help suppress.  ?- PVC burden significantly lower.  ?- Cut back amio 200 mg daily ? ?8. Hyperkalemia ?- Resolved.  ? ?9. DNR/DNI ?- Palliative Care consulted for [place] discussion ?  ?TOC following for disposition. Plan for Hospice at discharge.  ? ?Length of Stay: 7 ? ?Darrick Grinder, NP  ?12/18/2021, 7:35 AM ? ?Advanced Heart Failure Team ?Pager [phone] (M-F; 7a - 5p)  ?Please contact [facility] for night-coverage after hours (5p -7a ) and weekends on [email or web address] ? ?Patient seen with NP, agree with the above note.  ? ?CVP down to 6, co-ox 69%.  Weight down 2 lbs.   ? ?Feels better, walked in hall.   ? ?General: NAD ?Neck: No JVD, no thyromegaly or thyroid nodule.  ?Lungs: Clear to auscultation bilaterally with normal respiratory effort. ?CV: Nondisplaced PMI.  Heart regular S1/S2, no S3/S4, 2/6 HSM LLSB.  Trace ankle edema.   ?Abdomen: Soft,  nontender, no hepatosplenomegaly, no distention.  ?Skin: Intact without lesions or rashes.  ?Neurologic: Alert and oriented x 3.  ?Psych: Normal affect. ?Extremities: No clubbing or cyanosis.  ?HEENT: Normal.  ? ?Patient with RV failure/

## 2021-12-18 NOTE — Discharge Summary (Signed)
Physician Discharge Summary  ?DEVINA BEZOLD ZOX:[id number] DOB: [birth year] DOA: 12/11/2021 ? ?PCP: [facility] ? ?Admit date: 12/11/2021 ?Discharge date: 12/18/2021 ? ?Admitted From: Assisted living ?Disposition: Same, with hospice ? ?Recommendations for Outpatient Follow-up:  ?Follow up with PCP in 1-2 weeks ?Follow-up with hospice as discussed ? ?Discharge Condition: Poor ?CODE STATUS: DNR ?Diet recommendation: As tolerated ? ?Brief/Interim Summary: ?76 y/o female admitted to the hospital after having a fall. She reported slipping and falling when she was reaching for her walker.  She felt as though she may have been somewhat sedated from pain medication she received earlier in the day.  Patient is chronically on Eliquis for atrial fibrillation.  Upon arrival to the emergency room, CT imaging did not indicate any fractures or intracranial hemorrhage.  She did have significant ecchymosis around her right orbit, right frontal scalp hematoma as well as some right conjunctival hemorrhage.  Vision remained intact.  Her work-up showed acute kidney injury, elevated LFTs, elevated BNP and cardiomegaly. Echocardiogram showed preserved ejection fraction with severely reduced RV function. Transferred from [facility] to [facility] for HF consultation.  HF team managing her cardiac issues.  ? ?Discharge Diagnoses:  ?Active Problems: ?  Congestive heart failure (CHF) ([place]) ?  AKI (acute kidney injury) ([place]) ?  Atrial fibrillation, chronic ([place]) ?  Cirrhosis ([place]) ?  Elevated LFTs ?  Acute lower UTI ?  COPD (chronic obstructive pulmonary disease) ([place]) ?  Chronic kidney disease, stage III (moderate) (HCC) ?  CAD (coronary artery disease) S/P CABG ?  Essential hypertension ?  Hyperlipidemia ?  Hypothyroid ?  Seizure disorder ([place]) ?  Sinus node dysfunction (HCC) ?  Pacemaker -[employer] ?  Fall at home, initial encounter ?  DNR (do not resuscitate) ?  Goals of care, counseling/discussion ? ?Discharge  Instructions ? ?Discharge Instructions   ? ? Discharge patient   Complete by: As directed ?  ? Discharge disposition: 03-Skilled Nursing Facility  ? Discharge patient date: 12/18/2021  ? ?  ? ?Allergies as of 12/18/2021   ? ?   Reactions  ? Iohexol Anaphylaxis  ?  Desc: PT HAD CONTRAST REACTION YRS AGO NEEDS 13 HR PREP FOR ALL CONTRAST STUDIES  ? Codeine   ? swelling  ? ?  ? ?  ?Medication List  ?  ? ?STOP taking these medications   ? ?furosemide 40 MG tablet ?Commonly known as: Lasix ?  ?isosorbide mononitrate 60 MG 24 hr tablet ?Commonly known as: IMDUR ?  ?nitroGLYCERIN 0.4 MG SL tablet ?Commonly known as: NITROSTAT ?  ?potassium chloride 10 MEQ tablet ?Commonly known as: KLOR-CON M ?  ?traZODone 50 MG tablet ?Commonly known as: DESYREL ?  ? ?  ? ?TAKE these medications   ? ?acetaminophen 325 MG tablet ?Commonly known as: TYLENOL ?Take 650 mg by mouth every 6 (six) hours as needed for moderate pain. ?  ?albuterol 108 (90 Base) MCG/ACT inhaler ?Commonly known as: VENTOLIN HFA ?Inhale 1-2 puffs into the lungs every 6 (six) hours as needed for wheezing or shortness of breath. ?  ?amiodarone 200 MG tablet ?Commonly known as: PACERONE ?Take 1 tablet (200 mg total) by mouth daily. ?Start taking on: December 19, 2021 ?  ?apixaban 5 MG Tabs tablet ?Commonly known as: Eliquis ?Take 1 tablet (5 mg total) by mouth 2 (two) times daily. ?  ?aspirin EC 81 MG tablet ?Take 81 mg by mouth every evening. Swallow whole. ?  ?atorvastatin 40 MG tablet ?Commonly known as: LIPITOR ?TAKE ONE (  1) TABLET BY MOUTH EACH DAY ?What changed: See the new instructions. ?  ?cetirizine 10 MG tablet ?Commonly known as: ZYRTEC ?Take 10 mg by mouth at bedtime. ?  ?fluticasone furoate-vilanterol 100-25 MCG/ACT Aepb ?Commonly known as: BREO ELLIPTA ?Inhale 1 puff into the lungs daily. ?Start taking on: December 19, 3084 ?  ?folic acid 1 MG tablet ?Commonly known as: FOLVITE ?Take 1 mg by mouth daily. ?  ?gabapentin 300 MG capsule ?Commonly known as:  NEURONTIN ?Take 300 mg by mouth 3 (three) times daily. ?  ?Iron 325 (65 Fe) MG Tabs ?Take by mouth. ?  ?lactobacillus acidophilus Tabs tablet ?Take 2 tablets by mouth 3 (three) times daily. ?  ?levothyroxine 75 MCG tablet ?Commonly known as: SYNTHROID ?Take 75 mcg by mouth every morning. ?  ?metoprolol tartrate 25 MG tablet ?Commonly known as: LOPRESSOR ?Take 25 mg by mouth 2 (two) times daily. ?  ?Oxcarbazepine 300 MG tablet ?Commonly known as: TRILEPTAL ?Take 1 tablet twice daily ?What changed:  ?how much to take ?how to take this ?when to take this ?additional instructions ?  ?pantoprazole 40 MG tablet ?Commonly known as: PROTONIX ?Take 40 mg by mouth at bedtime. ?  ?pramipexole 1 MG tablet ?Commonly known as: MIRAPEX ?Take 1 tablet (1 mg total) by mouth at bedtime. ?What changed:  ?medication strength ?how much to take ?  ?sertraline 100 MG tablet ?Commonly known as: ZOLOFT ?TAKE 1 TABLET BY MOUTH AT BEDTIME ?What changed: when to take this ?  ?spironolactone 25 MG tablet ?Commonly known as: ALDACTONE ?Take 1 tablet (25 mg total) by mouth daily. ?Start taking on: December 19, 2021 ?  ?Torsemide 40 MG Tabs ?Take 40 mg by mouth daily. ?Start taking on: December 19, 2021 ?  ?triamcinolone cream 0.1 % ?Commonly known as: KENALOG ?Apply 1 application. topically 2 (two) times daily. ?  ?umeclidinium bromide 62.5 MCG/ACT Aepb ?Commonly known as: INCRUSE ELLIPTA ?Inhale 1 puff into the lungs daily. ?Start taking on: December 19, 2021 ?  ? ?  ? ? Follow-up Information   ? ? [facility] Follow up.   ?Specialty: Cardiology ?Why: Call as needed ?Contact information: ?[address] ?[id number] mc ?[place] ?[phone] ? ?  ?  ? ?  ?  ? ?  ? ?Allergies  ?Allergen Reactions  ? Iohexol Anaphylaxis  ?   Desc: PT HAD CONTRAST REACTION YRS AGO NEEDS 13 HR PREP FOR ALL CONTRAST STUDIES ?  ? Codeine   ?  swelling   ? ? ?Consultations: ?Cardiology ? ?Procedures/Studies: ?CT Abdomen Pelvis Wo Contrast ? ?Result Date: 12/11/2021 ?CLINICAL DATA:  Fall, acute hepatitis. EXAM: CT ABDOMEN AND PELVIS WITHOUT CONTRAST TECHNIQUE: Multidetector CT imaging of the abdomen and pelvis was performed following the standard protocol without IV contrast. RADIATION DOSE REDUCTION: This exam was performed according to the departmental dose-optimization program which includes automated exposure control, adjustment of the mA and/or kV according to patient size and/or use of iterative reconstruction technique. COMPARISON:  Comparison made with January 24, 2014. FINDINGS: Lower chest: Small RIGHT effusion. Basilar atelectasis. No lobar consolidation. Heart size is enlarged.w pacer lead in the RIGHT heart. No pericardial effusion. Hepatobiliary: Nodular hepatic contour with fissural widening. Previously smooth and likely with steatosis on prior imaging. Trace perihepatic fluid.  Post cholecystectomy. Pancreas: Signs of pancreatic atrophy without gross peripancreatic inflammation. Spleen: Smooth contour of the spleen. No visible lesion or splenic enlargement. Adrenals/Urinary Tract: Thickening of the adrenal glands with stable  mild thickening of the LEFT adrenal dating back to April of 2015. RIGHT adrenal gland also with signs of thickening but increased since 2015 at 17 Hounsfield units measuring 1.8 x 1.4 cm in the body of the RIGHT adrenal gland. Renal cortical scarring which is greatest on the LEFT with moderate to marked scarring of the LEFT kidney showing similar configuration to exam from 2015. Small cyst in the upper pole the RIGHT kidney. No nephrolithiasis. No hydronephrosis. Mild perinephric stranding. No urinary bladder wall thickening or or substantial perivesical stranding at upper portion to other areas of generalized edema in the setting of small intra- Stomach/Bowel: Small hiatal hernia. No signs of perigastric stranding. No small bowel  obstruction. Normal appendix. No colonic thickening. Mild mesenteric edema in the setting of intra-abdominal ascites and suspected liver disease. Vascular/Lymphatic: Aortic atherosclerosis. No sign of aneurysm. Smooth contour of the IVC. There is no gastrohepatic or hepatoduodenal ligamen

## 2021-12-18 NOTE — TOC CM/SW Note (Addendum)
1245 pm HF TOC CM received call back from [facility] rep, [place]. States she did receive H&P and they will follow up with son or DIL for DME needed at home. Requested CM fax Dc summary to office and call her to make aware of dc time. They will be able to see her today if she dc home. Updated attending. Jonnie Finner RN3 CCM, Heart Failure TOC CM [phone]  ? ?HF TOC CM contacted [facility] to follow up on new referral. Faxed H&P, [phone]. Jonnie Finner RN3 CCM, Heart Failure TOC CM [phone]  ?  ?

## 2021-12-18 NOTE — Progress Notes (Signed)
?      ? ?  Palliative Medicine Progress Note  ? ?HPI: 76 y.o. female  with past medical history of chronic kidney disease stage IIIa, COPD, atrial fibrillation on anticoagulation, sick sinus syndrome status post pacemaker, restless leg syndrome, CAD s/p CABG, chronic RV failure, admitted on 12/11/2021 with fall and head injury due to feeling "loopy" after discharge from [facility] on 3/15. ?  ?Patient's labs in ED revealed elevated Cr, LFTs, BNP, and possible infection on UA. Was transferred to [facility] for HF consultation and new echo showed acute exabervation of RV HF with severe TR. Patient does not desire surgical intervention. PMT has been consulted to assist with goals of care conversation. ? ?Subjective: Medical records reviewed including progress notes, labs, imaging. Patient assessed at the bedside. She is resting and wakes easily to my voice, reports she was able to walk yesterday and looks forward to walking again this afternoon. Remains tired. Confirms she has discussed hospice further with family and has decided to proceed. Family is supportive. ? ?Questions and concerns addressed. [employer] for [employer] booklet was provided for review. PMT will continue to support holistically. ? ?Physical Exam: ?General: frail, elderly F ?HEENT: bruising over right eye, scalp hematoma ?Pulmonary: normal breathing without respiratory distress ?Cardiac: regular rate and rhythm  ?Neuro: A&O x3 ? 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 ?Recommendations/Plan ?Continue supportive care, patient will continue to benefit from PT/OT efforts to reach goal of returning to [facility] ?Patient and family have chosen [facility] at discharge, awaiting approval  ?PMT remains available for acute needs ? ? ?MDM: [place] ? ?Dorthy Cooler, PA-C ?Palliative Medicine Team ?Team phone # [phone] ? ?Thank you for allowing the Palliative  Medicine Team to assist in the care of this patient. Please utilize secure chat with additional questions, if there is no response within 30 minutes please call the above phone number. ? ?Palliative Medicine Team providers are available by phone from 7am to 7pm daily and can be reached through the team cell phone.  ?Should this patient require assistance outside of these hours, please call the patient's attending physician.  ?

## 2021-12-18 NOTE — Progress Notes (Signed)
Physical Therapy Treatment ?Patient Details ?Name: [name] ?MRN: [id number] ?DOB: Jun 24, 1946 ?Today's Date: 12/18/2021 ? ? ?History of Present Illness The pt is a 76 yo female presenting 3/17 from her home in a retirement community after sustaining a fall getting out of bed. Upon arrival, pt also noted to have AMS, hypoxia, elevated LFT and BNP. Pt transferred to [facility] 3/18 for continued HF evaluation.  Plan for need RHC and TEE once more diuresed. PMH includes: COPD, CHF, anxiety, arthritis, CAD s/p ICA and LAD stenting and CABG in 2008, CKD III, NSTEMI, PVT, stroke, and bradycardia. ? ?  ?PT Comments  ? ? The pt was agreeable to session with focus on progressing OOB mobility and endurance. The pt was able to complete longer bout of hallway ambulation, SpO2 remained stable on RA, but she does utilize slow and mildly unsteady gait pattern with heavy dependence on BUE support. The pt declined seated rest, and was able to advocate for needs and state when she needed to return to the room. The pt continues to need frequent supervision/assist to safely transfer and ambulate, but will be safe to return home with HHPT and needed supervision. Will continue to benefit from skilled PT for dynamic stability and safety awareness with mobility.  ?  ?Recommendations for follow up therapy are one component of a multi-disciplinary discharge planning process, led by the attending physician.  Recommendations may be updated based on patient status, additional functional criteria and insurance authorization. ? ?Follow Up Recommendations ? Home health PT ?  ?  ?Assistance Recommended at Discharge Frequent or constant Supervision/Assistance  ?Patient can return home with the following A little help with walking and/or transfers;A little help with bathing/dressing/bathroom;Assist for transportation;Help with stairs or ramp for entrance ?  ?Equipment Recommendations ? None recommended by PT  ?  ?Recommendations for Other  Services   ? ? ?  ?Precautions / Restrictions Precautions ?Precautions: Fall ?Precaution Comments: x2 falls PTA ?Restrictions ?Weight Bearing Restrictions: No  ?  ? ?Mobility ? Bed Mobility ?Overal bed mobility: Needs Assistance ?Bed Mobility: Supine to Sit ?  ?  ?Supine to sit: Supervision, HOB elevated ?  ?  ?General bed mobility comments: supervision, but significant increased time and use of bed rails ?  ? ?Transfers ?Overall transfer level: Needs assistance ?Equipment used: Rollator (4 wheels) ?Transfers: Sit to/from Stand ?Sit to Stand: Min guard ?  ?  ?  ?  ?  ?General transfer comment: minG to power up from recliner and low toilet ?  ? ?Ambulation/Gait ?Ambulation/Gait assistance: Min assist ?Gait Distance (Feet): 125 Feet (+ 15 ft) ?Assistive device: Rollator (4 wheels) ?Gait Pattern/deviations: Step-to pattern, Decreased stride length, Shuffle, Narrow base of support ?Gait velocity: decreased ?Gait velocity interpretation: <1.31 ft/sec, indicative of household ambulator ?  ?General Gait Details: pt with slow and unsteady gait, dependent on BUE support on rollator. no overt LOB and SpO2 remained in 90s on RA. ambulated to bathroom then hallway with no seated rest but progressive shaking in BLE ? ? ? ?  ?Balance Overall balance assessment: History of Falls, Needs assistance ?Sitting-balance support: Feet supported, No upper extremity supported ?Sitting balance-Leahy Scale: Good ?Sitting balance - Comments: no assist given ?  ?Standing balance support: Bilateral upper extremity supported, During functional activity, Reliant on assistive device for balance ?Standing balance-Leahy Scale: Fair ?Standing balance comment: fair without UE support for static stance, BUE support for gait ?  ?  ?  ?  ?  ?  ?  ?  ?  ?  ?  ?  ? ?  ?  Cognition Arousal/Alertness: Awake/alert ?Behavior During Therapy: [facility] for tasks assessed/performed ?Overall Cognitive Status: Impaired/Different from baseline ?Area of Impairment:  Safety/judgement ?  ?  ?  ?  ?  ?  ?  ?  ?  ?  ?  ?  ?Safety/Judgement: Decreased awareness of deficits ?  ?  ?General Comments: pt with improved attention, still needing assist with line management and sequencing movements. pt with slight deficit in memory stating she didnt recognize her room ?  ?  ? ?  ?Exercises   ? ?  ?General Comments General comments (skin integrity, edema, etc.): VSS on RA ?  ?  ? ?Pertinent Vitals/Pain Pain Assessment ?Pain Assessment: Faces ?Faces Pain Scale: Hurts even more ?Pain Location: headache, LE with BP measurement ?Pain Descriptors / Indicators: Headache ?Pain Intervention(s): Monitored during session  ? ? ? ?PT Goals (current goals can now be found in the care plan section) Acute Rehab PT Goals ?Patient Stated Goal: to return to retirement home ?PT Goal Formulation: With patient ?Time For Goal Achievement: 12/26/21 ?Potential to Achieve Goals: Good ?Progress towards PT goals: Progressing toward goals ? ?  ?Frequency ? ? ? Min 3X/week ? ? ? ?  ?PT Plan Current plan remains appropriate  ? ? ?   ?AM-PAC PT "6 Clicks" Mobility   ?Outcome Measure ? Help needed turning from your back to your side while in a flat bed without using bedrails?: A Little ?Help needed moving from lying on your back to sitting on the side of a flat bed without using bedrails?: A Little ?Help needed moving to and from a bed to a chair (including a wheelchair)?: A Little ?Help needed standing up from a chair using your arms (e.g., wheelchair or bedside chair)?: A Little ?Help needed to walk in hospital room?: A Little ?Help needed climbing 3-5 steps with a railing? : A Lot ?6 Click Score: 17 ? ?  ?End of Session Equipment Utilized During Treatment: Gait belt ?Activity Tolerance: Patient tolerated treatment well ?Patient left: with call bell/phone within reach;in chair;with nursing/sitter in room ?Nurse Communication: Mobility status ?PT Visit Diagnosis: Unsteadiness on feet (R26.81);Other abnormalities of gait  and mobility (R26.89);Muscle weakness (generalized) (M62.81);History of falling (Z91.81) ?  ? ? ?Time: 0626-9485 ?PT Time Calculation (min) (ACUTE ONLY): 23 min ? ?Charges:  $Therapeutic Exercise: 23-37 mins          ?          ? ?West Carbo, PT, DPT  ? ?Acute Rehabilitation Department ?Pager #: [phone] - [phone] ? ? ?Sandra Cockayne ?12/18/2021, 11:57 AM ? ?

## 2021-12-18 NOTE — Progress Notes (Signed)
Mobility Specialist Progress Note: ? ? 12/18/21 1320  ?Mobility  ?Activity Ambulated with assistance to bathroom  ?Level of Assistance Standby assist, set-up cues, supervision of patient - no hands on  ?Assistive Device Four wheel walker  ?Distance Ambulated (ft) 30 ft  ?Activity Response Tolerated well  ?$Mobility charge 1 Mobility  ? ?Pt requesting to get back in bed, however to BR first to void. No physical assistance required. Left pt in bed with all needs met. ? ?Nelta Numbers ?Acute Rehab ?Phone: [phone] ?Office Phone: [phone] ? ?

## 2021-12-18 NOTE — TOC Initial Note (Signed)
Transition of Care (TOC) - Initial/Assessment Note  ? ? ?Patient Details  ?Name: [name] ?MRN: [id number] ?Date of Birth: 12/26/1945 ? ?Transition of Care [facility]) CM/SW Contact:    ?Marcheta Grammes Rexene Alberts, RN ?Phone Number: [phone] ?12/18/2021, 3:53 PM ? ?Clinical Narrative:                 ?HF TOC CM spoke to pt and son, Elta Guadeloupe at bedside. Explained [facility] will have Hospice RN come this evening for initial visit. States she has RW, shower bench at home. Her apt is handicap accessible. Son and DIL will assist at home as needed. Faxed dc summary to [facility] rep, [place]. Fax #[phone].  ? ?Expected Discharge Plan: [place] ?Barriers to Discharge: Continued Medical Work up ? ? ?Patient Goals and CMS Choice ?Patient states their goals for this hospitalization and ongoing recovery are:: Home with Hospice ?CMS Medicare.gov Compare Post Acute Care list provided to:: Patient ?Choice offered to / list presented to : Patient ? ?Expected Discharge Plan and Services ?Expected Discharge Plan: [place] ?In-house Referral: Clinical Social Work ?Discharge Planning Services: CM Consult ?  ?Living arrangements for the past 2 months: [place] ?Expected Discharge Date: 12/18/21               ?  ?  ?  ?  ?  ?HH Arranged: RN ?[place] Agency: [facility] ?Date [place]: 12/18/21 ?Time [place]: [phone] ?Representative spoke with at [place]: Marchia Meiers ? ?Prior Living Arrangements/Services ?Living arrangements for the past 2 months: [place] ?Lives with:: Self ?Patient language and need for interpreter reviewed:: Yes ?Do you feel safe going back to the place where you live?: Yes      ?Need for Family Participation in Patient Care: Yes (Comment) ?Care giver support system in place?: Yes (comment) ?Current home services: DME ?Criminal Activity/Legal Involvement Pertinent to Current Situation/Hospitalization: No - Comment as  needed ? ?Activities of Daily Living ?Home Assistive Devices/Equipment: None ?ADL Screening (condition at time of admission) ?Patient's cognitive ability adequate to safely complete daily activities?: Yes ?Is the patient deaf or have difficulty hearing?: Yes ?Does the patient have difficulty seeing, even when wearing glasses/contacts?: No ?Does the patient have difficulty concentrating, remembering, or making decisions?: No ?Patient able to express need for assistance with ADLs?: Yes ?Does the patient have difficulty dressing or bathing?: No ?Independently performs ADLs?: Yes (appropriate for developmental age) ?Does the patient have difficulty walking or climbing stairs?: No ?Weakness of Legs: Both ?Weakness of Arms/Hands: None ? ?Permission Sought/Granted ?Permission sought to share information with : Case Manager, Family Supports, PCP ?Permission granted to share information with : Yes, Verbal Permission Granted ? Share Information with NAME: Marjie Skiff ? Permission granted to share info w AGENCY: [facility] ? Permission granted to share info w Relationship: son, dtr-in-law ? Permission granted to share info w Contact Information: [phone] ? ?Emotional Assessment ?Appearance:: Appears stated age ?Attitude/Demeanor/Rapport: Engaged ?Affect (typically observed): Accepting ?Orientation: : Oriented to Self, Oriented to Place, Oriented to  Time, Oriented to Situation ?Alcohol / Substance Use: Not Applicable ?Psych Involvement: No (comment) ? ?Admission diagnosis:  Elevated LFTs [R79.89] ?AKI (acute kidney injury) ([place]) [N17.9] ?Facial contusion, initial encounter [S00.83XA] ?Subconjunctival hemorrhage of right eye [H11.31] ?Acute on chronic congestive heart failure, unspecified heart failure type ([place]) [I50.9] ?Patient Active Problem List  ? Diagnosis Date Noted  ? Goals of care, counseling/discussion   ?  AKI (acute kidney injury) ([place]) 12/11/2021  ? Fall at home, initial encounter 12/11/2021  ? Seizure  disorder ([place]) 12/11/2021  ? Atrial fibrillation, chronic ([place]) 12/11/2021  ? Elevated LFTs 12/11/2021  ? Cirrhosis ([place]) 12/11/2021  ? DNR (do not resuscitate) 12/11/2021  ? Congestive heart failure (CHF) ([place]) 12/11/2021  ? Morbid obesity ([place]) 01/31/2018  ? Syncope 07/28/2017  ? Chronic kidney disease, stage III (moderate) ([place]) 07/28/2017  ? COPD (chronic obstructive pulmonary disease) ([place]) 07/28/2017  ? Pulmonary hypertension ([place]) 07/28/2017  ? Chronic diastolic heart failure, NYHA class 1 ([place]) 07/28/2017  ? Coronary artery disease 07/28/2017  ? Mixed hyperlipidemia 07/28/2017  ? Hypothyroid 07/28/2017  ? History of PVT (paroxysmal ventricular tachycardia) ([place]) 07/28/2017  ? Symptomatic bradycardia 07/28/2017  ? Restless leg syndrome 07/28/2017  ? Acute lower UTI 04/15/2017  ? Syncope and collapse 04/15/2017  ? CKD (chronic kidney disease) stage 3, GFR 30-59 ml/min (HCC) 04/15/2017  ? CAD (coronary artery disease) S/P CABG 04/14/2017  ? Sinus node dysfunction ([place]) 08/21/2013  ? Atrial tachycardia ([place]) 08/21/2013  ? Pacemaker -[employer] 08/21/2013  ? Coronary atherosclerosis 11/21/2009  ? GASTRIC ULCER 01/21/2009  ? DIARRHEA, CHRONIC 12/31/2008  ? IRON DEFICIENCY 11/28/2008  ? LEG EDEMA 11/19/2008  ? Chronic respiratory failure with hypoxia ([place]) 11/19/2008  ? Angina pectoris ([place]) 11/19/2008  ? HEADACHE 08/19/2008  ? MACULAR DEGENERATION 02/20/2008  ? DISORDER, TOBACCO USE 02/09/2008  ? CAROTID ARTERY DISEASE 02/13/2007  ? TRANSIENT ISCHEMIC ATTACK 02/13/2007  ? INSOMNIA 02/13/2007  ? Restless legs syndrome (RLS) 01/27/2007  ? OSTEOPENIA 01/13/2007  ? ALKALINE PHOSPHATASE, ELEVATED 11/11/2006  ? HEMORRHAGE, CONJUNCTIVAL 11/04/2006  ? MALAISE AND FATIGUE 11/04/2006  ? CERVICAL CANCER 10/11/2006  ? Hyperlipidemia 10/11/2006  ? Anxiety 10/11/2006  ? Depression 10/11/2006  ? Essential hypertension 10/11/2006  ? MYOCARDIAL INFARCTION, HX OF 10/11/2006  ? GERD 10/11/2006  ? OSTEOARTHRITIS 10/11/2006  ?  LOW BACK PAIN 10/11/2006  ? ?PCP:  [facility] ?Pharmacy:   ?[id number] [place] [place] - [id number]?Phone: [phone]: [phone]? ? ? ? ?Social Determinants of Health (SDOH) Interventions ?Food Insecurity Interventions: Intervention Not Indicated ?Financial Strain Interventions: Intervention Not Indicated ?Housing Interventions: Intervention Not Indicated ?Transportation Interventions: Intervention Not Indicated ? ?Readmission Risk Interventions ?   ? View : No data to display.  ?  ?  ?  ? ? ? ?

## 2021-12-18 NOTE — Progress Notes (Signed)
Discharge instructions given. Patient verbalized understanding and all questions were answered.  ?

## 2021-12-20 ENCOUNTER — Encounter (HOSPITAL_COMMUNITY): Payer: Self-pay | Admitting: Internal Medicine

## 2021-12-31 ENCOUNTER — Ambulatory Visit (INDEPENDENT_AMBULATORY_CARE_PROVIDER_SITE_OTHER): Payer: Medicare Other

## 2021-12-31 DIAGNOSIS — I495 Sick sinus syndrome: Secondary | ICD-10-CM

## 2021-12-31 LAB — CUP PACEART REMOTE DEVICE CHECK
Battery Remaining Longevity: 126 mo
Battery Remaining Percentage: 100 %
Brady Statistic RA Percent Paced: 3 %
Brady Statistic RV Percent Paced: 1 %
Date Time Interrogation Session: 20230406034100
Implantable Lead Implant Date: 20130611
Implantable Lead Implant Date: 20130611
Implantable Lead Location: 753859
Implantable Lead Location: 753860
Implantable Lead Model: 4469
Implantable Lead Model: 5076
Implantable Lead Serial Number: 566580
Implantable Pulse Generator Implant Date: 20221003
Lead Channel Impedance Value: 460 Ohm
Lead Channel Impedance Value: 593 Ohm
Lead Channel Pacing Threshold Amplitude: 1.4 V
Lead Channel Pacing Threshold Pulse Width: 0.4 ms
Lead Channel Setting Pacing Amplitude: 1.9 V
Lead Channel Setting Pacing Amplitude: 5 V
Lead Channel Setting Pacing Pulse Width: 0.4 ms
Lead Channel Setting Sensing Sensitivity: 2 mV
Pulse Gen Serial Number: 541763

## 2022-01-15 NOTE — Progress Notes (Signed)
Remote pacemaker transmission.   

## 2022-04-01 ENCOUNTER — Ambulatory Visit (INDEPENDENT_AMBULATORY_CARE_PROVIDER_SITE_OTHER): Payer: Medicare Other

## 2022-04-01 DIAGNOSIS — I495 Sick sinus syndrome: Secondary | ICD-10-CM

## 2022-04-06 LAB — CUP PACEART REMOTE DEVICE CHECK
Battery Remaining Longevity: 162 mo
Battery Remaining Percentage: 100 %
Brady Statistic RA Percent Paced: 15 %
Brady Statistic RV Percent Paced: 1 %
Date Time Interrogation Session: 20230706034100
Implantable Lead Implant Date: 20130611
Implantable Lead Implant Date: 20130611
Implantable Lead Location: 753859
Implantable Lead Location: 753860
Implantable Lead Model: 4469
Implantable Lead Model: 5076
Implantable Lead Serial Number: 566580
Implantable Pulse Generator Implant Date: 20221003
Lead Channel Impedance Value: 405 Ohm
Lead Channel Impedance Value: 559 Ohm
Lead Channel Pacing Threshold Amplitude: 0.4 V
Lead Channel Pacing Threshold Amplitude: 1.7 V
Lead Channel Pacing Threshold Pulse Width: 0.4 ms
Lead Channel Pacing Threshold Pulse Width: 0.4 ms
Lead Channel Setting Pacing Amplitude: 2 V
Lead Channel Setting Pacing Amplitude: 2.1 V
Lead Channel Setting Pacing Pulse Width: 0.4 ms
Lead Channel Setting Sensing Sensitivity: 2 mV
Pulse Gen Serial Number: 541763

## 2022-04-20 NOTE — Progress Notes (Signed)
Remote pacemaker transmission.   

## 2022-07-01 ENCOUNTER — Ambulatory Visit (INDEPENDENT_AMBULATORY_CARE_PROVIDER_SITE_OTHER)

## 2022-07-01 DIAGNOSIS — I495 Sick sinus syndrome: Secondary | ICD-10-CM | POA: Diagnosis not present

## 2022-07-02 LAB — CUP PACEART REMOTE DEVICE CHECK
Battery Remaining Longevity: 162 mo
Battery Remaining Percentage: 100 %
Brady Statistic RA Percent Paced: 31 %
Brady Statistic RV Percent Paced: 2 %
Date Time Interrogation Session: 20231006035300
Implantable Lead Implant Date: 20130611
Implantable Lead Implant Date: 20130611
Implantable Lead Location: 753859
Implantable Lead Location: 753860
Implantable Lead Model: 4469
Implantable Lead Model: 5076
Implantable Lead Serial Number: 566580
Implantable Pulse Generator Implant Date: 20221003
Lead Channel Impedance Value: 403 Ohm
Lead Channel Impedance Value: 536 Ohm
Lead Channel Pacing Threshold Amplitude: 0.4 V
Lead Channel Pacing Threshold Amplitude: 1.6 V
Lead Channel Pacing Threshold Pulse Width: 0.4 ms
Lead Channel Pacing Threshold Pulse Width: 0.4 ms
Lead Channel Setting Pacing Amplitude: 1.9 V
Lead Channel Setting Pacing Amplitude: 2 V
Lead Channel Setting Pacing Pulse Width: 0.4 ms
Lead Channel Setting Sensing Sensitivity: 2 mV
Pulse Gen Serial Number: 541763

## 2022-07-08 NOTE — Progress Notes (Signed)
Remote pacemaker transmission.   

## 2022-09-27 DEATH — deceased
